# Patient Record
Sex: Female | Born: 1940 | Race: White | Hispanic: No | Marital: Married | Smoking: Never smoker
Health system: Southern US, Community
[De-identification: ages and names within clinical notes are randomized; demographics above are authoritative.]

## PROBLEM LIST (undated history)

## (undated) ENCOUNTER — Emergency Department (HOSPITAL_BASED_OUTPATIENT_CLINIC_OR_DEPARTMENT_OTHER): Admission: EM | Payer: Medicare Other | Source: Home / Self Care

## (undated) DIAGNOSIS — E7800 Pure hypercholesterolemia, unspecified: Secondary | ICD-10-CM

## (undated) DIAGNOSIS — R269 Unspecified abnormalities of gait and mobility: Secondary | ICD-10-CM

## (undated) DIAGNOSIS — E039 Hypothyroidism, unspecified: Secondary | ICD-10-CM

## (undated) DIAGNOSIS — I10 Essential (primary) hypertension: Secondary | ICD-10-CM

## (undated) DIAGNOSIS — R413 Other amnesia: Secondary | ICD-10-CM

## (undated) DIAGNOSIS — F324 Major depressive disorder, single episode, in partial remission: Secondary | ICD-10-CM

## (undated) DIAGNOSIS — M1990 Unspecified osteoarthritis, unspecified site: Secondary | ICD-10-CM

## (undated) DIAGNOSIS — F419 Anxiety disorder, unspecified: Secondary | ICD-10-CM

## (undated) HISTORY — PX: PARATHYROIDECTOMY: SHX19

---

## 1997-12-02 ENCOUNTER — Ambulatory Visit (HOSPITAL_COMMUNITY): Admission: RE | Admit: 1997-12-02 | Discharge: 1997-12-02 | Payer: Self-pay | Admitting: Obstetrics and Gynecology

## 1998-06-21 ENCOUNTER — Ambulatory Visit (HOSPITAL_COMMUNITY): Admission: RE | Admit: 1998-06-21 | Discharge: 1998-06-21 | Payer: Self-pay | Admitting: Obstetrics and Gynecology

## 1998-06-21 ENCOUNTER — Encounter: Payer: Self-pay | Admitting: Obstetrics and Gynecology

## 1999-07-28 ENCOUNTER — Ambulatory Visit (HOSPITAL_COMMUNITY): Admission: RE | Admit: 1999-07-28 | Discharge: 1999-07-28 | Payer: Self-pay | Admitting: Obstetrics and Gynecology

## 1999-07-28 ENCOUNTER — Encounter: Payer: Self-pay | Admitting: Obstetrics and Gynecology

## 1999-11-02 ENCOUNTER — Emergency Department (HOSPITAL_COMMUNITY): Admission: EM | Admit: 1999-11-02 | Discharge: 1999-11-02 | Payer: Self-pay | Admitting: Emergency Medicine

## 2000-06-27 ENCOUNTER — Ambulatory Visit (HOSPITAL_COMMUNITY): Admission: RE | Admit: 2000-06-27 | Discharge: 2000-06-27 | Payer: Self-pay | Admitting: *Deleted

## 2000-06-27 ENCOUNTER — Encounter: Payer: Self-pay | Admitting: Rheumatology

## 2001-01-01 ENCOUNTER — Ambulatory Visit (HOSPITAL_COMMUNITY): Admission: RE | Admit: 2001-01-01 | Discharge: 2001-01-01 | Payer: Self-pay | Admitting: Obstetrics and Gynecology

## 2001-01-01 ENCOUNTER — Encounter: Payer: Self-pay | Admitting: Obstetrics and Gynecology

## 2001-04-22 ENCOUNTER — Ambulatory Visit (HOSPITAL_COMMUNITY): Admission: RE | Admit: 2001-04-22 | Discharge: 2001-04-22 | Payer: Self-pay | Admitting: Gastroenterology

## 2002-02-28 ENCOUNTER — Encounter: Payer: Self-pay | Admitting: Obstetrics and Gynecology

## 2002-02-28 ENCOUNTER — Ambulatory Visit (HOSPITAL_COMMUNITY): Admission: RE | Admit: 2002-02-28 | Discharge: 2002-02-28 | Payer: Self-pay | Admitting: Gynecology

## 2004-06-17 ENCOUNTER — Other Ambulatory Visit: Admission: RE | Admit: 2004-06-17 | Discharge: 2004-06-17 | Payer: Self-pay | Admitting: Obstetrics and Gynecology

## 2005-10-10 ENCOUNTER — Other Ambulatory Visit: Admission: RE | Admit: 2005-10-10 | Discharge: 2005-10-10 | Payer: Self-pay | Admitting: Obstetrics and Gynecology

## 2006-11-12 ENCOUNTER — Other Ambulatory Visit: Admission: RE | Admit: 2006-11-12 | Discharge: 2006-11-12 | Payer: Self-pay | Admitting: Obstetrics and Gynecology

## 2008-12-23 ENCOUNTER — Ambulatory Visit (HOSPITAL_BASED_OUTPATIENT_CLINIC_OR_DEPARTMENT_OTHER): Admission: RE | Admit: 2008-12-23 | Discharge: 2008-12-23 | Payer: Self-pay | Admitting: Family Medicine

## 2008-12-23 ENCOUNTER — Ambulatory Visit: Payer: Self-pay | Admitting: Diagnostic Radiology

## 2009-09-01 ENCOUNTER — Ambulatory Visit: Payer: Self-pay | Admitting: Diagnostic Radiology

## 2009-09-01 ENCOUNTER — Emergency Department (HOSPITAL_BASED_OUTPATIENT_CLINIC_OR_DEPARTMENT_OTHER): Admission: EM | Admit: 2009-09-01 | Discharge: 2009-09-01 | Payer: Self-pay | Admitting: Emergency Medicine

## 2010-08-25 ENCOUNTER — Ambulatory Visit: Payer: Self-pay | Admitting: Psychology

## 2010-09-02 ENCOUNTER — Ambulatory Visit: Payer: Self-pay | Admitting: Psychology

## 2010-09-09 ENCOUNTER — Ambulatory Visit: Payer: Self-pay | Admitting: Psychology

## 2010-09-15 ENCOUNTER — Ambulatory Visit: Payer: Self-pay | Admitting: Psychology

## 2010-10-04 ENCOUNTER — Ambulatory Visit: Admit: 2010-10-04 | Payer: Self-pay | Admitting: Psychology

## 2011-02-10 NOTE — Procedures (Signed)
[facility]  Patient:    Tiffany Gill, Tiffany Gill                       MRN: [id number] Proc. Date: 04/22/01 Attending:  Fayrene Fearing L. Randa Evens, M.D. CC:         Anna Genre. Little, M.D.   Procedure Report  PROCEDURE:  Colonoscopy.  MEDICATIONS:  Fentanyl 175 mcg, Versed 10 mg IV.  SCOPE:  Pediatric video colonoscope.  INDICATIONS FOR PROCEDURE:  Strong family history of colon cancer.  DESCRIPTION OF PROCEDURE:  The procedure had been explained to the patient and consent obtained. With the patient in the left lateral decubitus position, a digital exam was performed and the Olympus pediatric video colonoscope was inserted and advanced under direct visualization. The prep was excellent. We were able to advance around to the cecum using abdominal pressure and position changes. I finally reached the cecum with the patient in the right lateral decubitus position. The ileocecal valve and appendiceal orifice were seen. The scope was withdrawn and the cecum, ascending colon, hepatic flexure, transverse colon, splenic flexure, and descending colon were seen well upon removal. No polyps or other lesions were seen. The rectum was free of lesions, there were minimal internal hemorrhoids.  ASSESSMENT:  No evidence of polyps.  PLAN:  Due to her strong family history of colon cancer will recommend repeating this in five years. DD:  04/22/01 TD:  04/22/01 Job: [id number]

## 2011-10-03 DIAGNOSIS — Q762 Congenital spondylolisthesis: Secondary | ICD-10-CM | POA: Diagnosis not present

## 2011-10-03 DIAGNOSIS — M545 Low back pain: Secondary | ICD-10-CM | POA: Diagnosis not present

## 2011-10-09 DIAGNOSIS — M8448XA Pathological fracture, other site, initial encounter for fracture: Secondary | ICD-10-CM | POA: Diagnosis not present

## 2011-10-09 DIAGNOSIS — M47817 Spondylosis without myelopathy or radiculopathy, lumbosacral region: Secondary | ICD-10-CM | POA: Diagnosis not present

## 2011-10-09 DIAGNOSIS — M545 Low back pain: Secondary | ICD-10-CM | POA: Diagnosis not present

## 2011-10-09 DIAGNOSIS — M4310 Spondylolisthesis, site unspecified: Secondary | ICD-10-CM | POA: Diagnosis not present

## 2011-10-10 DIAGNOSIS — M545 Low back pain: Secondary | ICD-10-CM | POA: Diagnosis not present

## 2011-10-12 DIAGNOSIS — M545 Low back pain: Secondary | ICD-10-CM | POA: Diagnosis not present

## 2011-10-18 DIAGNOSIS — Q762 Congenital spondylolisthesis: Secondary | ICD-10-CM | POA: Diagnosis not present

## 2011-10-18 DIAGNOSIS — M545 Low back pain: Secondary | ICD-10-CM | POA: Diagnosis not present

## 2011-10-25 DIAGNOSIS — M5137 Other intervertebral disc degeneration, lumbosacral region: Secondary | ICD-10-CM | POA: Diagnosis not present

## 2011-10-25 DIAGNOSIS — M545 Low back pain: Secondary | ICD-10-CM | POA: Diagnosis not present

## 2011-10-25 DIAGNOSIS — M47817 Spondylosis without myelopathy or radiculopathy, lumbosacral region: Secondary | ICD-10-CM | POA: Diagnosis not present

## 2011-10-25 DIAGNOSIS — M810 Age-related osteoporosis without current pathological fracture: Secondary | ICD-10-CM | POA: Diagnosis not present

## 2011-10-26 DIAGNOSIS — M545 Low back pain: Secondary | ICD-10-CM | POA: Diagnosis not present

## 2011-10-31 DIAGNOSIS — M1710 Unilateral primary osteoarthritis, unspecified knee: Secondary | ICD-10-CM | POA: Diagnosis not present

## 2011-10-31 DIAGNOSIS — M25559 Pain in unspecified hip: Secondary | ICD-10-CM | POA: Diagnosis not present

## 2011-11-02 DIAGNOSIS — M5137 Other intervertebral disc degeneration, lumbosacral region: Secondary | ICD-10-CM | POA: Diagnosis not present

## 2011-11-02 DIAGNOSIS — M47817 Spondylosis without myelopathy or radiculopathy, lumbosacral region: Secondary | ICD-10-CM | POA: Diagnosis not present

## 2011-11-03 DIAGNOSIS — F331 Major depressive disorder, recurrent, moderate: Secondary | ICD-10-CM | POA: Diagnosis not present

## 2011-11-14 DIAGNOSIS — IMO0002 Reserved for concepts with insufficient information to code with codable children: Secondary | ICD-10-CM | POA: Diagnosis not present

## 2011-11-14 DIAGNOSIS — M47817 Spondylosis without myelopathy or radiculopathy, lumbosacral region: Secondary | ICD-10-CM | POA: Diagnosis not present

## 2011-11-14 DIAGNOSIS — M5137 Other intervertebral disc degeneration, lumbosacral region: Secondary | ICD-10-CM | POA: Diagnosis not present

## 2011-11-14 DIAGNOSIS — IMO0001 Reserved for inherently not codable concepts without codable children: Secondary | ICD-10-CM | POA: Diagnosis not present

## 2011-11-14 DIAGNOSIS — M76899 Other specified enthesopathies of unspecified lower limb, excluding foot: Secondary | ICD-10-CM | POA: Diagnosis not present

## 2011-11-16 DIAGNOSIS — I10 Essential (primary) hypertension: Secondary | ICD-10-CM | POA: Diagnosis not present

## 2011-11-22 DIAGNOSIS — M47817 Spondylosis without myelopathy or radiculopathy, lumbosacral region: Secondary | ICD-10-CM | POA: Diagnosis not present

## 2011-11-22 DIAGNOSIS — IMO0002 Reserved for concepts with insufficient information to code with codable children: Secondary | ICD-10-CM | POA: Diagnosis not present

## 2011-11-22 DIAGNOSIS — M5137 Other intervertebral disc degeneration, lumbosacral region: Secondary | ICD-10-CM | POA: Diagnosis not present

## 2011-11-22 DIAGNOSIS — S239XXA Sprain of unspecified parts of thorax, initial encounter: Secondary | ICD-10-CM | POA: Diagnosis not present

## 2011-12-11 DIAGNOSIS — IMO0002 Reserved for concepts with insufficient information to code with codable children: Secondary | ICD-10-CM | POA: Diagnosis not present

## 2011-12-11 DIAGNOSIS — M5137 Other intervertebral disc degeneration, lumbosacral region: Secondary | ICD-10-CM | POA: Diagnosis not present

## 2011-12-11 DIAGNOSIS — M810 Age-related osteoporosis without current pathological fracture: Secondary | ICD-10-CM | POA: Diagnosis not present

## 2011-12-13 DIAGNOSIS — F332 Major depressive disorder, recurrent severe without psychotic features: Secondary | ICD-10-CM | POA: Diagnosis not present

## 2011-12-14 DIAGNOSIS — M546 Pain in thoracic spine: Secondary | ICD-10-CM | POA: Diagnosis not present

## 2011-12-14 DIAGNOSIS — M8448XA Pathological fracture, other site, initial encounter for fracture: Secondary | ICD-10-CM | POA: Diagnosis not present

## 2011-12-14 DIAGNOSIS — M549 Dorsalgia, unspecified: Secondary | ICD-10-CM | POA: Diagnosis not present

## 2011-12-22 DIAGNOSIS — M4120 Other idiopathic scoliosis, site unspecified: Secondary | ICD-10-CM | POA: Diagnosis not present

## 2011-12-22 DIAGNOSIS — Z79899 Other long term (current) drug therapy: Secondary | ICD-10-CM | POA: Diagnosis not present

## 2011-12-22 DIAGNOSIS — M439 Deforming dorsopathy, unspecified: Secondary | ICD-10-CM | POA: Diagnosis not present

## 2011-12-22 DIAGNOSIS — Z01818 Encounter for other preprocedural examination: Secondary | ICD-10-CM | POA: Diagnosis not present

## 2011-12-22 DIAGNOSIS — F332 Major depressive disorder, recurrent severe without psychotic features: Secondary | ICD-10-CM | POA: Diagnosis not present

## 2011-12-25 DIAGNOSIS — F332 Major depressive disorder, recurrent severe without psychotic features: Secondary | ICD-10-CM | POA: Diagnosis not present

## 2011-12-27 DIAGNOSIS — F332 Major depressive disorder, recurrent severe without psychotic features: Secondary | ICD-10-CM | POA: Diagnosis not present

## 2011-12-29 DIAGNOSIS — F332 Major depressive disorder, recurrent severe without psychotic features: Secondary | ICD-10-CM | POA: Diagnosis not present

## 2012-01-01 DIAGNOSIS — I10 Essential (primary) hypertension: Secondary | ICD-10-CM | POA: Diagnosis not present

## 2012-01-01 DIAGNOSIS — A879 Viral meningitis, unspecified: Secondary | ICD-10-CM | POA: Diagnosis not present

## 2012-01-01 DIAGNOSIS — G319 Degenerative disease of nervous system, unspecified: Secondary | ICD-10-CM | POA: Diagnosis not present

## 2012-01-01 DIAGNOSIS — F329 Major depressive disorder, single episode, unspecified: Secondary | ICD-10-CM | POA: Diagnosis present

## 2012-01-01 DIAGNOSIS — R4182 Altered mental status, unspecified: Secondary | ICD-10-CM | POA: Diagnosis not present

## 2012-01-01 DIAGNOSIS — R5383 Other fatigue: Secondary | ICD-10-CM | POA: Diagnosis not present

## 2012-01-01 DIAGNOSIS — Z7982 Long term (current) use of aspirin: Secondary | ICD-10-CM | POA: Diagnosis not present

## 2012-01-01 DIAGNOSIS — R079 Chest pain, unspecified: Secondary | ICD-10-CM | POA: Diagnosis not present

## 2012-01-01 DIAGNOSIS — I6789 Other cerebrovascular disease: Secondary | ICD-10-CM | POA: Diagnosis not present

## 2012-01-01 DIAGNOSIS — F29 Unspecified psychosis not due to a substance or known physiological condition: Secondary | ICD-10-CM | POA: Diagnosis not present

## 2012-01-01 DIAGNOSIS — E039 Hypothyroidism, unspecified: Secondary | ICD-10-CM | POA: Diagnosis not present

## 2012-01-01 DIAGNOSIS — F411 Generalized anxiety disorder: Secondary | ICD-10-CM | POA: Diagnosis not present

## 2012-01-01 DIAGNOSIS — M1990 Unspecified osteoarthritis, unspecified site: Secondary | ICD-10-CM | POA: Diagnosis present

## 2012-01-01 DIAGNOSIS — R52 Pain, unspecified: Secondary | ICD-10-CM | POA: Diagnosis not present

## 2012-01-01 DIAGNOSIS — R5381 Other malaise: Secondary | ICD-10-CM | POA: Diagnosis not present

## 2012-01-01 DIAGNOSIS — G589 Mononeuropathy, unspecified: Secondary | ICD-10-CM | POA: Diagnosis not present

## 2012-01-01 DIAGNOSIS — E871 Hypo-osmolality and hyponatremia: Secondary | ICD-10-CM | POA: Diagnosis not present

## 2012-01-01 DIAGNOSIS — Z79899 Other long term (current) drug therapy: Secondary | ICD-10-CM | POA: Diagnosis not present

## 2012-01-01 DIAGNOSIS — R51 Headache: Secondary | ICD-10-CM | POA: Diagnosis not present

## 2012-01-01 DIAGNOSIS — M542 Cervicalgia: Secondary | ICD-10-CM | POA: Diagnosis not present

## 2012-01-01 DIAGNOSIS — G4453 Primary thunderclap headache: Secondary | ICD-10-CM | POA: Diagnosis not present

## 2012-01-05 DIAGNOSIS — F332 Major depressive disorder, recurrent severe without psychotic features: Secondary | ICD-10-CM | POA: Diagnosis not present

## 2012-01-08 DIAGNOSIS — F332 Major depressive disorder, recurrent severe without psychotic features: Secondary | ICD-10-CM | POA: Diagnosis not present

## 2012-01-12 DIAGNOSIS — F332 Major depressive disorder, recurrent severe without psychotic features: Secondary | ICD-10-CM | POA: Diagnosis not present

## 2012-01-15 DIAGNOSIS — F332 Major depressive disorder, recurrent severe without psychotic features: Secondary | ICD-10-CM | POA: Diagnosis not present

## 2012-01-16 DIAGNOSIS — F329 Major depressive disorder, single episode, unspecified: Secondary | ICD-10-CM | POA: Diagnosis not present

## 2012-01-16 DIAGNOSIS — F332 Major depressive disorder, recurrent severe without psychotic features: Secondary | ICD-10-CM | POA: Diagnosis not present

## 2012-01-24 DIAGNOSIS — F332 Major depressive disorder, recurrent severe without psychotic features: Secondary | ICD-10-CM | POA: Diagnosis not present

## 2012-01-26 DIAGNOSIS — F332 Major depressive disorder, recurrent severe without psychotic features: Secondary | ICD-10-CM | POA: Diagnosis not present

## 2012-01-29 DIAGNOSIS — F332 Major depressive disorder, recurrent severe without psychotic features: Secondary | ICD-10-CM | POA: Diagnosis not present

## 2012-01-31 DIAGNOSIS — F332 Major depressive disorder, recurrent severe without psychotic features: Secondary | ICD-10-CM | POA: Diagnosis not present

## 2012-02-01 DIAGNOSIS — M5137 Other intervertebral disc degeneration, lumbosacral region: Secondary | ICD-10-CM | POA: Diagnosis not present

## 2012-02-01 DIAGNOSIS — M47817 Spondylosis without myelopathy or radiculopathy, lumbosacral region: Secondary | ICD-10-CM | POA: Diagnosis not present

## 2012-02-02 DIAGNOSIS — F332 Major depressive disorder, recurrent severe without psychotic features: Secondary | ICD-10-CM | POA: Diagnosis not present

## 2012-02-05 DIAGNOSIS — F332 Major depressive disorder, recurrent severe without psychotic features: Secondary | ICD-10-CM | POA: Diagnosis not present

## 2012-02-08 DIAGNOSIS — F331 Major depressive disorder, recurrent, moderate: Secondary | ICD-10-CM | POA: Diagnosis not present

## 2012-02-09 DIAGNOSIS — F332 Major depressive disorder, recurrent severe without psychotic features: Secondary | ICD-10-CM | POA: Diagnosis not present

## 2012-02-12 DIAGNOSIS — F332 Major depressive disorder, recurrent severe without psychotic features: Secondary | ICD-10-CM | POA: Diagnosis not present

## 2012-02-15 ENCOUNTER — Ambulatory Visit: Payer: Self-pay | Admitting: Psychology

## 2012-02-16 DIAGNOSIS — F332 Major depressive disorder, recurrent severe without psychotic features: Secondary | ICD-10-CM | POA: Diagnosis not present

## 2012-02-21 DIAGNOSIS — F332 Major depressive disorder, recurrent severe without psychotic features: Secondary | ICD-10-CM | POA: Diagnosis not present

## 2012-02-22 DIAGNOSIS — F331 Major depressive disorder, recurrent, moderate: Secondary | ICD-10-CM | POA: Diagnosis not present

## 2012-02-26 DIAGNOSIS — F332 Major depressive disorder, recurrent severe without psychotic features: Secondary | ICD-10-CM | POA: Diagnosis not present

## 2012-02-28 ENCOUNTER — Ambulatory Visit (INDEPENDENT_AMBULATORY_CARE_PROVIDER_SITE_OTHER): Payer: Managed Care, Other (non HMO) | Admitting: Psychology

## 2012-02-28 DIAGNOSIS — F331 Major depressive disorder, recurrent, moderate: Secondary | ICD-10-CM

## 2012-02-29 DIAGNOSIS — E039 Hypothyroidism, unspecified: Secondary | ICD-10-CM | POA: Diagnosis not present

## 2012-02-29 DIAGNOSIS — I10 Essential (primary) hypertension: Secondary | ICD-10-CM | POA: Diagnosis not present

## 2012-03-01 DIAGNOSIS — F332 Major depressive disorder, recurrent severe without psychotic features: Secondary | ICD-10-CM | POA: Diagnosis not present

## 2012-03-06 DIAGNOSIS — F332 Major depressive disorder, recurrent severe without psychotic features: Secondary | ICD-10-CM | POA: Diagnosis not present

## 2012-03-10 DIAGNOSIS — F29 Unspecified psychosis not due to a substance or known physiological condition: Secondary | ICD-10-CM | POA: Diagnosis not present

## 2012-03-10 DIAGNOSIS — F332 Major depressive disorder, recurrent severe without psychotic features: Secondary | ICD-10-CM | POA: Diagnosis not present

## 2012-03-10 DIAGNOSIS — I6789 Other cerebrovascular disease: Secondary | ICD-10-CM | POA: Diagnosis not present

## 2012-03-10 DIAGNOSIS — G3184 Mild cognitive impairment, so stated: Secondary | ICD-10-CM | POA: Diagnosis not present

## 2012-03-10 DIAGNOSIS — F0280 Dementia in other diseases classified elsewhere without behavioral disturbance: Secondary | ICD-10-CM | POA: Diagnosis not present

## 2012-03-10 DIAGNOSIS — R4182 Altered mental status, unspecified: Secondary | ICD-10-CM | POA: Diagnosis not present

## 2012-03-10 DIAGNOSIS — R5381 Other malaise: Secondary | ICD-10-CM | POA: Diagnosis not present

## 2012-03-10 DIAGNOSIS — T4271XA Poisoning by unspecified antiepileptic and sedative-hypnotic drugs, accidental (unintentional), initial encounter: Secondary | ICD-10-CM | POA: Diagnosis not present

## 2012-03-12 ENCOUNTER — Ambulatory Visit (INDEPENDENT_AMBULATORY_CARE_PROVIDER_SITE_OTHER): Payer: Managed Care, Other (non HMO) | Admitting: Psychology

## 2012-03-12 DIAGNOSIS — F331 Major depressive disorder, recurrent, moderate: Secondary | ICD-10-CM

## 2012-03-13 DIAGNOSIS — F332 Major depressive disorder, recurrent severe without psychotic features: Secondary | ICD-10-CM | POA: Diagnosis not present

## 2012-03-14 DIAGNOSIS — F339 Major depressive disorder, recurrent, unspecified: Secondary | ICD-10-CM | POA: Diagnosis not present

## 2012-03-14 DIAGNOSIS — E876 Hypokalemia: Secondary | ICD-10-CM | POA: Diagnosis not present

## 2012-03-19 ENCOUNTER — Ambulatory Visit (INDEPENDENT_AMBULATORY_CARE_PROVIDER_SITE_OTHER): Payer: Managed Care, Other (non HMO) | Admitting: Psychology

## 2012-03-19 DIAGNOSIS — F331 Major depressive disorder, recurrent, moderate: Secondary | ICD-10-CM | POA: Diagnosis not present

## 2012-03-20 DIAGNOSIS — F332 Major depressive disorder, recurrent severe without psychotic features: Secondary | ICD-10-CM | POA: Diagnosis not present

## 2012-03-26 ENCOUNTER — Ambulatory Visit: Payer: Managed Care, Other (non HMO) | Admitting: Psychology

## 2012-03-31 DIAGNOSIS — E876 Hypokalemia: Secondary | ICD-10-CM | POA: Diagnosis not present

## 2012-03-31 DIAGNOSIS — J069 Acute upper respiratory infection, unspecified: Secondary | ICD-10-CM | POA: Diagnosis not present

## 2012-04-02 ENCOUNTER — Ambulatory Visit (INDEPENDENT_AMBULATORY_CARE_PROVIDER_SITE_OTHER): Payer: Managed Care, Other (non HMO) | Admitting: Psychology

## 2012-04-02 DIAGNOSIS — F331 Major depressive disorder, recurrent, moderate: Secondary | ICD-10-CM

## 2012-04-02 DIAGNOSIS — E876 Hypokalemia: Secondary | ICD-10-CM | POA: Diagnosis not present

## 2012-04-02 DIAGNOSIS — R05 Cough: Secondary | ICD-10-CM | POA: Diagnosis not present

## 2012-04-03 DIAGNOSIS — F332 Major depressive disorder, recurrent severe without psychotic features: Secondary | ICD-10-CM | POA: Diagnosis not present

## 2012-04-03 DIAGNOSIS — Z79899 Other long term (current) drug therapy: Secondary | ICD-10-CM | POA: Diagnosis not present

## 2012-04-10 DIAGNOSIS — R0602 Shortness of breath: Secondary | ICD-10-CM | POA: Diagnosis not present

## 2012-04-10 DIAGNOSIS — I059 Rheumatic mitral valve disease, unspecified: Secondary | ICD-10-CM | POA: Diagnosis not present

## 2012-04-11 DIAGNOSIS — F331 Major depressive disorder, recurrent, moderate: Secondary | ICD-10-CM | POA: Diagnosis not present

## 2012-04-15 DIAGNOSIS — I059 Rheumatic mitral valve disease, unspecified: Secondary | ICD-10-CM | POA: Diagnosis not present

## 2012-04-15 DIAGNOSIS — H524 Presbyopia: Secondary | ICD-10-CM | POA: Diagnosis not present

## 2012-04-15 DIAGNOSIS — I10 Essential (primary) hypertension: Secondary | ICD-10-CM | POA: Diagnosis not present

## 2012-04-15 DIAGNOSIS — H02839 Dermatochalasis of unspecified eye, unspecified eyelid: Secondary | ICD-10-CM | POA: Diagnosis not present

## 2012-04-15 DIAGNOSIS — I359 Nonrheumatic aortic valve disorder, unspecified: Secondary | ICD-10-CM | POA: Diagnosis not present

## 2012-04-15 DIAGNOSIS — I369 Nonrheumatic tricuspid valve disorder, unspecified: Secondary | ICD-10-CM | POA: Diagnosis not present

## 2012-04-15 DIAGNOSIS — H2510 Age-related nuclear cataract, unspecified eye: Secondary | ICD-10-CM | POA: Diagnosis not present

## 2012-04-16 ENCOUNTER — Ambulatory Visit (INDEPENDENT_AMBULATORY_CARE_PROVIDER_SITE_OTHER): Payer: Managed Care, Other (non HMO) | Admitting: Psychology

## 2012-04-16 DIAGNOSIS — F331 Major depressive disorder, recurrent, moderate: Secondary | ICD-10-CM | POA: Diagnosis not present

## 2012-04-18 DIAGNOSIS — R55 Syncope and collapse: Secondary | ICD-10-CM | POA: Diagnosis not present

## 2012-04-18 DIAGNOSIS — M1710 Unilateral primary osteoarthritis, unspecified knee: Secondary | ICD-10-CM | POA: Diagnosis not present

## 2012-04-18 DIAGNOSIS — M546 Pain in thoracic spine: Secondary | ICD-10-CM | POA: Diagnosis not present

## 2012-04-18 DIAGNOSIS — M5137 Other intervertebral disc degeneration, lumbosacral region: Secondary | ICD-10-CM | POA: Diagnosis not present

## 2012-04-18 DIAGNOSIS — M545 Low back pain: Secondary | ICD-10-CM | POA: Diagnosis not present

## 2012-04-18 DIAGNOSIS — F329 Major depressive disorder, single episode, unspecified: Secondary | ICD-10-CM | POA: Diagnosis not present

## 2012-04-18 DIAGNOSIS — S22009A Unspecified fracture of unspecified thoracic vertebra, initial encounter for closed fracture: Secondary | ICD-10-CM | POA: Diagnosis not present

## 2012-04-18 DIAGNOSIS — M47817 Spondylosis without myelopathy or radiculopathy, lumbosacral region: Secondary | ICD-10-CM | POA: Diagnosis not present

## 2012-04-29 DIAGNOSIS — Z79899 Other long term (current) drug therapy: Secondary | ICD-10-CM | POA: Diagnosis not present

## 2012-04-29 DIAGNOSIS — F333 Major depressive disorder, recurrent, severe with psychotic symptoms: Secondary | ICD-10-CM | POA: Diagnosis not present

## 2012-04-29 DIAGNOSIS — F329 Major depressive disorder, single episode, unspecified: Secondary | ICD-10-CM | POA: Diagnosis not present

## 2012-04-30 ENCOUNTER — Ambulatory Visit (INDEPENDENT_AMBULATORY_CARE_PROVIDER_SITE_OTHER): Payer: Managed Care, Other (non HMO) | Admitting: Psychology

## 2012-04-30 DIAGNOSIS — F331 Major depressive disorder, recurrent, moderate: Secondary | ICD-10-CM | POA: Diagnosis not present

## 2012-05-02 DIAGNOSIS — M1710 Unilateral primary osteoarthritis, unspecified knee: Secondary | ICD-10-CM | POA: Diagnosis not present

## 2012-05-09 DIAGNOSIS — M1710 Unilateral primary osteoarthritis, unspecified knee: Secondary | ICD-10-CM | POA: Diagnosis not present

## 2012-05-13 DIAGNOSIS — F331 Major depressive disorder, recurrent, moderate: Secondary | ICD-10-CM | POA: Diagnosis not present

## 2012-05-14 ENCOUNTER — Ambulatory Visit (INDEPENDENT_AMBULATORY_CARE_PROVIDER_SITE_OTHER): Payer: Managed Care, Other (non HMO) | Admitting: Psychology

## 2012-05-14 DIAGNOSIS — F331 Major depressive disorder, recurrent, moderate: Secondary | ICD-10-CM

## 2012-05-16 DIAGNOSIS — M1710 Unilateral primary osteoarthritis, unspecified knee: Secondary | ICD-10-CM | POA: Diagnosis not present

## 2012-05-16 DIAGNOSIS — M47817 Spondylosis without myelopathy or radiculopathy, lumbosacral region: Secondary | ICD-10-CM | POA: Diagnosis not present

## 2012-05-16 DIAGNOSIS — M5137 Other intervertebral disc degeneration, lumbosacral region: Secondary | ICD-10-CM | POA: Diagnosis not present

## 2012-05-16 DIAGNOSIS — M810 Age-related osteoporosis without current pathological fracture: Secondary | ICD-10-CM | POA: Diagnosis not present

## 2012-05-20 DIAGNOSIS — F329 Major depressive disorder, single episode, unspecified: Secondary | ICD-10-CM | POA: Diagnosis not present

## 2012-05-20 DIAGNOSIS — Z79899 Other long term (current) drug therapy: Secondary | ICD-10-CM | POA: Diagnosis not present

## 2012-05-20 DIAGNOSIS — F332 Major depressive disorder, recurrent severe without psychotic features: Secondary | ICD-10-CM | POA: Diagnosis not present

## 2012-05-24 DIAGNOSIS — M47817 Spondylosis without myelopathy or radiculopathy, lumbosacral region: Secondary | ICD-10-CM | POA: Diagnosis not present

## 2012-05-24 DIAGNOSIS — M5137 Other intervertebral disc degeneration, lumbosacral region: Secondary | ICD-10-CM | POA: Diagnosis not present

## 2012-05-28 ENCOUNTER — Ambulatory Visit: Payer: Managed Care, Other (non HMO) | Admitting: Psychology

## 2012-06-11 ENCOUNTER — Ambulatory Visit: Payer: Managed Care, Other (non HMO) | Admitting: Psychology

## 2012-06-12 DIAGNOSIS — M810 Age-related osteoporosis without current pathological fracture: Secondary | ICD-10-CM | POA: Diagnosis not present

## 2012-06-12 DIAGNOSIS — M545 Low back pain: Secondary | ICD-10-CM | POA: Diagnosis not present

## 2012-06-12 DIAGNOSIS — S22009A Unspecified fracture of unspecified thoracic vertebra, initial encounter for closed fracture: Secondary | ICD-10-CM | POA: Diagnosis not present

## 2012-06-13 DIAGNOSIS — M810 Age-related osteoporosis without current pathological fracture: Secondary | ICD-10-CM | POA: Diagnosis not present

## 2012-06-13 DIAGNOSIS — E785 Hyperlipidemia, unspecified: Secondary | ICD-10-CM | POA: Diagnosis not present

## 2012-06-17 DIAGNOSIS — F329 Major depressive disorder, single episode, unspecified: Secondary | ICD-10-CM | POA: Diagnosis not present

## 2012-06-20 DIAGNOSIS — F331 Major depressive disorder, recurrent, moderate: Secondary | ICD-10-CM | POA: Diagnosis not present

## 2012-06-25 ENCOUNTER — Ambulatory Visit: Payer: Managed Care, Other (non HMO) | Admitting: Psychology

## 2012-06-26 DIAGNOSIS — R05 Cough: Secondary | ICD-10-CM | POA: Diagnosis not present

## 2012-06-26 DIAGNOSIS — Z23 Encounter for immunization: Secondary | ICD-10-CM | POA: Diagnosis not present

## 2012-06-26 DIAGNOSIS — J069 Acute upper respiratory infection, unspecified: Secondary | ICD-10-CM | POA: Diagnosis not present

## 2012-06-26 DIAGNOSIS — E876 Hypokalemia: Secondary | ICD-10-CM | POA: Diagnosis not present

## 2012-06-26 DIAGNOSIS — E039 Hypothyroidism, unspecified: Secondary | ICD-10-CM | POA: Diagnosis not present

## 2012-07-09 ENCOUNTER — Ambulatory Visit: Payer: Managed Care, Other (non HMO) | Admitting: Psychology

## 2012-07-11 DIAGNOSIS — E559 Vitamin D deficiency, unspecified: Secondary | ICD-10-CM | POA: Diagnosis not present

## 2012-07-11 DIAGNOSIS — M810 Age-related osteoporosis without current pathological fracture: Secondary | ICD-10-CM | POA: Diagnosis not present

## 2012-07-19 DIAGNOSIS — F331 Major depressive disorder, recurrent, moderate: Secondary | ICD-10-CM | POA: Diagnosis not present

## 2012-07-22 DIAGNOSIS — F332 Major depressive disorder, recurrent severe without psychotic features: Secondary | ICD-10-CM | POA: Diagnosis not present

## 2012-07-22 DIAGNOSIS — F329 Major depressive disorder, single episode, unspecified: Secondary | ICD-10-CM | POA: Diagnosis not present

## 2012-07-23 ENCOUNTER — Ambulatory Visit (INDEPENDENT_AMBULATORY_CARE_PROVIDER_SITE_OTHER): Payer: Managed Care, Other (non HMO) | Admitting: Psychology

## 2012-07-23 DIAGNOSIS — F331 Major depressive disorder, recurrent, moderate: Secondary | ICD-10-CM

## 2012-07-26 DIAGNOSIS — F331 Major depressive disorder, recurrent, moderate: Secondary | ICD-10-CM | POA: Diagnosis not present

## 2012-08-12 DIAGNOSIS — Z1331 Encounter for screening for depression: Secondary | ICD-10-CM | POA: Diagnosis not present

## 2012-08-12 DIAGNOSIS — R05 Cough: Secondary | ICD-10-CM | POA: Diagnosis not present

## 2012-08-16 DIAGNOSIS — M47817 Spondylosis without myelopathy or radiculopathy, lumbosacral region: Secondary | ICD-10-CM | POA: Diagnosis not present

## 2012-08-16 DIAGNOSIS — M810 Age-related osteoporosis without current pathological fracture: Secondary | ICD-10-CM | POA: Diagnosis not present

## 2012-08-16 DIAGNOSIS — M545 Low back pain: Secondary | ICD-10-CM | POA: Diagnosis not present

## 2012-08-16 DIAGNOSIS — M5137 Other intervertebral disc degeneration, lumbosacral region: Secondary | ICD-10-CM | POA: Diagnosis not present

## 2012-08-19 DIAGNOSIS — I10 Essential (primary) hypertension: Secondary | ICD-10-CM | POA: Diagnosis not present

## 2012-08-19 DIAGNOSIS — K219 Gastro-esophageal reflux disease without esophagitis: Secondary | ICD-10-CM | POA: Diagnosis not present

## 2012-08-19 DIAGNOSIS — E785 Hyperlipidemia, unspecified: Secondary | ICD-10-CM | POA: Diagnosis not present

## 2012-08-19 DIAGNOSIS — F329 Major depressive disorder, single episode, unspecified: Secondary | ICD-10-CM | POA: Diagnosis not present

## 2012-08-19 DIAGNOSIS — E079 Disorder of thyroid, unspecified: Secondary | ICD-10-CM | POA: Diagnosis not present

## 2012-08-19 DIAGNOSIS — I38 Endocarditis, valve unspecified: Secondary | ICD-10-CM | POA: Diagnosis not present

## 2012-08-19 DIAGNOSIS — M810 Age-related osteoporosis without current pathological fracture: Secondary | ICD-10-CM | POA: Diagnosis not present

## 2012-09-03 DIAGNOSIS — F331 Major depressive disorder, recurrent, moderate: Secondary | ICD-10-CM | POA: Diagnosis not present

## 2012-09-09 DIAGNOSIS — F329 Major depressive disorder, single episode, unspecified: Secondary | ICD-10-CM | POA: Diagnosis not present

## 2012-09-10 ENCOUNTER — Ambulatory Visit: Payer: Self-pay | Admitting: Psychology

## 2012-09-19 DIAGNOSIS — R05 Cough: Secondary | ICD-10-CM | POA: Diagnosis not present

## 2012-09-19 DIAGNOSIS — Z23 Encounter for immunization: Secondary | ICD-10-CM | POA: Diagnosis not present

## 2012-09-23 DIAGNOSIS — F329 Major depressive disorder, single episode, unspecified: Secondary | ICD-10-CM | POA: Diagnosis not present

## 2012-10-02 DIAGNOSIS — R5381 Other malaise: Secondary | ICD-10-CM | POA: Diagnosis not present

## 2012-10-02 DIAGNOSIS — IMO0001 Reserved for inherently not codable concepts without codable children: Secondary | ICD-10-CM | POA: Diagnosis not present

## 2012-10-07 DIAGNOSIS — F329 Major depressive disorder, single episode, unspecified: Secondary | ICD-10-CM | POA: Diagnosis not present

## 2012-10-08 DIAGNOSIS — F331 Major depressive disorder, recurrent, moderate: Secondary | ICD-10-CM | POA: Diagnosis not present

## 2012-10-17 ENCOUNTER — Ambulatory Visit (INDEPENDENT_AMBULATORY_CARE_PROVIDER_SITE_OTHER): Payer: Medicare Other | Admitting: Psychology

## 2012-10-17 DIAGNOSIS — F331 Major depressive disorder, recurrent, moderate: Secondary | ICD-10-CM | POA: Diagnosis not present

## 2012-10-22 DIAGNOSIS — F331 Major depressive disorder, recurrent, moderate: Secondary | ICD-10-CM | POA: Diagnosis not present

## 2012-10-28 DIAGNOSIS — F329 Major depressive disorder, single episode, unspecified: Secondary | ICD-10-CM | POA: Diagnosis not present

## 2012-11-01 ENCOUNTER — Ambulatory Visit (INDEPENDENT_AMBULATORY_CARE_PROVIDER_SITE_OTHER): Payer: Medicare Other | Admitting: Psychology

## 2012-11-01 DIAGNOSIS — F331 Major depressive disorder, recurrent, moderate: Secondary | ICD-10-CM | POA: Diagnosis not present

## 2012-11-04 DIAGNOSIS — M6281 Muscle weakness (generalized): Secondary | ICD-10-CM | POA: Diagnosis not present

## 2012-11-11 DIAGNOSIS — M6281 Muscle weakness (generalized): Secondary | ICD-10-CM | POA: Diagnosis not present

## 2012-11-11 DIAGNOSIS — R269 Unspecified abnormalities of gait and mobility: Secondary | ICD-10-CM | POA: Diagnosis not present

## 2012-11-12 DIAGNOSIS — F331 Major depressive disorder, recurrent, moderate: Secondary | ICD-10-CM | POA: Diagnosis not present

## 2012-11-14 DIAGNOSIS — R269 Unspecified abnormalities of gait and mobility: Secondary | ICD-10-CM | POA: Diagnosis not present

## 2012-11-14 DIAGNOSIS — M6281 Muscle weakness (generalized): Secondary | ICD-10-CM | POA: Diagnosis not present

## 2012-11-18 DIAGNOSIS — F332 Major depressive disorder, recurrent severe without psychotic features: Secondary | ICD-10-CM | POA: Diagnosis not present

## 2012-11-20 ENCOUNTER — Ambulatory Visit (INDEPENDENT_AMBULATORY_CARE_PROVIDER_SITE_OTHER): Payer: Medicare Other | Admitting: Psychology

## 2012-11-20 DIAGNOSIS — F331 Major depressive disorder, recurrent, moderate: Secondary | ICD-10-CM | POA: Diagnosis not present

## 2012-11-21 DIAGNOSIS — M6281 Muscle weakness (generalized): Secondary | ICD-10-CM | POA: Diagnosis not present

## 2012-11-21 DIAGNOSIS — R269 Unspecified abnormalities of gait and mobility: Secondary | ICD-10-CM | POA: Diagnosis not present

## 2012-11-28 DIAGNOSIS — M6281 Muscle weakness (generalized): Secondary | ICD-10-CM | POA: Diagnosis not present

## 2012-12-11 ENCOUNTER — Ambulatory Visit: Payer: Medicare Other | Admitting: Psychology

## 2012-12-13 DIAGNOSIS — R05 Cough: Secondary | ICD-10-CM | POA: Diagnosis not present

## 2012-12-16 DIAGNOSIS — F332 Major depressive disorder, recurrent severe without psychotic features: Secondary | ICD-10-CM | POA: Diagnosis not present

## 2012-12-18 DIAGNOSIS — J309 Allergic rhinitis, unspecified: Secondary | ICD-10-CM | POA: Diagnosis not present

## 2012-12-18 DIAGNOSIS — R05 Cough: Secondary | ICD-10-CM | POA: Diagnosis not present

## 2012-12-19 DIAGNOSIS — M5137 Other intervertebral disc degeneration, lumbosacral region: Secondary | ICD-10-CM | POA: Diagnosis not present

## 2012-12-19 DIAGNOSIS — M25559 Pain in unspecified hip: Secondary | ICD-10-CM | POA: Diagnosis not present

## 2012-12-19 DIAGNOSIS — M999 Biomechanical lesion, unspecified: Secondary | ICD-10-CM | POA: Diagnosis not present

## 2012-12-23 DIAGNOSIS — M1710 Unilateral primary osteoarthritis, unspecified knee: Secondary | ICD-10-CM | POA: Diagnosis not present

## 2012-12-26 DIAGNOSIS — M5137 Other intervertebral disc degeneration, lumbosacral region: Secondary | ICD-10-CM | POA: Diagnosis not present

## 2012-12-26 DIAGNOSIS — M25559 Pain in unspecified hip: Secondary | ICD-10-CM | POA: Diagnosis not present

## 2012-12-26 DIAGNOSIS — M999 Biomechanical lesion, unspecified: Secondary | ICD-10-CM | POA: Diagnosis not present

## 2012-12-30 DIAGNOSIS — M25559 Pain in unspecified hip: Secondary | ICD-10-CM | POA: Diagnosis not present

## 2012-12-30 DIAGNOSIS — M999 Biomechanical lesion, unspecified: Secondary | ICD-10-CM | POA: Diagnosis not present

## 2012-12-30 DIAGNOSIS — M5137 Other intervertebral disc degeneration, lumbosacral region: Secondary | ICD-10-CM | POA: Diagnosis not present

## 2012-12-31 DIAGNOSIS — M5137 Other intervertebral disc degeneration, lumbosacral region: Secondary | ICD-10-CM | POA: Diagnosis not present

## 2012-12-31 DIAGNOSIS — M25559 Pain in unspecified hip: Secondary | ICD-10-CM | POA: Diagnosis not present

## 2012-12-31 DIAGNOSIS — M999 Biomechanical lesion, unspecified: Secondary | ICD-10-CM | POA: Diagnosis not present

## 2013-01-02 DIAGNOSIS — M999 Biomechanical lesion, unspecified: Secondary | ICD-10-CM | POA: Diagnosis not present

## 2013-01-02 DIAGNOSIS — M25559 Pain in unspecified hip: Secondary | ICD-10-CM | POA: Diagnosis not present

## 2013-01-02 DIAGNOSIS — M5137 Other intervertebral disc degeneration, lumbosacral region: Secondary | ICD-10-CM | POA: Diagnosis not present

## 2013-01-06 DIAGNOSIS — M5137 Other intervertebral disc degeneration, lumbosacral region: Secondary | ICD-10-CM | POA: Diagnosis not present

## 2013-01-06 DIAGNOSIS — M999 Biomechanical lesion, unspecified: Secondary | ICD-10-CM | POA: Diagnosis not present

## 2013-01-06 DIAGNOSIS — M25559 Pain in unspecified hip: Secondary | ICD-10-CM | POA: Diagnosis not present

## 2013-01-07 DIAGNOSIS — M25559 Pain in unspecified hip: Secondary | ICD-10-CM | POA: Diagnosis not present

## 2013-01-07 DIAGNOSIS — M999 Biomechanical lesion, unspecified: Secondary | ICD-10-CM | POA: Diagnosis not present

## 2013-01-07 DIAGNOSIS — M5137 Other intervertebral disc degeneration, lumbosacral region: Secondary | ICD-10-CM | POA: Diagnosis not present

## 2013-01-09 DIAGNOSIS — M999 Biomechanical lesion, unspecified: Secondary | ICD-10-CM | POA: Diagnosis not present

## 2013-01-09 DIAGNOSIS — M25559 Pain in unspecified hip: Secondary | ICD-10-CM | POA: Diagnosis not present

## 2013-01-09 DIAGNOSIS — M5137 Other intervertebral disc degeneration, lumbosacral region: Secondary | ICD-10-CM | POA: Diagnosis not present

## 2013-01-13 DIAGNOSIS — F329 Major depressive disorder, single episode, unspecified: Secondary | ICD-10-CM | POA: Diagnosis not present

## 2013-01-13 DIAGNOSIS — I38 Endocarditis, valve unspecified: Secondary | ICD-10-CM | POA: Diagnosis not present

## 2013-01-13 DIAGNOSIS — I10 Essential (primary) hypertension: Secondary | ICD-10-CM | POA: Diagnosis not present

## 2013-01-13 DIAGNOSIS — F332 Major depressive disorder, recurrent severe without psychotic features: Secondary | ICD-10-CM | POA: Diagnosis not present

## 2013-01-15 DIAGNOSIS — M25559 Pain in unspecified hip: Secondary | ICD-10-CM | POA: Diagnosis not present

## 2013-01-15 DIAGNOSIS — M5137 Other intervertebral disc degeneration, lumbosacral region: Secondary | ICD-10-CM | POA: Diagnosis not present

## 2013-01-15 DIAGNOSIS — M999 Biomechanical lesion, unspecified: Secondary | ICD-10-CM | POA: Diagnosis not present

## 2013-01-16 DIAGNOSIS — M999 Biomechanical lesion, unspecified: Secondary | ICD-10-CM | POA: Diagnosis not present

## 2013-01-16 DIAGNOSIS — M5137 Other intervertebral disc degeneration, lumbosacral region: Secondary | ICD-10-CM | POA: Diagnosis not present

## 2013-01-16 DIAGNOSIS — M25559 Pain in unspecified hip: Secondary | ICD-10-CM | POA: Diagnosis not present

## 2013-01-20 DIAGNOSIS — R05 Cough: Secondary | ICD-10-CM | POA: Diagnosis not present

## 2013-01-20 DIAGNOSIS — M999 Biomechanical lesion, unspecified: Secondary | ICD-10-CM | POA: Diagnosis not present

## 2013-01-20 DIAGNOSIS — M25559 Pain in unspecified hip: Secondary | ICD-10-CM | POA: Diagnosis not present

## 2013-01-20 DIAGNOSIS — M5137 Other intervertebral disc degeneration, lumbosacral region: Secondary | ICD-10-CM | POA: Diagnosis not present

## 2013-01-21 DIAGNOSIS — M999 Biomechanical lesion, unspecified: Secondary | ICD-10-CM | POA: Diagnosis not present

## 2013-01-21 DIAGNOSIS — M25559 Pain in unspecified hip: Secondary | ICD-10-CM | POA: Diagnosis not present

## 2013-01-21 DIAGNOSIS — M5137 Other intervertebral disc degeneration, lumbosacral region: Secondary | ICD-10-CM | POA: Diagnosis not present

## 2013-01-23 DIAGNOSIS — M999 Biomechanical lesion, unspecified: Secondary | ICD-10-CM | POA: Diagnosis not present

## 2013-01-23 DIAGNOSIS — M5137 Other intervertebral disc degeneration, lumbosacral region: Secondary | ICD-10-CM | POA: Diagnosis not present

## 2013-01-23 DIAGNOSIS — M25559 Pain in unspecified hip: Secondary | ICD-10-CM | POA: Diagnosis not present

## 2013-01-28 DIAGNOSIS — M25559 Pain in unspecified hip: Secondary | ICD-10-CM | POA: Diagnosis not present

## 2013-01-28 DIAGNOSIS — M999 Biomechanical lesion, unspecified: Secondary | ICD-10-CM | POA: Diagnosis not present

## 2013-01-28 DIAGNOSIS — M5137 Other intervertebral disc degeneration, lumbosacral region: Secondary | ICD-10-CM | POA: Diagnosis not present

## 2013-01-30 DIAGNOSIS — M5137 Other intervertebral disc degeneration, lumbosacral region: Secondary | ICD-10-CM | POA: Diagnosis not present

## 2013-01-30 DIAGNOSIS — M999 Biomechanical lesion, unspecified: Secondary | ICD-10-CM | POA: Diagnosis not present

## 2013-01-30 DIAGNOSIS — M25559 Pain in unspecified hip: Secondary | ICD-10-CM | POA: Diagnosis not present

## 2013-02-03 DIAGNOSIS — E559 Vitamin D deficiency, unspecified: Secondary | ICD-10-CM | POA: Diagnosis not present

## 2013-02-03 DIAGNOSIS — M810 Age-related osteoporosis without current pathological fracture: Secondary | ICD-10-CM | POA: Diagnosis not present

## 2013-02-04 DIAGNOSIS — M999 Biomechanical lesion, unspecified: Secondary | ICD-10-CM | POA: Diagnosis not present

## 2013-02-04 DIAGNOSIS — M25559 Pain in unspecified hip: Secondary | ICD-10-CM | POA: Diagnosis not present

## 2013-02-04 DIAGNOSIS — M5137 Other intervertebral disc degeneration, lumbosacral region: Secondary | ICD-10-CM | POA: Diagnosis not present

## 2013-02-06 DIAGNOSIS — F3342 Major depressive disorder, recurrent, in full remission: Secondary | ICD-10-CM | POA: Diagnosis not present

## 2013-02-10 DIAGNOSIS — Z5181 Encounter for therapeutic drug level monitoring: Secondary | ICD-10-CM | POA: Diagnosis not present

## 2013-02-10 DIAGNOSIS — Z938 Other artificial opening status: Secondary | ICD-10-CM | POA: Diagnosis not present

## 2013-02-10 DIAGNOSIS — Z452 Encounter for adjustment and management of vascular access device: Secondary | ICD-10-CM | POA: Diagnosis not present

## 2013-02-10 DIAGNOSIS — Z79899 Other long term (current) drug therapy: Secondary | ICD-10-CM | POA: Diagnosis not present

## 2013-02-10 DIAGNOSIS — F3289 Other specified depressive episodes: Secondary | ICD-10-CM | POA: Diagnosis not present

## 2013-02-10 DIAGNOSIS — F329 Major depressive disorder, single episode, unspecified: Secondary | ICD-10-CM | POA: Diagnosis not present

## 2013-02-13 DIAGNOSIS — M999 Biomechanical lesion, unspecified: Secondary | ICD-10-CM | POA: Diagnosis not present

## 2013-02-13 DIAGNOSIS — M5137 Other intervertebral disc degeneration, lumbosacral region: Secondary | ICD-10-CM | POA: Diagnosis not present

## 2013-02-13 DIAGNOSIS — M25559 Pain in unspecified hip: Secondary | ICD-10-CM | POA: Diagnosis not present

## 2013-02-25 DIAGNOSIS — M25559 Pain in unspecified hip: Secondary | ICD-10-CM | POA: Diagnosis not present

## 2013-02-25 DIAGNOSIS — M999 Biomechanical lesion, unspecified: Secondary | ICD-10-CM | POA: Diagnosis not present

## 2013-02-25 DIAGNOSIS — M5137 Other intervertebral disc degeneration, lumbosacral region: Secondary | ICD-10-CM | POA: Diagnosis not present

## 2013-03-04 DIAGNOSIS — M999 Biomechanical lesion, unspecified: Secondary | ICD-10-CM | POA: Diagnosis not present

## 2013-03-04 DIAGNOSIS — M5137 Other intervertebral disc degeneration, lumbosacral region: Secondary | ICD-10-CM | POA: Diagnosis not present

## 2013-03-04 DIAGNOSIS — M25559 Pain in unspecified hip: Secondary | ICD-10-CM | POA: Diagnosis not present

## 2013-03-06 ENCOUNTER — Ambulatory Visit (INDEPENDENT_AMBULATORY_CARE_PROVIDER_SITE_OTHER): Payer: Medicare Other | Admitting: Psychology

## 2013-03-06 DIAGNOSIS — F331 Major depressive disorder, recurrent, moderate: Secondary | ICD-10-CM | POA: Diagnosis not present

## 2013-03-10 DIAGNOSIS — I10 Essential (primary) hypertension: Secondary | ICD-10-CM | POA: Diagnosis not present

## 2013-03-10 DIAGNOSIS — E079 Disorder of thyroid, unspecified: Secondary | ICD-10-CM | POA: Diagnosis not present

## 2013-03-10 DIAGNOSIS — Z79899 Other long term (current) drug therapy: Secondary | ICD-10-CM | POA: Diagnosis not present

## 2013-03-10 DIAGNOSIS — R9431 Abnormal electrocardiogram [ECG] [EKG]: Secondary | ICD-10-CM | POA: Diagnosis not present

## 2013-03-10 DIAGNOSIS — F329 Major depressive disorder, single episode, unspecified: Secondary | ICD-10-CM | POA: Diagnosis not present

## 2013-03-10 DIAGNOSIS — K219 Gastro-esophageal reflux disease without esophagitis: Secondary | ICD-10-CM | POA: Diagnosis not present

## 2013-03-31 ENCOUNTER — Ambulatory Visit (INDEPENDENT_AMBULATORY_CARE_PROVIDER_SITE_OTHER): Payer: Medicare Other | Admitting: Psychology

## 2013-03-31 DIAGNOSIS — F331 Major depressive disorder, recurrent, moderate: Secondary | ICD-10-CM | POA: Diagnosis not present

## 2013-04-02 DIAGNOSIS — H2510 Age-related nuclear cataract, unspecified eye: Secondary | ICD-10-CM | POA: Diagnosis not present

## 2013-04-02 DIAGNOSIS — H02839 Dermatochalasis of unspecified eye, unspecified eyelid: Secondary | ICD-10-CM | POA: Diagnosis not present

## 2013-04-02 DIAGNOSIS — H524 Presbyopia: Secondary | ICD-10-CM | POA: Diagnosis not present

## 2013-04-07 DIAGNOSIS — F329 Major depressive disorder, single episode, unspecified: Secondary | ICD-10-CM | POA: Diagnosis not present

## 2013-04-08 ENCOUNTER — Ambulatory Visit: Payer: Medicare Other | Admitting: Psychology

## 2013-04-15 ENCOUNTER — Ambulatory Visit (INDEPENDENT_AMBULATORY_CARE_PROVIDER_SITE_OTHER): Payer: Medicare Other | Admitting: Psychology

## 2013-04-15 DIAGNOSIS — F331 Major depressive disorder, recurrent, moderate: Secondary | ICD-10-CM | POA: Diagnosis not present

## 2013-05-05 DIAGNOSIS — F411 Generalized anxiety disorder: Secondary | ICD-10-CM | POA: Diagnosis not present

## 2013-05-05 DIAGNOSIS — F329 Major depressive disorder, single episode, unspecified: Secondary | ICD-10-CM | POA: Diagnosis not present

## 2013-05-08 DIAGNOSIS — F3342 Major depressive disorder, recurrent, in full remission: Secondary | ICD-10-CM | POA: Diagnosis not present

## 2013-05-14 DIAGNOSIS — R0609 Other forms of dyspnea: Secondary | ICD-10-CM | POA: Diagnosis not present

## 2013-05-14 DIAGNOSIS — E039 Hypothyroidism, unspecified: Secondary | ICD-10-CM | POA: Diagnosis not present

## 2013-05-14 DIAGNOSIS — F339 Major depressive disorder, recurrent, unspecified: Secondary | ICD-10-CM | POA: Diagnosis not present

## 2013-05-14 DIAGNOSIS — E785 Hyperlipidemia, unspecified: Secondary | ICD-10-CM | POA: Diagnosis not present

## 2013-05-14 DIAGNOSIS — I10 Essential (primary) hypertension: Secondary | ICD-10-CM | POA: Diagnosis not present

## 2013-06-02 DIAGNOSIS — F329 Major depressive disorder, single episode, unspecified: Secondary | ICD-10-CM | POA: Diagnosis not present

## 2013-06-03 DIAGNOSIS — I059 Rheumatic mitral valve disease, unspecified: Secondary | ICD-10-CM | POA: Diagnosis not present

## 2013-06-03 DIAGNOSIS — I10 Essential (primary) hypertension: Secondary | ICD-10-CM | POA: Diagnosis not present

## 2013-06-03 DIAGNOSIS — R0609 Other forms of dyspnea: Secondary | ICD-10-CM | POA: Diagnosis not present

## 2013-06-16 DIAGNOSIS — M1710 Unilateral primary osteoarthritis, unspecified knee: Secondary | ICD-10-CM | POA: Diagnosis not present

## 2013-06-16 DIAGNOSIS — Z7189 Other specified counseling: Secondary | ICD-10-CM | POA: Diagnosis not present

## 2013-06-16 DIAGNOSIS — M810 Age-related osteoporosis without current pathological fracture: Secondary | ICD-10-CM | POA: Diagnosis not present

## 2013-06-16 DIAGNOSIS — E559 Vitamin D deficiency, unspecified: Secondary | ICD-10-CM | POA: Diagnosis not present

## 2013-06-16 DIAGNOSIS — M47817 Spondylosis without myelopathy or radiculopathy, lumbosacral region: Secondary | ICD-10-CM | POA: Diagnosis not present

## 2013-06-16 DIAGNOSIS — M25569 Pain in unspecified knee: Secondary | ICD-10-CM | POA: Diagnosis not present

## 2013-06-16 DIAGNOSIS — M545 Low back pain: Secondary | ICD-10-CM | POA: Diagnosis not present

## 2013-06-23 DIAGNOSIS — F329 Major depressive disorder, single episode, unspecified: Secondary | ICD-10-CM | POA: Diagnosis not present

## 2013-06-26 DIAGNOSIS — Z23 Encounter for immunization: Secondary | ICD-10-CM | POA: Diagnosis not present

## 2013-07-07 DIAGNOSIS — F329 Major depressive disorder, single episode, unspecified: Secondary | ICD-10-CM | POA: Diagnosis not present

## 2013-07-14 DIAGNOSIS — F329 Major depressive disorder, single episode, unspecified: Secondary | ICD-10-CM | POA: Diagnosis not present

## 2013-08-04 DIAGNOSIS — F329 Major depressive disorder, single episode, unspecified: Secondary | ICD-10-CM | POA: Diagnosis not present

## 2013-08-07 ENCOUNTER — Ambulatory Visit: Payer: Medicare Other | Admitting: Psychology

## 2013-08-08 DIAGNOSIS — F329 Major depressive disorder, single episode, unspecified: Secondary | ICD-10-CM | POA: Diagnosis not present

## 2013-08-11 DIAGNOSIS — F329 Major depressive disorder, single episode, unspecified: Secondary | ICD-10-CM | POA: Diagnosis not present

## 2013-08-15 ENCOUNTER — Ambulatory Visit: Payer: Medicare Other | Admitting: Psychology

## 2013-08-18 DIAGNOSIS — F329 Major depressive disorder, single episode, unspecified: Secondary | ICD-10-CM | POA: Diagnosis not present

## 2013-09-01 DIAGNOSIS — F3289 Other specified depressive episodes: Secondary | ICD-10-CM | POA: Diagnosis not present

## 2013-09-01 DIAGNOSIS — F329 Major depressive disorder, single episode, unspecified: Secondary | ICD-10-CM | POA: Diagnosis not present

## 2013-09-08 DIAGNOSIS — S20219A Contusion of unspecified front wall of thorax, initial encounter: Secondary | ICD-10-CM | POA: Diagnosis not present

## 2013-09-15 DIAGNOSIS — R9431 Abnormal electrocardiogram [ECG] [EKG]: Secondary | ICD-10-CM | POA: Diagnosis not present

## 2013-09-15 DIAGNOSIS — E215 Disorder of parathyroid gland, unspecified: Secondary | ICD-10-CM | POA: Diagnosis not present

## 2013-09-15 DIAGNOSIS — F329 Major depressive disorder, single episode, unspecified: Secondary | ICD-10-CM | POA: Diagnosis not present

## 2013-09-15 DIAGNOSIS — Z79899 Other long term (current) drug therapy: Secondary | ICD-10-CM | POA: Diagnosis not present

## 2013-09-15 DIAGNOSIS — Z5181 Encounter for therapeutic drug level monitoring: Secondary | ICD-10-CM | POA: Diagnosis not present

## 2013-09-23 ENCOUNTER — Ambulatory Visit (INDEPENDENT_AMBULATORY_CARE_PROVIDER_SITE_OTHER): Payer: Medicare Other | Admitting: Psychology

## 2013-09-23 DIAGNOSIS — F331 Major depressive disorder, recurrent, moderate: Secondary | ICD-10-CM | POA: Diagnosis not present

## 2013-10-06 DIAGNOSIS — F3289 Other specified depressive episodes: Secondary | ICD-10-CM | POA: Diagnosis not present

## 2013-10-06 DIAGNOSIS — F329 Major depressive disorder, single episode, unspecified: Secondary | ICD-10-CM | POA: Diagnosis not present

## 2013-10-09 DIAGNOSIS — F3342 Major depressive disorder, recurrent, in full remission: Secondary | ICD-10-CM | POA: Diagnosis not present

## 2013-10-10 ENCOUNTER — Ambulatory Visit (INDEPENDENT_AMBULATORY_CARE_PROVIDER_SITE_OTHER): Payer: Medicare Other | Admitting: Psychology

## 2013-10-10 DIAGNOSIS — F331 Major depressive disorder, recurrent, moderate: Secondary | ICD-10-CM | POA: Diagnosis not present

## 2013-10-11 ENCOUNTER — Other Ambulatory Visit (HOSPITAL_COMMUNITY): Payer: Self-pay | Admitting: Psychiatry

## 2013-10-14 NOTE — Telephone Encounter (Signed)
Refill not appropriate, not seen at this office. No future or past appointments.

## 2013-10-27 DIAGNOSIS — I10 Essential (primary) hypertension: Secondary | ICD-10-CM | POA: Diagnosis not present

## 2013-10-27 DIAGNOSIS — K219 Gastro-esophageal reflux disease without esophagitis: Secondary | ICD-10-CM | POA: Diagnosis not present

## 2013-10-27 DIAGNOSIS — I38 Endocarditis, valve unspecified: Secondary | ICD-10-CM | POA: Diagnosis not present

## 2013-10-27 DIAGNOSIS — E079 Disorder of thyroid, unspecified: Secondary | ICD-10-CM | POA: Diagnosis not present

## 2013-10-27 DIAGNOSIS — F329 Major depressive disorder, single episode, unspecified: Secondary | ICD-10-CM | POA: Diagnosis not present

## 2013-10-27 DIAGNOSIS — F3289 Other specified depressive episodes: Secondary | ICD-10-CM | POA: Diagnosis not present

## 2013-11-24 DIAGNOSIS — F329 Major depressive disorder, single episode, unspecified: Secondary | ICD-10-CM | POA: Diagnosis not present

## 2013-11-24 DIAGNOSIS — F3289 Other specified depressive episodes: Secondary | ICD-10-CM | POA: Diagnosis not present

## 2013-12-22 DIAGNOSIS — F329 Major depressive disorder, single episode, unspecified: Secondary | ICD-10-CM | POA: Diagnosis not present

## 2014-01-19 DIAGNOSIS — F3289 Other specified depressive episodes: Secondary | ICD-10-CM | POA: Diagnosis not present

## 2014-01-19 DIAGNOSIS — F329 Major depressive disorder, single episode, unspecified: Secondary | ICD-10-CM | POA: Diagnosis not present

## 2014-01-28 DIAGNOSIS — M949 Disorder of cartilage, unspecified: Secondary | ICD-10-CM | POA: Diagnosis not present

## 2014-01-28 DIAGNOSIS — M899 Disorder of bone, unspecified: Secondary | ICD-10-CM | POA: Diagnosis not present

## 2014-01-28 DIAGNOSIS — R7301 Impaired fasting glucose: Secondary | ICD-10-CM | POA: Diagnosis not present

## 2014-01-28 DIAGNOSIS — Z23 Encounter for immunization: Secondary | ICD-10-CM | POA: Diagnosis not present

## 2014-01-28 DIAGNOSIS — I10 Essential (primary) hypertension: Secondary | ICD-10-CM | POA: Diagnosis not present

## 2014-01-28 DIAGNOSIS — N289 Disorder of kidney and ureter, unspecified: Secondary | ICD-10-CM | POA: Diagnosis not present

## 2014-01-28 DIAGNOSIS — E785 Hyperlipidemia, unspecified: Secondary | ICD-10-CM | POA: Diagnosis not present

## 2014-01-28 DIAGNOSIS — F339 Major depressive disorder, recurrent, unspecified: Secondary | ICD-10-CM | POA: Diagnosis not present

## 2014-01-28 DIAGNOSIS — E039 Hypothyroidism, unspecified: Secondary | ICD-10-CM | POA: Diagnosis not present

## 2014-02-11 DIAGNOSIS — F329 Major depressive disorder, single episode, unspecified: Secondary | ICD-10-CM | POA: Diagnosis not present

## 2014-02-11 DIAGNOSIS — F3289 Other specified depressive episodes: Secondary | ICD-10-CM | POA: Diagnosis not present

## 2014-03-02 DIAGNOSIS — F3289 Other specified depressive episodes: Secondary | ICD-10-CM | POA: Diagnosis not present

## 2014-03-02 DIAGNOSIS — F329 Major depressive disorder, single episode, unspecified: Secondary | ICD-10-CM | POA: Diagnosis not present

## 2014-03-05 DIAGNOSIS — F3342 Major depressive disorder, recurrent, in full remission: Secondary | ICD-10-CM | POA: Diagnosis not present

## 2014-03-06 DIAGNOSIS — M810 Age-related osteoporosis without current pathological fracture: Secondary | ICD-10-CM | POA: Diagnosis not present

## 2014-03-06 DIAGNOSIS — M47817 Spondylosis without myelopathy or radiculopathy, lumbosacral region: Secondary | ICD-10-CM | POA: Diagnosis not present

## 2014-03-06 DIAGNOSIS — M5137 Other intervertebral disc degeneration, lumbosacral region: Secondary | ICD-10-CM | POA: Diagnosis not present

## 2014-03-06 DIAGNOSIS — M4310 Spondylolisthesis, site unspecified: Secondary | ICD-10-CM | POA: Diagnosis not present

## 2014-03-23 DIAGNOSIS — F329 Major depressive disorder, single episode, unspecified: Secondary | ICD-10-CM | POA: Diagnosis not present

## 2014-03-23 DIAGNOSIS — F332 Major depressive disorder, recurrent severe without psychotic features: Secondary | ICD-10-CM | POA: Diagnosis not present

## 2014-03-23 DIAGNOSIS — F3289 Other specified depressive episodes: Secondary | ICD-10-CM | POA: Diagnosis not present

## 2014-04-13 DIAGNOSIS — F3289 Other specified depressive episodes: Secondary | ICD-10-CM | POA: Diagnosis not present

## 2014-04-13 DIAGNOSIS — I38 Endocarditis, valve unspecified: Secondary | ICD-10-CM | POA: Diagnosis not present

## 2014-04-13 DIAGNOSIS — E785 Hyperlipidemia, unspecified: Secondary | ICD-10-CM | POA: Diagnosis not present

## 2014-04-13 DIAGNOSIS — K219 Gastro-esophageal reflux disease without esophagitis: Secondary | ICD-10-CM | POA: Diagnosis not present

## 2014-04-13 DIAGNOSIS — F411 Generalized anxiety disorder: Secondary | ICD-10-CM | POA: Diagnosis not present

## 2014-04-13 DIAGNOSIS — I10 Essential (primary) hypertension: Secondary | ICD-10-CM | POA: Diagnosis not present

## 2014-04-13 DIAGNOSIS — M810 Age-related osteoporosis without current pathological fracture: Secondary | ICD-10-CM | POA: Diagnosis not present

## 2014-04-13 DIAGNOSIS — F329 Major depressive disorder, single episode, unspecified: Secondary | ICD-10-CM | POA: Diagnosis not present

## 2014-04-13 DIAGNOSIS — E215 Disorder of parathyroid gland, unspecified: Secondary | ICD-10-CM | POA: Diagnosis not present

## 2014-04-13 DIAGNOSIS — R569 Unspecified convulsions: Secondary | ICD-10-CM | POA: Diagnosis not present

## 2014-04-13 DIAGNOSIS — E079 Disorder of thyroid, unspecified: Secondary | ICD-10-CM | POA: Diagnosis not present

## 2014-05-04 DIAGNOSIS — F332 Major depressive disorder, recurrent severe without psychotic features: Secondary | ICD-10-CM | POA: Diagnosis not present

## 2014-05-04 DIAGNOSIS — I38 Endocarditis, valve unspecified: Secondary | ICD-10-CM | POA: Diagnosis not present

## 2014-05-04 DIAGNOSIS — M810 Age-related osteoporosis without current pathological fracture: Secondary | ICD-10-CM | POA: Diagnosis not present

## 2014-05-04 DIAGNOSIS — E215 Disorder of parathyroid gland, unspecified: Secondary | ICD-10-CM | POA: Diagnosis not present

## 2014-05-04 DIAGNOSIS — F411 Generalized anxiety disorder: Secondary | ICD-10-CM | POA: Diagnosis not present

## 2014-05-04 DIAGNOSIS — I10 Essential (primary) hypertension: Secondary | ICD-10-CM | POA: Diagnosis not present

## 2014-05-04 DIAGNOSIS — R569 Unspecified convulsions: Secondary | ICD-10-CM | POA: Diagnosis not present

## 2014-05-08 DIAGNOSIS — M899 Disorder of bone, unspecified: Secondary | ICD-10-CM | POA: Diagnosis not present

## 2014-05-08 DIAGNOSIS — M949 Disorder of cartilage, unspecified: Secondary | ICD-10-CM | POA: Diagnosis not present

## 2014-05-08 DIAGNOSIS — M545 Low back pain, unspecified: Secondary | ICD-10-CM | POA: Diagnosis not present

## 2014-05-11 ENCOUNTER — Other Ambulatory Visit: Payer: Self-pay | Admitting: Family Medicine

## 2014-05-11 DIAGNOSIS — M8580 Other specified disorders of bone density and structure, unspecified site: Secondary | ICD-10-CM

## 2014-05-18 DIAGNOSIS — R569 Unspecified convulsions: Secondary | ICD-10-CM | POA: Diagnosis not present

## 2014-05-18 DIAGNOSIS — I10 Essential (primary) hypertension: Secondary | ICD-10-CM | POA: Diagnosis not present

## 2014-05-18 DIAGNOSIS — F329 Major depressive disorder, single episode, unspecified: Secondary | ICD-10-CM | POA: Diagnosis not present

## 2014-05-18 DIAGNOSIS — E215 Disorder of parathyroid gland, unspecified: Secondary | ICD-10-CM | POA: Diagnosis not present

## 2014-05-18 DIAGNOSIS — F3289 Other specified depressive episodes: Secondary | ICD-10-CM | POA: Diagnosis not present

## 2014-05-18 DIAGNOSIS — E079 Disorder of thyroid, unspecified: Secondary | ICD-10-CM | POA: Diagnosis not present

## 2014-05-18 DIAGNOSIS — F411 Generalized anxiety disorder: Secondary | ICD-10-CM | POA: Diagnosis not present

## 2014-05-18 DIAGNOSIS — M810 Age-related osteoporosis without current pathological fracture: Secondary | ICD-10-CM | POA: Diagnosis not present

## 2014-05-18 DIAGNOSIS — I38 Endocarditis, valve unspecified: Secondary | ICD-10-CM | POA: Diagnosis not present

## 2014-05-18 DIAGNOSIS — E785 Hyperlipidemia, unspecified: Secondary | ICD-10-CM | POA: Diagnosis not present

## 2014-05-20 DIAGNOSIS — I10 Essential (primary) hypertension: Secondary | ICD-10-CM | POA: Diagnosis not present

## 2014-05-20 DIAGNOSIS — Z23 Encounter for immunization: Secondary | ICD-10-CM | POA: Diagnosis not present

## 2014-05-26 DIAGNOSIS — M4310 Spondylolisthesis, site unspecified: Secondary | ICD-10-CM | POA: Diagnosis not present

## 2014-05-26 DIAGNOSIS — M5137 Other intervertebral disc degeneration, lumbosacral region: Secondary | ICD-10-CM | POA: Diagnosis not present

## 2014-05-26 DIAGNOSIS — M810 Age-related osteoporosis without current pathological fracture: Secondary | ICD-10-CM | POA: Diagnosis not present

## 2014-05-26 DIAGNOSIS — M47817 Spondylosis without myelopathy or radiculopathy, lumbosacral region: Secondary | ICD-10-CM | POA: Diagnosis not present

## 2014-05-27 ENCOUNTER — Other Ambulatory Visit: Payer: Self-pay

## 2014-05-27 DIAGNOSIS — M899 Disorder of bone, unspecified: Secondary | ICD-10-CM | POA: Diagnosis not present

## 2014-05-27 DIAGNOSIS — Z1231 Encounter for screening mammogram for malignant neoplasm of breast: Secondary | ICD-10-CM | POA: Diagnosis not present

## 2014-05-27 DIAGNOSIS — M949 Disorder of cartilage, unspecified: Secondary | ICD-10-CM | POA: Diagnosis not present

## 2014-05-29 ENCOUNTER — Ambulatory Visit (INDEPENDENT_AMBULATORY_CARE_PROVIDER_SITE_OTHER): Payer: Medicare Other | Admitting: Psychology

## 2014-05-29 ENCOUNTER — Ambulatory Visit: Payer: Medicare Other | Admitting: Psychology

## 2014-05-29 DIAGNOSIS — F331 Major depressive disorder, recurrent, moderate: Secondary | ICD-10-CM

## 2014-06-03 DIAGNOSIS — R928 Other abnormal and inconclusive findings on diagnostic imaging of breast: Secondary | ICD-10-CM | POA: Diagnosis not present

## 2014-06-08 DIAGNOSIS — K219 Gastro-esophageal reflux disease without esophagitis: Secondary | ICD-10-CM | POA: Diagnosis not present

## 2014-06-08 DIAGNOSIS — E079 Disorder of thyroid, unspecified: Secondary | ICD-10-CM | POA: Diagnosis not present

## 2014-06-08 DIAGNOSIS — F329 Major depressive disorder, single episode, unspecified: Secondary | ICD-10-CM | POA: Diagnosis not present

## 2014-06-08 DIAGNOSIS — F411 Generalized anxiety disorder: Secondary | ICD-10-CM | POA: Diagnosis not present

## 2014-06-08 DIAGNOSIS — R569 Unspecified convulsions: Secondary | ICD-10-CM | POA: Diagnosis not present

## 2014-06-19 ENCOUNTER — Ambulatory Visit (INDEPENDENT_AMBULATORY_CARE_PROVIDER_SITE_OTHER): Payer: Medicare Other | Admitting: Psychology

## 2014-06-19 DIAGNOSIS — F331 Major depressive disorder, recurrent, moderate: Secondary | ICD-10-CM

## 2014-06-23 DIAGNOSIS — H2510 Age-related nuclear cataract, unspecified eye: Secondary | ICD-10-CM | POA: Diagnosis not present

## 2014-06-29 DIAGNOSIS — F329 Major depressive disorder, single episode, unspecified: Secondary | ICD-10-CM | POA: Diagnosis not present

## 2014-06-29 DIAGNOSIS — F419 Anxiety disorder, unspecified: Secondary | ICD-10-CM | POA: Diagnosis not present

## 2014-07-03 ENCOUNTER — Ambulatory Visit: Payer: Medicare Other | Admitting: Psychology

## 2014-07-10 ENCOUNTER — Ambulatory Visit (INDEPENDENT_AMBULATORY_CARE_PROVIDER_SITE_OTHER): Payer: Medicare Other | Admitting: Psychology

## 2014-07-10 DIAGNOSIS — F332 Major depressive disorder, recurrent severe without psychotic features: Secondary | ICD-10-CM | POA: Diagnosis not present

## 2014-07-17 ENCOUNTER — Ambulatory Visit (INDEPENDENT_AMBULATORY_CARE_PROVIDER_SITE_OTHER): Payer: Medicare Other | Admitting: Psychology

## 2014-07-17 DIAGNOSIS — F332 Major depressive disorder, recurrent severe without psychotic features: Secondary | ICD-10-CM | POA: Diagnosis not present

## 2014-07-24 ENCOUNTER — Ambulatory Visit: Payer: Self-pay | Admitting: Psychology

## 2014-07-27 DIAGNOSIS — E079 Disorder of thyroid, unspecified: Secondary | ICD-10-CM | POA: Diagnosis not present

## 2014-07-27 DIAGNOSIS — M810 Age-related osteoporosis without current pathological fracture: Secondary | ICD-10-CM | POA: Diagnosis not present

## 2014-07-27 DIAGNOSIS — E215 Disorder of parathyroid gland, unspecified: Secondary | ICD-10-CM | POA: Diagnosis not present

## 2014-07-27 DIAGNOSIS — R569 Unspecified convulsions: Secondary | ICD-10-CM | POA: Diagnosis not present

## 2014-07-27 DIAGNOSIS — I38 Endocarditis, valve unspecified: Secondary | ICD-10-CM | POA: Diagnosis not present

## 2014-07-27 DIAGNOSIS — F419 Anxiety disorder, unspecified: Secondary | ICD-10-CM | POA: Diagnosis not present

## 2014-07-27 DIAGNOSIS — K219 Gastro-esophageal reflux disease without esophagitis: Secondary | ICD-10-CM | POA: Diagnosis not present

## 2014-07-27 DIAGNOSIS — F3341 Major depressive disorder, recurrent, in partial remission: Secondary | ICD-10-CM | POA: Diagnosis not present

## 2014-07-27 DIAGNOSIS — E785 Hyperlipidemia, unspecified: Secondary | ICD-10-CM | POA: Diagnosis not present

## 2014-07-27 DIAGNOSIS — I10 Essential (primary) hypertension: Secondary | ICD-10-CM | POA: Diagnosis not present

## 2014-07-27 DIAGNOSIS — Z79899 Other long term (current) drug therapy: Secondary | ICD-10-CM | POA: Diagnosis not present

## 2014-07-27 DIAGNOSIS — M1990 Unspecified osteoarthritis, unspecified site: Secondary | ICD-10-CM | POA: Diagnosis not present

## 2014-07-31 ENCOUNTER — Ambulatory Visit (INDEPENDENT_AMBULATORY_CARE_PROVIDER_SITE_OTHER): Payer: Medicare Other | Admitting: Psychology

## 2014-07-31 DIAGNOSIS — F332 Major depressive disorder, recurrent severe without psychotic features: Secondary | ICD-10-CM

## 2014-08-04 DIAGNOSIS — F3342 Major depressive disorder, recurrent, in full remission: Secondary | ICD-10-CM | POA: Diagnosis not present

## 2014-08-07 ENCOUNTER — Ambulatory Visit: Payer: Medicare Other | Admitting: Psychology

## 2014-08-24 DIAGNOSIS — Z7982 Long term (current) use of aspirin: Secondary | ICD-10-CM | POA: Diagnosis not present

## 2014-08-24 DIAGNOSIS — F3341 Major depressive disorder, recurrent, in partial remission: Secondary | ICD-10-CM | POA: Diagnosis not present

## 2014-08-24 DIAGNOSIS — Z79899 Other long term (current) drug therapy: Secondary | ICD-10-CM | POA: Diagnosis not present

## 2014-08-24 DIAGNOSIS — E039 Hypothyroidism, unspecified: Secondary | ICD-10-CM | POA: Diagnosis not present

## 2014-08-24 DIAGNOSIS — M1990 Unspecified osteoarthritis, unspecified site: Secondary | ICD-10-CM | POA: Diagnosis not present

## 2014-08-24 DIAGNOSIS — F411 Generalized anxiety disorder: Secondary | ICD-10-CM | POA: Diagnosis not present

## 2014-08-24 DIAGNOSIS — Z8639 Personal history of other endocrine, nutritional and metabolic disease: Secondary | ICD-10-CM | POA: Diagnosis not present

## 2014-08-26 ENCOUNTER — Ambulatory Visit (INDEPENDENT_AMBULATORY_CARE_PROVIDER_SITE_OTHER): Payer: Medicare Other | Admitting: Psychology

## 2014-08-26 DIAGNOSIS — F332 Major depressive disorder, recurrent severe without psychotic features: Secondary | ICD-10-CM

## 2014-09-04 ENCOUNTER — Ambulatory Visit: Payer: Medicare Other | Admitting: Psychology

## 2014-09-11 ENCOUNTER — Ambulatory Visit: Payer: Medicare Other | Admitting: Psychology

## 2014-09-21 DIAGNOSIS — F3341 Major depressive disorder, recurrent, in partial remission: Secondary | ICD-10-CM | POA: Diagnosis not present

## 2014-09-21 DIAGNOSIS — Z452 Encounter for adjustment and management of vascular access device: Secondary | ICD-10-CM | POA: Diagnosis not present

## 2014-09-21 DIAGNOSIS — Y832 Surgical operation with anastomosis, bypass or graft as the cause of abnormal reaction of the patient, or of later complication, without mention of misadventure at the time of the procedure: Secondary | ICD-10-CM | POA: Diagnosis not present

## 2014-09-21 DIAGNOSIS — F419 Anxiety disorder, unspecified: Secondary | ICD-10-CM | POA: Diagnosis not present

## 2014-09-21 DIAGNOSIS — M810 Age-related osteoporosis without current pathological fracture: Secondary | ICD-10-CM | POA: Diagnosis not present

## 2014-09-21 DIAGNOSIS — E039 Hypothyroidism, unspecified: Secondary | ICD-10-CM | POA: Diagnosis not present

## 2014-09-21 DIAGNOSIS — Z888 Allergy status to other drugs, medicaments and biological substances status: Secondary | ICD-10-CM | POA: Diagnosis not present

## 2014-09-21 DIAGNOSIS — Z79899 Other long term (current) drug therapy: Secondary | ICD-10-CM | POA: Diagnosis not present

## 2014-09-21 DIAGNOSIS — Z5181 Encounter for therapeutic drug level monitoring: Secondary | ICD-10-CM | POA: Diagnosis not present

## 2014-09-21 DIAGNOSIS — T82828A Fibrosis of vascular prosthetic devices, implants and grafts, initial encounter: Secondary | ICD-10-CM | POA: Diagnosis not present

## 2014-09-21 DIAGNOSIS — I10 Essential (primary) hypertension: Secondary | ICD-10-CM | POA: Diagnosis not present

## 2014-09-21 DIAGNOSIS — E892 Postprocedural hypoparathyroidism: Secondary | ICD-10-CM | POA: Diagnosis not present

## 2014-09-21 DIAGNOSIS — Z7982 Long term (current) use of aspirin: Secondary | ICD-10-CM | POA: Diagnosis not present

## 2014-09-21 DIAGNOSIS — Z9889 Other specified postprocedural states: Secondary | ICD-10-CM | POA: Diagnosis not present

## 2014-09-21 DIAGNOSIS — E785 Hyperlipidemia, unspecified: Secondary | ICD-10-CM | POA: Diagnosis not present

## 2014-09-21 DIAGNOSIS — T82598A Other mechanical complication of other cardiac and vascular devices and implants, initial encounter: Secondary | ICD-10-CM | POA: Diagnosis not present

## 2014-10-07 DIAGNOSIS — E039 Hypothyroidism, unspecified: Secondary | ICD-10-CM | POA: Diagnosis not present

## 2014-10-07 DIAGNOSIS — Z7982 Long term (current) use of aspirin: Secondary | ICD-10-CM | POA: Diagnosis not present

## 2014-10-07 DIAGNOSIS — Z79899 Other long term (current) drug therapy: Secondary | ICD-10-CM | POA: Diagnosis not present

## 2014-10-07 DIAGNOSIS — F332 Major depressive disorder, recurrent severe without psychotic features: Secondary | ICD-10-CM | POA: Diagnosis not present

## 2014-10-09 ENCOUNTER — Ambulatory Visit (INDEPENDENT_AMBULATORY_CARE_PROVIDER_SITE_OTHER): Payer: Medicare Other | Admitting: Psychology

## 2014-10-09 DIAGNOSIS — F332 Major depressive disorder, recurrent severe without psychotic features: Secondary | ICD-10-CM

## 2014-10-23 ENCOUNTER — Ambulatory Visit: Payer: Medicare Other | Admitting: Psychology

## 2014-10-26 DIAGNOSIS — E039 Hypothyroidism, unspecified: Secondary | ICD-10-CM | POA: Diagnosis not present

## 2014-10-26 DIAGNOSIS — F3341 Major depressive disorder, recurrent, in partial remission: Secondary | ICD-10-CM | POA: Diagnosis not present

## 2014-10-26 DIAGNOSIS — M810 Age-related osteoporosis without current pathological fracture: Secondary | ICD-10-CM | POA: Diagnosis not present

## 2014-10-26 DIAGNOSIS — E785 Hyperlipidemia, unspecified: Secondary | ICD-10-CM | POA: Diagnosis not present

## 2014-10-26 DIAGNOSIS — I10 Essential (primary) hypertension: Secondary | ICD-10-CM | POA: Diagnosis not present

## 2014-10-26 DIAGNOSIS — F419 Anxiety disorder, unspecified: Secondary | ICD-10-CM | POA: Diagnosis not present

## 2014-10-26 DIAGNOSIS — Z79899 Other long term (current) drug therapy: Secondary | ICD-10-CM | POA: Diagnosis not present

## 2014-10-26 DIAGNOSIS — M1990 Unspecified osteoarthritis, unspecified site: Secondary | ICD-10-CM | POA: Diagnosis not present

## 2014-11-03 DIAGNOSIS — R0781 Pleurodynia: Secondary | ICD-10-CM | POA: Diagnosis not present

## 2014-11-03 DIAGNOSIS — R109 Unspecified abdominal pain: Secondary | ICD-10-CM | POA: Diagnosis not present

## 2014-11-06 ENCOUNTER — Ambulatory Visit
Admission: RE | Admit: 2014-11-06 | Discharge: 2014-11-06 | Disposition: A | Discharge status: Home / Self Care | Payer: Medicare Other | Source: Ambulatory Visit | Attending: Family Medicine | Admitting: Family Medicine

## 2014-11-06 ENCOUNTER — Other Ambulatory Visit: Payer: Self-pay | Admitting: Family Medicine

## 2014-11-06 ENCOUNTER — Ambulatory Visit: Payer: Medicare Other | Admitting: Psychology

## 2014-11-06 DIAGNOSIS — M25551 Pain in right hip: Secondary | ICD-10-CM | POA: Diagnosis not present

## 2014-11-06 DIAGNOSIS — R1031 Right lower quadrant pain: Secondary | ICD-10-CM

## 2014-11-12 ENCOUNTER — Ambulatory Visit (INDEPENDENT_AMBULATORY_CARE_PROVIDER_SITE_OTHER): Payer: Medicare Other | Admitting: Psychology

## 2014-11-12 DIAGNOSIS — F332 Major depressive disorder, recurrent severe without psychotic features: Secondary | ICD-10-CM

## 2014-11-16 DIAGNOSIS — F320 Major depressive disorder, single episode, mild: Secondary | ICD-10-CM | POA: Diagnosis not present

## 2014-11-16 DIAGNOSIS — F419 Anxiety disorder, unspecified: Secondary | ICD-10-CM | POA: Diagnosis not present

## 2014-11-16 DIAGNOSIS — E039 Hypothyroidism, unspecified: Secondary | ICD-10-CM | POA: Diagnosis not present

## 2014-11-16 DIAGNOSIS — I10 Essential (primary) hypertension: Secondary | ICD-10-CM | POA: Diagnosis not present

## 2014-11-16 DIAGNOSIS — M1990 Unspecified osteoarthritis, unspecified site: Secondary | ICD-10-CM | POA: Diagnosis not present

## 2014-11-20 ENCOUNTER — Ambulatory Visit: Payer: Medicare Other | Admitting: Psychology

## 2014-11-27 ENCOUNTER — Ambulatory Visit
Admission: RE | Admit: 2014-11-27 | Discharge: 2014-11-27 | Disposition: A | Discharge status: Home / Self Care | Payer: Medicare Other | Source: Ambulatory Visit | Attending: Family Medicine | Admitting: Family Medicine

## 2014-11-27 ENCOUNTER — Other Ambulatory Visit: Payer: Self-pay | Admitting: Family Medicine

## 2014-11-27 DIAGNOSIS — M47816 Spondylosis without myelopathy or radiculopathy, lumbar region: Secondary | ICD-10-CM | POA: Diagnosis not present

## 2014-11-27 DIAGNOSIS — M545 Low back pain: Secondary | ICD-10-CM

## 2014-11-27 DIAGNOSIS — M4316 Spondylolisthesis, lumbar region: Secondary | ICD-10-CM | POA: Diagnosis not present

## 2014-11-27 DIAGNOSIS — M549 Dorsalgia, unspecified: Secondary | ICD-10-CM | POA: Diagnosis not present

## 2014-11-27 DIAGNOSIS — H6123 Impacted cerumen, bilateral: Secondary | ICD-10-CM | POA: Diagnosis not present

## 2014-12-01 ENCOUNTER — Other Ambulatory Visit: Payer: Self-pay | Admitting: Family Medicine

## 2014-12-01 DIAGNOSIS — M5416 Radiculopathy, lumbar region: Secondary | ICD-10-CM

## 2014-12-02 DIAGNOSIS — F3342 Major depressive disorder, recurrent, in full remission: Secondary | ICD-10-CM | POA: Diagnosis not present

## 2014-12-04 ENCOUNTER — Ambulatory Visit (INDEPENDENT_AMBULATORY_CARE_PROVIDER_SITE_OTHER): Payer: Medicare Other | Admitting: Psychology

## 2014-12-04 DIAGNOSIS — F332 Major depressive disorder, recurrent severe without psychotic features: Secondary | ICD-10-CM

## 2014-12-07 DIAGNOSIS — E039 Hypothyroidism, unspecified: Secondary | ICD-10-CM | POA: Diagnosis not present

## 2014-12-07 DIAGNOSIS — Z79899 Other long term (current) drug therapy: Secondary | ICD-10-CM | POA: Diagnosis not present

## 2014-12-07 DIAGNOSIS — Z7982 Long term (current) use of aspirin: Secondary | ICD-10-CM | POA: Diagnosis not present

## 2014-12-07 DIAGNOSIS — F3341 Major depressive disorder, recurrent, in partial remission: Secondary | ICD-10-CM | POA: Diagnosis not present

## 2014-12-07 DIAGNOSIS — E785 Hyperlipidemia, unspecified: Secondary | ICD-10-CM | POA: Diagnosis not present

## 2014-12-07 DIAGNOSIS — Z8639 Personal history of other endocrine, nutritional and metabolic disease: Secondary | ICD-10-CM | POA: Diagnosis not present

## 2014-12-07 DIAGNOSIS — I10 Essential (primary) hypertension: Secondary | ICD-10-CM | POA: Diagnosis not present

## 2014-12-07 DIAGNOSIS — M1990 Unspecified osteoarthritis, unspecified site: Secondary | ICD-10-CM | POA: Diagnosis not present

## 2014-12-07 DIAGNOSIS — F419 Anxiety disorder, unspecified: Secondary | ICD-10-CM | POA: Diagnosis not present

## 2014-12-11 DIAGNOSIS — M1712 Unilateral primary osteoarthritis, left knee: Secondary | ICD-10-CM | POA: Diagnosis not present

## 2014-12-11 DIAGNOSIS — M5136 Other intervertebral disc degeneration, lumbar region: Secondary | ICD-10-CM | POA: Diagnosis not present

## 2014-12-11 DIAGNOSIS — M47816 Spondylosis without myelopathy or radiculopathy, lumbar region: Secondary | ICD-10-CM | POA: Diagnosis not present

## 2014-12-11 DIAGNOSIS — M4316 Spondylolisthesis, lumbar region: Secondary | ICD-10-CM | POA: Diagnosis not present

## 2014-12-21 DIAGNOSIS — E785 Hyperlipidemia, unspecified: Secondary | ICD-10-CM | POA: Diagnosis not present

## 2014-12-21 DIAGNOSIS — Z7982 Long term (current) use of aspirin: Secondary | ICD-10-CM | POA: Diagnosis not present

## 2014-12-21 DIAGNOSIS — Z8639 Personal history of other endocrine, nutritional and metabolic disease: Secondary | ICD-10-CM | POA: Diagnosis not present

## 2014-12-21 DIAGNOSIS — E039 Hypothyroidism, unspecified: Secondary | ICD-10-CM | POA: Diagnosis not present

## 2014-12-21 DIAGNOSIS — M1990 Unspecified osteoarthritis, unspecified site: Secondary | ICD-10-CM | POA: Diagnosis not present

## 2014-12-21 DIAGNOSIS — Z79899 Other long term (current) drug therapy: Secondary | ICD-10-CM | POA: Diagnosis not present

## 2014-12-21 DIAGNOSIS — F419 Anxiety disorder, unspecified: Secondary | ICD-10-CM | POA: Diagnosis not present

## 2014-12-21 DIAGNOSIS — I10 Essential (primary) hypertension: Secondary | ICD-10-CM | POA: Diagnosis not present

## 2014-12-21 DIAGNOSIS — Z880 Allergy status to penicillin: Secondary | ICD-10-CM | POA: Diagnosis not present

## 2014-12-21 DIAGNOSIS — F3341 Major depressive disorder, recurrent, in partial remission: Secondary | ICD-10-CM | POA: Diagnosis not present

## 2014-12-22 ENCOUNTER — Other Ambulatory Visit: Payer: Self-pay

## 2014-12-25 ENCOUNTER — Ambulatory Visit (INDEPENDENT_AMBULATORY_CARE_PROVIDER_SITE_OTHER): Payer: Medicare Other | Admitting: Psychology

## 2014-12-25 DIAGNOSIS — F332 Major depressive disorder, recurrent severe without psychotic features: Secondary | ICD-10-CM | POA: Diagnosis not present

## 2014-12-28 ENCOUNTER — Ambulatory Visit (INDEPENDENT_AMBULATORY_CARE_PROVIDER_SITE_OTHER): Payer: Medicare Other | Admitting: Psychology

## 2014-12-28 DIAGNOSIS — F332 Major depressive disorder, recurrent severe without psychotic features: Secondary | ICD-10-CM

## 2014-12-31 DIAGNOSIS — M47816 Spondylosis without myelopathy or radiculopathy, lumbar region: Secondary | ICD-10-CM | POA: Diagnosis not present

## 2014-12-31 DIAGNOSIS — M4316 Spondylolisthesis, lumbar region: Secondary | ICD-10-CM | POA: Diagnosis not present

## 2014-12-31 DIAGNOSIS — M5136 Other intervertebral disc degeneration, lumbar region: Secondary | ICD-10-CM | POA: Diagnosis not present

## 2014-12-31 DIAGNOSIS — M810 Age-related osteoporosis without current pathological fracture: Secondary | ICD-10-CM | POA: Diagnosis not present

## 2015-01-01 ENCOUNTER — Ambulatory Visit (INDEPENDENT_AMBULATORY_CARE_PROVIDER_SITE_OTHER): Payer: Medicare Other | Admitting: Psychology

## 2015-01-01 DIAGNOSIS — F332 Major depressive disorder, recurrent severe without psychotic features: Secondary | ICD-10-CM | POA: Diagnosis not present

## 2015-01-04 DIAGNOSIS — E039 Hypothyroidism, unspecified: Secondary | ICD-10-CM | POA: Diagnosis not present

## 2015-01-04 DIAGNOSIS — F419 Anxiety disorder, unspecified: Secondary | ICD-10-CM | POA: Diagnosis not present

## 2015-01-04 DIAGNOSIS — E785 Hyperlipidemia, unspecified: Secondary | ICD-10-CM | POA: Diagnosis not present

## 2015-01-04 DIAGNOSIS — M810 Age-related osteoporosis without current pathological fracture: Secondary | ICD-10-CM | POA: Diagnosis not present

## 2015-01-04 DIAGNOSIS — Z79899 Other long term (current) drug therapy: Secondary | ICD-10-CM | POA: Diagnosis not present

## 2015-01-04 DIAGNOSIS — I10 Essential (primary) hypertension: Secondary | ICD-10-CM | POA: Diagnosis not present

## 2015-01-04 DIAGNOSIS — M1990 Unspecified osteoarthritis, unspecified site: Secondary | ICD-10-CM | POA: Diagnosis not present

## 2015-01-04 DIAGNOSIS — F3341 Major depressive disorder, recurrent, in partial remission: Secondary | ICD-10-CM | POA: Diagnosis not present

## 2015-01-15 ENCOUNTER — Ambulatory Visit: Payer: Medicare Other | Admitting: Psychology

## 2015-01-15 ENCOUNTER — Ambulatory Visit (INDEPENDENT_AMBULATORY_CARE_PROVIDER_SITE_OTHER): Payer: Medicare Other | Admitting: Psychology

## 2015-01-15 DIAGNOSIS — F332 Major depressive disorder, recurrent severe without psychotic features: Secondary | ICD-10-CM | POA: Diagnosis not present

## 2015-01-18 DIAGNOSIS — M1990 Unspecified osteoarthritis, unspecified site: Secondary | ICD-10-CM | POA: Diagnosis not present

## 2015-01-18 DIAGNOSIS — F419 Anxiety disorder, unspecified: Secondary | ICD-10-CM | POA: Diagnosis not present

## 2015-01-18 DIAGNOSIS — M810 Age-related osteoporosis without current pathological fracture: Secondary | ICD-10-CM | POA: Diagnosis not present

## 2015-01-18 DIAGNOSIS — I10 Essential (primary) hypertension: Secondary | ICD-10-CM | POA: Diagnosis not present

## 2015-01-18 DIAGNOSIS — F3341 Major depressive disorder, recurrent, in partial remission: Secondary | ICD-10-CM | POA: Diagnosis not present

## 2015-01-18 DIAGNOSIS — E213 Hyperparathyroidism, unspecified: Secondary | ICD-10-CM | POA: Diagnosis not present

## 2015-01-18 DIAGNOSIS — E039 Hypothyroidism, unspecified: Secondary | ICD-10-CM | POA: Diagnosis not present

## 2015-01-18 DIAGNOSIS — E785 Hyperlipidemia, unspecified: Secondary | ICD-10-CM | POA: Diagnosis not present

## 2015-01-25 DIAGNOSIS — M4316 Spondylolisthesis, lumbar region: Secondary | ICD-10-CM | POA: Diagnosis not present

## 2015-01-25 DIAGNOSIS — M5136 Other intervertebral disc degeneration, lumbar region: Secondary | ICD-10-CM | POA: Diagnosis not present

## 2015-01-25 DIAGNOSIS — M5416 Radiculopathy, lumbar region: Secondary | ICD-10-CM | POA: Diagnosis not present

## 2015-01-25 DIAGNOSIS — M47816 Spondylosis without myelopathy or radiculopathy, lumbar region: Secondary | ICD-10-CM | POA: Diagnosis not present

## 2015-01-29 ENCOUNTER — Ambulatory Visit: Payer: Medicare Other | Admitting: Psychology

## 2015-02-05 DIAGNOSIS — M5416 Radiculopathy, lumbar region: Secondary | ICD-10-CM | POA: Diagnosis not present

## 2015-02-05 DIAGNOSIS — M4316 Spondylolisthesis, lumbar region: Secondary | ICD-10-CM | POA: Diagnosis not present

## 2015-02-05 DIAGNOSIS — M5136 Other intervertebral disc degeneration, lumbar region: Secondary | ICD-10-CM | POA: Diagnosis not present

## 2015-02-05 DIAGNOSIS — M25551 Pain in right hip: Secondary | ICD-10-CM | POA: Diagnosis not present

## 2015-02-05 DIAGNOSIS — M4726 Other spondylosis with radiculopathy, lumbar region: Secondary | ICD-10-CM | POA: Diagnosis not present

## 2015-02-08 DIAGNOSIS — M810 Age-related osteoporosis without current pathological fracture: Secondary | ICD-10-CM | POA: Diagnosis not present

## 2015-02-08 DIAGNOSIS — F3341 Major depressive disorder, recurrent, in partial remission: Secondary | ICD-10-CM | POA: Diagnosis not present

## 2015-02-08 DIAGNOSIS — E039 Hypothyroidism, unspecified: Secondary | ICD-10-CM | POA: Diagnosis not present

## 2015-02-08 DIAGNOSIS — E785 Hyperlipidemia, unspecified: Secondary | ICD-10-CM | POA: Diagnosis not present

## 2015-02-08 DIAGNOSIS — I10 Essential (primary) hypertension: Secondary | ICD-10-CM | POA: Diagnosis not present

## 2015-02-12 ENCOUNTER — Ambulatory Visit (INDEPENDENT_AMBULATORY_CARE_PROVIDER_SITE_OTHER): Payer: Medicare Other | Admitting: Psychology

## 2015-02-12 DIAGNOSIS — F332 Major depressive disorder, recurrent severe without psychotic features: Secondary | ICD-10-CM

## 2015-02-15 DIAGNOSIS — M47896 Other spondylosis, lumbar region: Secondary | ICD-10-CM | POA: Diagnosis not present

## 2015-02-15 DIAGNOSIS — M5136 Other intervertebral disc degeneration, lumbar region: Secondary | ICD-10-CM | POA: Diagnosis not present

## 2015-02-15 DIAGNOSIS — M4316 Spondylolisthesis, lumbar region: Secondary | ICD-10-CM | POA: Diagnosis not present

## 2015-02-15 DIAGNOSIS — M5416 Radiculopathy, lumbar region: Secondary | ICD-10-CM | POA: Diagnosis not present

## 2015-02-26 ENCOUNTER — Ambulatory Visit: Payer: Medicare Other | Admitting: Psychology

## 2015-03-08 DIAGNOSIS — E785 Hyperlipidemia, unspecified: Secondary | ICD-10-CM | POA: Diagnosis not present

## 2015-03-08 DIAGNOSIS — F419 Anxiety disorder, unspecified: Secondary | ICD-10-CM | POA: Diagnosis not present

## 2015-03-08 DIAGNOSIS — M810 Age-related osteoporosis without current pathological fracture: Secondary | ICD-10-CM | POA: Diagnosis not present

## 2015-03-08 DIAGNOSIS — I10 Essential (primary) hypertension: Secondary | ICD-10-CM | POA: Diagnosis not present

## 2015-03-08 DIAGNOSIS — E039 Hypothyroidism, unspecified: Secondary | ICD-10-CM | POA: Diagnosis not present

## 2015-03-08 DIAGNOSIS — F3341 Major depressive disorder, recurrent, in partial remission: Secondary | ICD-10-CM | POA: Diagnosis not present

## 2015-03-08 DIAGNOSIS — M159 Polyosteoarthritis, unspecified: Secondary | ICD-10-CM | POA: Diagnosis not present

## 2015-03-10 DIAGNOSIS — M545 Low back pain: Secondary | ICD-10-CM | POA: Diagnosis not present

## 2015-03-10 DIAGNOSIS — M9904 Segmental and somatic dysfunction of sacral region: Secondary | ICD-10-CM | POA: Diagnosis not present

## 2015-03-11 DIAGNOSIS — M545 Low back pain: Secondary | ICD-10-CM | POA: Diagnosis not present

## 2015-03-11 DIAGNOSIS — M9904 Segmental and somatic dysfunction of sacral region: Secondary | ICD-10-CM | POA: Diagnosis not present

## 2015-03-12 ENCOUNTER — Ambulatory Visit (INDEPENDENT_AMBULATORY_CARE_PROVIDER_SITE_OTHER): Payer: Medicare Other | Admitting: Psychology

## 2015-03-12 DIAGNOSIS — F332 Major depressive disorder, recurrent severe without psychotic features: Secondary | ICD-10-CM

## 2015-03-15 DIAGNOSIS — M9904 Segmental and somatic dysfunction of sacral region: Secondary | ICD-10-CM | POA: Diagnosis not present

## 2015-03-15 DIAGNOSIS — M545 Low back pain: Secondary | ICD-10-CM | POA: Diagnosis not present

## 2015-03-24 DIAGNOSIS — M545 Low back pain: Secondary | ICD-10-CM | POA: Diagnosis not present

## 2015-03-24 DIAGNOSIS — M9904 Segmental and somatic dysfunction of sacral region: Secondary | ICD-10-CM | POA: Diagnosis not present

## 2015-03-25 DIAGNOSIS — F3342 Major depressive disorder, recurrent, in full remission: Secondary | ICD-10-CM | POA: Diagnosis not present

## 2015-03-26 ENCOUNTER — Ambulatory Visit (INDEPENDENT_AMBULATORY_CARE_PROVIDER_SITE_OTHER): Payer: Medicare Other | Admitting: Psychology

## 2015-03-26 DIAGNOSIS — F332 Major depressive disorder, recurrent severe without psychotic features: Secondary | ICD-10-CM

## 2015-03-30 DIAGNOSIS — M5136 Other intervertebral disc degeneration, lumbar region: Secondary | ICD-10-CM | POA: Diagnosis not present

## 2015-03-30 DIAGNOSIS — M5416 Radiculopathy, lumbar region: Secondary | ICD-10-CM | POA: Diagnosis not present

## 2015-03-30 DIAGNOSIS — M4316 Spondylolisthesis, lumbar region: Secondary | ICD-10-CM | POA: Diagnosis not present

## 2015-03-30 DIAGNOSIS — M4726 Other spondylosis with radiculopathy, lumbar region: Secondary | ICD-10-CM | POA: Diagnosis not present

## 2015-04-05 DIAGNOSIS — M4726 Other spondylosis with radiculopathy, lumbar region: Secondary | ICD-10-CM | POA: Diagnosis not present

## 2015-04-05 DIAGNOSIS — M4316 Spondylolisthesis, lumbar region: Secondary | ICD-10-CM | POA: Diagnosis not present

## 2015-04-05 DIAGNOSIS — M5136 Other intervertebral disc degeneration, lumbar region: Secondary | ICD-10-CM | POA: Diagnosis not present

## 2015-04-05 DIAGNOSIS — M5126 Other intervertebral disc displacement, lumbar region: Secondary | ICD-10-CM | POA: Diagnosis not present

## 2015-04-05 DIAGNOSIS — M47896 Other spondylosis, lumbar region: Secondary | ICD-10-CM | POA: Diagnosis not present

## 2015-04-05 DIAGNOSIS — M5416 Radiculopathy, lumbar region: Secondary | ICD-10-CM | POA: Diagnosis not present

## 2015-04-05 DIAGNOSIS — M4310 Spondylolisthesis, site unspecified: Secondary | ICD-10-CM | POA: Diagnosis not present

## 2015-04-09 ENCOUNTER — Ambulatory Visit: Payer: Medicare Other | Admitting: Psychology

## 2015-04-12 DIAGNOSIS — F3341 Major depressive disorder, recurrent, in partial remission: Secondary | ICD-10-CM | POA: Diagnosis not present

## 2015-04-12 DIAGNOSIS — Z79899 Other long term (current) drug therapy: Secondary | ICD-10-CM | POA: Diagnosis not present

## 2015-04-14 ENCOUNTER — Ambulatory Visit (INDEPENDENT_AMBULATORY_CARE_PROVIDER_SITE_OTHER): Payer: Medicare Other | Admitting: Psychology

## 2015-04-14 DIAGNOSIS — F332 Major depressive disorder, recurrent severe without psychotic features: Secondary | ICD-10-CM | POA: Diagnosis not present

## 2015-04-15 DIAGNOSIS — M5136 Other intervertebral disc degeneration, lumbar region: Secondary | ICD-10-CM | POA: Diagnosis not present

## 2015-04-15 DIAGNOSIS — M4316 Spondylolisthesis, lumbar region: Secondary | ICD-10-CM | POA: Diagnosis not present

## 2015-04-15 DIAGNOSIS — M4726 Other spondylosis with radiculopathy, lumbar region: Secondary | ICD-10-CM | POA: Diagnosis not present

## 2015-04-15 DIAGNOSIS — M5416 Radiculopathy, lumbar region: Secondary | ICD-10-CM | POA: Diagnosis not present

## 2015-04-23 ENCOUNTER — Ambulatory Visit: Payer: Medicare Other | Admitting: Psychology

## 2015-04-29 ENCOUNTER — Ambulatory Visit (INDEPENDENT_AMBULATORY_CARE_PROVIDER_SITE_OTHER): Payer: Medicare Other | Admitting: Psychology

## 2015-04-29 DIAGNOSIS — F332 Major depressive disorder, recurrent severe without psychotic features: Secondary | ICD-10-CM | POA: Diagnosis not present

## 2015-04-30 ENCOUNTER — Ambulatory Visit: Payer: Medicare Other | Admitting: Psychology

## 2015-05-07 ENCOUNTER — Other Ambulatory Visit (HOSPITAL_COMMUNITY): Payer: Self-pay | Admitting: Psychiatry

## 2015-05-14 ENCOUNTER — Ambulatory Visit: Payer: Medicare Other | Admitting: Psychology

## 2015-05-17 DIAGNOSIS — F419 Anxiety disorder, unspecified: Secondary | ICD-10-CM | POA: Diagnosis not present

## 2015-05-17 DIAGNOSIS — E039 Hypothyroidism, unspecified: Secondary | ICD-10-CM | POA: Diagnosis not present

## 2015-05-17 DIAGNOSIS — E785 Hyperlipidemia, unspecified: Secondary | ICD-10-CM | POA: Diagnosis not present

## 2015-05-17 DIAGNOSIS — F3341 Major depressive disorder, recurrent, in partial remission: Secondary | ICD-10-CM | POA: Diagnosis not present

## 2015-05-17 DIAGNOSIS — Z79899 Other long term (current) drug therapy: Secondary | ICD-10-CM | POA: Diagnosis not present

## 2015-05-17 DIAGNOSIS — M549 Dorsalgia, unspecified: Secondary | ICD-10-CM | POA: Diagnosis not present

## 2015-05-17 DIAGNOSIS — I10 Essential (primary) hypertension: Secondary | ICD-10-CM | POA: Diagnosis not present

## 2015-05-17 DIAGNOSIS — Z780 Asymptomatic menopausal state: Secondary | ICD-10-CM | POA: Diagnosis not present

## 2015-05-17 DIAGNOSIS — Z7982 Long term (current) use of aspirin: Secondary | ICD-10-CM | POA: Diagnosis not present

## 2015-05-17 DIAGNOSIS — M1990 Unspecified osteoarthritis, unspecified site: Secondary | ICD-10-CM | POA: Diagnosis not present

## 2015-05-17 DIAGNOSIS — M810 Age-related osteoporosis without current pathological fracture: Secondary | ICD-10-CM | POA: Diagnosis not present

## 2015-05-17 DIAGNOSIS — M25569 Pain in unspecified knee: Secondary | ICD-10-CM | POA: Diagnosis not present

## 2015-05-20 ENCOUNTER — Ambulatory Visit (INDEPENDENT_AMBULATORY_CARE_PROVIDER_SITE_OTHER): Payer: Medicare Other | Admitting: Psychology

## 2015-05-20 DIAGNOSIS — F332 Major depressive disorder, recurrent severe without psychotic features: Secondary | ICD-10-CM

## 2015-05-28 ENCOUNTER — Ambulatory Visit: Payer: Medicare Other | Admitting: Psychology

## 2015-06-10 DIAGNOSIS — M8580 Other specified disorders of bone density and structure, unspecified site: Secondary | ICD-10-CM | POA: Diagnosis not present

## 2015-06-10 DIAGNOSIS — E039 Hypothyroidism, unspecified: Secondary | ICD-10-CM | POA: Diagnosis not present

## 2015-06-10 DIAGNOSIS — M899 Disorder of bone, unspecified: Secondary | ICD-10-CM | POA: Diagnosis not present

## 2015-06-10 DIAGNOSIS — Z23 Encounter for immunization: Secondary | ICD-10-CM | POA: Diagnosis not present

## 2015-06-10 DIAGNOSIS — I10 Essential (primary) hypertension: Secondary | ICD-10-CM | POA: Diagnosis not present

## 2015-06-10 DIAGNOSIS — F339 Major depressive disorder, recurrent, unspecified: Secondary | ICD-10-CM | POA: Diagnosis not present

## 2015-06-10 DIAGNOSIS — E785 Hyperlipidemia, unspecified: Secondary | ICD-10-CM | POA: Diagnosis not present

## 2015-06-10 DIAGNOSIS — N189 Chronic kidney disease, unspecified: Secondary | ICD-10-CM | POA: Diagnosis not present

## 2015-06-10 DIAGNOSIS — R7301 Impaired fasting glucose: Secondary | ICD-10-CM | POA: Diagnosis not present

## 2015-06-11 ENCOUNTER — Ambulatory Visit (INDEPENDENT_AMBULATORY_CARE_PROVIDER_SITE_OTHER): Payer: Medicare Other | Admitting: Psychology

## 2015-06-11 ENCOUNTER — Ambulatory Visit: Payer: Medicare Other | Admitting: Psychology

## 2015-06-11 DIAGNOSIS — F332 Major depressive disorder, recurrent severe without psychotic features: Secondary | ICD-10-CM

## 2015-06-22 DIAGNOSIS — Z6827 Body mass index (BMI) 27.0-27.9, adult: Secondary | ICD-10-CM | POA: Diagnosis not present

## 2015-06-22 DIAGNOSIS — M1712 Unilateral primary osteoarthritis, left knee: Secondary | ICD-10-CM | POA: Diagnosis not present

## 2015-06-22 DIAGNOSIS — I10 Essential (primary) hypertension: Secondary | ICD-10-CM | POA: Diagnosis not present

## 2015-06-22 DIAGNOSIS — M25562 Pain in left knee: Secondary | ICD-10-CM | POA: Diagnosis not present

## 2015-06-28 DIAGNOSIS — E039 Hypothyroidism, unspecified: Secondary | ICD-10-CM | POA: Diagnosis not present

## 2015-06-28 DIAGNOSIS — Z780 Asymptomatic menopausal state: Secondary | ICD-10-CM | POA: Diagnosis not present

## 2015-06-28 DIAGNOSIS — I10 Essential (primary) hypertension: Secondary | ICD-10-CM | POA: Diagnosis not present

## 2015-06-28 DIAGNOSIS — Z79899 Other long term (current) drug therapy: Secondary | ICD-10-CM | POA: Diagnosis not present

## 2015-06-28 DIAGNOSIS — F3341 Major depressive disorder, recurrent, in partial remission: Secondary | ICD-10-CM | POA: Diagnosis not present

## 2015-06-28 DIAGNOSIS — E785 Hyperlipidemia, unspecified: Secondary | ICD-10-CM | POA: Diagnosis not present

## 2015-06-28 DIAGNOSIS — F419 Anxiety disorder, unspecified: Secondary | ICD-10-CM | POA: Diagnosis not present

## 2015-06-28 DIAGNOSIS — M1990 Unspecified osteoarthritis, unspecified site: Secondary | ICD-10-CM | POA: Diagnosis not present

## 2015-06-28 DIAGNOSIS — Z7982 Long term (current) use of aspirin: Secondary | ICD-10-CM | POA: Diagnosis not present

## 2015-07-01 DIAGNOSIS — F3342 Major depressive disorder, recurrent, in full remission: Secondary | ICD-10-CM | POA: Diagnosis not present

## 2015-07-02 ENCOUNTER — Ambulatory Visit (INDEPENDENT_AMBULATORY_CARE_PROVIDER_SITE_OTHER): Payer: Medicare Other | Admitting: Psychology

## 2015-07-02 DIAGNOSIS — F332 Major depressive disorder, recurrent severe without psychotic features: Secondary | ICD-10-CM | POA: Diagnosis not present

## 2015-07-06 DIAGNOSIS — H2513 Age-related nuclear cataract, bilateral: Secondary | ICD-10-CM | POA: Diagnosis not present

## 2015-07-14 DIAGNOSIS — M1712 Unilateral primary osteoarthritis, left knee: Secondary | ICD-10-CM | POA: Diagnosis not present

## 2015-07-14 DIAGNOSIS — M25562 Pain in left knee: Secondary | ICD-10-CM | POA: Diagnosis not present

## 2015-07-14 DIAGNOSIS — M7122 Synovial cyst of popliteal space [Baker], left knee: Secondary | ICD-10-CM | POA: Diagnosis not present

## 2015-07-14 DIAGNOSIS — R2689 Other abnormalities of gait and mobility: Secondary | ICD-10-CM | POA: Diagnosis not present

## 2015-07-15 DIAGNOSIS — M79605 Pain in left leg: Secondary | ICD-10-CM | POA: Diagnosis not present

## 2015-07-23 ENCOUNTER — Ambulatory Visit (INDEPENDENT_AMBULATORY_CARE_PROVIDER_SITE_OTHER): Payer: Medicare Other | Admitting: Psychology

## 2015-07-23 DIAGNOSIS — F332 Major depressive disorder, recurrent severe without psychotic features: Secondary | ICD-10-CM | POA: Diagnosis not present

## 2015-08-09 DIAGNOSIS — E785 Hyperlipidemia, unspecified: Secondary | ICD-10-CM | POA: Diagnosis not present

## 2015-08-09 DIAGNOSIS — F419 Anxiety disorder, unspecified: Secondary | ICD-10-CM | POA: Diagnosis not present

## 2015-08-09 DIAGNOSIS — Z79899 Other long term (current) drug therapy: Secondary | ICD-10-CM | POA: Diagnosis not present

## 2015-08-09 DIAGNOSIS — Z883 Allergy status to other anti-infective agents status: Secondary | ICD-10-CM | POA: Diagnosis not present

## 2015-08-09 DIAGNOSIS — Z7982 Long term (current) use of aspirin: Secondary | ICD-10-CM | POA: Diagnosis not present

## 2015-08-09 DIAGNOSIS — M810 Age-related osteoporosis without current pathological fracture: Secondary | ICD-10-CM | POA: Diagnosis not present

## 2015-08-09 DIAGNOSIS — I10 Essential (primary) hypertension: Secondary | ICD-10-CM | POA: Diagnosis not present

## 2015-08-09 DIAGNOSIS — F3341 Major depressive disorder, recurrent, in partial remission: Secondary | ICD-10-CM | POA: Diagnosis not present

## 2015-08-09 DIAGNOSIS — Z7983 Long term (current) use of bisphosphonates: Secondary | ICD-10-CM | POA: Diagnosis not present

## 2015-08-09 DIAGNOSIS — E039 Hypothyroidism, unspecified: Secondary | ICD-10-CM | POA: Diagnosis not present

## 2015-08-13 ENCOUNTER — Ambulatory Visit (INDEPENDENT_AMBULATORY_CARE_PROVIDER_SITE_OTHER): Payer: Medicare Other | Admitting: Psychology

## 2015-08-13 DIAGNOSIS — F332 Major depressive disorder, recurrent severe without psychotic features: Secondary | ICD-10-CM | POA: Diagnosis not present

## 2015-08-16 DIAGNOSIS — Z6828 Body mass index (BMI) 28.0-28.9, adult: Secondary | ICD-10-CM | POA: Diagnosis not present

## 2015-08-16 DIAGNOSIS — G20 Parkinson's disease: Secondary | ICD-10-CM | POA: Diagnosis not present

## 2015-08-16 DIAGNOSIS — R2689 Other abnormalities of gait and mobility: Secondary | ICD-10-CM | POA: Diagnosis not present

## 2015-08-16 DIAGNOSIS — R269 Unspecified abnormalities of gait and mobility: Secondary | ICD-10-CM | POA: Diagnosis not present

## 2015-08-16 DIAGNOSIS — R413 Other amnesia: Secondary | ICD-10-CM | POA: Diagnosis not present

## 2015-09-03 ENCOUNTER — Ambulatory Visit (INDEPENDENT_AMBULATORY_CARE_PROVIDER_SITE_OTHER): Payer: Medicare Other | Admitting: Psychology

## 2015-09-03 DIAGNOSIS — F332 Major depressive disorder, recurrent severe without psychotic features: Secondary | ICD-10-CM | POA: Diagnosis not present

## 2015-09-08 DIAGNOSIS — M6281 Muscle weakness (generalized): Secondary | ICD-10-CM | POA: Diagnosis not present

## 2015-09-08 DIAGNOSIS — R269 Unspecified abnormalities of gait and mobility: Secondary | ICD-10-CM | POA: Diagnosis not present

## 2015-09-08 DIAGNOSIS — R2689 Other abnormalities of gait and mobility: Secondary | ICD-10-CM | POA: Diagnosis not present

## 2015-09-13 DIAGNOSIS — M6281 Muscle weakness (generalized): Secondary | ICD-10-CM | POA: Diagnosis not present

## 2015-09-13 DIAGNOSIS — R269 Unspecified abnormalities of gait and mobility: Secondary | ICD-10-CM | POA: Diagnosis not present

## 2015-09-13 DIAGNOSIS — R2689 Other abnormalities of gait and mobility: Secondary | ICD-10-CM | POA: Diagnosis not present

## 2015-09-24 ENCOUNTER — Ambulatory Visit (INDEPENDENT_AMBULATORY_CARE_PROVIDER_SITE_OTHER): Payer: Medicare Other | Admitting: Psychology

## 2015-09-24 DIAGNOSIS — F332 Major depressive disorder, recurrent severe without psychotic features: Secondary | ICD-10-CM

## 2015-09-29 DIAGNOSIS — F3342 Major depressive disorder, recurrent, in full remission: Secondary | ICD-10-CM | POA: Diagnosis not present

## 2015-09-30 DIAGNOSIS — I10 Essential (primary) hypertension: Secondary | ICD-10-CM | POA: Diagnosis not present

## 2015-09-30 DIAGNOSIS — E785 Hyperlipidemia, unspecified: Secondary | ICD-10-CM | POA: Diagnosis not present

## 2015-09-30 DIAGNOSIS — Z23 Encounter for immunization: Secondary | ICD-10-CM | POA: Diagnosis not present

## 2015-09-30 DIAGNOSIS — N189 Chronic kidney disease, unspecified: Secondary | ICD-10-CM | POA: Diagnosis not present

## 2015-09-30 DIAGNOSIS — M899 Disorder of bone, unspecified: Secondary | ICD-10-CM | POA: Diagnosis not present

## 2015-09-30 DIAGNOSIS — F339 Major depressive disorder, recurrent, unspecified: Secondary | ICD-10-CM | POA: Diagnosis not present

## 2015-09-30 DIAGNOSIS — R413 Other amnesia: Secondary | ICD-10-CM | POA: Diagnosis not present

## 2015-09-30 DIAGNOSIS — M8580 Other specified disorders of bone density and structure, unspecified site: Secondary | ICD-10-CM | POA: Diagnosis not present

## 2015-09-30 DIAGNOSIS — R7301 Impaired fasting glucose: Secondary | ICD-10-CM | POA: Diagnosis not present

## 2015-09-30 DIAGNOSIS — E039 Hypothyroidism, unspecified: Secondary | ICD-10-CM | POA: Diagnosis not present

## 2015-10-01 DIAGNOSIS — I10 Essential (primary) hypertension: Secondary | ICD-10-CM | POA: Diagnosis not present

## 2015-10-01 DIAGNOSIS — F3341 Major depressive disorder, recurrent, in partial remission: Secondary | ICD-10-CM | POA: Diagnosis not present

## 2015-10-01 DIAGNOSIS — F332 Major depressive disorder, recurrent severe without psychotic features: Secondary | ICD-10-CM | POA: Diagnosis not present

## 2015-10-01 DIAGNOSIS — E039 Hypothyroidism, unspecified: Secondary | ICD-10-CM | POA: Diagnosis not present

## 2015-10-01 DIAGNOSIS — M1990 Unspecified osteoarthritis, unspecified site: Secondary | ICD-10-CM | POA: Diagnosis not present

## 2015-10-06 DIAGNOSIS — R2689 Other abnormalities of gait and mobility: Secondary | ICD-10-CM | POA: Diagnosis not present

## 2015-10-06 DIAGNOSIS — R269 Unspecified abnormalities of gait and mobility: Secondary | ICD-10-CM | POA: Diagnosis not present

## 2015-10-06 DIAGNOSIS — M6281 Muscle weakness (generalized): Secondary | ICD-10-CM | POA: Diagnosis not present

## 2015-10-11 DIAGNOSIS — R2689 Other abnormalities of gait and mobility: Secondary | ICD-10-CM | POA: Diagnosis not present

## 2015-10-11 DIAGNOSIS — R269 Unspecified abnormalities of gait and mobility: Secondary | ICD-10-CM | POA: Diagnosis not present

## 2015-10-11 DIAGNOSIS — M6281 Muscle weakness (generalized): Secondary | ICD-10-CM | POA: Diagnosis not present

## 2015-10-13 DIAGNOSIS — R2689 Other abnormalities of gait and mobility: Secondary | ICD-10-CM | POA: Diagnosis not present

## 2015-10-13 DIAGNOSIS — M6281 Muscle weakness (generalized): Secondary | ICD-10-CM | POA: Diagnosis not present

## 2015-10-13 DIAGNOSIS — R269 Unspecified abnormalities of gait and mobility: Secondary | ICD-10-CM | POA: Diagnosis not present

## 2015-10-15 ENCOUNTER — Ambulatory Visit (INDEPENDENT_AMBULATORY_CARE_PROVIDER_SITE_OTHER): Payer: Medicare Other | Admitting: Psychology

## 2015-10-15 DIAGNOSIS — F332 Major depressive disorder, recurrent severe without psychotic features: Secondary | ICD-10-CM

## 2015-10-19 DIAGNOSIS — M1712 Unilateral primary osteoarthritis, left knee: Secondary | ICD-10-CM | POA: Diagnosis not present

## 2015-10-19 DIAGNOSIS — I10 Essential (primary) hypertension: Secondary | ICD-10-CM | POA: Diagnosis not present

## 2015-10-19 DIAGNOSIS — M25562 Pain in left knee: Secondary | ICD-10-CM | POA: Diagnosis not present

## 2015-10-19 DIAGNOSIS — G8929 Other chronic pain: Secondary | ICD-10-CM | POA: Diagnosis not present

## 2015-10-19 DIAGNOSIS — Z6828 Body mass index (BMI) 28.0-28.9, adult: Secondary | ICD-10-CM | POA: Diagnosis not present

## 2015-10-20 DIAGNOSIS — R269 Unspecified abnormalities of gait and mobility: Secondary | ICD-10-CM | POA: Diagnosis not present

## 2015-10-20 DIAGNOSIS — R2689 Other abnormalities of gait and mobility: Secondary | ICD-10-CM | POA: Diagnosis not present

## 2015-10-20 DIAGNOSIS — M6281 Muscle weakness (generalized): Secondary | ICD-10-CM | POA: Diagnosis not present

## 2015-10-25 DIAGNOSIS — M6281 Muscle weakness (generalized): Secondary | ICD-10-CM | POA: Diagnosis not present

## 2015-10-25 DIAGNOSIS — R2689 Other abnormalities of gait and mobility: Secondary | ICD-10-CM | POA: Diagnosis not present

## 2015-10-25 DIAGNOSIS — R269 Unspecified abnormalities of gait and mobility: Secondary | ICD-10-CM | POA: Diagnosis not present

## 2015-10-27 DIAGNOSIS — R2689 Other abnormalities of gait and mobility: Secondary | ICD-10-CM | POA: Diagnosis not present

## 2015-10-27 DIAGNOSIS — M6281 Muscle weakness (generalized): Secondary | ICD-10-CM | POA: Diagnosis not present

## 2015-10-27 DIAGNOSIS — R269 Unspecified abnormalities of gait and mobility: Secondary | ICD-10-CM | POA: Diagnosis not present

## 2015-11-05 ENCOUNTER — Ambulatory Visit (INDEPENDENT_AMBULATORY_CARE_PROVIDER_SITE_OTHER): Payer: Medicare Other | Admitting: Psychology

## 2015-11-05 DIAGNOSIS — F332 Major depressive disorder, recurrent severe without psychotic features: Secondary | ICD-10-CM | POA: Diagnosis not present

## 2015-11-08 DIAGNOSIS — R269 Unspecified abnormalities of gait and mobility: Secondary | ICD-10-CM | POA: Diagnosis not present

## 2015-11-08 DIAGNOSIS — M6281 Muscle weakness (generalized): Secondary | ICD-10-CM | POA: Diagnosis not present

## 2015-11-08 DIAGNOSIS — R2689 Other abnormalities of gait and mobility: Secondary | ICD-10-CM | POA: Diagnosis not present

## 2015-11-10 DIAGNOSIS — M6281 Muscle weakness (generalized): Secondary | ICD-10-CM | POA: Diagnosis not present

## 2015-11-10 DIAGNOSIS — R269 Unspecified abnormalities of gait and mobility: Secondary | ICD-10-CM | POA: Diagnosis not present

## 2015-11-10 DIAGNOSIS — R2689 Other abnormalities of gait and mobility: Secondary | ICD-10-CM | POA: Diagnosis not present

## 2015-11-15 DIAGNOSIS — F3341 Major depressive disorder, recurrent, in partial remission: Secondary | ICD-10-CM | POA: Diagnosis not present

## 2015-11-15 DIAGNOSIS — E785 Hyperlipidemia, unspecified: Secondary | ICD-10-CM | POA: Diagnosis not present

## 2015-11-15 DIAGNOSIS — M1712 Unilateral primary osteoarthritis, left knee: Secondary | ICD-10-CM | POA: Diagnosis not present

## 2015-11-15 DIAGNOSIS — F419 Anxiety disorder, unspecified: Secondary | ICD-10-CM | POA: Diagnosis not present

## 2015-11-15 DIAGNOSIS — Z79899 Other long term (current) drug therapy: Secondary | ICD-10-CM | POA: Diagnosis not present

## 2015-11-15 DIAGNOSIS — M810 Age-related osteoporosis without current pathological fracture: Secondary | ICD-10-CM | POA: Diagnosis not present

## 2015-11-15 DIAGNOSIS — I10 Essential (primary) hypertension: Secondary | ICD-10-CM | POA: Diagnosis not present

## 2015-11-17 DIAGNOSIS — R2689 Other abnormalities of gait and mobility: Secondary | ICD-10-CM | POA: Diagnosis not present

## 2015-11-17 DIAGNOSIS — M6281 Muscle weakness (generalized): Secondary | ICD-10-CM | POA: Diagnosis not present

## 2015-11-17 DIAGNOSIS — R269 Unspecified abnormalities of gait and mobility: Secondary | ICD-10-CM | POA: Diagnosis not present

## 2015-11-24 DIAGNOSIS — R269 Unspecified abnormalities of gait and mobility: Secondary | ICD-10-CM | POA: Diagnosis not present

## 2015-11-24 DIAGNOSIS — R2689 Other abnormalities of gait and mobility: Secondary | ICD-10-CM | POA: Diagnosis not present

## 2015-11-24 DIAGNOSIS — M6281 Muscle weakness (generalized): Secondary | ICD-10-CM | POA: Diagnosis not present

## 2015-11-29 DIAGNOSIS — R269 Unspecified abnormalities of gait and mobility: Secondary | ICD-10-CM | POA: Diagnosis not present

## 2015-11-29 DIAGNOSIS — M6281 Muscle weakness (generalized): Secondary | ICD-10-CM | POA: Diagnosis not present

## 2015-11-29 DIAGNOSIS — R2689 Other abnormalities of gait and mobility: Secondary | ICD-10-CM | POA: Diagnosis not present

## 2015-12-08 DIAGNOSIS — F3342 Major depressive disorder, recurrent, in full remission: Secondary | ICD-10-CM | POA: Diagnosis not present

## 2015-12-10 ENCOUNTER — Ambulatory Visit (INDEPENDENT_AMBULATORY_CARE_PROVIDER_SITE_OTHER): Payer: Medicare Other | Admitting: Psychology

## 2015-12-10 DIAGNOSIS — F332 Major depressive disorder, recurrent severe without psychotic features: Secondary | ICD-10-CM

## 2015-12-27 DIAGNOSIS — E213 Hyperparathyroidism, unspecified: Secondary | ICD-10-CM | POA: Diagnosis not present

## 2015-12-27 DIAGNOSIS — F3341 Major depressive disorder, recurrent, in partial remission: Secondary | ICD-10-CM | POA: Diagnosis not present

## 2015-12-27 DIAGNOSIS — E785 Hyperlipidemia, unspecified: Secondary | ICD-10-CM | POA: Diagnosis not present

## 2015-12-27 DIAGNOSIS — Z79899 Other long term (current) drug therapy: Secondary | ICD-10-CM | POA: Diagnosis not present

## 2015-12-27 DIAGNOSIS — I10 Essential (primary) hypertension: Secondary | ICD-10-CM | POA: Diagnosis not present

## 2015-12-27 DIAGNOSIS — F329 Major depressive disorder, single episode, unspecified: Secondary | ICD-10-CM | POA: Diagnosis not present

## 2015-12-27 DIAGNOSIS — F419 Anxiety disorder, unspecified: Secondary | ICD-10-CM | POA: Diagnosis not present

## 2016-01-07 ENCOUNTER — Ambulatory Visit (INDEPENDENT_AMBULATORY_CARE_PROVIDER_SITE_OTHER): Payer: Medicare Other | Admitting: Psychology

## 2016-01-07 DIAGNOSIS — F332 Major depressive disorder, recurrent severe without psychotic features: Secondary | ICD-10-CM

## 2016-02-07 DIAGNOSIS — R569 Unspecified convulsions: Secondary | ICD-10-CM | POA: Diagnosis not present

## 2016-02-07 DIAGNOSIS — F3341 Major depressive disorder, recurrent, in partial remission: Secondary | ICD-10-CM | POA: Diagnosis not present

## 2016-03-03 ENCOUNTER — Ambulatory Visit (INDEPENDENT_AMBULATORY_CARE_PROVIDER_SITE_OTHER): Payer: Medicare Other | Admitting: Psychology

## 2016-03-03 DIAGNOSIS — F332 Major depressive disorder, recurrent severe without psychotic features: Secondary | ICD-10-CM | POA: Diagnosis not present

## 2016-03-20 DIAGNOSIS — F3341 Major depressive disorder, recurrent, in partial remission: Secondary | ICD-10-CM | POA: Diagnosis not present

## 2016-03-31 DIAGNOSIS — F3342 Major depressive disorder, recurrent, in full remission: Secondary | ICD-10-CM | POA: Diagnosis not present

## 2016-04-05 DIAGNOSIS — I10 Essential (primary) hypertension: Secondary | ICD-10-CM | POA: Diagnosis not present

## 2016-04-05 DIAGNOSIS — N189 Chronic kidney disease, unspecified: Secondary | ICD-10-CM | POA: Diagnosis not present

## 2016-05-08 ENCOUNTER — Ambulatory Visit (INDEPENDENT_AMBULATORY_CARE_PROVIDER_SITE_OTHER): Payer: Medicare Other | Admitting: Psychology

## 2016-05-08 DIAGNOSIS — F332 Major depressive disorder, recurrent severe without psychotic features: Secondary | ICD-10-CM | POA: Diagnosis not present

## 2016-05-15 DIAGNOSIS — F3341 Major depressive disorder, recurrent, in partial remission: Secondary | ICD-10-CM | POA: Diagnosis not present

## 2016-06-09 ENCOUNTER — Ambulatory Visit (INDEPENDENT_AMBULATORY_CARE_PROVIDER_SITE_OTHER): Payer: Medicare Other | Admitting: Psychology

## 2016-06-09 DIAGNOSIS — F331 Major depressive disorder, recurrent, moderate: Secondary | ICD-10-CM | POA: Diagnosis not present

## 2016-06-15 ENCOUNTER — Other Ambulatory Visit (HOSPITAL_COMMUNITY): Payer: Self-pay | Admitting: Psychiatry

## 2016-06-19 DIAGNOSIS — M25562 Pain in left knee: Secondary | ICD-10-CM | POA: Diagnosis not present

## 2016-06-19 DIAGNOSIS — I10 Essential (primary) hypertension: Secondary | ICD-10-CM | POA: Diagnosis not present

## 2016-06-19 DIAGNOSIS — M1712 Unilateral primary osteoarthritis, left knee: Secondary | ICD-10-CM | POA: Diagnosis not present

## 2016-06-20 DIAGNOSIS — M5136 Other intervertebral disc degeneration, lumbar region: Secondary | ICD-10-CM | POA: Diagnosis not present

## 2016-06-20 DIAGNOSIS — M5416 Radiculopathy, lumbar region: Secondary | ICD-10-CM | POA: Diagnosis not present

## 2016-06-20 DIAGNOSIS — M4316 Spondylolisthesis, lumbar region: Secondary | ICD-10-CM | POA: Diagnosis not present

## 2016-06-20 DIAGNOSIS — Z6841 Body Mass Index (BMI) 40.0 and over, adult: Secondary | ICD-10-CM | POA: Diagnosis not present

## 2016-06-20 DIAGNOSIS — M549 Dorsalgia, unspecified: Secondary | ICD-10-CM | POA: Diagnosis not present

## 2016-06-20 DIAGNOSIS — M47816 Spondylosis without myelopathy or radiculopathy, lumbar region: Secondary | ICD-10-CM | POA: Diagnosis not present

## 2016-06-30 DIAGNOSIS — M5136 Other intervertebral disc degeneration, lumbar region: Secondary | ICD-10-CM | POA: Diagnosis not present

## 2016-06-30 DIAGNOSIS — M4316 Spondylolisthesis, lumbar region: Secondary | ICD-10-CM | POA: Diagnosis not present

## 2016-06-30 DIAGNOSIS — M545 Low back pain: Secondary | ICD-10-CM | POA: Diagnosis not present

## 2016-06-30 DIAGNOSIS — M5416 Radiculopathy, lumbar region: Secondary | ICD-10-CM | POA: Diagnosis not present

## 2016-07-04 DIAGNOSIS — Z23 Encounter for immunization: Secondary | ICD-10-CM | POA: Diagnosis not present

## 2016-07-05 DIAGNOSIS — F332 Major depressive disorder, recurrent severe without psychotic features: Secondary | ICD-10-CM | POA: Diagnosis not present

## 2016-07-05 DIAGNOSIS — F3341 Major depressive disorder, recurrent, in partial remission: Secondary | ICD-10-CM | POA: Diagnosis not present

## 2016-07-28 ENCOUNTER — Ambulatory Visit: Payer: Medicare Other | Admitting: Psychology

## 2016-07-31 DIAGNOSIS — F3342 Major depressive disorder, recurrent, in full remission: Secondary | ICD-10-CM | POA: Diagnosis not present

## 2016-08-02 DIAGNOSIS — M5136 Other intervertebral disc degeneration, lumbar region: Secondary | ICD-10-CM | POA: Diagnosis not present

## 2016-08-02 DIAGNOSIS — M47816 Spondylosis without myelopathy or radiculopathy, lumbar region: Secondary | ICD-10-CM | POA: Diagnosis not present

## 2016-08-02 DIAGNOSIS — M5416 Radiculopathy, lumbar region: Secondary | ICD-10-CM | POA: Diagnosis not present

## 2016-08-02 DIAGNOSIS — M47896 Other spondylosis, lumbar region: Secondary | ICD-10-CM | POA: Diagnosis not present

## 2016-08-02 DIAGNOSIS — M4316 Spondylolisthesis, lumbar region: Secondary | ICD-10-CM | POA: Diagnosis not present

## 2016-08-11 ENCOUNTER — Ambulatory Visit (INDEPENDENT_AMBULATORY_CARE_PROVIDER_SITE_OTHER): Payer: Medicare Other | Admitting: Psychology

## 2016-08-11 DIAGNOSIS — F331 Major depressive disorder, recurrent, moderate: Secondary | ICD-10-CM

## 2016-08-30 DIAGNOSIS — F3341 Major depressive disorder, recurrent, in partial remission: Secondary | ICD-10-CM | POA: Diagnosis not present

## 2016-10-04 DIAGNOSIS — R0781 Pleurodynia: Secondary | ICD-10-CM | POA: Diagnosis not present

## 2016-10-04 DIAGNOSIS — R296 Repeated falls: Secondary | ICD-10-CM | POA: Diagnosis not present

## 2016-10-06 ENCOUNTER — Ambulatory Visit: Payer: Medicare Other | Admitting: Psychology

## 2016-10-10 DIAGNOSIS — Z452 Encounter for adjustment and management of vascular access device: Secondary | ICD-10-CM | POA: Diagnosis not present

## 2016-10-16 ENCOUNTER — Ambulatory Visit: Payer: Medicare Other | Admitting: Psychology

## 2016-10-27 ENCOUNTER — Ambulatory Visit: Payer: Medicare Other | Admitting: Psychology

## 2016-10-31 ENCOUNTER — Ambulatory Visit (INDEPENDENT_AMBULATORY_CARE_PROVIDER_SITE_OTHER): Payer: Medicare Other | Admitting: Psychology

## 2016-10-31 DIAGNOSIS — F332 Major depressive disorder, recurrent severe without psychotic features: Secondary | ICD-10-CM | POA: Diagnosis not present

## 2016-11-08 DIAGNOSIS — F3341 Major depressive disorder, recurrent, in partial remission: Secondary | ICD-10-CM | POA: Diagnosis not present

## 2016-11-22 ENCOUNTER — Ambulatory Visit (INDEPENDENT_AMBULATORY_CARE_PROVIDER_SITE_OTHER): Payer: Medicare Other | Admitting: Psychology

## 2016-11-22 DIAGNOSIS — F332 Major depressive disorder, recurrent severe without psychotic features: Secondary | ICD-10-CM

## 2016-12-13 DIAGNOSIS — M5416 Radiculopathy, lumbar region: Secondary | ICD-10-CM | POA: Diagnosis not present

## 2016-12-13 DIAGNOSIS — M47816 Spondylosis without myelopathy or radiculopathy, lumbar region: Secondary | ICD-10-CM | POA: Diagnosis not present

## 2016-12-13 DIAGNOSIS — G894 Chronic pain syndrome: Secondary | ICD-10-CM | POA: Diagnosis not present

## 2016-12-13 DIAGNOSIS — M5136 Other intervertebral disc degeneration, lumbar region: Secondary | ICD-10-CM | POA: Diagnosis not present

## 2016-12-25 ENCOUNTER — Ambulatory Visit (INDEPENDENT_AMBULATORY_CARE_PROVIDER_SITE_OTHER): Payer: Medicare Other | Admitting: Psychology

## 2016-12-25 DIAGNOSIS — F332 Major depressive disorder, recurrent severe without psychotic features: Secondary | ICD-10-CM

## 2016-12-28 DIAGNOSIS — F3342 Major depressive disorder, recurrent, in full remission: Secondary | ICD-10-CM | POA: Diagnosis not present

## 2017-01-03 DIAGNOSIS — Z7982 Long term (current) use of aspirin: Secondary | ICD-10-CM | POA: Diagnosis not present

## 2017-01-03 DIAGNOSIS — Z791 Long term (current) use of non-steroidal anti-inflammatories (NSAID): Secondary | ICD-10-CM | POA: Diagnosis not present

## 2017-01-03 DIAGNOSIS — F3341 Major depressive disorder, recurrent, in partial remission: Secondary | ICD-10-CM | POA: Diagnosis not present

## 2017-01-03 DIAGNOSIS — F419 Anxiety disorder, unspecified: Secondary | ICD-10-CM | POA: Diagnosis not present

## 2017-01-03 DIAGNOSIS — Z79899 Other long term (current) drug therapy: Secondary | ICD-10-CM | POA: Diagnosis not present

## 2017-01-09 DIAGNOSIS — R269 Unspecified abnormalities of gait and mobility: Secondary | ICD-10-CM | POA: Diagnosis not present

## 2017-01-09 DIAGNOSIS — Z9181 History of falling: Secondary | ICD-10-CM | POA: Diagnosis not present

## 2017-01-09 DIAGNOSIS — R413 Other amnesia: Secondary | ICD-10-CM | POA: Diagnosis not present

## 2017-01-09 DIAGNOSIS — Z6828 Body mass index (BMI) 28.0-28.9, adult: Secondary | ICD-10-CM | POA: Diagnosis not present

## 2017-01-18 DIAGNOSIS — Z9181 History of falling: Secondary | ICD-10-CM | POA: Diagnosis not present

## 2017-01-18 DIAGNOSIS — R2681 Unsteadiness on feet: Secondary | ICD-10-CM | POA: Diagnosis not present

## 2017-01-22 DIAGNOSIS — S0990XA Unspecified injury of head, initial encounter: Secondary | ICD-10-CM | POA: Diagnosis not present

## 2017-01-22 DIAGNOSIS — R413 Other amnesia: Secondary | ICD-10-CM | POA: Diagnosis not present

## 2017-01-22 DIAGNOSIS — R269 Unspecified abnormalities of gait and mobility: Secondary | ICD-10-CM | POA: Diagnosis not present

## 2017-01-23 DIAGNOSIS — R269 Unspecified abnormalities of gait and mobility: Secondary | ICD-10-CM | POA: Diagnosis not present

## 2017-01-23 DIAGNOSIS — Z6828 Body mass index (BMI) 28.0-28.9, adult: Secondary | ICD-10-CM | POA: Diagnosis not present

## 2017-01-23 DIAGNOSIS — R413 Other amnesia: Secondary | ICD-10-CM | POA: Diagnosis not present

## 2017-01-24 DIAGNOSIS — R2681 Unsteadiness on feet: Secondary | ICD-10-CM | POA: Diagnosis not present

## 2017-01-24 DIAGNOSIS — Z9181 History of falling: Secondary | ICD-10-CM | POA: Diagnosis not present

## 2017-02-07 DIAGNOSIS — Z9181 History of falling: Secondary | ICD-10-CM | POA: Diagnosis not present

## 2017-02-07 DIAGNOSIS — R2681 Unsteadiness on feet: Secondary | ICD-10-CM | POA: Diagnosis not present

## 2017-02-07 DIAGNOSIS — H2513 Age-related nuclear cataract, bilateral: Secondary | ICD-10-CM | POA: Diagnosis not present

## 2017-02-16 DIAGNOSIS — R2681 Unsteadiness on feet: Secondary | ICD-10-CM | POA: Diagnosis not present

## 2017-02-16 DIAGNOSIS — Z9181 History of falling: Secondary | ICD-10-CM | POA: Diagnosis not present

## 2017-02-28 DIAGNOSIS — R05 Cough: Secondary | ICD-10-CM | POA: Diagnosis not present

## 2017-02-28 DIAGNOSIS — R413 Other amnesia: Secondary | ICD-10-CM | POA: Diagnosis not present

## 2017-02-28 DIAGNOSIS — N189 Chronic kidney disease, unspecified: Secondary | ICD-10-CM | POA: Diagnosis not present

## 2017-02-28 DIAGNOSIS — I10 Essential (primary) hypertension: Secondary | ICD-10-CM | POA: Diagnosis not present

## 2017-02-28 DIAGNOSIS — E039 Hypothyroidism, unspecified: Secondary | ICD-10-CM | POA: Diagnosis not present

## 2017-03-02 ENCOUNTER — Ambulatory Visit: Payer: Medicare Other | Admitting: Psychology

## 2017-03-09 DIAGNOSIS — F3341 Major depressive disorder, recurrent, in partial remission: Secondary | ICD-10-CM | POA: Diagnosis not present

## 2017-03-16 ENCOUNTER — Ambulatory Visit (INDEPENDENT_AMBULATORY_CARE_PROVIDER_SITE_OTHER): Payer: Medicare Other | Admitting: Psychology

## 2017-03-16 DIAGNOSIS — F332 Major depressive disorder, recurrent severe without psychotic features: Secondary | ICD-10-CM | POA: Diagnosis not present

## 2017-04-11 DIAGNOSIS — Z452 Encounter for adjustment and management of vascular access device: Secondary | ICD-10-CM | POA: Diagnosis not present

## 2017-05-08 DIAGNOSIS — G894 Chronic pain syndrome: Secondary | ICD-10-CM | POA: Diagnosis not present

## 2017-05-08 DIAGNOSIS — M4316 Spondylolisthesis, lumbar region: Secondary | ICD-10-CM | POA: Diagnosis not present

## 2017-05-08 DIAGNOSIS — M47816 Spondylosis without myelopathy or radiculopathy, lumbar region: Secondary | ICD-10-CM | POA: Diagnosis not present

## 2017-05-08 DIAGNOSIS — M5136 Other intervertebral disc degeneration, lumbar region: Secondary | ICD-10-CM | POA: Diagnosis not present

## 2017-05-08 DIAGNOSIS — Z6828 Body mass index (BMI) 28.0-28.9, adult: Secondary | ICD-10-CM | POA: Diagnosis not present

## 2017-05-08 DIAGNOSIS — R269 Unspecified abnormalities of gait and mobility: Secondary | ICD-10-CM | POA: Diagnosis not present

## 2017-05-09 DIAGNOSIS — F325 Major depressive disorder, single episode, in full remission: Secondary | ICD-10-CM | POA: Diagnosis not present

## 2017-05-09 DIAGNOSIS — I10 Essential (primary) hypertension: Secondary | ICD-10-CM | POA: Diagnosis not present

## 2017-05-09 DIAGNOSIS — M1712 Unilateral primary osteoarthritis, left knee: Secondary | ICD-10-CM | POA: Diagnosis not present

## 2017-05-09 DIAGNOSIS — E213 Hyperparathyroidism, unspecified: Secondary | ICD-10-CM | POA: Diagnosis not present

## 2017-05-09 DIAGNOSIS — E039 Hypothyroidism, unspecified: Secondary | ICD-10-CM | POA: Diagnosis not present

## 2017-05-29 DIAGNOSIS — F3342 Major depressive disorder, recurrent, in full remission: Secondary | ICD-10-CM | POA: Diagnosis not present

## 2017-06-08 DIAGNOSIS — Z452 Encounter for adjustment and management of vascular access device: Secondary | ICD-10-CM | POA: Diagnosis not present

## 2017-06-15 DIAGNOSIS — Z23 Encounter for immunization: Secondary | ICD-10-CM | POA: Diagnosis not present

## 2017-07-11 DIAGNOSIS — I440 Atrioventricular block, first degree: Secondary | ICD-10-CM | POA: Diagnosis not present

## 2017-07-11 DIAGNOSIS — F3341 Major depressive disorder, recurrent, in partial remission: Secondary | ICD-10-CM | POA: Diagnosis not present

## 2017-07-11 DIAGNOSIS — I129 Hypertensive chronic kidney disease with stage 1 through stage 4 chronic kidney disease, or unspecified chronic kidney disease: Secondary | ICD-10-CM | POA: Diagnosis not present

## 2017-07-11 DIAGNOSIS — I10 Essential (primary) hypertension: Secondary | ICD-10-CM | POA: Diagnosis not present

## 2017-07-11 DIAGNOSIS — E038 Other specified hypothyroidism: Secondary | ICD-10-CM | POA: Diagnosis not present

## 2017-07-11 DIAGNOSIS — F419 Anxiety disorder, unspecified: Secondary | ICD-10-CM | POA: Diagnosis not present

## 2017-07-11 DIAGNOSIS — F332 Major depressive disorder, recurrent severe without psychotic features: Secondary | ICD-10-CM | POA: Diagnosis not present

## 2017-07-11 DIAGNOSIS — N189 Chronic kidney disease, unspecified: Secondary | ICD-10-CM | POA: Diagnosis not present

## 2017-07-19 ENCOUNTER — Ambulatory Visit (INDEPENDENT_AMBULATORY_CARE_PROVIDER_SITE_OTHER): Payer: Medicare Other | Admitting: Psychology

## 2017-07-19 DIAGNOSIS — F332 Major depressive disorder, recurrent severe without psychotic features: Secondary | ICD-10-CM

## 2017-07-20 ENCOUNTER — Ambulatory Visit: Payer: Medicare Other | Admitting: Psychology

## 2017-07-31 DIAGNOSIS — M533 Sacrococcygeal disorders, not elsewhere classified: Secondary | ICD-10-CM | POA: Diagnosis not present

## 2017-07-31 DIAGNOSIS — R296 Repeated falls: Secondary | ICD-10-CM | POA: Diagnosis not present

## 2017-08-02 DIAGNOSIS — S300XXA Contusion of lower back and pelvis, initial encounter: Secondary | ICD-10-CM | POA: Diagnosis not present

## 2017-08-02 DIAGNOSIS — M533 Sacrococcygeal disorders, not elsewhere classified: Secondary | ICD-10-CM | POA: Diagnosis not present

## 2017-08-02 DIAGNOSIS — W19XXXA Unspecified fall, initial encounter: Secondary | ICD-10-CM | POA: Diagnosis not present

## 2017-08-28 ENCOUNTER — Ambulatory Visit (INDEPENDENT_AMBULATORY_CARE_PROVIDER_SITE_OTHER): Payer: Medicare Other | Admitting: Psychology

## 2017-08-28 DIAGNOSIS — F332 Major depressive disorder, recurrent severe without psychotic features: Secondary | ICD-10-CM

## 2017-09-12 DIAGNOSIS — I129 Hypertensive chronic kidney disease with stage 1 through stage 4 chronic kidney disease, or unspecified chronic kidney disease: Secondary | ICD-10-CM | POA: Diagnosis not present

## 2017-09-12 DIAGNOSIS — F332 Major depressive disorder, recurrent severe without psychotic features: Secondary | ICD-10-CM | POA: Diagnosis not present

## 2017-09-12 DIAGNOSIS — N189 Chronic kidney disease, unspecified: Secondary | ICD-10-CM | POA: Diagnosis not present

## 2017-09-20 DIAGNOSIS — F3342 Major depressive disorder, recurrent, in full remission: Secondary | ICD-10-CM | POA: Diagnosis not present

## 2017-10-03 ENCOUNTER — Ambulatory Visit: Payer: Medicare Other | Admitting: Psychology

## 2017-10-03 DIAGNOSIS — M5116 Intervertebral disc disorders with radiculopathy, lumbar region: Secondary | ICD-10-CM | POA: Diagnosis not present

## 2017-10-03 DIAGNOSIS — M47816 Spondylosis without myelopathy or radiculopathy, lumbar region: Secondary | ICD-10-CM | POA: Diagnosis not present

## 2017-10-03 DIAGNOSIS — M4316 Spondylolisthesis, lumbar region: Secondary | ICD-10-CM | POA: Diagnosis not present

## 2017-10-10 DIAGNOSIS — Z452 Encounter for adjustment and management of vascular access device: Secondary | ICD-10-CM | POA: Diagnosis not present

## 2017-10-24 ENCOUNTER — Ambulatory Visit: Payer: Medicare Other | Admitting: Psychology

## 2017-10-29 DIAGNOSIS — H6983 Other specified disorders of Eustachian tube, bilateral: Secondary | ICD-10-CM | POA: Diagnosis not present

## 2017-11-09 DIAGNOSIS — I10 Essential (primary) hypertension: Secondary | ICD-10-CM | POA: Diagnosis not present

## 2017-11-09 DIAGNOSIS — F3341 Major depressive disorder, recurrent, in partial remission: Secondary | ICD-10-CM | POA: Diagnosis not present

## 2017-11-09 DIAGNOSIS — F418 Other specified anxiety disorders: Secondary | ICD-10-CM | POA: Diagnosis not present

## 2017-11-13 ENCOUNTER — Ambulatory Visit (INDEPENDENT_AMBULATORY_CARE_PROVIDER_SITE_OTHER): Payer: Medicare Other | Admitting: Psychology

## 2017-11-13 DIAGNOSIS — F332 Major depressive disorder, recurrent severe without psychotic features: Secondary | ICD-10-CM

## 2017-12-07 DIAGNOSIS — Z452 Encounter for adjustment and management of vascular access device: Secondary | ICD-10-CM | POA: Diagnosis not present

## 2017-12-11 ENCOUNTER — Ambulatory Visit (INDEPENDENT_AMBULATORY_CARE_PROVIDER_SITE_OTHER): Payer: Medicare Other | Admitting: Psychology

## 2017-12-11 DIAGNOSIS — F332 Major depressive disorder, recurrent severe without psychotic features: Secondary | ICD-10-CM

## 2017-12-19 DIAGNOSIS — F3342 Major depressive disorder, recurrent, in full remission: Secondary | ICD-10-CM | POA: Diagnosis not present

## 2017-12-25 DIAGNOSIS — M5416 Radiculopathy, lumbar region: Secondary | ICD-10-CM | POA: Diagnosis not present

## 2017-12-25 DIAGNOSIS — M5136 Other intervertebral disc degeneration, lumbar region: Secondary | ICD-10-CM | POA: Diagnosis not present

## 2017-12-25 DIAGNOSIS — M76899 Other specified enthesopathies of unspecified lower limb, excluding foot: Secondary | ICD-10-CM | POA: Diagnosis not present

## 2017-12-25 DIAGNOSIS — M47816 Spondylosis without myelopathy or radiculopathy, lumbar region: Secondary | ICD-10-CM | POA: Diagnosis not present

## 2017-12-25 DIAGNOSIS — M4316 Spondylolisthesis, lumbar region: Secondary | ICD-10-CM | POA: Diagnosis not present

## 2018-01-04 DIAGNOSIS — M76899 Other specified enthesopathies of unspecified lower limb, excluding foot: Secondary | ICD-10-CM | POA: Diagnosis not present

## 2018-01-07 ENCOUNTER — Ambulatory Visit (INDEPENDENT_AMBULATORY_CARE_PROVIDER_SITE_OTHER): Payer: Medicare Other | Admitting: Psychology

## 2018-01-07 DIAGNOSIS — F332 Major depressive disorder, recurrent severe without psychotic features: Secondary | ICD-10-CM

## 2018-01-09 DIAGNOSIS — F332 Major depressive disorder, recurrent severe without psychotic features: Secondary | ICD-10-CM | POA: Diagnosis not present

## 2018-01-15 DIAGNOSIS — S01312A Laceration without foreign body of left ear, initial encounter: Secondary | ICD-10-CM | POA: Diagnosis not present

## 2018-01-16 DIAGNOSIS — I10 Essential (primary) hypertension: Secondary | ICD-10-CM | POA: Diagnosis not present

## 2018-01-16 DIAGNOSIS — M47816 Spondylosis without myelopathy or radiculopathy, lumbar region: Secondary | ICD-10-CM | POA: Diagnosis not present

## 2018-01-16 DIAGNOSIS — M5136 Other intervertebral disc degeneration, lumbar region: Secondary | ICD-10-CM | POA: Diagnosis not present

## 2018-01-21 ENCOUNTER — Ambulatory Visit (INDEPENDENT_AMBULATORY_CARE_PROVIDER_SITE_OTHER): Payer: Medicare Other | Admitting: Psychology

## 2018-01-21 DIAGNOSIS — F332 Major depressive disorder, recurrent severe without psychotic features: Secondary | ICD-10-CM

## 2018-02-11 DIAGNOSIS — Z452 Encounter for adjustment and management of vascular access device: Secondary | ICD-10-CM | POA: Diagnosis not present

## 2018-02-15 DIAGNOSIS — R296 Repeated falls: Secondary | ICD-10-CM | POA: Diagnosis not present

## 2018-02-15 DIAGNOSIS — H6012 Cellulitis of left external ear: Secondary | ICD-10-CM | POA: Diagnosis not present

## 2018-02-22 ENCOUNTER — Ambulatory Visit (INDEPENDENT_AMBULATORY_CARE_PROVIDER_SITE_OTHER): Payer: Medicare Other | Admitting: Psychology

## 2018-02-22 DIAGNOSIS — F332 Major depressive disorder, recurrent severe without psychotic features: Secondary | ICD-10-CM | POA: Diagnosis not present

## 2018-02-27 DIAGNOSIS — I10 Essential (primary) hypertension: Secondary | ICD-10-CM | POA: Diagnosis not present

## 2018-02-27 DIAGNOSIS — H6012 Cellulitis of left external ear: Secondary | ICD-10-CM | POA: Diagnosis not present

## 2018-02-27 DIAGNOSIS — R296 Repeated falls: Secondary | ICD-10-CM | POA: Diagnosis not present

## 2018-03-01 DIAGNOSIS — R269 Unspecified abnormalities of gait and mobility: Secondary | ICD-10-CM | POA: Diagnosis not present

## 2018-03-01 DIAGNOSIS — M6281 Muscle weakness (generalized): Secondary | ICD-10-CM | POA: Diagnosis not present

## 2018-03-05 DIAGNOSIS — H6012 Cellulitis of left external ear: Secondary | ICD-10-CM | POA: Diagnosis not present

## 2018-03-05 DIAGNOSIS — Z9181 History of falling: Secondary | ICD-10-CM | POA: Diagnosis not present

## 2018-03-06 DIAGNOSIS — N189 Chronic kidney disease, unspecified: Secondary | ICD-10-CM | POA: Diagnosis not present

## 2018-03-06 DIAGNOSIS — F332 Major depressive disorder, recurrent severe without psychotic features: Secondary | ICD-10-CM | POA: Diagnosis not present

## 2018-03-06 DIAGNOSIS — I129 Hypertensive chronic kidney disease with stage 1 through stage 4 chronic kidney disease, or unspecified chronic kidney disease: Secondary | ICD-10-CM | POA: Diagnosis not present

## 2018-03-06 DIAGNOSIS — F333 Major depressive disorder, recurrent, severe with psychotic symptoms: Secondary | ICD-10-CM | POA: Diagnosis not present

## 2018-03-13 ENCOUNTER — Ambulatory Visit (INDEPENDENT_AMBULATORY_CARE_PROVIDER_SITE_OTHER): Payer: Medicare Other | Admitting: Psychology

## 2018-03-13 DIAGNOSIS — N189 Chronic kidney disease, unspecified: Secondary | ICD-10-CM | POA: Diagnosis not present

## 2018-03-13 DIAGNOSIS — F332 Major depressive disorder, recurrent severe without psychotic features: Secondary | ICD-10-CM | POA: Diagnosis not present

## 2018-03-27 ENCOUNTER — Ambulatory Visit: Payer: Medicare Other | Admitting: Psychology

## 2018-03-29 ENCOUNTER — Ambulatory Visit (INDEPENDENT_AMBULATORY_CARE_PROVIDER_SITE_OTHER): Payer: Medicare Other | Admitting: Psychology

## 2018-03-29 DIAGNOSIS — F332 Major depressive disorder, recurrent severe without psychotic features: Secondary | ICD-10-CM

## 2018-04-03 DIAGNOSIS — Z79891 Long term (current) use of opiate analgesic: Secondary | ICD-10-CM | POA: Diagnosis not present

## 2018-04-03 DIAGNOSIS — M47816 Spondylosis without myelopathy or radiculopathy, lumbar region: Secondary | ICD-10-CM | POA: Diagnosis not present

## 2018-04-03 DIAGNOSIS — M5136 Other intervertebral disc degeneration, lumbar region: Secondary | ICD-10-CM | POA: Diagnosis not present

## 2018-04-03 DIAGNOSIS — I10 Essential (primary) hypertension: Secondary | ICD-10-CM | POA: Diagnosis not present

## 2018-04-05 ENCOUNTER — Ambulatory Visit: Payer: Medicare Other | Admitting: Psychology

## 2018-04-15 DIAGNOSIS — D649 Anemia, unspecified: Secondary | ICD-10-CM | POA: Diagnosis not present

## 2018-04-15 DIAGNOSIS — M3210 Systemic lupus erythematosus, organ or system involvement unspecified: Secondary | ICD-10-CM | POA: Diagnosis not present

## 2018-04-15 DIAGNOSIS — I10 Essential (primary) hypertension: Secondary | ICD-10-CM | POA: Diagnosis not present

## 2018-04-15 DIAGNOSIS — N189 Chronic kidney disease, unspecified: Secondary | ICD-10-CM | POA: Diagnosis not present

## 2018-04-15 DIAGNOSIS — N183 Chronic kidney disease, stage 3 (moderate): Secondary | ICD-10-CM | POA: Diagnosis not present

## 2018-04-15 DIAGNOSIS — R809 Proteinuria, unspecified: Secondary | ICD-10-CM | POA: Diagnosis not present

## 2018-04-15 DIAGNOSIS — R309 Painful micturition, unspecified: Secondary | ICD-10-CM | POA: Diagnosis not present

## 2018-04-15 DIAGNOSIS — E119 Type 2 diabetes mellitus without complications: Secondary | ICD-10-CM | POA: Diagnosis not present

## 2018-04-19 DIAGNOSIS — I10 Essential (primary) hypertension: Secondary | ICD-10-CM | POA: Diagnosis not present

## 2018-04-22 DIAGNOSIS — N2889 Other specified disorders of kidney and ureter: Secondary | ICD-10-CM | POA: Diagnosis not present

## 2018-04-22 DIAGNOSIS — R809 Proteinuria, unspecified: Secondary | ICD-10-CM | POA: Diagnosis not present

## 2018-04-22 DIAGNOSIS — R801 Persistent proteinuria, unspecified: Secondary | ICD-10-CM | POA: Diagnosis not present

## 2018-04-23 DIAGNOSIS — F3342 Major depressive disorder, recurrent, in full remission: Secondary | ICD-10-CM | POA: Diagnosis not present

## 2018-04-24 DIAGNOSIS — F3341 Major depressive disorder, recurrent, in partial remission: Secondary | ICD-10-CM | POA: Diagnosis not present

## 2018-05-08 ENCOUNTER — Ambulatory Visit (INDEPENDENT_AMBULATORY_CARE_PROVIDER_SITE_OTHER): Payer: Medicare Other | Admitting: Psychology

## 2018-05-08 DIAGNOSIS — F332 Major depressive disorder, recurrent severe without psychotic features: Secondary | ICD-10-CM | POA: Diagnosis not present

## 2018-05-23 ENCOUNTER — Ambulatory Visit (INDEPENDENT_AMBULATORY_CARE_PROVIDER_SITE_OTHER): Payer: Medicare Other | Admitting: Psychology

## 2018-05-23 DIAGNOSIS — F332 Major depressive disorder, recurrent severe without psychotic features: Secondary | ICD-10-CM | POA: Diagnosis not present

## 2018-06-05 DIAGNOSIS — F3341 Major depressive disorder, recurrent, in partial remission: Secondary | ICD-10-CM | POA: Diagnosis not present

## 2018-06-05 DIAGNOSIS — N189 Chronic kidney disease, unspecified: Secondary | ICD-10-CM | POA: Diagnosis not present

## 2018-06-05 DIAGNOSIS — I129 Hypertensive chronic kidney disease with stage 1 through stage 4 chronic kidney disease, or unspecified chronic kidney disease: Secondary | ICD-10-CM | POA: Diagnosis not present

## 2018-06-12 ENCOUNTER — Ambulatory Visit (INDEPENDENT_AMBULATORY_CARE_PROVIDER_SITE_OTHER): Payer: Medicare Other | Admitting: Psychology

## 2018-06-12 DIAGNOSIS — F332 Major depressive disorder, recurrent severe without psychotic features: Secondary | ICD-10-CM

## 2018-06-14 DIAGNOSIS — H2513 Age-related nuclear cataract, bilateral: Secondary | ICD-10-CM | POA: Diagnosis not present

## 2018-06-19 DIAGNOSIS — M47816 Spondylosis without myelopathy or radiculopathy, lumbar region: Secondary | ICD-10-CM | POA: Diagnosis not present

## 2018-06-19 DIAGNOSIS — M5136 Other intervertebral disc degeneration, lumbar region: Secondary | ICD-10-CM | POA: Diagnosis not present

## 2018-06-19 DIAGNOSIS — Z79891 Long term (current) use of opiate analgesic: Secondary | ICD-10-CM | POA: Diagnosis not present

## 2018-07-01 DIAGNOSIS — L03114 Cellulitis of left upper limb: Secondary | ICD-10-CM | POA: Diagnosis not present

## 2018-07-01 DIAGNOSIS — Z23 Encounter for immunization: Secondary | ICD-10-CM | POA: Diagnosis not present

## 2018-07-10 DIAGNOSIS — I10 Essential (primary) hypertension: Secondary | ICD-10-CM | POA: Diagnosis not present

## 2018-07-10 DIAGNOSIS — N183 Chronic kidney disease, stage 3 (moderate): Secondary | ICD-10-CM | POA: Diagnosis not present

## 2018-07-10 DIAGNOSIS — N189 Chronic kidney disease, unspecified: Secondary | ICD-10-CM | POA: Diagnosis not present

## 2018-07-29 ENCOUNTER — Ambulatory Visit: Payer: Medicare Other | Admitting: Psychology

## 2018-08-02 DIAGNOSIS — Z79890 Hormone replacement therapy (postmenopausal): Secondary | ICD-10-CM | POA: Diagnosis not present

## 2018-08-02 DIAGNOSIS — E785 Hyperlipidemia, unspecified: Secondary | ICD-10-CM | POA: Diagnosis not present

## 2018-08-02 DIAGNOSIS — E039 Hypothyroidism, unspecified: Secondary | ICD-10-CM | POA: Diagnosis not present

## 2018-08-02 DIAGNOSIS — I129 Hypertensive chronic kidney disease with stage 1 through stage 4 chronic kidney disease, or unspecified chronic kidney disease: Secondary | ICD-10-CM | POA: Diagnosis not present

## 2018-08-02 DIAGNOSIS — E213 Hyperparathyroidism, unspecified: Secondary | ICD-10-CM | POA: Diagnosis not present

## 2018-08-02 DIAGNOSIS — Z888 Allergy status to other drugs, medicaments and biological substances status: Secondary | ICD-10-CM | POA: Diagnosis not present

## 2018-08-02 DIAGNOSIS — N189 Chronic kidney disease, unspecified: Secondary | ICD-10-CM | POA: Diagnosis not present

## 2018-08-02 DIAGNOSIS — F3341 Major depressive disorder, recurrent, in partial remission: Secondary | ICD-10-CM | POA: Diagnosis not present

## 2018-08-02 DIAGNOSIS — M1990 Unspecified osteoarthritis, unspecified site: Secondary | ICD-10-CM | POA: Diagnosis not present

## 2018-08-02 DIAGNOSIS — F419 Anxiety disorder, unspecified: Secondary | ICD-10-CM | POA: Diagnosis not present

## 2018-08-02 DIAGNOSIS — I440 Atrioventricular block, first degree: Secondary | ICD-10-CM | POA: Diagnosis not present

## 2018-08-02 DIAGNOSIS — Z79899 Other long term (current) drug therapy: Secondary | ICD-10-CM | POA: Diagnosis not present

## 2018-08-02 DIAGNOSIS — Z01818 Encounter for other preprocedural examination: Secondary | ICD-10-CM | POA: Diagnosis not present

## 2018-08-13 ENCOUNTER — Ambulatory Visit (INDEPENDENT_AMBULATORY_CARE_PROVIDER_SITE_OTHER): Payer: Medicare Other | Admitting: Psychology

## 2018-08-13 DIAGNOSIS — F3342 Major depressive disorder, recurrent, in full remission: Secondary | ICD-10-CM | POA: Diagnosis not present

## 2018-08-13 DIAGNOSIS — F332 Major depressive disorder, recurrent severe without psychotic features: Secondary | ICD-10-CM | POA: Diagnosis not present

## 2018-08-28 ENCOUNTER — Ambulatory Visit: Payer: Medicare Other | Admitting: Psychology

## 2018-09-05 DIAGNOSIS — M47816 Spondylosis without myelopathy or radiculopathy, lumbar region: Secondary | ICD-10-CM | POA: Diagnosis not present

## 2018-09-05 DIAGNOSIS — I10 Essential (primary) hypertension: Secondary | ICD-10-CM | POA: Diagnosis not present

## 2018-09-05 DIAGNOSIS — M5136 Other intervertebral disc degeneration, lumbar region: Secondary | ICD-10-CM | POA: Diagnosis not present

## 2018-09-05 DIAGNOSIS — Z79891 Long term (current) use of opiate analgesic: Secondary | ICD-10-CM | POA: Diagnosis not present

## 2018-09-16 ENCOUNTER — Ambulatory Visit: Payer: Medicare Other | Admitting: Psychology

## 2018-09-16 DIAGNOSIS — N189 Chronic kidney disease, unspecified: Secondary | ICD-10-CM | POA: Diagnosis not present

## 2018-09-16 DIAGNOSIS — Z79899 Other long term (current) drug therapy: Secondary | ICD-10-CM | POA: Diagnosis not present

## 2018-09-16 DIAGNOSIS — E213 Hyperparathyroidism, unspecified: Secondary | ICD-10-CM | POA: Diagnosis not present

## 2018-09-16 DIAGNOSIS — E785 Hyperlipidemia, unspecified: Secondary | ICD-10-CM | POA: Diagnosis not present

## 2018-09-16 DIAGNOSIS — F3341 Major depressive disorder, recurrent, in partial remission: Secondary | ICD-10-CM | POA: Diagnosis not present

## 2018-09-16 DIAGNOSIS — M1990 Unspecified osteoarthritis, unspecified site: Secondary | ICD-10-CM | POA: Diagnosis not present

## 2018-09-16 DIAGNOSIS — I129 Hypertensive chronic kidney disease with stage 1 through stage 4 chronic kidney disease, or unspecified chronic kidney disease: Secondary | ICD-10-CM | POA: Diagnosis not present

## 2018-09-16 DIAGNOSIS — E039 Hypothyroidism, unspecified: Secondary | ICD-10-CM | POA: Diagnosis not present

## 2018-09-16 DIAGNOSIS — M810 Age-related osteoporosis without current pathological fracture: Secondary | ICD-10-CM | POA: Diagnosis not present

## 2018-09-16 DIAGNOSIS — Z881 Allergy status to other antibiotic agents status: Secondary | ICD-10-CM | POA: Diagnosis not present

## 2018-09-16 DIAGNOSIS — F419 Anxiety disorder, unspecified: Secondary | ICD-10-CM | POA: Diagnosis not present

## 2018-10-18 DIAGNOSIS — R269 Unspecified abnormalities of gait and mobility: Secondary | ICD-10-CM | POA: Diagnosis not present

## 2018-10-18 DIAGNOSIS — R413 Other amnesia: Secondary | ICD-10-CM | POA: Diagnosis not present

## 2018-10-22 DIAGNOSIS — I10 Essential (primary) hypertension: Secondary | ICD-10-CM | POA: Diagnosis not present

## 2018-10-22 DIAGNOSIS — R0989 Other specified symptoms and signs involving the circulatory and respiratory systems: Secondary | ICD-10-CM | POA: Diagnosis not present

## 2018-10-22 DIAGNOSIS — R9431 Abnormal electrocardiogram [ECG] [EKG]: Secondary | ICD-10-CM | POA: Diagnosis not present

## 2018-10-24 DIAGNOSIS — R29898 Other symptoms and signs involving the musculoskeletal system: Secondary | ICD-10-CM | POA: Diagnosis not present

## 2018-10-28 DIAGNOSIS — M5127 Other intervertebral disc displacement, lumbosacral region: Secondary | ICD-10-CM | POA: Diagnosis not present

## 2018-10-28 DIAGNOSIS — M4316 Spondylolisthesis, lumbar region: Secondary | ICD-10-CM | POA: Diagnosis not present

## 2018-10-28 DIAGNOSIS — M545 Low back pain: Secondary | ICD-10-CM | POA: Diagnosis not present

## 2018-10-28 DIAGNOSIS — M48061 Spinal stenosis, lumbar region without neurogenic claudication: Secondary | ICD-10-CM | POA: Diagnosis not present

## 2018-10-28 DIAGNOSIS — R413 Other amnesia: Secondary | ICD-10-CM | POA: Diagnosis not present

## 2018-10-28 DIAGNOSIS — R2689 Other abnormalities of gait and mobility: Secondary | ICD-10-CM | POA: Diagnosis not present

## 2018-10-28 DIAGNOSIS — M47816 Spondylosis without myelopathy or radiculopathy, lumbar region: Secondary | ICD-10-CM | POA: Diagnosis not present

## 2018-10-28 DIAGNOSIS — I6782 Cerebral ischemia: Secondary | ICD-10-CM | POA: Diagnosis not present

## 2018-10-30 DIAGNOSIS — E213 Hyperparathyroidism, unspecified: Secondary | ICD-10-CM | POA: Diagnosis not present

## 2018-10-30 DIAGNOSIS — F419 Anxiety disorder, unspecified: Secondary | ICD-10-CM | POA: Diagnosis not present

## 2018-10-30 DIAGNOSIS — E785 Hyperlipidemia, unspecified: Secondary | ICD-10-CM | POA: Diagnosis not present

## 2018-10-30 DIAGNOSIS — M5136 Other intervertebral disc degeneration, lumbar region: Secondary | ICD-10-CM | POA: Diagnosis not present

## 2018-10-30 DIAGNOSIS — Z791 Long term (current) use of non-steroidal anti-inflammatories (NSAID): Secondary | ICD-10-CM | POA: Diagnosis not present

## 2018-10-30 DIAGNOSIS — F3341 Major depressive disorder, recurrent, in partial remission: Secondary | ICD-10-CM | POA: Diagnosis not present

## 2018-10-30 DIAGNOSIS — Z79899 Other long term (current) drug therapy: Secondary | ICD-10-CM | POA: Diagnosis not present

## 2018-10-30 DIAGNOSIS — Z79891 Long term (current) use of opiate analgesic: Secondary | ICD-10-CM | POA: Diagnosis not present

## 2018-10-30 DIAGNOSIS — M4316 Spondylolisthesis, lumbar region: Secondary | ICD-10-CM | POA: Diagnosis not present

## 2018-10-30 DIAGNOSIS — N189 Chronic kidney disease, unspecified: Secondary | ICD-10-CM | POA: Diagnosis not present

## 2018-10-30 DIAGNOSIS — I10 Essential (primary) hypertension: Secondary | ICD-10-CM | POA: Diagnosis not present

## 2018-10-30 DIAGNOSIS — Z808 Family history of malignant neoplasm of other organs or systems: Secondary | ICD-10-CM | POA: Diagnosis not present

## 2018-10-30 DIAGNOSIS — Z881 Allergy status to other antibiotic agents status: Secondary | ICD-10-CM | POA: Diagnosis not present

## 2018-10-30 DIAGNOSIS — Z8249 Family history of ischemic heart disease and other diseases of the circulatory system: Secondary | ICD-10-CM | POA: Diagnosis not present

## 2018-10-30 DIAGNOSIS — Z823 Family history of stroke: Secondary | ICD-10-CM | POA: Diagnosis not present

## 2018-10-30 DIAGNOSIS — Z79890 Hormone replacement therapy (postmenopausal): Secondary | ICD-10-CM | POA: Diagnosis not present

## 2018-10-30 DIAGNOSIS — I129 Hypertensive chronic kidney disease with stage 1 through stage 4 chronic kidney disease, or unspecified chronic kidney disease: Secondary | ICD-10-CM | POA: Diagnosis not present

## 2018-10-30 DIAGNOSIS — M810 Age-related osteoporosis without current pathological fracture: Secondary | ICD-10-CM | POA: Diagnosis not present

## 2018-10-30 DIAGNOSIS — M1990 Unspecified osteoarthritis, unspecified site: Secondary | ICD-10-CM | POA: Diagnosis not present

## 2018-10-30 DIAGNOSIS — E039 Hypothyroidism, unspecified: Secondary | ICD-10-CM | POA: Diagnosis not present

## 2018-11-11 DIAGNOSIS — I10 Essential (primary) hypertension: Secondary | ICD-10-CM | POA: Diagnosis not present

## 2018-11-11 DIAGNOSIS — R0989 Other specified symptoms and signs involving the circulatory and respiratory systems: Secondary | ICD-10-CM | POA: Diagnosis not present

## 2018-11-15 DIAGNOSIS — F3342 Major depressive disorder, recurrent, in full remission: Secondary | ICD-10-CM | POA: Diagnosis not present

## 2018-11-18 ENCOUNTER — Ambulatory Visit (INDEPENDENT_AMBULATORY_CARE_PROVIDER_SITE_OTHER): Payer: Medicare Other | Admitting: Psychology

## 2018-11-18 DIAGNOSIS — F332 Major depressive disorder, recurrent severe without psychotic features: Secondary | ICD-10-CM | POA: Diagnosis not present

## 2018-11-20 DIAGNOSIS — M48061 Spinal stenosis, lumbar region without neurogenic claudication: Secondary | ICD-10-CM | POA: Diagnosis not present

## 2018-11-20 DIAGNOSIS — M47816 Spondylosis without myelopathy or radiculopathy, lumbar region: Secondary | ICD-10-CM | POA: Diagnosis not present

## 2018-11-20 DIAGNOSIS — M5136 Other intervertebral disc degeneration, lumbar region: Secondary | ICD-10-CM | POA: Diagnosis not present

## 2018-11-20 DIAGNOSIS — M546 Pain in thoracic spine: Secondary | ICD-10-CM | POA: Diagnosis not present

## 2018-11-20 DIAGNOSIS — Z79891 Long term (current) use of opiate analgesic: Secondary | ICD-10-CM | POA: Diagnosis not present

## 2018-11-20 DIAGNOSIS — S22080A Wedge compression fracture of T11-T12 vertebra, initial encounter for closed fracture: Secondary | ICD-10-CM | POA: Diagnosis not present

## 2018-12-06 ENCOUNTER — Ambulatory Visit: Payer: Medicare Other | Admitting: Psychology

## 2019-01-20 DIAGNOSIS — M48061 Spinal stenosis, lumbar region without neurogenic claudication: Secondary | ICD-10-CM | POA: Diagnosis not present

## 2019-01-20 DIAGNOSIS — M5136 Other intervertebral disc degeneration, lumbar region: Secondary | ICD-10-CM | POA: Diagnosis not present

## 2019-01-20 DIAGNOSIS — Z79891 Long term (current) use of opiate analgesic: Secondary | ICD-10-CM | POA: Diagnosis not present

## 2019-01-20 DIAGNOSIS — M47816 Spondylosis without myelopathy or radiculopathy, lumbar region: Secondary | ICD-10-CM | POA: Diagnosis not present

## 2019-02-03 DIAGNOSIS — N189 Chronic kidney disease, unspecified: Secondary | ICD-10-CM | POA: Diagnosis not present

## 2019-02-03 DIAGNOSIS — F339 Major depressive disorder, recurrent, unspecified: Secondary | ICD-10-CM | POA: Diagnosis not present

## 2019-02-03 DIAGNOSIS — E039 Hypothyroidism, unspecified: Secondary | ICD-10-CM | POA: Diagnosis not present

## 2019-02-03 DIAGNOSIS — E785 Hyperlipidemia, unspecified: Secondary | ICD-10-CM | POA: Diagnosis not present

## 2019-02-03 DIAGNOSIS — M8580 Other specified disorders of bone density and structure, unspecified site: Secondary | ICD-10-CM | POA: Diagnosis not present

## 2019-02-03 DIAGNOSIS — I10 Essential (primary) hypertension: Secondary | ICD-10-CM | POA: Diagnosis not present

## 2019-02-03 DIAGNOSIS — M47816 Spondylosis without myelopathy or radiculopathy, lumbar region: Secondary | ICD-10-CM | POA: Diagnosis not present

## 2019-02-05 DIAGNOSIS — I10 Essential (primary) hypertension: Secondary | ICD-10-CM | POA: Diagnosis not present

## 2019-02-05 DIAGNOSIS — M48062 Spinal stenosis, lumbar region with neurogenic claudication: Secondary | ICD-10-CM | POA: Diagnosis not present

## 2019-02-05 DIAGNOSIS — M5116 Intervertebral disc disorders with radiculopathy, lumbar region: Secondary | ICD-10-CM | POA: Diagnosis not present

## 2019-02-05 DIAGNOSIS — M4726 Other spondylosis with radiculopathy, lumbar region: Secondary | ICD-10-CM | POA: Diagnosis not present

## 2019-02-05 DIAGNOSIS — Z79891 Long term (current) use of opiate analgesic: Secondary | ICD-10-CM | POA: Diagnosis not present

## 2019-02-11 DIAGNOSIS — M4316 Spondylolisthesis, lumbar region: Secondary | ICD-10-CM | POA: Diagnosis not present

## 2019-02-12 ENCOUNTER — Other Ambulatory Visit: Payer: Self-pay | Admitting: Neurosurgery

## 2019-02-24 HISTORY — PX: DECOMPRESSIVE LUMBAR LAMINECTOMY LEVEL 4: SHX5794

## 2019-03-12 NOTE — Progress Notes (Signed)
[id number] - [place], [place] - [address] [place] [address] Phone: [phone] Fax: [phone]      Your procedure is scheduled on Monday March 17, 2019.  Report to [facility] Main Entrance "A" at 0530 A.M., and check in at the Admitting office.  Call this number if you have problems the morning of surgery:  [phone]  Call [phone] if you have any questions prior to your surgery date Monday-Friday 8am-4pm    Remember:  Do not eat or drink after midnight.    Take these medicines the morning of surgery with A SIP OF WATER: Amlodipine (Norvasc) Aripiprazole (Abilify) Bupropion (Wellbutrin XL) Clonazepam (Klonopin) Duloxetine (Cymbalta) Synthroid  7 days prior to surgery STOP taking any Aspirin (unless otherwise instructed by your surgeon), Aleve, Naproxen, Ibuprofen, Motrin, Advil, Goody's, BC's, all herbal medications, fish oil, and all vitamins.    The Morning of Surgery  Do not wear jewelry, make-up or nail polish.  Do not wear lotions, powders, or perfumes/colognes, or deodorant  Do not shave 48 hours prior to surgery.  Men may shave face and neck.  Do not bring valuables to the hospital.  [facility] is not responsible for any belongings or valuables.  If you are a smoker, DO NOT Smoke 24 hours prior to surgery IF you wear a CPAP at night please bring your mask, tubing, and machine the morning of surgery   Remember that you must have someone to transport you home after your surgery, and remain with you for 24 hours if you are discharged the same day.   Contacts, glasses, hearing aids, dentures or bridgework may not be worn into surgery.    Leave your suitcase in the car.  After surgery it may be brought to your room.  For patients admitted to the hospital, discharge time will be determined by your treatment team.  Patients discharged the day of surgery will not be  allowed to drive home.    Special instructions:   [facility]- Preparing For Surgery  Before surgery, you can play an important role. Because skin is not sterile, your skin needs to be as free of germs as possible. You can reduce the number of germs on your skin by washing with CHG (chlorahexidine gluconate) Soap before surgery.  CHG is an antiseptic cleaner which kills germs and bonds with the skin to continue killing germs even after washing.    Oral Hygiene is also important to reduce your risk of infection.  Remember - BRUSH YOUR TEETH THE MORNING OF SURGERY WITH YOUR REGULAR TOOTHPASTE  Please do not use if you have an allergy to CHG or antibacterial soaps. If your skin becomes reddened/irritated stop using the CHG.  Do not shave (including legs and underarms) for at least 48 hours prior to first CHG shower. It is OK to shave your face.  Please follow these instructions carefully.   1. Shower the NIGHT BEFORE SURGERY and the MORNING OF SURGERY with CHG Soap.   2. If you chose to wash your hair, wash your hair first as usual with your normal shampoo.  3. After you shampoo, rinse your hair and body thoroughly to remove the shampoo.  4. Use CHG as you would any other liquid soap. You can apply CHG directly to the skin and wash gently with a scrungie or a clean washcloth.   5. Apply the CHG Soap to your  body ONLY FROM THE NECK DOWN.  Do not use on open wounds or open sores. Avoid contact with your eyes, ears, mouth and genitals (private parts). Wash Face and genitals (private parts)  with your normal soap.   6. Wash thoroughly, paying special attention to the area where your surgery will be performed.  7. Thoroughly rinse your body with warm water from the neck down.  8. DO NOT shower/wash with your normal soap after using and rinsing off the CHG Soap.  9. Pat yourself dry with a CLEAN TOWEL.  10. Wear CLEAN PAJAMAS to bed the night before surgery, wear comfortable clothes the  morning of surgery  11. Place CLEAN SHEETS on your bed the night of your first shower and DO NOT SLEEP WITH PETS.    Day of Surgery: Shower as stated above Do not apply any deodorants/lotions.  Please wear clean clothes to the hospital/surgery center.   Remember to brush your teeth WITH YOUR REGULAR TOOTHPASTE.   Please read over the following fact sheets that you were given.

## 2019-03-13 ENCOUNTER — Encounter (HOSPITAL_COMMUNITY): Payer: Self-pay

## 2019-03-13 ENCOUNTER — Other Ambulatory Visit: Payer: Self-pay

## 2019-03-13 ENCOUNTER — Encounter (HOSPITAL_COMMUNITY)
Admission: RE | Admit: 2019-03-13 | Discharge: 2019-03-13 | Disposition: A | Discharge status: Home / Self Care | Payer: Medicare Other | Source: Ambulatory Visit | Attending: Neurosurgery | Admitting: Neurosurgery

## 2019-03-13 ENCOUNTER — Other Ambulatory Visit (HOSPITAL_COMMUNITY)
Admission: RE | Admit: 2019-03-13 | Discharge: 2019-03-13 | Disposition: A | Discharge status: Home / Self Care | Payer: Medicare Other | Source: Ambulatory Visit | Attending: Neurosurgery | Admitting: Neurosurgery

## 2019-03-13 DIAGNOSIS — Z01812 Encounter for preprocedural laboratory examination: Secondary | ICD-10-CM | POA: Diagnosis not present

## 2019-03-13 DIAGNOSIS — Z1159 Encounter for screening for other viral diseases: Secondary | ICD-10-CM | POA: Diagnosis not present

## 2019-03-13 HISTORY — DX: Hypothyroidism, unspecified: E03.9

## 2019-03-13 HISTORY — DX: Pure hypercholesterolemia, unspecified: E78.00

## 2019-03-13 HISTORY — DX: Unspecified osteoarthritis, unspecified site: M19.90

## 2019-03-13 HISTORY — DX: Essential (primary) hypertension: I10

## 2019-03-13 HISTORY — DX: Anxiety disorder, unspecified: F41.9

## 2019-03-13 LAB — BASIC METABOLIC PANEL
Anion gap: 9 (ref 5–15)
BUN: 11 mg/dL (ref 8–23)
CO2: 23 mmol/L (ref 22–32)
Calcium: 9.9 mg/dL (ref 8.9–10.3)
Chloride: 107 mmol/L (ref 98–111)
Creatinine, Ser: 1.07 mg/dL — ABNORMAL HIGH (ref 0.44–1.00)
GFR calc Af Amer: 58 mL/min — ABNORMAL LOW (ref >60)
GFR calc non Af Amer: 50 mL/min — ABNORMAL LOW (ref >60)
Glucose, Bld: 102 mg/dL — ABNORMAL HIGH (ref 70–99)
Potassium: 3.6 mmol/L (ref 3.5–5.1)
Sodium: 139 mmol/L (ref 135–145)

## 2019-03-13 LAB — CBC
HCT: 47.3 % — ABNORMAL HIGH (ref 36.0–46.0)
Hemoglobin: 15.8 g/dL — ABNORMAL HIGH (ref 12.0–15.0)
MCH: 31.9 pg (ref 26.0–34.0)
MCHC: 33.4 g/dL (ref 30.0–36.0)
MCV: 95.6 fL (ref 80.0–100.0)
Platelets: 288 10*3/uL (ref 150–400)
RBC: 4.95 MIL/uL (ref 3.87–5.11)
RDW: 12.3 % (ref 11.5–15.5)
WBC: 7.6 10*3/uL (ref 4.0–10.5)
nRBC: 0 % (ref 0.0–0.2)

## 2019-03-13 LAB — SURGICAL PCR SCREEN
MRSA, PCR: NEGATIVE
Staphylococcus aureus: POSITIVE — AB

## 2019-03-13 LAB — TYPE AND SCREEN
ABO/RH(D): O POS
Antibody Screen: NEGATIVE

## 2019-03-13 LAB — ABO/RH: ABO/RH(D): O POS

## 2019-03-13 NOTE — Progress Notes (Signed)
PCP - Dr, Hulan Fess Cardiologist - patient denies  Chest x-ray - n/a EKG - 07/2018; tracing requested Stress Test - patient denies ECHO - patient denies Cardiac Cath - patient denies  Sleep Study - patient denies CPAP -   Fasting Blood Sugar - n/a Checks Blood Sugar _____ times a day  Blood Thinner Instructions: n/a Aspirin Instructions: n/a  Anesthesia review: yes, abnormal EKG, tracing requested  Patient denies shortness of breath, fever, cough and chest pain at PAT appointment   Coronavirus Screening  Have you experienced the following symptoms:  Cough yes/no: No Fever (>100.1F)  yes/no: No Runny nose yes/no: No Sore throat yes/no: No Difficulty breathing/shortness of breath  yes/no: No  Have you or a family member traveled in the last 14 days and where? yes/no: No   If the patient indicates "YES" to the above questions, their PAT will be rescheduled to limit the exposure to others and, the surgeon will be notified. THE PATIENT WILL NEED TO BE ASYMPTOMATIC FOR 14 DAYS.   If the patient is not experiencing any of these symptoms, the PAT nurse will instruct them to NOT bring anyone with them to their appointment since they may have these symptoms or traveled as well.   Please remind your patients and families that hospital visitation restrictions are in effect and the importance of the restrictions.    Patient verbalized understanding of instructions that were given to them at the PAT appointment. Patient was also instructed that they will need to review over the PAT instructions again at home before surgery.

## 2019-03-14 LAB — SARS CORONAVIRUS 2 (TAT 6-24 HRS): SARS Coronavirus 2: NEGATIVE

## 2019-03-14 NOTE — Anesthesia Preprocedure Evaluation (Addendum)
Anesthesia Evaluation  Patient identified by MRN, date of birth, ID band Patient awake    Reviewed: Allergy & Precautions, NPO status , Patient's Chart, lab work & pertinent test results  Airway Mallampati: I  TM Distance: >3 FB Neck ROM: Full    Dental   Pulmonary    Pulmonary exam normal        Cardiovascular hypertension, Pt. on medications Normal cardiovascular exam     Neuro/Psych Anxiety Depression    GI/Hepatic   Endo/Other    Renal/GU      Musculoskeletal   Abdominal   Peds  Hematology   Anesthesia Other Findings   Reproductive/Obstetrics                            Anesthesia Physical Anesthesia Plan  ASA: III  Anesthesia Plan: General   Post-op Pain Management:    Induction:   PONV Risk Score and Plan: 3 and Ondansetron, Midazolam and Dexamethasone  Airway Management Planned: Oral ETT  Additional Equipment:   Intra-op Plan:   Post-operative Plan: Extubation in OR  Informed Consent: I have reviewed the patients History and Physical, chart, labs and discussed the procedure including the risks, benefits and alternatives for the proposed anesthesia with the patient or authorized representative who has indicated his/her understanding and acceptance.       Plan Discussed with: CRNA and Surgeon  Anesthesia Plan Comments: (Severe depression followed at [facility] and treated with multiple medications as well as maintenance electroconvulsive therapy q 6-8 weeks. Also follows with neurology for mild cognitive impairment.  EKG 08/02/18 (copy on pt chart): NSR. Rate 62. 1st degree AV block. LVH.)       Anesthesia Quick Evaluation

## 2019-03-17 ENCOUNTER — Inpatient Hospital Stay (HOSPITAL_COMMUNITY): Payer: Medicare Other | Admitting: Anesthesiology

## 2019-03-17 ENCOUNTER — Inpatient Hospital Stay (HOSPITAL_COMMUNITY): Payer: Medicare Other | Admitting: Physician Assistant

## 2019-03-17 ENCOUNTER — Other Ambulatory Visit: Payer: Self-pay

## 2019-03-17 ENCOUNTER — Encounter (HOSPITAL_COMMUNITY)
Admission: RE | Disposition: A | Discharge status: Home Health | Payer: Self-pay | Source: Home / Self Care | Attending: Neurosurgery

## 2019-03-17 ENCOUNTER — Inpatient Hospital Stay (HOSPITAL_COMMUNITY)
Admission: RE | Admit: 2019-03-17 | Discharge: 2019-03-18 | DRG: 460 | Disposition: A | Discharge status: Home Health | Payer: Medicare Other | Attending: Neurosurgery | Admitting: Neurosurgery

## 2019-03-17 ENCOUNTER — Encounter (HOSPITAL_COMMUNITY): Payer: Self-pay | Admitting: *Deleted

## 2019-03-17 ENCOUNTER — Inpatient Hospital Stay (HOSPITAL_COMMUNITY): Payer: Medicare Other

## 2019-03-17 DIAGNOSIS — F329 Major depressive disorder, single episode, unspecified: Secondary | ICD-10-CM | POA: Diagnosis present

## 2019-03-17 DIAGNOSIS — E7800 Pure hypercholesterolemia, unspecified: Secondary | ICD-10-CM | POA: Diagnosis present

## 2019-03-17 DIAGNOSIS — M4316 Spondylolisthesis, lumbar region: Principal | ICD-10-CM | POA: Diagnosis present

## 2019-03-17 DIAGNOSIS — M48061 Spinal stenosis, lumbar region without neurogenic claudication: Secondary | ICD-10-CM | POA: Diagnosis present

## 2019-03-17 DIAGNOSIS — E039 Hypothyroidism, unspecified: Secondary | ICD-10-CM | POA: Diagnosis present

## 2019-03-17 DIAGNOSIS — Z79899 Other long term (current) drug therapy: Secondary | ICD-10-CM | POA: Diagnosis not present

## 2019-03-17 DIAGNOSIS — M7138 Other bursal cyst, other site: Secondary | ICD-10-CM | POA: Diagnosis present

## 2019-03-17 DIAGNOSIS — R402143 Coma scale, eyes open, spontaneous, at hospital admission: Secondary | ICD-10-CM | POA: Diagnosis present

## 2019-03-17 DIAGNOSIS — M47816 Spondylosis without myelopathy or radiculopathy, lumbar region: Secondary | ICD-10-CM | POA: Diagnosis present

## 2019-03-17 DIAGNOSIS — Z419 Encounter for procedure for purposes other than remedying health state, unspecified: Secondary | ICD-10-CM

## 2019-03-17 DIAGNOSIS — I10 Essential (primary) hypertension: Secondary | ICD-10-CM | POA: Diagnosis present

## 2019-03-17 DIAGNOSIS — F419 Anxiety disorder, unspecified: Secondary | ICD-10-CM | POA: Diagnosis present

## 2019-03-17 DIAGNOSIS — R402253 Coma scale, best verbal response, oriented, at hospital admission: Secondary | ICD-10-CM | POA: Diagnosis present

## 2019-03-17 DIAGNOSIS — F418 Other specified anxiety disorders: Secondary | ICD-10-CM | POA: Diagnosis not present

## 2019-03-17 DIAGNOSIS — M545 Low back pain: Secondary | ICD-10-CM | POA: Diagnosis not present

## 2019-03-17 DIAGNOSIS — R402363 Coma scale, best motor response, obeys commands, at hospital admission: Secondary | ICD-10-CM | POA: Diagnosis present

## 2019-03-17 DIAGNOSIS — Z79890 Hormone replacement therapy (postmenopausal): Secondary | ICD-10-CM | POA: Diagnosis not present

## 2019-03-17 SURGERY — POSTERIOR LUMBAR FUSION 1 LEVEL
Anesthesia: General | Site: Spine Lumbar

## 2019-03-17 MED ORDER — CHLORHEXIDINE GLUCONATE CLOTH 2 % EX PADS: 6.0000 | MEDICATED_PAD | Freq: Once | CUTANEOUS | Status: DC

## 2019-03-17 MED ORDER — LOSARTAN POTASSIUM 50 MG PO TABS
50.0000 mg | ORAL_TABLET | Freq: Every day | ORAL | Status: DC
  Filled 2019-03-17: qty 1

## 2019-03-17 MED ORDER — THROMBIN 5000 UNITS EX SOLR
CUTANEOUS | Status: AC
  Filled 2019-03-17: qty 5000

## 2019-03-17 MED ORDER — FENTANYL CITRATE (PF) 250 MCG/5ML IJ SOLN
INTRAMUSCULAR | Status: AC
  Filled 2019-03-17: qty 5

## 2019-03-17 MED ORDER — HYDROCODONE-ACETAMINOPHEN 5-325 MG PO TABS
ORAL_TABLET | ORAL | Status: AC
  Filled 2019-03-17: qty 1

## 2019-03-17 MED ORDER — ROCURONIUM BROMIDE 50 MG/5ML IV SOSY
PREFILLED_SYRINGE | INTRAVENOUS | Status: DC | PRN
  Administered 2019-03-17: 60 mg via INTRAVENOUS

## 2019-03-17 MED ORDER — ACETAMINOPHEN 10 MG/ML IV SOLN
INTRAVENOUS | Status: DC | PRN
  Administered 2019-03-17: 1000 mg via INTRAVENOUS

## 2019-03-17 MED ORDER — LIDOCAINE 2% (20 MG/ML) 5 ML SYRINGE
INTRAMUSCULAR | Status: DC | PRN
  Administered 2019-03-17: 100 mg via INTRAVENOUS

## 2019-03-17 MED ORDER — ONDANSETRON HCL 4 MG PO TABS: 4.0000 mg | ORAL_TABLET | Freq: Four times a day (QID) | ORAL | Status: DC | PRN

## 2019-03-17 MED ORDER — IBANDRONATE SODIUM 150 MG PO TABS: 150.0000 mg | ORAL_TABLET | ORAL | Status: DC

## 2019-03-17 MED ORDER — ROCURONIUM BROMIDE 10 MG/ML (PF) SYRINGE
PREFILLED_SYRINGE | INTRAVENOUS | Status: AC
  Filled 2019-03-17: qty 10

## 2019-03-17 MED ORDER — SODIUM CHLORIDE 0.9 % IV SOLN
INTRAVENOUS | Status: DC | PRN
  Administered 2019-03-17: 500 mL

## 2019-03-17 MED ORDER — BUPIVACAINE LIPOSOME 1.3 % IJ SUSP
20.0000 mL | INTRAMUSCULAR | Status: AC
  Administered 2019-03-17: 20 mL
  Filled 2019-03-17: qty 20

## 2019-03-17 MED ORDER — ALUM & MAG HYDROXIDE-SIMETH 200-200-20 MG/5ML PO SUSP: 30.0000 mL | Freq: Four times a day (QID) | ORAL | Status: DC | PRN

## 2019-03-17 MED ORDER — DEPLIN 15 15-90.314 MG PO CAPS: 15.0000 mg | ORAL_CAPSULE | Freq: Every day | ORAL | Status: DC

## 2019-03-17 MED ORDER — DEXAMETHASONE SODIUM PHOSPHATE 10 MG/ML IJ SOLN
INTRAMUSCULAR | Status: DC | PRN
  Administered 2019-03-17: 10 mg via INTRAVENOUS

## 2019-03-17 MED ORDER — MUPIROCIN 2 % EX OINT
1.0000 "application " | TOPICAL_OINTMENT | Freq: Two times a day (BID) | CUTANEOUS | Status: DC
  Administered 2019-03-17: 1 via NASAL
  Filled 2019-03-17: qty 22

## 2019-03-17 MED ORDER — ONDANSETRON HCL 4 MG/2ML IJ SOLN
INTRAMUSCULAR | Status: DC | PRN
  Administered 2019-03-17: 4 mg via INTRAVENOUS

## 2019-03-17 MED ORDER — ARTIFICIAL TEARS OPHTHALMIC OINT
TOPICAL_OINTMENT | OPHTHALMIC | Status: AC
  Filled 2019-03-17: qty 3.5

## 2019-03-17 MED ORDER — MENTHOL 3 MG MT LOZG: 1.0000 | LOZENGE | OROMUCOSAL | Status: DC | PRN

## 2019-03-17 MED ORDER — PHENYLEPHRINE 40 MCG/ML (10ML) SYRINGE FOR IV PUSH (FOR BLOOD PRESSURE SUPPORT)
PREFILLED_SYRINGE | INTRAVENOUS | Status: AC
  Filled 2019-03-17: qty 10

## 2019-03-17 MED ORDER — HYDROCODONE-ACETAMINOPHEN 5-325 MG PO TABS
2.0000 | ORAL_TABLET | ORAL | Status: DC | PRN
  Administered 2019-03-17 (×2): 2 via ORAL
  Filled 2019-03-17 (×2): qty 2

## 2019-03-17 MED ORDER — VITAMIN D 25 MCG (1000 UNIT) PO TABS
1000.0000 [IU] | ORAL_TABLET | Freq: Every day | ORAL | Status: DC
  Administered 2019-03-17: 1000 [IU] via ORAL
  Filled 2019-03-17: qty 1

## 2019-03-17 MED ORDER — ONDANSETRON HCL 4 MG/2ML IJ SOLN
INTRAMUSCULAR | Status: AC
  Filled 2019-03-17: qty 2

## 2019-03-17 MED ORDER — THROMBIN 20000 UNITS EX SOLR
CUTANEOUS | Status: DC | PRN
  Administered 2019-03-17: 20 mL via TOPICAL

## 2019-03-17 MED ORDER — ACETAMINOPHEN 650 MG RE SUPP: 650.0000 mg | RECTAL | Status: DC | PRN

## 2019-03-17 MED ORDER — FENTANYL CITRATE (PF) 250 MCG/5ML IJ SOLN
INTRAMUSCULAR | Status: DC | PRN
  Administered 2019-03-17: 150 ug via INTRAVENOUS

## 2019-03-17 MED ORDER — PHENOL 1.4 % MT LIQD: 1.0000 | OROMUCOSAL | Status: DC | PRN

## 2019-03-17 MED ORDER — SODIUM CHLORIDE 0.9% FLUSH: 3.0000 mL | INTRAVENOUS | Status: DC | PRN

## 2019-03-17 MED ORDER — CYCLOBENZAPRINE HCL 10 MG PO TABS
ORAL_TABLET | ORAL | Status: AC
  Filled 2019-03-17: qty 1

## 2019-03-17 MED ORDER — CEFAZOLIN SODIUM-DEXTROSE 2-4 GM/100ML-% IV SOLN
2.0000 g | Freq: Three times a day (TID) | INTRAVENOUS | Status: AC
  Administered 2019-03-17 (×2): 2 g via INTRAVENOUS
  Filled 2019-03-17 (×2): qty 100

## 2019-03-17 MED ORDER — PANTOPRAZOLE SODIUM 40 MG PO TBEC
40.0000 mg | DELAYED_RELEASE_TABLET | Freq: Every day | ORAL | Status: DC
  Administered 2019-03-17: 40 mg via ORAL
  Filled 2019-03-17: qty 1

## 2019-03-17 MED ORDER — HYDROCODONE-ACETAMINOPHEN 5-325 MG PO TABS
2.0000 | ORAL_TABLET | ORAL | Status: DC | PRN
  Administered 2019-03-18 (×3): 2 via ORAL
  Filled 2019-03-17 (×3): qty 2

## 2019-03-17 MED ORDER — ONDANSETRON HCL 4 MG/2ML IJ SOLN: 4.0000 mg | Freq: Once | INTRAMUSCULAR | Status: DC | PRN

## 2019-03-17 MED ORDER — ARIPIPRAZOLE 2 MG PO TABS
2.0000 mg | ORAL_TABLET | Freq: Every day | ORAL | Status: DC
  Filled 2019-03-17: qty 1

## 2019-03-17 MED ORDER — PHENYLEPHRINE HCL (PRESSORS) 10 MG/ML IV SOLN
INTRAVENOUS | Status: DC | PRN
  Administered 2019-03-17 (×2): 120 ug via INTRAVENOUS

## 2019-03-17 MED ORDER — PANTOPRAZOLE SODIUM 40 MG IV SOLR: 40.0000 mg | Freq: Every day | INTRAVENOUS | Status: DC

## 2019-03-17 MED ORDER — HYDROMORPHONE HCL 1 MG/ML IJ SOLN: 0.5000 mg | INTRAMUSCULAR | Status: DC | PRN

## 2019-03-17 MED ORDER — SODIUM CHLORIDE 0.9 % IV SOLN
INTRAVENOUS | Status: DC | PRN
  Administered 2019-03-17: 25 ug/min via INTRAVENOUS

## 2019-03-17 MED ORDER — ZOLPIDEM TARTRATE 5 MG PO TABS: 5.0000 mg | ORAL_TABLET | Freq: Every evening | ORAL | Status: DC | PRN

## 2019-03-17 MED ORDER — AMLODIPINE BESYLATE 5 MG PO TABS: 2.5000 mg | ORAL_TABLET | Freq: Every day | ORAL | Status: DC

## 2019-03-17 MED ORDER — LEVOTHYROXINE SODIUM 100 MCG PO TABS
100.0000 ug | ORAL_TABLET | Freq: Every day | ORAL | Status: DC
  Administered 2019-03-18: 100 ug via ORAL
  Filled 2019-03-17: qty 1

## 2019-03-17 MED ORDER — BUPIVACAINE HCL (PF) 0.25 % IJ SOLN
INTRAMUSCULAR | Status: AC
  Filled 2019-03-17: qty 30

## 2019-03-17 MED ORDER — MIDAZOLAM HCL 2 MG/2ML IJ SOLN
INTRAMUSCULAR | Status: DC | PRN
  Administered 2019-03-17: 2 mg via INTRAVENOUS

## 2019-03-17 MED ORDER — MEPERIDINE HCL 25 MG/ML IJ SOLN: 6.2500 mg | INTRAMUSCULAR | Status: DC | PRN

## 2019-03-17 MED ORDER — ACETAMINOPHEN 325 MG PO TABS: 650.0000 mg | ORAL_TABLET | ORAL | Status: DC | PRN

## 2019-03-17 MED ORDER — ONDANSETRON HCL 4 MG/2ML IJ SOLN: 4.0000 mg | Freq: Four times a day (QID) | INTRAMUSCULAR | Status: DC | PRN

## 2019-03-17 MED ORDER — CEFAZOLIN SODIUM-DEXTROSE 2-4 GM/100ML-% IV SOLN
2.0000 g | INTRAVENOUS | Status: AC
  Administered 2019-03-17: 2 g via INTRAVENOUS
  Filled 2019-03-17: qty 100

## 2019-03-17 MED ORDER — LIDOCAINE-EPINEPHRINE 1 %-1:100000 IJ SOLN
INTRAMUSCULAR | Status: DC | PRN
  Administered 2019-03-17: 10 mL

## 2019-03-17 MED ORDER — PROPOFOL 10 MG/ML IV BOLUS
INTRAVENOUS | Status: DC | PRN
  Administered 2019-03-17: 120 mg via INTRAVENOUS

## 2019-03-17 MED ORDER — HYDROMORPHONE HCL 1 MG/ML IJ SOLN
0.2500 mg | INTRAMUSCULAR | Status: DC | PRN
  Administered 2019-03-17: 0.5 mg via INTRAVENOUS
  Administered 2019-03-17 (×2): 0.25 mg via INTRAVENOUS

## 2019-03-17 MED ORDER — HYDROCODONE-ACETAMINOPHEN 5-325 MG PO TABS
1.0000 | ORAL_TABLET | ORAL | Status: DC | PRN
  Administered 2019-03-17: 1 via ORAL

## 2019-03-17 MED ORDER — HYDROMORPHONE HCL 1 MG/ML IJ SOLN
INTRAMUSCULAR | Status: AC
  Filled 2019-03-17: qty 1

## 2019-03-17 MED ORDER — 0.9 % SODIUM CHLORIDE (POUR BTL) OPTIME
TOPICAL | Status: DC | PRN
  Administered 2019-03-17: 1000 mL

## 2019-03-17 MED ORDER — LIDOCAINE-EPINEPHRINE 1 %-1:100000 IJ SOLN
INTRAMUSCULAR | Status: AC
  Filled 2019-03-17: qty 1

## 2019-03-17 MED ORDER — LIDOCAINE 2% (20 MG/ML) 5 ML SYRINGE
INTRAMUSCULAR | Status: AC
  Filled 2019-03-17: qty 5

## 2019-03-17 MED ORDER — DEXAMETHASONE SODIUM PHOSPHATE 10 MG/ML IJ SOLN
INTRAMUSCULAR | Status: AC
  Filled 2019-03-17: qty 1

## 2019-03-17 MED ORDER — DULOXETINE HCL 30 MG PO CPEP: 60.0000 mg | ORAL_CAPSULE | Freq: Every day | ORAL | Status: DC

## 2019-03-17 MED ORDER — CLONAZEPAM 0.5 MG PO TABS: 0.5000 mg | ORAL_TABLET | Freq: Three times a day (TID) | ORAL | Status: DC | PRN

## 2019-03-17 MED ORDER — SODIUM CHLORIDE 0.9 % IV SOLN: 250.0000 mL | INTRAVENOUS | Status: DC

## 2019-03-17 MED ORDER — PROPOFOL 10 MG/ML IV BOLUS
INTRAVENOUS | Status: AC
  Filled 2019-03-17: qty 20

## 2019-03-17 MED ORDER — SODIUM CHLORIDE 0.9% FLUSH
3.0000 mL | Freq: Two times a day (BID) | INTRAVENOUS | Status: DC
  Administered 2019-03-17: 3 mL via INTRAVENOUS

## 2019-03-17 MED ORDER — MIDAZOLAM HCL 2 MG/2ML IJ SOLN
INTRAMUSCULAR | Status: AC
  Filled 2019-03-17: qty 2

## 2019-03-17 MED ORDER — CYCLOBENZAPRINE HCL 10 MG PO TABS
10.0000 mg | ORAL_TABLET | Freq: Three times a day (TID) | ORAL | Status: DC | PRN
  Administered 2019-03-17: 10 mg via ORAL

## 2019-03-17 MED ORDER — THROMBIN 20000 UNITS EX SOLR
CUTANEOUS | Status: AC
  Filled 2019-03-17: qty 20000

## 2019-03-17 MED ORDER — LACTATED RINGERS IV SOLN
INTRAVENOUS | Status: DC | PRN
  Administered 2019-03-17: 07:00:00 via INTRAVENOUS

## 2019-03-17 MED ORDER — ACETAMINOPHEN 10 MG/ML IV SOLN
INTRAVENOUS | Status: AC
  Filled 2019-03-17: qty 100

## 2019-03-17 MED ORDER — SUGAMMADEX SODIUM 200 MG/2ML IV SOLN
INTRAVENOUS | Status: DC | PRN
  Administered 2019-03-17: 108.8 mg via INTRAVENOUS

## 2019-03-17 MED ORDER — PRAVASTATIN SODIUM 40 MG PO TABS: 40.0000 mg | ORAL_TABLET | Freq: Every evening | ORAL | Status: DC

## 2019-03-17 MED ORDER — BUPROPION HCL ER (XL) 300 MG PO TB24: 300.0000 mg | ORAL_TABLET | Freq: Every day | ORAL | Status: DC

## 2019-03-17 MED ORDER — DEXAMETHASONE SODIUM PHOSPHATE 10 MG/ML IJ SOLN
10.0000 mg | INTRAMUSCULAR | Status: DC
  Filled 2019-03-17: qty 1

## 2019-03-17 SURGICAL SUPPLY — 83 items
ADH SKN CLS APL DERMABOND .7 (GAUZE/BANDAGES/DRESSINGS) ×1
APL SKNCLS STERI-STRIP NONHPOA (GAUZE/BANDAGES/DRESSINGS) ×1
BAG DECANTER FOR FLEXI CONT (MISCELLANEOUS) ×2 IMPLANT
BASKET BONE COLLECTION (BASKET) ×1 IMPLANT
BENZOIN TINCTURE PRP APPL 2/3 (GAUZE/BANDAGES/DRESSINGS) ×2 IMPLANT
BIT DRILL 5.0/4.0 (BIT) IMPLANT
BLADE CLIPPER SURG (BLADE) IMPLANT
BLADE SURG 11 STRL SS (BLADE) ×2 IMPLANT
BONE VIVIGEN FORMABLE 5.4CC (Bone Implant) ×2 IMPLANT
BUR CUTTER 7.0 ROUND (BURR) ×2 IMPLANT
BUR MATCHSTICK NEURO 3.0 LAGG (BURR) ×2 IMPLANT
CANISTER SUCT 3000ML PPV (MISCELLANEOUS) ×2 IMPLANT
CAP LOCKING (Cap) ×4 IMPLANT
CAP LOCKING 5.5 CREO (Cap) IMPLANT
CARTRIDGE OIL MAESTRO DRILL (MISCELLANEOUS) ×1 IMPLANT
CLSR STERI-STRIP ANTIMIC 1/2X4 (GAUZE/BANDAGES/DRESSINGS) ×1 IMPLANT
CONT SPEC 4OZ CLIKSEAL STRL BL (MISCELLANEOUS) ×2 IMPLANT
COVER BACK TABLE 60X90IN (DRAPES) ×2 IMPLANT
COVER WAND RF STERILE (DRAPES) ×2 IMPLANT
DECANTER SPIKE VIAL GLASS SM (MISCELLANEOUS) ×2 IMPLANT
DERMABOND ADVANCED (GAUZE/BANDAGES/DRESSINGS) ×1
DERMABOND ADVANCED .7 DNX12 (GAUZE/BANDAGES/DRESSINGS) ×1 IMPLANT
DIFFUSER DRILL AIR PNEUMATIC (MISCELLANEOUS) ×2 IMPLANT
DRAPE C-ARM 42X72 X-RAY (DRAPES) ×4 IMPLANT
DRAPE C-ARMOR (DRAPES) IMPLANT
DRAPE HALF SHEET 40X57 (DRAPES) ×1 IMPLANT
DRAPE LAPAROTOMY 100X72X124 (DRAPES) ×2 IMPLANT
DRAPE SURG 17X23 STRL (DRAPES) ×2 IMPLANT
DRILL 5.0/4.0 (BIT) ×2
DRSG OPSITE 4X5.5 SM (GAUZE/BANDAGES/DRESSINGS) ×1 IMPLANT
DRSG OPSITE POSTOP 4X6 (GAUZE/BANDAGES/DRESSINGS) ×2 IMPLANT
DURAPREP 26ML APPLICATOR (WOUND CARE) ×2 IMPLANT
ELECT REM PT RETURN 9FT ADLT (ELECTROSURGICAL) ×2
ELECTRODE REM PT RTRN 9FT ADLT (ELECTROSURGICAL) ×1 IMPLANT
EVACUATOR 3/16  PVC DRAIN (DRAIN)
EVACUATOR 3/16 PVC DRAIN (DRAIN) IMPLANT
GAUZE 4X4 16PLY RFD (DISPOSABLE) ×1 IMPLANT
GAUZE SPONGE 4X4 12PLY STRL (GAUZE/BANDAGES/DRESSINGS) ×1 IMPLANT
GLOVE BIO SURGEON STRL SZ7 (GLOVE) ×1 IMPLANT
GLOVE BIO SURGEON STRL SZ8 (GLOVE) ×4 IMPLANT
GLOVE BIOGEL PI IND STRL 7.0 (GLOVE) IMPLANT
GLOVE BIOGEL PI IND STRL 7.5 (GLOVE) IMPLANT
GLOVE BIOGEL PI INDICATOR 7.0 (GLOVE) ×3
GLOVE BIOGEL PI INDICATOR 7.5 (GLOVE) ×2
GLOVE ECLIPSE 7.5 STRL STRAW (GLOVE) IMPLANT
GLOVE ECLIPSE 8.0 STRL XLNG CF (GLOVE) ×3 IMPLANT
GLOVE EXAM NITRILE XL STR (GLOVE) IMPLANT
GLOVE INDICATOR 8.5 STRL (GLOVE) ×4 IMPLANT
GOWN STRL REUS W/ TWL LRG LVL3 (GOWN DISPOSABLE) IMPLANT
GOWN STRL REUS W/ TWL XL LVL3 (GOWN DISPOSABLE) ×2 IMPLANT
GOWN STRL REUS W/TWL 2XL LVL3 (GOWN DISPOSABLE) ×1 IMPLANT
GOWN STRL REUS W/TWL LRG LVL3 (GOWN DISPOSABLE) ×2
GOWN STRL REUS W/TWL XL LVL3 (GOWN DISPOSABLE) ×4
GRAFT BNE MATRIX VG FRMBL MD 5 (Bone Implant) IMPLANT
HEMOSTAT POWDER KIT SURGIFOAM (HEMOSTASIS) IMPLANT
KIT BASIN OR (CUSTOM PROCEDURE TRAY) ×2 IMPLANT
KIT INFUSE XX SMALL 0.7CC (Orthopedic Implant) ×1 IMPLANT
KIT TURNOVER KIT B (KITS) ×2 IMPLANT
MILL MEDIUM DISP (BLADE) ×2 IMPLANT
NDL HYPO 21X1.5 SAFETY (NEEDLE) IMPLANT
NDL HYPO 25X1 1.5 SAFETY (NEEDLE) ×1 IMPLANT
NEEDLE HYPO 21X1.5 SAFETY (NEEDLE) ×2 IMPLANT
NEEDLE HYPO 25X1 1.5 SAFETY (NEEDLE) ×2 IMPLANT
NS IRRIG 1000ML POUR BTL (IV SOLUTION) ×2 IMPLANT
OIL CARTRIDGE MAESTRO DRILL (MISCELLANEOUS) ×2
PACK LAMINECTOMY NEURO (CUSTOM PROCEDURE TRAY) ×2 IMPLANT
PAD ARMBOARD 7.5X6 YLW CONV (MISCELLANEOUS) ×6 IMPLANT
ROD SPINAL 35MM (Rod) ×2 IMPLANT
SHAFT CREO 30MM (Neuro Prosthesis/Implant) ×4 IMPLANT
SPACER SUSTAIN TI 9X22X12 15D (Spacer) ×2 IMPLANT
SPONGE LAP 4X18 RFD (DISPOSABLE) IMPLANT
SPONGE SURGIFOAM ABS GEL 100 (HEMOSTASIS) ×2 IMPLANT
STRIP CLOSURE SKIN 1/2X4 (GAUZE/BANDAGES/DRESSINGS) ×4 IMPLANT
SUT VIC AB 0 CT1 18XCR BRD8 (SUTURE) ×2 IMPLANT
SUT VIC AB 0 CT1 8-18 (SUTURE) ×2
SUT VIC AB 2-0 CT1 18 (SUTURE) ×2 IMPLANT
SUT VIC AB 4-0 PS2 27 (SUTURE) ×2 IMPLANT
SYR 20CC LL (SYRINGE) ×1 IMPLANT
TOWEL GREEN STERILE (TOWEL DISPOSABLE) ×2 IMPLANT
TOWEL GREEN STERILE FF (TOWEL DISPOSABLE) ×2 IMPLANT
TRAY FOLEY MTR SLVR 16FR STAT (SET/KITS/TRAYS/PACK) ×2 IMPLANT
TULIP CREP AMP 5.5MM (Orthopedic Implant) ×4 IMPLANT
WATER STERILE IRR 1000ML POUR (IV SOLUTION) ×2 IMPLANT

## 2019-03-17 NOTE — Anesthesia Postprocedure Evaluation (Signed)
Anesthesia Post Note  Patient: Tiffany Gill  Procedure(s) Performed: POSTERIOR LUMBAR INTERBODY FUSION-LUMBAR FOUR-LUMBAR FIVE (N/A Spine Lumbar)     Patient location during evaluation: PACU Anesthesia Type: General Level of consciousness: awake and alert Pain management: pain level controlled Vital Signs Assessment: post-procedure vital signs reviewed and stable Respiratory status: spontaneous breathing, nonlabored ventilation, respiratory function stable and patient connected to nasal cannula oxygen Cardiovascular status: blood pressure returned to baseline and stable Postop Assessment: no apparent nausea or vomiting Anesthetic complications: no    Last Vitals:  Vitals:   03/17/19 1054 03/17/19 1115  BP: 131/79 (!) 144/65  Pulse: 70 72  Resp: 12 16  Temp: (!) 36.1 C (!) 36.4 C  SpO2: 97% 93%    Last Pain:  Vitals:   03/17/19 1115  TempSrc: Oral  PainSc:                  Lyann Hagstrom DAVID

## 2019-03-17 NOTE — Anesthesia Procedure Notes (Signed)
Procedure Name: Intubation Date/Time: 03/17/2019 7:40 AM Performed by: Kathryne Hitch, CRNA Pre-anesthesia Checklist: Patient identified, Emergency Drugs available, Suction available and Patient being monitored Patient Re-evaluated:Patient Re-evaluated prior to induction Oxygen Delivery Method: Circle system utilized Preoxygenation: Pre-oxygenation with 100% oxygen Induction Type: IV induction Ventilation: Mask ventilation without difficulty Laryngoscope Size: Miller and 2 Grade View: Grade I Tube type: Oral Tube size: 7.0 mm Number of attempts: 1 Airway Equipment and Method: Stylet and Oral airway Placement Confirmation: ETT inserted through vocal cords under direct vision,  positive ETCO2 and breath sounds checked- equal and bilateral Secured at: 21 cm Tube secured with: Tape Dental Injury: Teeth and Oropharynx as per pre-operative assessment

## 2019-03-17 NOTE — Op Note (Signed)
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  2.  Posterior lumbar interbody fusion L4-5 utilizing the globus TPS coated insert and rotate peek cages packed with locally harvested autograft mixed with vivigen and BMP.  3.  Cortical screw fixation utilizing the globus Creo modular amp screw set  Surgeon: Dominica Severin Cezar Misiaszek  Assistant: Nash Shearer  Anesthesia: General  EBL: Minimal  HPI: 78 year old female with longstanding back and bilateral leg pain refractory to all forms conservative anti-inflammatories physical therapy and epidural steroid injections.  Work-up revealed a grade 1 spondylolisthesis with severe spinal stenosis at L4-5.  Due to patient progression of clinical syndrome imaging findings and failed conservative treatment I recommended decompressive laminotomies and interbody fusion at that level.  I extensively went over the risks and benefits of the operation with her as well as perioperative course expectations of outcome and alternatives to surgery and she understood and agreed to proceed forward.  Operative procedure: Patient was brought into the OR was induced under general anesthesia positioned prone on the Wilson frame her back was prepped and draped in routine sterile fashion.  The operative x-ray confirmed the appropriate level so after infiltration was made with a cc lidocaine with epi midline incision made over the cartilages take down subcu tissue and subperiosteal dissection was carried lamina of L4-5 confirmed by intraoperative x-ray.  The lamina complex was noted be hypermobile there was synovial cyst, L4-5 facet joint so the dura facet joints were drilled down then they were noted to be markedly diastased.  I  then performed bilateral laminotomies with complete medial facetectomies and foraminotomies of the L4 and L5 nerve roots.  Then interspace was identified epidural veins were coagulated aggressively under biting the superior to collating facet of L5 gained access to the lateral aspect of the space with sequential discectomies and endplate preparation and sequential distraction with an 11 distractor in place I selected a 9 x 12 m millimeter insert and rotate cage this appeared to have good apposition of the endplates opened up the disc space opening up the L4 foramen but also appear to be appropriate sizing.  After both cages been inserted and a dense amount of autograft mixed was packed centrally attention taken a cortical screw placement.  Under fluoroscopic guidance 4 cortical screws were placed all screws excellent purchase the initial left L4 screw displaced laterally I chose a slightly more medial entry point and got good cortical purchase and good bone purchase on the replacement screw.  Fluoroscopy confirmed good position of all the implants the wounds and copiously irrigated because hemostasis was maintained and then assembled the rods tightened down all the knots reexplored the foramen to confirm patency leg Gelfoam in the dura and closed the wound in layers with interrupted Vicryl in a running 4 subcuticular Dermabond benzoin Steri-Strips and a sterile dressing was applied patient recovery in stable condition.  At the end the case all needle count sponge counts were correct.

## 2019-03-17 NOTE — Transfer of Care (Signed)
Immediate Anesthesia Transfer of Care Note  Patient: Tiffany Gill  Procedure(s) Performed: POSTERIOR LUMBAR INTERBODY FUSION-LUMBAR FOUR-LUMBAR FIVE (N/A Spine Lumbar)  Patient Location: PACU  Anesthesia Type:General  Level of Consciousness: drowsy and patient cooperative  Airway & Oxygen Therapy: Patient Spontanous Breathing  Post-op Assessment: Report given to RN and Post -op Vital signs reviewed and stable  Post vital signs: Reviewed and stable  Last Vitals:  Vitals Value Taken Time  BP 109/90 03/17/19 1002  Temp    Pulse 64 03/17/19 1001  Resp 11 03/17/19 1001  SpO2 100 % 03/17/19 1001  Vitals shown include unvalidated device data.  Last Pain:  Vitals:   03/17/19 0630  PainSc: 0-No pain      Patients Stated Pain Goal: 3 (55/21/74 7159)  Complications: No apparent anesthesia complications

## 2019-03-17 NOTE — H&P (Signed)
Tiffany Gill is an 78 y.o. female.   Chief Complaint: Back and left greater than right leg pain HPI: 53-year female is a longstanding back bilateral hip and leg pain radiating down L4 and L5 nerve root pattern.  Work-up revealed grade 1 spondylolisthesis with marked facet arthropathy severe spinal stenosis and by foraminal stenosis the L4 and L5 nerve roots.  Due to patient's failed conservative treatment imaging findings and progression of clinical syndrome I recommended decompressive laminotomies and interbody fusion at L4-5.  I have extensively gone over the risks and benefits of the operation with her as well as perioperative course expectations of outcome and alternatives of surgery and she understands and agrees to proceed forward.  Past Medical History:  Diagnosis Date  . Anxiety   . Depression   . High cholesterol   . Hypertension   . Hypothyroidism   . Osteoarthritis     History reviewed. No pertinent surgical history.  History reviewed. No pertinent family history. Social History:  reports that she has never smoked. She has never used smokeless tobacco. She reports current alcohol use of about 3.0 standard drinks of alcohol per week. She reports that she does not use drugs.  Allergies:  Allergies  Allergen Reactions  . Metronidazole Anaphylaxis and Other (See Comments)    Seizure     Medications Prior to Admission  Medication Sig Dispense Refill  . amLODipine (NORVASC) 2.5 MG tablet Take 2.5 mg by mouth daily.    . ARIPiprazole (ABILIFY) 2 MG tablet Take 2 mg by mouth daily.    Marland Kitchen buPROPion (WELLBUTRIN XL) 300 MG 24 hr tablet Take 300 mg by mouth daily.    . Cholecalciferol (VITAMIN D3) 25 MCG (1000 UT) CAPS Take 1,000 Units by mouth daily.    . clonazePAM (KLONOPIN) 0.5 MG tablet Take 0.5 mg by mouth 3 (three) times daily as needed for anxiety.     . DULoxetine (CYMBALTA) 60 MG capsule Take 60 mg by mouth daily.    Marland Kitchen ibandronate (BONIVA) 150 MG tablet Take 150 mg by  mouth every 30 (thirty) days.    Marland Kitchen L-Methylfolate-Algae (DEPLIN 15) 15-90.314 MG CAPS Take 15 mg by mouth daily.    Marland Kitchen losartan (COZAAR) 50 MG tablet Take 50 mg by mouth daily.    . pravastatin (PRAVACHOL) 40 MG tablet Take 40 mg by mouth every evening.    Marland Kitchen SYNTHROID 100 MCG tablet Take 100 mcg by mouth daily before breakfast.    . zolpidem (AMBIEN) 5 MG tablet Take 5 mg by mouth at bedtime as needed for sleep.      No results found for this or any previous visit (from the past 48 hour(s)). No results found.  Review of Systems  Musculoskeletal: Positive for back pain.  Neurological: Positive for tingling and sensory change.    Blood pressure (!) 172/72, pulse 76, resp. rate 18, height 5' (1.524 m), weight 54.4 kg, SpO2 100 %. Physical Exam  Constitutional: She is oriented to person, place, and time. She appears well-developed.  HENT:  Head: Normocephalic.  Eyes: Pupils are equal, round, and reactive to light.  Neck: Normal range of motion.  Respiratory: Effort normal.  GI: Soft. Bowel sounds are normal.  Neurological: She is alert and oriented to person, place, and time. She has normal strength. GCS eye subscore is 4. GCS verbal subscore is 5. GCS motor subscore is 6.  Strength 5 out of 5 iliopsoas, quads, hamstrings, gastrocs, into tibialis, and EHL.  Skin: Skin is  warm and dry.     Assessment/Plan 78 year old female presents for decompressive laminectomy and interbody fusion L4-5  [name] P, MD 03/17/2019, 7:16 AM

## 2019-03-18 NOTE — Evaluation (Signed)
Physical Therapy Evaluation Patient Details Name: Tiffany Gill MRN: [id number] DOB: 20-Feb-1941 Today's Date: 03/18/2019   History of Present Illness  Pt is a 78 yo female s/p PLIF 4-5 due to BLE leg pain. PMHx: anxiety, depression. OA, HTN  Clinical Impression  Pt admitted with above diagnosis. Pt currently with functional limitations due to the deficits listed below (see PT Problem List). At the time of PT eval pt was able to perform transfers and ambulation with gross min guard assist for balance support and safety with the RW. Pt did require mod assist to elevate trunk to full sitting without railing to hold on to. Pt was educated on brace application/wearing schedule, car transfer, activity progression, and stair negotiation. Pt will benefit from skilled PT to increase their independence and safety with mobility to allow discharge to the venue listed below.       Follow Up Recommendations Home health PT;Supervision for mobility/OOB    Equipment Recommendations  None recommended by PT    Recommendations for Other Services       Precautions / Restrictions Precautions Precautions: Back;Fall Precaution Booklet Issued: Yes (comment) Precaution Comments: Reviewed handout and pt was cued for precautions during functional mobility.  Required Braces or Orthoses: Spinal Brace Spinal Brace: Lumbar corset;Applied in sitting position Restrictions Weight Bearing Restrictions: No      Mobility  Bed Mobility Overal bed mobility: Needs Assistance Bed Mobility: Sidelying to Sit;Sit to Sidelying   Sidelying to sit: Mod assist     Sit to sidelying: Min guard General bed mobility comments: With railing lowered, pt required mod A to elevate trunk to full sitting position.   Transfers Overall transfer level: Needs assistance Equipment used: Rolling walker (2 wheeled) Transfers: Sit to/from [facility] Sit to Stand: Min guard Stand pivot transfers: Min guard        General transfer comment: Close guard provided for safety.   Ambulation/Gait Ambulation/Gait assistance: Min guard Gait Distance (Feet): 200 Feet Assistive device: Rolling walker (2 wheeled) Gait Pattern/deviations: Step-through pattern;Decreased stride length;Trunk flexed;Narrow base of support Gait velocity: Decreased Gait velocity interpretation: <1.8 ft/sec, indicate of risk for recurrent falls General Gait Details: Slow and guarded with RW for support. Several standing rest breaks required 2 fatigue and SOB.   Stairs Stairs: Yes Stairs assistance: Min assist Stair Management: Two rails;Step to pattern;Forwards Number of Stairs: 3 General stair comments: VC's for sequencing and general safety.   Wheelchair Mobility    Modified Rankin (Stroke Patients Only)       Balance Overall balance assessment: Needs assistance Sitting-balance support: Feet supported;Bilateral upper extremity supported Sitting balance-Leahy Scale: Fair     Standing balance support: Bilateral upper extremity supported Standing balance-Leahy Scale: Fair Standing balance comment: using RW, but may not require RW for stability and may benefit from hand heldA                             Pertinent Vitals/Pain Pain Assessment: Faces Faces Pain Scale: Hurts a little bit Pain Location: Pt reports significant pain but does not appear to be in that level of discomfort during session.  Pain Descriptors / Indicators: Operative site guarding Pain Intervention(s): Monitored during session    Home Living Family/patient expects to be discharged to:: Private residence Living Arrangements: Spouse/significant other Available Help at Discharge: Family;Available 24 hours/day Type of Home: House Home Access: Stairs to enter Entrance Stairs-Rails: Can reach both Entrance Stairs-Number of Steps: 3 Home Layout:  Two level;Full bath on main level;Able to live on main level with bedroom/bathroom Home  Equipment: None      Prior Function Level of Independence: Independent         Comments: Spouse provided transportation. Independent with ADL     Hand Dominance   Dominant Hand: Right    Extremity/Trunk Assessment   Upper Extremity Assessment Upper Extremity Assessment: Generalized weakness    Lower Extremity Assessment Lower Extremity Assessment: Generalized weakness    Cervical / Trunk Assessment Cervical / Trunk Assessment: Other exceptions(s/p PLIF sx)  Communication   Communication: No difficulties  Cognition Arousal/Alertness: Awake/alert Behavior During Therapy: WFL for tasks assessed/performed Overall Cognitive Status: No family/caregiver present to determine baseline cognitive functioning                                 General Comments: Pt able to recall precautions with mutlimodal cueing for tasks. Pt with overall poor recall of precautions and general short term memory.       General Comments General comments (skin integrity, edema, etc.): pain in Back     Exercises     Assessment/Plan    PT Assessment Patient needs continued PT services  PT Problem List Decreased strength;Decreased activity tolerance;Decreased balance;Decreased mobility;Decreased knowledge of use of DME;Decreased safety awareness;Decreased knowledge of precautions;Decreased cognition;Pain       PT Treatment Interventions DME instruction;Gait training;Functional mobility training;Stair training;Therapeutic activities;Therapeutic exercise;Neuromuscular re-education;Patient/family education    PT Goals (Current goals can be found in the Care Plan section)  Acute Rehab PT Goals Patient Stated Goal: to go home PT Goal Formulation: With patient Time For Goal Achievement: 03/25/19 Potential to Achieve Goals: Good    Frequency Min 5X/week   Barriers to discharge        Co-evaluation               AM-PAC PT "6 Clicks" Mobility  Outcome Measure Help needed  turning from your back to your side while in a flat bed without using bedrails?: None Help needed moving from lying on your back to sitting on the side of a flat bed without using bedrails?: A Little Help needed moving to and from a bed to a chair (including a wheelchair)?: A Little Help needed standing up from a chair using your arms (e.g., wheelchair or bedside chair)?: A Little Help needed to walk in hospital room?: A Little Help needed climbing 3-5 steps with a railing? : A Little 6 Click Score: 19    End of Session Equipment Utilized During Treatment: Gait belt;Back brace Activity Tolerance: Patient tolerated treatment well Patient left: in bed;with call bell/phone within reach Nurse Communication: Mobility status PT Visit Diagnosis: Unsteadiness on feet (R26.81);Pain;Other symptoms and signs involving the nervous system (R29.898) Pain - part of body: (back)    Time: 6568-1275 PT Time Calculation (min) (ACUTE ONLY): 21 min   Charges:   PT Evaluation $PT Eval Moderate Complexity: 1 Mod          Rolinda Roan, PT, DPT Acute Rehabilitation Services Pager: [phone] Office: [phone]   Thelma Comp 03/18/2019, 9:45 AM

## 2019-03-18 NOTE — TOC Transition Note (Signed)
Transition of Care [facility]) - CM/SW Discharge Note   Patient Details  Name: LAPORSCHA LINEHAN MRN: [id number] Date of Birth: 10/27/1940  [facility] of [facility]) CM/SW Contact:  Zale Marcotte, Benjaman Lobe, RN Phone Number: 03/18/2019, 10:13 AM   Clinical Narrative:     CM consulted for [facility] PT/OT.  Spoke with pt and spouse, provided CMS home health agency list of 3 or higher star ratings.  Pt spouse choose [facility].  CM contacted Georgina Snell with [facility] who accept pt for services.  No further CM needs noted at this time.  Final next level of care: Home w Home Health Services Barriers to Discharge: No Barriers Identified   Patient Goals and CMS Choice   CMS Medicare.gov Compare Post Acute Care list provided to:: Patient Represenative (must comment)(Pt and spouse) Choice offered to / list presented to : Patient, Spouse   Discharge Plan and Services   HH Arranged: PT, OT [facility] Agency: [place] Date [place]: 03/18/19 Time [place]: [phone] Representative spoke with at [place]: Georgina Snell

## 2019-03-18 NOTE — Discharge Instructions (Signed)
Wound Care Keep incision covered and dry for one week.  If you shower prior to then, cover incision with plastic wrap.  You may remove outer bandage after one week and shower.  Do not put any creams, lotions, or ointments on incision. Leave steri-strips on neck.  They will fall off by themselves. Activity Walk each and every day, increasing distance each day. No lifting greater than 5 lbs.  Avoid bending, arching, or twisting. No driving for 2 weeks; may ride as a passenger locally. If provided with back brace, wear when out of bed.  It is not necessary to wear in bed. Diet Resume your normal diet.  Return to Work Will be discussed at you follow up appointment. Call Your Doctor If Any of These Occur Redness, drainage, or swelling at the wound.  Temperature greater than 101 degrees. Severe pain not relieved by pain medication. Incision starts to come apart. Follow Up Appt Call today for appointment in 1-2 weeks ([phone]) or for problems.  If you have any hardware placed in your spine, you will need an x-ray before your appointment.   Spinal Fusion, Adult, Care After This sheet gives you information about how to care for yourself after your procedure. Your doctor may also give you more specific instructions. If you have problems or questions, contact your doctor. Follow these instructions at home: Medicines  Take over-the-counter and prescription medicines only as told by your doctor. These include any medicines for pain or blood-thinning medicines (anticoagulants).  If you were prescribed an antibiotic medicine, take it as told by your doctor. Do not stop taking the antibiotic even if you start to feel better.  Do not drive for 24 hours if you were given a medicine to help you relax (sedative) during your procedure.  Do not drive or use heavy machinery while taking prescription pain medicine. If you have a brace:  Wear the brace as told by your doctor. Take it off only as told by  your doctor.  Keep the brace clean. Managing pain, stiffness, and swelling  If directed, put ice on the surgery area: ? If you have a removable brace, take it off as told by your doctor. ? Put ice in a plastic bag. ? Place a towel between your skin and the bag. ? Leave the ice on for 20 minutes, 2-3 times a day. Surgery cut care   Follow instructions from your doctor about how to take care of your cut from surgery (incision). Make sure you: ? Wash your hands with soap and water before you change your bandage (dressing). If you cannot use soap and water, use hand sanitizer. ? Change your bandage as told by your doctor. ? Leave stitches (sutures), skin glue, or skin tape (adhesive) strips in place. They may need to stay in place for 2 weeks or longer. If tape strips get loose and curl up, you may trim the loose edges. Do not remove tape strips completely unless your doctor says it is okay.  Keep your cut from surgery clean and dry. ? Do not take baths, swim, or use a hot tub until your doctor says it is okay. ? Ask your doctor if you can take showers. You may only be allowed to take sponge baths.  Every day, check your cut from surgery and the area around it for: ? More redness, swelling, or pain. ? Fluid or blood. ? Warmth. ? Pus or a bad smell.  If you have a drain tube, follow instructions  from your doctor about caring for it. Do not take out the drain tube or any bandages unless your doctor says it is okay. Physical activity  Rest and protect your back as much as possible.  Follow instructions from your doctor about how to move. Use good posture to help your spine heal.  Do not lift anything that is heavier than 8 lb (3.6 kg), or the limit that you are told, until your doctor says that it is safe.  Do not twist or bend at the waist until your doctor says it is okay.  It is best if you: ? Do not make pushing and pulling motions. ? Do not sit or lie down in the same position  for a long time. ? Do not raise your hands or arms above your head.  Return to your normal activities as told by your doctor. Ask your doctor what activities are safe for you. Rest and protect your back as much as you can.  Do not start to exercise until your doctor says it is okay. Ask your doctor what kinds of exercise you can do to make your back stronger. General instructions  To prevent blood clots and lessen swelling in your legs: ? Wear compression stockings as told. ? Walk one or more times every few hours as told by your doctor.  Do not use any products that contain nicotine or tobacco, such as cigarettes and e-cigarettes. These can delay bone healing. If you need help quitting, ask your doctor.  To prevent or treat constipation while you are taking prescription pain medicine, your doctor may suggest that you: ? Drink enough fluid to keep your pee (urine) pale yellow. ? Take over-the-counter or prescription medicines. ? Eat foods that are high in fiber. These include fresh fruits and vegetables, whole grains, and beans. ? Limit foods that are high in fat and processed sugars, such as fried and sweet foods.  Keep all follow-up visits as told by your doctor. This is important. Contact a doctor if:  Your pain gets worse.  Your medicine does not help your pain.  Your legs or feet get painful or swollen.  Your cut from surgery is more red, swollen, or painful.  Your cut from surgery feels warm to the touch.  You have: ? Fluid or blood coming from your cut from surgery. ? Pus or a bad smell coming from your cut from surgery. ? A fever. ? Weakness or loss of feeling (numbness) in your legs that is new or getting worse. ? Trouble controlling when you pee (urinate) or poop (have a bowel movement).  You feel sick to your stomach (nauseous).  You throw up (vomit). Get help right away if:  Your pain is very bad.  You have chest pain.  You have trouble breathing.  You  start to have a cough. These symptoms may be an emergency. Do not wait to see if the symptoms will go away. Get medical help right away. Call your local emergency services (911 in the [place] Do not drive yourself to the hospital. Summary  After the procedure, it is common to have pain in your back and pain by your surgery cut(s).  Icing and pain medicines may help to control the pain. Follow directions from your doctor.  Rest and protect your back as much as possible. Do not twist or bend at the waist.  Get up and walk one or more times every few hours as told by your doctor. This information  is not intended to replace advice given to you by your health care provider. Make sure you discuss any questions you have with your health care provider. Document Released: 01/05/2011 Document Revised: 12/26/2016 Document Reviewed: 12/26/2016 Elsevier Interactive Patient Education  2019 [employer].

## 2019-03-18 NOTE — Evaluation (Signed)
Occupational Therapy Evaluation Patient Details Name: Tiffany Gill MRN: [id number] DOB: 1941-04-07 Today's Date: 03/18/2019    History of Present Illness Pt is a 78 yo female s/p PLIF 4-5 due to BLE leg pain. PMHx: anxiety, depression. OA, HTN   Clinical Impression   Pt PTA: independent and living with spouse. Pt currently set-upA for ADL and minguardA for LB ADL; Education provided for LB Dressing and donning socks and pants with AE. Pt donning brace with independence. Pt tolerating session well. Pt performing ADL functional mobility and transfers with RW for stability. Education provided for back precautions as pt is able to perform LB Dressing with AE. Pt unable to recall all precautions, but able to recall with multimodal cues. Handout provided. Pt would benefit from continued OT skilled services for ADL, mobility and safety in [place] setting. OT following acutely.     Follow Up Recommendations  Home health OT;Supervision/Assistance - 24 hour    Equipment Recommendations  None recommended by OT    Recommendations for Other Services       Precautions / Restrictions Precautions Precautions: Back;Fall Precaution Booklet Issued: Yes (comment) Precaution Comments: verbally discussed precautions Required Braces or Orthoses: Spinal Brace Restrictions Weight Bearing Restrictions: No      Mobility Bed Mobility Overal bed mobility: Needs Assistance Bed Mobility: Sidelying to Sit;Sit to Sidelying   Sidelying to sit: Min guard     Sit to sidelying: Min guard General bed mobility comments: minguardA for log roll technique; multimodal cues required  Transfers Overall transfer level: Needs assistance Equipment used: Rolling walker (2 wheeled) Transfers: Sit to/from [facility] Sit to Stand: Min guard Stand pivot transfers: Min guard       General transfer comment: for safety    Balance Overall balance assessment: Needs assistance Sitting-balance support:  Feet supported;Bilateral upper extremity supported Sitting balance-Leahy Scale: Fair     Standing balance support: Bilateral upper extremity supported Standing balance-Leahy Scale: Fair Standing balance comment: using RW, but may not require RW for stability and may benefit from hand heldA                           ADL either performed or assessed with clinical judgement   ADL Overall ADL's : Needs assistance/impaired Eating/Feeding: Modified independent;Sitting   Grooming: Supervision/safety;Standing   Upper Body Bathing: Set up;Standing   Lower Body Bathing: Min guard;Sitting/lateral leans;Sit to/from stand   Upper Body Dressing : Set up;Standing   Lower Body Dressing: Min guard;Sitting/lateral leans;Sit to/from stand   Toilet Transfer: Contractor- Water quality scientist and Hygiene: Min guard;Sitting/lateral lean;Sit to/from stand;With adaptive equipment;Cueing for safety;Cueing for sequencing;Cueing for back precautions       Functional mobility during ADLs: Min guard;Rolling walker;Cueing for safety;Cueing for sequencing General ADL Comments: Pt set-upA for ADL and minguardA for LB ADL; Education provided for LB Dressing and donning socks and pants with AE. Pt donning brace with independence.     Vision Baseline Vision/History: No visual deficits Patient Visual Report: No change from baseline Vision Assessment?: No apparent visual deficits     Perception     Praxis      Pertinent Vitals/Pain       Hand Dominance Right   Extremity/Trunk Assessment Upper Extremity Assessment Upper Extremity Assessment: Generalized weakness   Lower Extremity Assessment Lower Extremity Assessment: Generalized weakness   Cervical / Trunk Assessment Cervical / Trunk Assessment: Other exceptions(s/p PLIF sx)   Communication Communication Communication:  No difficulties   Cognition Arousal/Alertness: Awake/alert Behavior During Therapy:  WFL for tasks assessed/performed Overall Cognitive Status: No family/caregiver present to determine baseline cognitive functioning                                 General Comments: Pt able to recall precautions with mutlimodal cueing for tasks. Pt could not recall whether she used a RW a few mins after she had been up to bathroom with OTR.   General Comments  pain in Back     Exercises     Shoulder Instructions      Home Living Family/patient expects to be discharged to:: Private residence Living Arrangements: Spouse/significant other Available Help at Discharge: Family;Available 24 hours/day Type of Home: House Home Access: Stairs to enter [employer] of Steps: 3 Entrance Stairs-Rails: Can reach both Home Layout: Two level;Full bath on main level;Able to live on main level with bedroom/bathroom Alternate Level Stairs-Number of Steps: full flight Alternate Level Stairs-Rails: Can reach both Bathroom Shower/Tub: Walk-in shower;Door   [employer] Toilet: Standard     Home Equipment: None          Prior Functioning/Environment Level of Independence: Independent        Comments: Spouse provided transportation. Independent with ADL        OT Problem List: Decreased strength;Impaired balance (sitting and/or standing);Decreased activity tolerance;Decreased safety awareness;Pain      OT Treatment/Interventions: Self-care/ADL training;Therapeutic exercise;Neuromuscular education;Energy conservation;DME and/or AE instruction;Therapeutic activities;Patient/family education;Balance training    OT Goals(Current goals can be found in the care plan section) Acute Rehab OT Goals Patient Stated Goal: to go home OT Goal Formulation: With patient Time For Goal Achievement: 04/01/19 Potential to Achieve Goals: Good ADL Goals Pt Will Perform Lower Body Dressing: with modified independence;with adaptive equipment;sitting/lateral leans;sit to/from stand  OT  Frequency: Min 2X/week   Barriers to D/C:            Co-evaluation              AM-PAC OT "6 Clicks" Daily Activity     Outcome Measure Help from another person eating meals?: None Help from another person taking care of personal grooming?: None Help from another person toileting, which includes using toliet, bedpan, or urinal?: None Help from another person bathing (including washing, rinsing, drying)?: A Little Help from another person to put on and taking off regular upper body clothing?: None Help from another person to put on and taking off regular lower body clothing?: A Little 6 Click Score: 22   End of Session Equipment Utilized During Treatment: Gait belt;Rolling walker;Back brace Nurse Communication: Mobility status;Precautions  Activity Tolerance: Patient tolerated treatment well Patient left: in bed;with call bell/phone within reach  OT Visit Diagnosis: Unsteadiness on feet (R26.81);Muscle weakness (generalized) (M62.81);Pain Pain - part of body: (back)                Time: 4403-4742 OT Time Calculation (min): 40 min Charges:  OT General Charges $OT Visit: 1 Visit OT Evaluation $OT Eval Moderate Complexity: 1 Mod OT Treatments $Self Care/Home Management : 8-22 mins $Neuromuscular Re-education: 8-22 mins  Darryl Nestle) Marsa Aris OTR/L Acute Rehabilitation Services Pager: [phone] Office: [place] 03/18/2019, 8:55 AM

## 2019-03-18 NOTE — Discharge Summary (Signed)
  Physician Discharge Summary  Patient ID: Tiffany Gill MRN: [id number] DOB/AGE: [birth year] 77 y.o. Estimated body mass index is 23.44 kg/m as calculated from the following:   Height as of this encounter: 5' (1.524 m).   Weight as of this encounter: 54.4 kg.   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  Postoperatively patient did very well with recovery in the floor on the floor she was ambulating and voiding spontaneously tolerating regular diet and stable for discharge home.  Consults: Significant Diagnostic Studies: Treatments: L4-5 interbody fusion Discharge Exam: Blood pressure 140/80, pulse 84, temperature 97.7 F (36.5 C), temperature source Oral, resp. rate 18, height 5' (1.524 m), weight 54.4 kg, SpO2 97 %. Strength 5 out of 5 wound clean dry and intact  Disposition: Home   Allergies as of 03/18/2019      Reactions   Metronidazole Anaphylaxis, Other (See Comments)   Seizure      Medication List    TAKE these medications   amLODipine 2.5 MG tablet Commonly known as: NORVASC Take 2.5 mg by mouth daily.   ARIPiprazole 2 MG tablet Commonly known as: ABILIFY Take 2 mg by mouth daily.   buPROPion 300 MG 24 hr tablet Commonly known as: WELLBUTRIN XL Take 300 mg by mouth daily.   clonazePAM 0.5 MG tablet Commonly known as: KLONOPIN Take 0.5 mg by mouth 3 (three) times daily as needed for anxiety.   Deplin 15 15-90.314 MG Caps Take 15 mg by mouth daily.   DULoxetine 60 MG capsule Commonly known as: CYMBALTA Take 60 mg by mouth daily.   ibandronate 150 MG tablet Commonly known as: BONIVA Take 150 mg by mouth every 30 (thirty) days.   losartan 50 MG tablet Commonly known as:  COZAAR Take 50 mg by mouth daily.   pravastatin 40 MG tablet Commonly known as: PRAVACHOL Take 40 mg by mouth every evening.   Synthroid 100 MCG tablet Generic drug: levothyroxine Take 100 mcg by mouth daily before breakfast.   Vitamin D3 25 MCG (1000 UT) Caps Take 1,000 Units by mouth daily.   zolpidem 5 MG tablet Commonly known as: AMBIEN Take 5 mg by mouth at bedtime as needed for sleep.        Signed: Mohmed Farver P 03/18/2019, 7:52 AM

## 2019-03-18 NOTE — Plan of Care (Signed)
Pt and husband given D/C instructions with verbal understanding. Rx's were sent to Pt's pharmacy by MD. Pt's incision is clean and dry with no sign of infection. Pt's IV was removed prior to D/C. Home Health was arranged by CM per MD order. Pt D/C'd home via wheelchair @ 1030 per MD order. Pt is stable @ D/C and has no other needs at this time. Holli Humbles, RN

## 2019-03-24 ENCOUNTER — Inpatient Hospital Stay (HOSPITAL_COMMUNITY)
Admission: AD | Admit: 2019-03-24 | Discharge: 2019-04-14 | DRG: 459 | Disposition: A | Discharge status: Rehabilitation Facility | Payer: Medicare Other | Source: Ambulatory Visit | Attending: Neurosurgery | Admitting: Neurosurgery

## 2019-03-24 ENCOUNTER — Encounter (HOSPITAL_COMMUNITY): Payer: Self-pay

## 2019-03-24 ENCOUNTER — Other Ambulatory Visit: Payer: Self-pay

## 2019-03-24 ENCOUNTER — Inpatient Hospital Stay (HOSPITAL_COMMUNITY): Payer: Medicare Other

## 2019-03-24 DIAGNOSIS — D62 Acute posthemorrhagic anemia: Secondary | ICD-10-CM | POA: Diagnosis present

## 2019-03-24 DIAGNOSIS — S0101XA Laceration without foreign body of scalp, initial encounter: Secondary | ICD-10-CM | POA: Diagnosis not present

## 2019-03-24 DIAGNOSIS — M438X2 Other specified deforming dorsopathies, cervical region: Secondary | ICD-10-CM | POA: Diagnosis not present

## 2019-03-24 DIAGNOSIS — M549 Dorsalgia, unspecified: Secondary | ICD-10-CM

## 2019-03-24 DIAGNOSIS — N179 Acute kidney failure, unspecified: Secondary | ICD-10-CM | POA: Diagnosis present

## 2019-03-24 DIAGNOSIS — Z1159 Encounter for screening for other viral diseases: Secondary | ICD-10-CM

## 2019-03-24 DIAGNOSIS — G9341 Metabolic encephalopathy: Secondary | ICD-10-CM | POA: Diagnosis not present

## 2019-03-24 DIAGNOSIS — F19921 Other psychoactive substance use, unspecified with intoxication with delirium: Secondary | ICD-10-CM | POA: Diagnosis not present

## 2019-03-24 DIAGNOSIS — T8131XS Disruption of external operation (surgical) wound, not elsewhere classified, sequela: Secondary | ICD-10-CM | POA: Diagnosis not present

## 2019-03-24 DIAGNOSIS — R8290 Unspecified abnormal findings in urine: Secondary | ICD-10-CM | POA: Diagnosis not present

## 2019-03-24 DIAGNOSIS — M50122 Cervical disc disorder at C5-C6 level with radiculopathy: Secondary | ICD-10-CM | POA: Diagnosis not present

## 2019-03-24 DIAGNOSIS — Z419 Encounter for procedure for purposes other than remedying health state, unspecified: Secondary | ICD-10-CM

## 2019-03-24 DIAGNOSIS — D72829 Elevated white blood cell count, unspecified: Secondary | ICD-10-CM | POA: Diagnosis present

## 2019-03-24 DIAGNOSIS — S32049A Unspecified fracture of fourth lumbar vertebra, initial encounter for closed fracture: Secondary | ICD-10-CM | POA: Diagnosis not present

## 2019-03-24 DIAGNOSIS — D164 Benign neoplasm of bones of skull and face: Secondary | ICD-10-CM | POA: Diagnosis not present

## 2019-03-24 DIAGNOSIS — G9519 Other vascular myelopathies: Secondary | ICD-10-CM | POA: Diagnosis present

## 2019-03-24 DIAGNOSIS — I672 Cerebral atherosclerosis: Secondary | ICD-10-CM | POA: Diagnosis not present

## 2019-03-24 DIAGNOSIS — F329 Major depressive disorder, single episode, unspecified: Secondary | ICD-10-CM | POA: Diagnosis present

## 2019-03-24 DIAGNOSIS — S32058A Other fracture of fifth lumbar vertebra, initial encounter for closed fracture: Secondary | ICD-10-CM | POA: Diagnosis not present

## 2019-03-24 DIAGNOSIS — Z4789 Encounter for other orthopedic aftercare: Secondary | ICD-10-CM | POA: Diagnosis not present

## 2019-03-24 DIAGNOSIS — M545 Low back pain: Secondary | ICD-10-CM | POA: Diagnosis not present

## 2019-03-24 DIAGNOSIS — N183 Chronic kidney disease, stage 3 unspecified: Secondary | ICD-10-CM

## 2019-03-24 DIAGNOSIS — Y92230 Patient room in hospital as the place of occurrence of the external cause: Secondary | ICD-10-CM | POA: Diagnosis not present

## 2019-03-24 DIAGNOSIS — M5442 Lumbago with sciatica, left side: Secondary | ICD-10-CM | POA: Diagnosis not present

## 2019-03-24 DIAGNOSIS — M21372 Foot drop, left foot: Secondary | ICD-10-CM | POA: Diagnosis present

## 2019-03-24 DIAGNOSIS — M4310 Spondylolisthesis, site unspecified: Secondary | ICD-10-CM

## 2019-03-24 DIAGNOSIS — R52 Pain, unspecified: Secondary | ICD-10-CM | POA: Diagnosis not present

## 2019-03-24 DIAGNOSIS — Z981 Arthrodesis status: Secondary | ICD-10-CM | POA: Diagnosis not present

## 2019-03-24 DIAGNOSIS — W19XXXA Unspecified fall, initial encounter: Secondary | ICD-10-CM | POA: Diagnosis not present

## 2019-03-24 DIAGNOSIS — S32059A Unspecified fracture of fifth lumbar vertebra, initial encounter for closed fracture: Secondary | ICD-10-CM | POA: Diagnosis present

## 2019-03-24 DIAGNOSIS — Z79890 Hormone replacement therapy (postmenopausal): Secondary | ICD-10-CM

## 2019-03-24 DIAGNOSIS — R5381 Other malaise: Secondary | ICD-10-CM | POA: Diagnosis present

## 2019-03-24 DIAGNOSIS — M5416 Radiculopathy, lumbar region: Secondary | ICD-10-CM | POA: Diagnosis present

## 2019-03-24 DIAGNOSIS — T84216A Breakdown (mechanical) of internal fixation device of vertebrae, initial encounter: Secondary | ICD-10-CM | POA: Diagnosis not present

## 2019-03-24 DIAGNOSIS — R410 Disorientation, unspecified: Secondary | ICD-10-CM | POA: Diagnosis not present

## 2019-03-24 DIAGNOSIS — G92 Toxic encephalopathy: Secondary | ICD-10-CM | POA: Diagnosis not present

## 2019-03-24 DIAGNOSIS — G479 Sleep disorder, unspecified: Secondary | ICD-10-CM | POA: Diagnosis present

## 2019-03-24 DIAGNOSIS — F419 Anxiety disorder, unspecified: Secondary | ICD-10-CM | POA: Diagnosis not present

## 2019-03-24 DIAGNOSIS — G8918 Other acute postprocedural pain: Secondary | ICD-10-CM | POA: Diagnosis present

## 2019-03-24 DIAGNOSIS — M4316 Spondylolisthesis, lumbar region: Secondary | ICD-10-CM | POA: Diagnosis not present

## 2019-03-24 DIAGNOSIS — M5126 Other intervertebral disc displacement, lumbar region: Secondary | ICD-10-CM | POA: Diagnosis not present

## 2019-03-24 DIAGNOSIS — F418 Other specified anxiety disorders: Secondary | ICD-10-CM | POA: Diagnosis not present

## 2019-03-24 DIAGNOSIS — G894 Chronic pain syndrome: Secondary | ICD-10-CM

## 2019-03-24 DIAGNOSIS — T84296A Other mechanical complication of internal fixation device of vertebrae, initial encounter: Principal | ICD-10-CM | POA: Diagnosis present

## 2019-03-24 DIAGNOSIS — E860 Dehydration: Secondary | ICD-10-CM | POA: Diagnosis not present

## 2019-03-24 DIAGNOSIS — E861 Hypovolemia: Secondary | ICD-10-CM | POA: Diagnosis not present

## 2019-03-24 DIAGNOSIS — I700 Atherosclerosis of aorta: Secondary | ICD-10-CM | POA: Diagnosis not present

## 2019-03-24 DIAGNOSIS — F324 Major depressive disorder, single episode, in partial remission: Secondary | ICD-10-CM | POA: Diagnosis present

## 2019-03-24 DIAGNOSIS — M25551 Pain in right hip: Secondary | ICD-10-CM | POA: Diagnosis present

## 2019-03-24 DIAGNOSIS — F05 Delirium due to known physiological condition: Secondary | ICD-10-CM | POA: Diagnosis not present

## 2019-03-24 DIAGNOSIS — Z881 Allergy status to other antibiotic agents status: Secondary | ICD-10-CM

## 2019-03-24 DIAGNOSIS — I129 Hypertensive chronic kidney disease with stage 1 through stage 4 chronic kidney disease, or unspecified chronic kidney disease: Secondary | ICD-10-CM | POA: Diagnosis present

## 2019-03-24 DIAGNOSIS — E871 Hypo-osmolality and hyponatremia: Secondary | ICD-10-CM | POA: Diagnosis not present

## 2019-03-24 DIAGNOSIS — N390 Urinary tract infection, site not specified: Secondary | ICD-10-CM | POA: Diagnosis not present

## 2019-03-24 DIAGNOSIS — S0102XA Laceration with foreign body of scalp, initial encounter: Secondary | ICD-10-CM | POA: Diagnosis not present

## 2019-03-24 DIAGNOSIS — M8588 Other specified disorders of bone density and structure, other site: Secondary | ICD-10-CM | POA: Diagnosis not present

## 2019-03-24 DIAGNOSIS — Z823 Family history of stroke: Secondary | ICD-10-CM | POA: Diagnosis not present

## 2019-03-24 DIAGNOSIS — E785 Hyperlipidemia, unspecified: Secondary | ICD-10-CM | POA: Diagnosis present

## 2019-03-24 DIAGNOSIS — Y838 Other surgical procedures as the cause of abnormal reaction of the patient, or of later complication, without mention of misadventure at the time of the procedure: Secondary | ICD-10-CM | POA: Diagnosis present

## 2019-03-24 DIAGNOSIS — E039 Hypothyroidism, unspecified: Secondary | ICD-10-CM | POA: Diagnosis present

## 2019-03-24 DIAGNOSIS — Z8249 Family history of ischemic heart disease and other diseases of the circulatory system: Secondary | ICD-10-CM

## 2019-03-24 DIAGNOSIS — S0003XA Contusion of scalp, initial encounter: Secondary | ICD-10-CM | POA: Diagnosis not present

## 2019-03-24 DIAGNOSIS — M438X4 Other specified deforming dorsopathies, thoracic region: Secondary | ICD-10-CM | POA: Diagnosis not present

## 2019-03-24 DIAGNOSIS — M960 Pseudarthrosis after fusion or arthrodesis: Secondary | ICD-10-CM | POA: Diagnosis not present

## 2019-03-24 DIAGNOSIS — I10 Essential (primary) hypertension: Secondary | ICD-10-CM | POA: Diagnosis not present

## 2019-03-24 DIAGNOSIS — S3992XA Unspecified injury of lower back, initial encounter: Secondary | ICD-10-CM | POA: Diagnosis not present

## 2019-03-24 DIAGNOSIS — M532X6 Spinal instabilities, lumbar region: Secondary | ICD-10-CM | POA: Diagnosis not present

## 2019-03-24 DIAGNOSIS — M79604 Pain in right leg: Secondary | ICD-10-CM | POA: Diagnosis not present

## 2019-03-24 DIAGNOSIS — R0989 Other specified symptoms and signs involving the circulatory and respiratory systems: Secondary | ICD-10-CM | POA: Diagnosis not present

## 2019-03-24 DIAGNOSIS — Z79899 Other long term (current) drug therapy: Secondary | ICD-10-CM

## 2019-03-24 DIAGNOSIS — T380X5A Adverse effect of glucocorticoids and synthetic analogues, initial encounter: Secondary | ICD-10-CM | POA: Diagnosis not present

## 2019-03-24 DIAGNOSIS — M5441 Lumbago with sciatica, right side: Secondary | ICD-10-CM | POA: Diagnosis not present

## 2019-03-24 DIAGNOSIS — G8929 Other chronic pain: Secondary | ICD-10-CM | POA: Diagnosis not present

## 2019-03-24 DIAGNOSIS — T8131XA Disruption of external operation (surgical) wound, not elsewhere classified, initial encounter: Secondary | ICD-10-CM | POA: Diagnosis not present

## 2019-03-24 DIAGNOSIS — Y831 Surgical operation with implant of artificial internal device as the cause of abnormal reaction of the patient, or of later complication, without mention of misadventure at the time of the procedure: Secondary | ICD-10-CM | POA: Diagnosis not present

## 2019-03-24 DIAGNOSIS — F331 Major depressive disorder, recurrent, moderate: Secondary | ICD-10-CM | POA: Diagnosis not present

## 2019-03-24 DIAGNOSIS — F411 Generalized anxiety disorder: Secondary | ICD-10-CM | POA: Diagnosis not present

## 2019-03-24 DIAGNOSIS — S0191XA Laceration without foreign body of unspecified part of head, initial encounter: Secondary | ICD-10-CM | POA: Diagnosis not present

## 2019-03-24 DIAGNOSIS — M25552 Pain in left hip: Secondary | ICD-10-CM | POA: Diagnosis not present

## 2019-03-24 DIAGNOSIS — M452 Ankylosing spondylitis of cervical region: Secondary | ICD-10-CM | POA: Diagnosis not present

## 2019-03-24 HISTORY — DX: Major depressive disorder, single episode, in partial remission: F32.4

## 2019-03-24 HISTORY — DX: Other amnesia: R41.3

## 2019-03-24 HISTORY — DX: Unspecified abnormalities of gait and mobility: R26.9

## 2019-03-24 LAB — SARS CORONAVIRUS 2 BY RT PCR (HOSPITAL ORDER, PERFORMED IN ~~LOC~~ HOSPITAL LAB): SARS Coronavirus 2: NEGATIVE

## 2019-03-24 MED ORDER — SODIUM CHLORIDE 0.9% FLUSH: 3.0000 mL | INTRAVENOUS | Status: DC | PRN

## 2019-03-24 MED ORDER — VITAMIN D 25 MCG (1000 UNIT) PO TABS
1000.0000 [IU] | ORAL_TABLET | Freq: Every day | ORAL | Status: DC
  Administered 2019-03-25 – 2019-04-14 (×20): 1000 [IU] via ORAL
  Filled 2019-03-24 (×20): qty 1

## 2019-03-24 MED ORDER — ZOLPIDEM TARTRATE 5 MG PO TABS: 5.0000 mg | ORAL_TABLET | Freq: Every evening | ORAL | Status: DC | PRN

## 2019-03-24 MED ORDER — ONDANSETRON HCL 4 MG/2ML IJ SOLN: 4.0000 mg | Freq: Four times a day (QID) | INTRAMUSCULAR | Status: DC | PRN

## 2019-03-24 MED ORDER — SODIUM CHLORIDE 0.9 % IV SOLN: 250.0000 mL | INTRAVENOUS | Status: DC

## 2019-03-24 MED ORDER — SODIUM CHLORIDE 0.9% FLUSH
10.0000 mL | Freq: Two times a day (BID) | INTRAVENOUS | Status: DC
  Administered 2019-03-24 – 2019-04-14 (×36): 10 mL

## 2019-03-24 MED ORDER — PRAVASTATIN SODIUM 40 MG PO TABS
40.0000 mg | ORAL_TABLET | Freq: Every evening | ORAL | Status: DC
  Administered 2019-03-24 – 2019-04-13 (×20): 40 mg via ORAL
  Filled 2019-03-24 (×21): qty 1

## 2019-03-24 MED ORDER — DEPLIN 15 15-90.314 MG PO CAPS: 15.0000 mg | ORAL_CAPSULE | Freq: Every day | ORAL | Status: DC

## 2019-03-24 MED ORDER — PHENOL 1.4 % MT LIQD: 1.0000 | OROMUCOSAL | Status: DC | PRN

## 2019-03-24 MED ORDER — MENTHOL 3 MG MT LOZG: 1.0000 | LOZENGE | OROMUCOSAL | Status: DC | PRN

## 2019-03-24 MED ORDER — CLONAZEPAM 0.5 MG PO TABS
0.5000 mg | ORAL_TABLET | Freq: Three times a day (TID) | ORAL | Status: DC | PRN
  Administered 2019-03-26 – 2019-04-13 (×14): 0.5 mg via ORAL
  Filled 2019-03-24 (×16): qty 1

## 2019-03-24 MED ORDER — LEVOTHYROXINE SODIUM 100 MCG PO TABS
100.0000 ug | ORAL_TABLET | Freq: Every day | ORAL | Status: DC
  Administered 2019-03-25 – 2019-04-14 (×20): 100 ug via ORAL
  Filled 2019-03-24 (×20): qty 1

## 2019-03-24 MED ORDER — SODIUM CHLORIDE 0.9% FLUSH
3.0000 mL | Freq: Two times a day (BID) | INTRAVENOUS | Status: DC
  Administered 2019-03-24 – 2019-04-07 (×14): 3 mL via INTRAVENOUS

## 2019-03-24 MED ORDER — HYDROMORPHONE HCL 1 MG/ML IJ SOLN
0.5000 mg | INTRAMUSCULAR | Status: DC | PRN
  Administered 2019-03-24 – 2019-04-02 (×13): 0.5 mg via INTRAVENOUS
  Filled 2019-03-24 (×13): qty 0.5

## 2019-03-24 MED ORDER — ARIPIPRAZOLE 2 MG PO TABS
2.0000 mg | ORAL_TABLET | Freq: Every day | ORAL | Status: DC
  Administered 2019-03-25 – 2019-04-14 (×21): 2 mg via ORAL
  Filled 2019-03-24 (×22): qty 1

## 2019-03-24 MED ORDER — BUPROPION HCL ER (XL) 150 MG PO TB24
300.0000 mg | ORAL_TABLET | Freq: Every day | ORAL | Status: DC
  Administered 2019-03-25 – 2019-04-14 (×21): 300 mg via ORAL
  Filled 2019-03-24 (×21): qty 2

## 2019-03-24 MED ORDER — ACETAMINOPHEN 325 MG PO TABS
650.0000 mg | ORAL_TABLET | ORAL | Status: DC | PRN
  Administered 2019-03-28 – 2019-04-03 (×4): 650 mg via ORAL
  Filled 2019-03-24 (×4): qty 2

## 2019-03-24 MED ORDER — SODIUM CHLORIDE 0.9% FLUSH
10.0000 mL | INTRAVENOUS | Status: DC | PRN
  Administered 2019-04-02: 10 mL
  Filled 2019-03-24: qty 40

## 2019-03-24 MED ORDER — ONDANSETRON HCL 4 MG PO TABS: 4.0000 mg | ORAL_TABLET | Freq: Four times a day (QID) | ORAL | Status: DC | PRN

## 2019-03-24 MED ORDER — ENSURE ENLIVE PO LIQD
237.0000 mL | Freq: Two times a day (BID) | ORAL | Status: DC
  Administered 2019-03-24 – 2019-04-14 (×32): 237 mL via ORAL

## 2019-03-24 MED ORDER — DULOXETINE HCL 60 MG PO CPEP
60.0000 mg | ORAL_CAPSULE | Freq: Every day | ORAL | Status: DC
  Administered 2019-03-25 – 2019-04-14 (×19): 60 mg via ORAL
  Filled 2019-03-24 (×21): qty 1

## 2019-03-24 MED ORDER — LOSARTAN POTASSIUM 50 MG PO TABS
50.0000 mg | ORAL_TABLET | Freq: Every day | ORAL | Status: DC
  Administered 2019-03-25 – 2019-04-14 (×20): 50 mg via ORAL
  Filled 2019-03-24 (×20): qty 1

## 2019-03-24 MED ORDER — ALUM & MAG HYDROXIDE-SIMETH 200-200-20 MG/5ML PO SUSP: 30.0000 mL | Freq: Four times a day (QID) | ORAL | Status: DC | PRN

## 2019-03-24 MED ORDER — ACETAMINOPHEN 650 MG RE SUPP: 650.0000 mg | RECTAL | Status: DC | PRN

## 2019-03-24 MED ORDER — IBANDRONATE SODIUM 150 MG PO TABS: 150.0000 mg | ORAL_TABLET | ORAL | Status: DC

## 2019-03-24 MED ORDER — AMLODIPINE BESYLATE 2.5 MG PO TABS
2.5000 mg | ORAL_TABLET | Freq: Every day | ORAL | Status: DC
  Administered 2019-03-25 – 2019-04-03 (×10): 2.5 mg via ORAL
  Filled 2019-03-24 (×10): qty 1

## 2019-03-24 MED ORDER — OXYCODONE HCL 5 MG PO TABS
10.0000 mg | ORAL_TABLET | ORAL | Status: DC | PRN
  Administered 2019-03-24 – 2019-03-28 (×15): 10 mg via ORAL
  Filled 2019-03-24 (×16): qty 2

## 2019-03-24 MED ORDER — PANTOPRAZOLE SODIUM 40 MG IV SOLR
40.0000 mg | Freq: Every day | INTRAVENOUS | Status: DC
  Administered 2019-03-24 – 2019-03-25 (×2): 40 mg via INTRAVENOUS
  Filled 2019-03-24 (×2): qty 40

## 2019-03-24 MED ORDER — CYCLOBENZAPRINE HCL 10 MG PO TABS
10.0000 mg | ORAL_TABLET | Freq: Three times a day (TID) | ORAL | Status: DC | PRN
  Administered 2019-03-24 – 2019-03-28 (×3): 10 mg via ORAL
  Filled 2019-03-24 (×3): qty 1

## 2019-03-24 NOTE — H&P (Signed)
Tiffany Gill is an 78 y.o. female.   Chief Complaint: Back pain HPI:  78 year old female 1 week out from a lumbar interbody fusion who has had progressive worsening low back pain and bilateral hip pain going on through the latter part of the we can over the weekend.  This been refractory to anti-inflammatories steroids and pain medication.  She came to the office today underwent x-rays that show possible subsidence of her cage in displacement.  So I have admitted her to the hospital for pain management CT scan of lumbar spine to see whether we would need to revise her interbody fusion  Past Medical History:  Diagnosis Date  . Anxiety   . Depression   . High cholesterol   . Hypertension   . Hypothyroidism   . Osteoarthritis     No past surgical history on file.  No family history on file. Social History:  reports that she has never smoked. She has never used smokeless tobacco. She reports current alcohol use of about 3.0 standard drinks of alcohol per week. She reports that she does not use drugs.  Allergies:  Allergies  Allergen Reactions  . Metronidazole Anaphylaxis and Other (See Comments)    Seizure     Medications Prior to Admission  Medication Sig Dispense Refill  . amLODipine (NORVASC) 2.5 MG tablet Take 2.5 mg by mouth daily.    . ARIPiprazole (ABILIFY) 2 MG tablet Take 2 mg by mouth daily.    Marland Kitchen buPROPion (WELLBUTRIN XL) 300 MG 24 hr tablet Take 300 mg by mouth daily.    . Cholecalciferol (VITAMIN D3) 25 MCG (1000 UT) CAPS Take 1,000 Units by mouth daily.    . clonazePAM (KLONOPIN) 0.5 MG tablet Take 0.5 mg by mouth 3 (three) times daily as needed for anxiety.     . DULoxetine (CYMBALTA) 60 MG capsule Take 60 mg by mouth daily.    Marland Kitchen ibandronate (BONIVA) 150 MG tablet Take 150 mg by mouth every 30 (thirty) days.    Marland Kitchen L-Methylfolate-Algae (DEPLIN 15) 15-90.314 MG CAPS Take 15 mg by mouth daily.    Marland Kitchen losartan (COZAAR) 50 MG tablet Take 50 mg by mouth daily.    .  pravastatin (PRAVACHOL) 40 MG tablet Take 40 mg by mouth every evening.    Marland Kitchen SYNTHROID 100 MCG tablet Take 100 mcg by mouth daily before breakfast.    . zolpidem (AMBIEN) 5 MG tablet Take 5 mg by mouth at bedtime as needed for sleep.      No results found for this or any previous visit (from the past 48 hour(s)). No results found.  Review of Systems  Musculoskeletal: Positive for back pain.    Blood pressure (P) 131/82, pulse (P) 84, temperature (P) 98.6 F (37 C), temperature source (P) Oral, resp. rate (P) 16, SpO2 (P) 100 %. Physical Exam  Constitutional: She is oriented to person, place, and time. She appears well-developed and well-nourished.  HENT:  Head: Normocephalic.  Eyes: Pupils are equal, round, and reactive to light.  Neck: Normal range of motion.  Respiratory: Effort normal.  GI: Soft.  Musculoskeletal: Normal range of motion.  Neurological: She is alert and oriented to person, place, and time. She has normal strength. GCS eye subscore is 4. GCS verbal subscore is 5. GCS motor subscore is 6.    Strength is 5/5 iliopsoas, quads, hamstrings, gastrocs, anterior tibialis, and EHL.     Assessment/Plan  78 year old with worsening back pain status post lumbar fusion admitted for  pain management and workup.  Tiffany Gill P, MD 03/24/2019, 1:00 PM

## 2019-03-24 NOTE — Progress Notes (Signed)
[facility]ST consulted to start IV for pt to receive pain medication. RN in process of sticking pt second time when pt's husband (on the phone) asked to speak with VAST RN. He wanted to confirm pt's POC for CT scan of recent surgery site as he had spoken with MD. Assured him that CT scan has been ordered, but a specific time for it to be completed cannot be provided. Pt's husband then informed VAST RN pt has a port in place. Thanked him and gave phone back to patient.  Confirmed with patient and asked permission to access port. She didn't think to mention it as she knows special training is required. VAST RN educated that all VAST members are trained to access ports and in the future she could request IV team to access her port instead of being stuck for an IV.Pt verbalized understanding.

## 2019-03-25 MED ORDER — DEXAMETHASONE SODIUM PHOSPHATE 10 MG/ML IJ SOLN
10.0000 mg | Freq: Four times a day (QID) | INTRAMUSCULAR | Status: DC
  Administered 2019-03-25 – 2019-03-29 (×16): 10 mg via INTRAVENOUS
  Filled 2019-03-25 (×16): qty 1

## 2019-03-25 NOTE — Progress Notes (Signed)
Initial Nutrition Assessment   RD working remotely.  DOCUMENTATION CODES:   Not applicable  INTERVENTION:  Provide Ensure Enlive po BID, each supplement provides 350 kcal and 20 grams of protein.  Encourage adequate PO intake.   NUTRITION DIAGNOSIS:   Increased nutrient needs related to post-op healing as evidenced by estimated needs.  GOAL:   Patient will meet greater than or equal to 90% of their needs  MONITOR:   PO intake, Supplement acceptance, Skin, Weight trends, Labs, I & O's  REASON FOR ASSESSMENT:   Malnutrition Screening Tool    ASSESSMENT:   78 year old with worsening back pain status post lumbar fusion admitted for pain management and workup. Pt s/p lumbar interbody fusion 1 week ago.   RD unable to obtain pt nutrition history during attempted time of contact. Meal completion has been 80%. Pt with no weight loss per weight records. RD to order nutritional supplements to aid in increased caloric and protein needs as well as in post op healing.   Unable to complete Nutrition-Focused physical exam at this time.   Labs and medications reviewed.   Diet Order:   Diet Order            Diet regular Room service appropriate? Yes; Fluid consistency: Thin  Diet effective now              EDUCATION NEEDS:   Not appropriate for education at this time  Skin:  Skin Assessment: Skin Integrity Issues: Skin Integrity Issues:: Incisions Incisions: back  Last BM:  6/29  Height:   Ht Readings from Last 1 Encounters:  03/24/19 5' (1.524 m)    Weight:   Wt Readings from Last 1 Encounters:  03/24/19 56.1 kg    Ideal Body Weight:  45.45 kg  BMI:  Body mass index is 24.15 kg/m.  Estimated Nutritional Needs:   Kcal:  1650-1800  Protein:  75-90 grams  Fluid:  1.6- 1.8 L/day    Corrin Parker, MS, RD, LDN Pager # [phone] After hours/ weekend pager # [phone]

## 2019-03-25 NOTE — Evaluation (Signed)
Physical Therapy Evaluation Patient Details Name: Tiffany Gill MRN: [id number] DOB: Mar 30, 1941 Today's Date: 03/25/2019   History of Present Illness  Pt is a 78 y/o female admitted secondary to worsening back pain and LE pain following recent PLIF. PMH includes anxiety, and HTN. Per notes, deciding if pt will require re-exploration of PLIF.   Clinical Impression  Pt admitted secondary to problem above with deficits below. Pt extremely anxious during mobility and very fearful of falling. Pt requiring multiple safety cues to keep hands on RW and to use RW appropriately during gait. Required min A for stability during short distance ambulation. Further limited secondary to pain and anxiety. Feel pt will progress well once pain controlled. Will continue to follow acutely to maximize functional mobility independence and safety.     Follow Up Recommendations Home health PT;Supervision/Assistance - 24 hour(pending progression )    Equipment Recommendations  None recommended by PT    Recommendations for Other Services       Precautions / Restrictions Precautions Precautions: Back;Fall Precaution Booklet Issued: No Precaution Comments: Verbally reviewed back precautions with pt.  Required Braces or Orthoses: Spinal Brace Spinal Brace: Lumbar corset;Applied in sitting position Restrictions Weight Bearing Restrictions: No      Mobility  Bed Mobility Overal bed mobility: Needs Assistance Bed Mobility: Rolling;Sidelying to Sit;Sit to Sidelying Rolling: Min assist Sidelying to sit: Min assist     Sit to sidelying: Supervision General bed mobility comments: Min A for assist with rolling and for trunk  elevation. Cues for use of log roll technique.   Transfers Overall transfer level: Needs assistance Equipment used: Rolling walker (2 wheeled) Transfers: Sit to/from Stand Sit to Stand: Min guard         General transfer comment: Min guard for steadying assist. Cues for safe hand  placement.   Ambulation/Gait Ambulation/Gait assistance: Min assist Gait Distance (Feet): 15 Feet Assistive device: Rolling walker (2 wheeled) Gait Pattern/deviations: Step-through pattern;Decreased stride length;Shuffle Gait velocity: Decreased    General Gait Details: Extremely anxious throughout gait and required multiple cues for safe use of RW. After going to bathroom, pt extremely fearful of falling and releasing RW to grab to door handle, yelling "I'm going to fall!" Reassured pt she was not going to fall and encouraged ambulation back to bed. Min A for steadying assist. Pt reports increased pain as well. Multiple cues for pt to perform pursed lip breathing to calm anxiety.   Stairs            Wheelchair Mobility    Modified Rankin (Stroke Patients Only)       Balance Overall balance assessment: Needs assistance Sitting-balance support: No upper extremity supported;Feet supported Sitting balance-Leahy Scale: Fair     Standing balance support: Bilateral upper extremity supported;During functional activity Standing balance-Leahy Scale: Poor Standing balance comment: Reliant on BUE support                              Pertinent Vitals/Pain Pain Assessment: Faces Faces Pain Scale: Hurts even more Pain Location: Pt reports significant pain in back and is extremely anxious.  Pain Descriptors / Indicators: Grimacing;Guarding;Operative site guarding Pain Intervention(s): Limited activity within patient's tolerance;Monitored during session;Repositioned    Home Living Family/patient expects to be discharged to:: Private residence Living Arrangements: Spouse/significant other Available Help at Discharge: Family;Available 24 hours/day Type of Home: House Home Access: Stairs to enter Entrance Stairs-Rails: Can reach both Entrance Stairs-Number of Steps: 3  Home Layout: Two level;Full bath on main level;Able to live on main level with bedroom/bathroom Home  Equipment: Walker - 2 wheels      Prior Function Level of Independence: Independent with assistive device(s)         Comments: Had been using RW for mobility.      Hand Dominance        Extremity/Trunk Assessment   Upper Extremity Assessment Upper Extremity Assessment: Defer to OT evaluation    Lower Extremity Assessment Lower Extremity Assessment: Generalized weakness    Cervical / Trunk Assessment Cervical / Trunk Assessment: Other exceptions Cervical / Trunk Exceptions: s/p recent PLIF   Communication   Communication: No difficulties  Cognition Arousal/Alertness: Awake/alert Behavior During Therapy: Anxious Overall Cognitive Status: No family/caregiver present to determine baseline cognitive functioning                                 General Comments: Pt extremely anxious throughout session. Required multiple cues for safe use of RW during short distance ambulation. Pt extremely fearful of falling.       General Comments      Exercises     Assessment/Plan    PT Assessment Patient needs continued PT services  PT Problem List Decreased strength;Decreased activity tolerance;Decreased balance;Decreased mobility;Decreased knowledge of use of DME;Decreased safety awareness;Decreased knowledge of precautions;Decreased cognition;Pain;Decreased range of motion       PT Treatment Interventions DME instruction;Gait training;Functional mobility training;Stair training;Therapeutic activities;Therapeutic exercise;Patient/family education;Balance training    PT Goals (Current goals can be found in the Care Plan section)  Acute Rehab PT Goals Patient Stated Goal: to decrease pain  PT Goal Formulation: With patient Time For Goal Achievement: 04/08/19 Potential to Achieve Goals: Good    Frequency Min 3X/week   Barriers to discharge        Co-evaluation               AM-PAC PT "6 Clicks" Mobility  Outcome Measure Help needed turning from  your back to your side while in a flat bed without using bedrails?: A Little Help needed moving from lying on your back to sitting on the side of a flat bed without using bedrails?: A Little Help needed moving to and from a bed to a chair (including a wheelchair)?: A Little Help needed standing up from a chair using your arms (e.g., wheelchair or bedside chair)?: A Little Help needed to walk in hospital room?: A Little Help needed climbing 3-5 steps with a railing? : A Lot 6 Click Score: 17    End of Session Equipment Utilized During Treatment: Gait belt;Back brace Activity Tolerance: Other (comment);Patient limited by pain(limited by anxiety) Patient left: in bed;with call bell/phone within reach;with bed alarm set Nurse Communication: Mobility status PT Visit Diagnosis: Unsteadiness on feet (R26.81);Muscle weakness (generalized) (M62.81);Pain Pain - part of body: (back)    Time: 1696-7893 PT Time Calculation (min) (ACUTE ONLY): 23 min   Charges:   PT Evaluation $PT Eval Moderate Complexity: 1 Mod PT Treatments $Gait Training: 8-22 mins        Leighton Ruff, PT, DPT  Acute Rehabilitation Services  Pager: [phone] Office: [phone]   Rudean Hitt 03/25/2019, 3:28 PM

## 2019-03-25 NOTE — Progress Notes (Signed)
Subjective: Patient reports Patient still with a lot of back pain no radicular pain mostly with mobility  Objective: Vital signs in last 24 hours: Temp:  [97.6 F (36.4 C)-98.6 F (37 C)] 97.9 F (36.6 C) (06/30 0805) Pulse Rate:  [73-89] 78 (06/30 0805) Resp:  [16-19] 18 (06/30 0805) BP: (131-152)/(60-90) 133/84 (06/30 0805) SpO2:  [95 %-100 %] 97 % (06/30 0805) Weight:  [56.1 kg] 56.1 kg (06/29 1257)  Intake/Output from previous day: 06/29 0701 - 06/30 0700 In: 60 [P.O.:60] Out: -  Intake/Output this shift: No intake/output data recorded.  Strength is 5 out of 5  Lab Results: No results for input(s): WBC, HGB, HCT, PLT in the last 72 hours. BMET No results for input(s): NA, K, CL, CO2, GLUCOSE, BUN, CREATININE, CALCIUM in the last 72 hours.  Studies/Results: Ct Lumbar Spine Wo Contrast  Result Date: 03/24/2019 CLINICAL DATA:  Back pain.  Lumbar fusion 1 week ago EXAM: CT LUMBAR SPINE WITHOUT CONTRAST TECHNIQUE: Multidetector CT imaging of the lumbar spine was performed without intravenous contrast administration. Multiplanar CT image reconstructions were also generated. COMPARISON:  Lumbar radiographs March 24, 2019, lumbar MRI 10/28/2018 FINDINGS: Segmentation: Normal Alignment: 4 mm anterolisthesis L4-5 Vertebrae: Chronic compression fracture T12. Mild chronic compression fracture T11. Paraspinal and other soft tissues: Atherosclerotic aorta Negative for paraspinous mass or edema. Disc levels: T12-L1: Negative L1-2: Negative L2-3: Mild disc bulging without spinal stenosis. Mild facet degeneration. L3-4: Mild disc bulging and mild facet degeneration without significant stenosis L4-5: 4 mm anterolisthesis. Bilateral pedicle screw fusion in good position. Bilateral interbody spacers. The right spacer is depressed into the superior endplate of L5. No other postoperative imaging is available to determine if this is new. L5-S1: Mild disc bulging without stenosis. IMPRESSION: PLIF at  L4-5. Subsidence of the right spacer into the superior endplate of L5. Remaining hardware in satisfactory position. No fluid collection. Electronically Signed   By: Franchot Gallo M.D.   On: 03/24/2019 19:54    Assessment/Plan: Patient with CT scan showing subsidence of the right-sided cage but overall CT does not look quite as bad as plain x-ray as alluded to.  It does look like it might fuse in this location.  Screws look to be okay.  I will try her on IV steroids and mobility with physical therapy today and will will see how she mobilizes we still might have to consider reexploration tomorrow the next day.  LOS: 1 day     Katrin Grabel P 03/25/2019, 9:31 AM

## 2019-03-26 MED ORDER — PANTOPRAZOLE SODIUM 40 MG PO TBEC
40.0000 mg | DELAYED_RELEASE_TABLET | Freq: Every day | ORAL | Status: DC
  Administered 2019-03-26 – 2019-04-07 (×12): 40 mg via ORAL
  Filled 2019-03-26 (×13): qty 1

## 2019-03-26 MED ORDER — POLYETHYLENE GLYCOL 3350 17 G PO PACK
17.0000 g | PACK | Freq: Every day | ORAL | Status: DC | PRN
  Administered 2019-03-26 – 2019-04-12 (×5): 17 g via ORAL
  Filled 2019-03-26 (×5): qty 1

## 2019-03-26 NOTE — Progress Notes (Signed)
Physical Therapy Treatment Patient Details Name: Tiffany Gill MRN: [id number] DOB: [birth year] Today's Date: 03/26/2019    History of Present Illness Pt is a 78 y/o female admitted secondary to worsening back pain and LE pain following recent PLIF. PMH includes anxiety, and HTN. Per notes, deciding if pt will require re-exploration of PLIF.     PT Comments    Patient doing well with therapy this morning, less anxious with mobility. Ambulating short distances with guarding. Husband arriving today for caregiver training, arranged by OT. Pt reports her pain is less today than yesterday.     Follow Up Recommendations  Home health PT;Supervision/Assistance - 24 hour(pending progression )     Equipment Recommendations  None recommended by PT    Recommendations for Other Services       Precautions / Restrictions Precautions Precautions: Back;Fall Precaution Booklet Issued: No Precaution Comments: Verbally reviewed back precautions with pt.  Required Braces or Orthoses: Spinal Brace Spinal Brace: Lumbar corset;Applied in sitting position Restrictions Weight Bearing Restrictions: No    Mobility  Bed Mobility Overal bed mobility: Needs Assistance Bed Mobility: Rolling;Sidelying to Sit Rolling: Min guard Sidelying to sit: Supervision     Sit to sidelying: Supervision General bed mobility comments: use of rails, but guided LUE when rolling to R side and minimal use of rail.  Pt sidelying to push off with UB with ease. Once sitting, pt with increased anxiety.  Transfers Overall transfer level: Needs assistance Equipment used: Rolling walker (2 wheeled) Transfers: Sit to/from Stand Sit to Stand: Min guard         General transfer comment: Min guard for steadying assist. Cues for safe hand placement.   Ambulation/Gait Ambulation/Gait assistance: Min guard Gait Distance (Feet): 20 Feet Assistive device: Rolling walker (2 wheeled) Gait Pattern/deviations: Step-through  pattern;Decreased stride length;Shuffle Gait velocity: Decreased    General Gait Details: pt with anxiety medicine prior to session, less anxious than prior PT visit. guard for safety and assurence.    Stairs             Wheelchair Mobility    Modified Rankin (Stroke Patients Only)       Balance Overall balance assessment: Needs assistance Sitting-balance support: No upper extremity supported;Feet supported Sitting balance-Leahy Scale: Fair     Standing balance support: Bilateral upper extremity supported;During functional activity Standing balance-Leahy Scale: Poor Standing balance comment: Reliant on BUE support                             Cognition Arousal/Alertness: Awake/alert Behavior During Therapy: Anxious Overall Cognitive Status: No family/caregiver present to determine baseline cognitive functioning                                 General Comments: Pt extremely anxious throughout session. Required multiple cues for safe use of RW during short distance ambulation. Pt extremely fearful of falling.       Exercises      General Comments General comments (skin integrity, edema, etc.): SPoke with pt's spouse on phone. Spouse is worried about using proper body mechanics and looking forward to hearing from the surgeon to see if pt is healing well. Pt's spouse okayed by nursing staff to come for educational instructions for bed mobility and ambulating.      Pertinent Vitals/Pain Pain Assessment: Faces Faces Pain Scale: Hurts even more Pain Descriptors / Indicators: Grimacing;Guarding;Operative  site guarding Pain Intervention(s): Repositioned;Limited activity within patient's tolerance    Home Living Family/patient expects to be discharged to:: Private residence Living Arrangements: Spouse/significant other Available Help at Discharge: Family;Available 24 hours/day Type of Home: House Home Access: Stairs to enter Entrance  Stairs-Rails: Can reach both Home Layout: Two level;Full bath on main level;Able to live on main level with bedroom/bathroom Home Equipment: Gilford Rile - 2 wheels      Prior Function Level of Independence: Needs assistance  Gait / Transfers Assistance Needed: ambulating with RW ADL's / Homemaking Assistance Needed: requiring assist with donning/doffing brace and LB dressing as pt iwth LB weakness. Comments: Had been using RW for mobility.    PT Goals (current goals can now be found in the care plan section) Acute Rehab PT Goals Patient Stated Goal: to decrease pain  PT Goal Formulation: With patient Time For Goal Achievement: 04/08/19 Potential to Achieve Goals: Good Progress towards PT goals: Progressing toward goals    Frequency    Min 3X/week      PT Plan Current plan remains appropriate    Co-evaluation              AM-PAC PT "6 Clicks" Mobility   Outcome Measure  Help needed turning from your back to your side while in a flat bed without using bedrails?: A Little Help needed moving from lying on your back to sitting on the side of a flat bed without using bedrails?: A Little Help needed moving to and from a bed to a chair (including a wheelchair)?: A Little Help needed standing up from a chair using your arms (e.g., wheelchair or bedside chair)?: A Little Help needed to walk in hospital room?: A Little Help needed climbing 3-5 steps with a railing? : A Lot 6 Click Score: 17    End of Session Equipment Utilized During Treatment: Gait belt;Back brace Activity Tolerance: Other (comment);Patient limited by pain Patient left: in bed;with call bell/phone within reach;with bed alarm set Nurse Communication: Mobility status PT Visit Diagnosis: Unsteadiness on feet (R26.81);Muscle weakness (generalized) (M62.81);Pain     Time: 7062-3762 PT Time Calculation (min) (ACUTE ONLY): 20 min  Charges:  $Gait Training: 8-22 mins                     Reinaldo Berber, PT,  DPT Acute Rehabilitation Services Pager: [phone] Office: [phone]     Reinaldo Berber 03/26/2019, 9:51 AM

## 2019-03-26 NOTE — Evaluation (Signed)
Occupational Therapy Evaluation Patient Details Name: Tiffany Gill MRN: [id number] DOB: 1940/12/17 Today's Date: 03/26/2019    History of Present Illness Pt is a 78 y/o female admitted secondary to worsening back pain and LE pain following recent PLIF. PMH includes anxiety, and HTN. Per notes, deciding if pt will require re-exploration of PLIF.    Clinical Impression   Pt PTA: increased pain after 1st back surgery x1 week ago. Pt's spouse having to assist pt with all self care for lower body and for bed mobility and transfers. Pt currently performing ADL functional transfers and mobility with minA; pt minA for ADL tasks at EOB and set-upA for grooming at sink in sitting. Pt fearful of falling and develops tremors unable to stand more than a few seconds. Pt greatly requires continued OT skilled services for ADL, mobility and safety with education training.  HHOT recommended with 3in1.  OTR spoke with spouse on phone. Pt's spouse will have visitor privilege for education provided from therapy. Pt's spouse agreeable to come for education and agrees that home health will be a great option.      Follow Up Recommendations  Home health OT;Supervision/Assistance - 24 hour    Equipment Recommendations  3 in 1 bedside commode    Recommendations for Other Services       Precautions / Restrictions Precautions Precautions: Back;Fall Precaution Booklet Issued: No Precaution Comments: Verbally reviewed back precautions with pt.  Required Braces or Orthoses: Spinal Brace Spinal Brace: Lumbar corset;Applied in sitting position Restrictions Weight Bearing Restrictions: No      Mobility Bed Mobility Overal bed mobility: Needs Assistance Bed Mobility: Rolling;Sidelying to Sit Rolling: Min guard Sidelying to sit: Supervision     Sit to sidelying: Supervision General bed mobility comments: use of rails, but guided LUE when rolling to R side and minimal use of rail.  Pt sidelying to push off  with UB with ease. Once sitting, pt with increased anxiety.  Transfers Overall transfer level: Needs assistance Equipment used: Rolling walker (2 wheeled) Transfers: Sit to/from Stand Sit to Stand: Min guard         General transfer comment: Min guard for steadying assist. Cues for safe hand placement.     Balance Overall balance assessment: Needs assistance Sitting-balance support: No upper extremity supported;Feet supported Sitting balance-Leahy Scale: Fair     Standing balance support: Bilateral upper extremity supported;During functional activity Standing balance-Leahy Scale: Poor Standing balance comment: Reliant on BUE support                            ADL either performed or assessed with clinical judgement   ADL Overall ADL's : Needs assistance/impaired Eating/Feeding: Modified independent;Sitting   Grooming: Min guard;Sitting   Upper Body Bathing: Minimal assistance;Sitting   Lower Body Bathing: Minimal assistance;Sitting/lateral leans;Sit to/from stand   Upper Body Dressing : Minimal assistance;Sitting;Standing   Lower Body Dressing: Minimal assistance;Sitting/lateral leans;Sit to/from stand   Toilet Transfer: Minimal assistance;BSC   Toileting- Clothing Manipulation and Hygiene: Minimal assistance;Sitting/lateral lean;Sit to/from stand;Cueing for safety;Cueing for sequencing;Adhering to back precautions       Functional mobility during ADLs: Minimal assistance;Rolling walker;Cueing for safety;Cueing for sequencing General ADL Comments: MinA overall for ADL. Pt with RLE weakness and inability to perform figure 4 technique.     Vision Baseline Vision/History: No visual deficits Patient Visual Report: No change from baseline Vision Assessment?: No apparent visual deficits     Perception  Praxis      Pertinent Vitals/Pain Pain Assessment: Faces Faces Pain Scale: Hurts even more Pain Descriptors / Indicators:  Grimacing;Guarding;Operative site guarding Pain Intervention(s): Repositioned;Premedicated before session;Monitored during session     Hand Dominance Right   Extremity/Trunk Assessment Upper Extremity Assessment Upper Extremity Assessment: Overall WFL for tasks assessed   Lower Extremity Assessment Lower Extremity Assessment: Generalized weakness   Cervical / Trunk Assessment Cervical / Trunk Assessment: Other exceptions Cervical / Trunk Exceptions: s/p recent PLIF    Communication Communication Communication: No difficulties   Cognition Arousal/Alertness: Awake/alert Behavior During Therapy: Anxious Overall Cognitive Status: No family/caregiver present to determine baseline cognitive functioning                                 General Comments: Pt extremely anxious throughout session. Required multiple cues for safe use of RW during short distance ambulation. Pt extremely fearful of falling.    General Comments  SPoke with pt's spouse on phone. Spouse is worried about using proper body mechanics and looking forward to hearing from the surgeon to see if pt is healing well. Pt's spouse okayed by nursing staff to come for educational instructions for bed mobility and ambulating.    Exercises     Shoulder Instructions      Home Living Family/patient expects to be discharged to:: Private residence Living Arrangements: Spouse/significant other Available Help at Discharge: Family;Available 24 hours/day Type of Home: House Home Access: Stairs to enter [employer] of Steps: 3 Entrance Stairs-Rails: Can reach both Home Layout: Two level;Full bath on main level;Able to live on main level with bedroom/bathroom Alternate Level Stairs-Number of Steps: full flight Alternate Level Stairs-Rails: Can reach both Bathroom Shower/Tub: Walk-in shower;Door   [employer] Toilet: Standard     Home Equipment: Environmental consultant - 2 wheels          Prior  Functioning/Environment Level of Independence: Needs assistance  Gait / Transfers Assistance Needed: ambulating with RW ADL's / Homemaking Assistance Needed: requiring assist with donning/doffing brace and LB dressing as pt iwth LB weakness.   Comments: Had been using RW for mobility.         OT Problem List: Decreased strength;Decreased activity tolerance;Impaired balance (sitting and/or standing);Decreased coordination;Decreased cognition;Decreased safety awareness;Pain      OT Treatment/Interventions: Self-care/ADL training;Therapeutic exercise;Neuromuscular education;Energy conservation;DME and/or AE instruction;Therapeutic activities;Patient/family education;Balance training    OT Goals(Current goals can be found in the care plan section) Acute Rehab OT Goals Patient Stated Goal: to decrease pain  OT Goal Formulation: With patient Time For Goal Achievement: 04/08/19 Potential to Achieve Goals: Good ADL Goals Pt Will Perform Grooming: with modified independence;standing Pt Will Perform Lower Body Dressing: with modified independence;sitting/lateral leans;sit to/from stand Pt Will Transfer to Toilet: with supervision;ambulating;regular height toilet Pt Will Perform Toileting - Clothing Manipulation and hygiene: with supervision;sit to/from stand Additional ADL Goal #1: Pt will perform ADL with S level tasks in standing with fair balance.  OT Frequency: Min 2X/week   Barriers to D/C:            Co-evaluation              AM-PAC OT "6 Clicks" Daily Activity     Outcome Measure Help from another person eating meals?: None Help from another person taking care of personal grooming?: None Help from another person toileting, which includes using toliet, bedpan, or urinal?: A Little Help from another person bathing (including washing, rinsing,  drying)?: A Lot Help from another person to put on and taking off regular upper body clothing?: A Little Help from another person to  put on and taking off regular lower body clothing?: A Lot 6 Click Score: 18   End of Session Equipment Utilized During Treatment: Gait belt;Rolling walker;Back brace Nurse Communication: Mobility status;Precautions  Activity Tolerance: Patient tolerated treatment well Patient left: in chair;with call bell/phone within reach;with chair alarm set  OT Visit Diagnosis: Unsteadiness on feet (R26.81);Muscle weakness (generalized) (M62.81);Pain Pain - part of body: (back)                Time: 3491-7915 OT Time Calculation (min): 50 min Charges:  OT General Charges $OT Visit: 1 Visit OT Evaluation $OT Eval Moderate Complexity: 1 Mod OT Treatments $Self Care/Home Management : 8-22 mins $Neuromuscular Re-education: 8-22 mins  Darryl Nestle) Marsa Aris OTR/L Acute Rehabilitation Services Pager: [phone] Office: [place] 03/26/2019, 9:37 AM

## 2019-03-26 NOTE — Progress Notes (Signed)
Physical Therapy Treatment Patient Details Name: Tiffany Gill MRN: [id number] DOB: [birth year] Today's Date: 03/26/2019    History of Present Illness Pt is a 78 y/o female admitted secondary to worsening back pain and LE pain following recent PLIF. PMH includes anxiety, and HTN. Per notes, deciding if pt will require re-exploration of PLIF.     PT Comments    Husband arrived to unit, session focused on education. Reviwed precautions, log roll, transfers from low and high surfaces, donning/doffing brace,  guarding during gait. Patients husband confident in ability to assist at home.     Follow Up Recommendations  Home health PT;Supervision/Assistance - 24 hour(pending progression )     Equipment Recommendations  None recommended by PT    Recommendations for Other Services       Precautions / Restrictions Precautions Precautions: Back;Fall Precaution Booklet Issued: No Precaution Comments: Verbally reviewed back precautions with pt.  Required Braces or Orthoses: Spinal Brace Spinal Brace: Lumbar corset;Applied in sitting position Restrictions Weight Bearing Restrictions: No    Mobility  Bed Mobility Overal bed mobility: Needs Assistance Bed Mobility: Rolling;Sidelying to Sit Rolling: Min guard Sidelying to sit: Supervision     Sit to sidelying: Supervision General bed mobility comments: use of rails, but guided LUE when rolling to R side and minimal use of rail.  Pt sidelying to push off with UB with ease. Once sitting, pt with increased anxiety.  Transfers Overall transfer level: Needs assistance Equipment used: Rolling walker (2 wheeled) Transfers: Sit to/from Stand Sit to Stand: Min guard         General transfer comment: Min guard for steadying assist. Cues for safe hand placement.   Ambulation/Gait Ambulation/Gait assistance: Min guard Gait Distance (Feet): 20 Feet Assistive device: Rolling walker (2 wheeled) Gait Pattern/deviations: Step-through  pattern;Decreased stride length;Shuffle Gait velocity: Decreased    General Gait Details: pt with anxiety medicine prior to session, less anxious than prior PT visit. guard for safety and assurence.    Stairs             Wheelchair Mobility    Modified Rankin (Stroke Patients Only)       Balance Overall balance assessment: Needs assistance Sitting-balance support: No upper extremity supported;Feet supported Sitting balance-Leahy Scale: Fair     Standing balance support: Bilateral upper extremity supported;During functional activity Standing balance-Leahy Scale: Poor Standing balance comment: Reliant on BUE support                             Cognition Arousal/Alertness: Awake/alert Behavior During Therapy: Anxious Overall Cognitive Status: No family/caregiver present to determine baseline cognitive functioning                                 General Comments: Pt extremely anxious throughout session. Required multiple cues for safe use of RW during short distance ambulation. Pt extremely fearful of falling.       Exercises      General Comments General comments (skin integrity, edema, etc.): session focused on education with husband, reviwed BLT, log roll, transfers from low and high surfaces, guarding during gait.       Pertinent Vitals/Pain Pain Assessment: Faces Faces Pain Scale: Hurts even more Pain Descriptors / Indicators: Grimacing;Guarding;Operative site guarding Pain Intervention(s): Repositioned;Limited activity within patient's tolerance    Home Living Family/patient expects to be discharged to:: Private residence Living Arrangements: Spouse/significant other  Available Help at Discharge: Family;Available 24 hours/day Type of Home: House Home Access: Stairs to enter Entrance Stairs-Rails: Can reach both Home Layout: Two level;Full bath on main level;Able to live on main level with bedroom/bathroom Home Equipment: Gilford Rile -  2 wheels      Prior Function Level of Independence: Needs assistance  Gait / Transfers Assistance Needed: ambulating with RW ADL's / Homemaking Assistance Needed: requiring assist with donning/doffing brace and LB dressing as pt iwth LB weakness. Comments: Had been using RW for mobility.    PT Goals (current goals can now be found in the care plan section) Acute Rehab PT Goals Patient Stated Goal: to decrease pain  PT Goal Formulation: With patient Time For Goal Achievement: 04/08/19 Potential to Achieve Goals: Good Progress towards PT goals: Progressing toward goals    Frequency    Min 3X/week      PT Plan Current plan remains appropriate    Co-evaluation              AM-PAC PT "6 Clicks" Mobility   Outcome Measure  Help needed turning from your back to your side while in a flat bed without using bedrails?: A Little Help needed moving from lying on your back to sitting on the side of a flat bed without using bedrails?: A Little Help needed moving to and from a bed to a chair (including a wheelchair)?: A Little Help needed standing up from a chair using your arms (e.g., wheelchair or bedside chair)?: A Little Help needed to walk in hospital room?: A Little Help needed climbing 3-5 steps with a railing? : A Lot 6 Click Score: 17    End of Session Equipment Utilized During Treatment: Gait belt;Back brace Activity Tolerance: Other (comment);Patient limited by pain Patient left: in bed;with call bell/phone within reach;with bed alarm set Nurse Communication: Mobility status PT Visit Diagnosis: Unsteadiness on feet (R26.81);Muscle weakness (generalized) (M62.81);Pain     Time: 1050-1105 PT Time Calculation (min) (ACUTE ONLY): 15 min  Charges:   $Self Care/Home Management: 8-22                    Reinaldo Berber, PT, DPT Acute Rehabilitation Services Pager: [phone] Office: [phone]     Reinaldo Berber 03/26/2019, 11:17 AM

## 2019-03-27 ENCOUNTER — Inpatient Hospital Stay (HOSPITAL_COMMUNITY): Payer: Medicare Other

## 2019-03-27 MED ORDER — GABAPENTIN 300 MG PO CAPS
300.0000 mg | ORAL_CAPSULE | Freq: Two times a day (BID) | ORAL | Status: DC
  Administered 2019-03-27 – 2019-03-28 (×3): 300 mg via ORAL
  Filled 2019-03-27 (×3): qty 1

## 2019-03-27 NOTE — Plan of Care (Signed)
Patient is using call light properly and waiting for assistance before getting out of bed to ambulate.

## 2019-03-27 NOTE — Progress Notes (Signed)
Patient ID: Tiffany Gill, female   DOB: Apr 12, 1941, 78 y.o.   MRN: [id number] Patient with little more pain back and left hip this morning.  Still denies any radicular pain been doing better yesterday afternoon however had an exacerbation last night.  Denies any new numbness or tingling  Awake alert strength 5 out of 5 incision clean dry and intact  Will slowly mobilize with physical therapy today and try to get her pain under better control.

## 2019-03-27 NOTE — Care Management Important Message (Signed)
Important Message  Patient Details  Name: Tiffany Gill MRN: [id number] Date of Birth: 1941/04/29   Medicare Important Message Given:  Yes     Orbie Pyo 03/27/2019, 1:29 PM

## 2019-03-27 NOTE — Progress Notes (Signed)
Physical Therapy Treatment Patient Details Name: Tiffany Gill MRN: [id number] DOB: March 14, 1941 Today's Date: 03/27/2019    History of Present Illness Pt is a 78 y/o female admitted secondary to worsening back pain and LE pain following recent PLIF. PMH includes anxiety, and HTN. Per notes, deciding if pt will require re-exploration of PLIF.     PT Comments    Patient seen for mobility progression. Pt demonstrates impaired balance and requires increased assistance for OOB mobility this session. Pt with posterior bias with initial standing and with several LOB while ambulating requiring min/mod A. Continue to progress as tolerated.     Follow Up Recommendations  Home health PT;Supervision/Assistance - 24 hour     Equipment Recommendations  None recommended by PT    Recommendations for Other Services       Precautions / Restrictions Precautions Precautions: Back;Fall Precaution Comments: pt able to recall 2/3 back precautions Required Braces or Orthoses: Spinal Brace Spinal Brace: Lumbar corset;Applied in sitting position    Mobility  Bed Mobility Overal bed mobility: Needs Assistance Bed Mobility: Rolling;Sidelying to Sit Rolling: Min guard Sidelying to sit: Min guard       General bed mobility comments: pt with posterior bias in sitting and needs cues to correct posture; increased time and effort; use of rails; min guard for safety and cues for log roll technique  Transfers Overall transfer level: Needs assistance Equipment used: Rolling walker (2 wheeled) Transfers: Sit to/from Stand Sit to Stand: Min assist         General transfer comment: assist for balance upon standing; pt with posterior bias and stood then sat down EOB and stood second time both times requiring assistance for balance; cues for safe hand placement   Ambulation/Gait Ambulation/Gait assistance: Min assist;Mod assist Gait Distance (Feet): 70 Feet Assistive device: Rolling walker (2  wheeled) Gait Pattern/deviations: Step-through pattern;Decreased step length - right;Decreased step length - left;Narrow base of support Gait velocity: Decreased    General Gait Details: assistance required for balance and guiding RW; pt with multiple LOB; cues and assistance for safe use of AD; pt requires increased assistance with gait distance and required seated break in hallway and then recliner brought out to pt as it was unsafe to attempt ambulating back to room rest of the way; increased bilat LE weakness (L > R)   Stairs             Wheelchair Mobility    Modified Rankin (Stroke Patients Only)       Balance Overall balance assessment: Needs assistance Sitting-balance support: No upper extremity supported;Feet supported Sitting balance-Leahy Scale: Fair     Standing balance support: Bilateral upper extremity supported;During functional activity Standing balance-Leahy Scale: Poor                              Cognition Arousal/Alertness: Awake/alert Behavior During Therapy: Anxious Overall Cognitive Status: Impaired/Different from baseline Area of Impairment: Attention;Memory;Safety/judgement;Awareness                   Current Attention Level: Alternating Memory: Decreased short-term memory;Decreased recall of precautions   Safety/Judgement: Decreased awareness of safety;Decreased awareness of deficits Awareness: Emergent          Exercises      General Comments        Pertinent Vitals/Pain Pain Assessment: Faces Faces Pain Scale: Hurts little more Pain Location: back  Pain Descriptors / Indicators: Guarding;Aching Pain Intervention(s): Limited  activity within patient's tolerance;Monitored during session;Repositioned    Home Living                      Prior Function            PT Goals (current goals can now be found in the care plan section) Acute Rehab PT Goals Patient Stated Goal: to decrease pain  Progress  towards PT goals: Progressing toward goals    Frequency    Min 3X/week      PT Plan Current plan remains appropriate    Co-evaluation              AM-PAC PT "6 Clicks" Mobility   Outcome Measure  Help needed turning from your back to your side while in a flat bed without using bedrails?: A Little Help needed moving from lying on your back to sitting on the side of a flat bed without using bedrails?: A Little Help needed moving to and from a bed to a chair (including a wheelchair)?: A Little Help needed standing up from a chair using your arms (e.g., wheelchair or bedside chair)?: A Little Help needed to walk in hospital room?: A Lot Help needed climbing 3-5 steps with a railing? : A Lot 6 Click Score: 16    End of Session Equipment Utilized During Treatment: Gait belt;Back brace Activity Tolerance: Patient limited by fatigue Patient left: in chair;with call bell/phone within reach;with chair alarm set Nurse Communication: Mobility status PT Visit Diagnosis: Unsteadiness on feet (R26.81);Muscle weakness (generalized) (M62.81);Pain Pain - part of body: (back)     Time: 9233-0076 PT Time Calculation (min) (ACUTE ONLY): 32 min  Charges:  $Gait Training: 23-37 mins                     Earney Navy, PTA Acute Rehabilitation Services Pager: [phone] Office: [phone]     Darliss Cheney 03/27/2019, 4:25 PM

## 2019-03-27 NOTE — Progress Notes (Signed)
The patient does not need IV services for port needle change. The next needle change is scheduled for 7/6

## 2019-03-28 ENCOUNTER — Inpatient Hospital Stay (HOSPITAL_COMMUNITY): Payer: Medicare Other

## 2019-03-28 LAB — BASIC METABOLIC PANEL
Anion gap: 11 (ref 5–15)
BUN: 38 mg/dL — ABNORMAL HIGH (ref 8–23)
CO2: 23 mmol/L (ref 22–32)
Calcium: 9 mg/dL (ref 8.9–10.3)
Chloride: 100 mmol/L (ref 98–111)
Creatinine, Ser: 1.18 mg/dL — ABNORMAL HIGH (ref 0.44–1.00)
GFR calc Af Amer: 52 mL/min — ABNORMAL LOW (ref >60)
GFR calc non Af Amer: 44 mL/min — ABNORMAL LOW (ref >60)
Glucose, Bld: 145 mg/dL — ABNORMAL HIGH (ref 70–99)
Potassium: 4.3 mmol/L (ref 3.5–5.1)
Sodium: 134 mmol/L — ABNORMAL LOW (ref 135–145)

## 2019-03-28 LAB — CBC WITH DIFFERENTIAL/PLATELET
Abs Immature Granulocytes: 0.14 10*3/uL — ABNORMAL HIGH (ref 0.00–0.07)
Basophils Absolute: 0 10*3/uL (ref 0.0–0.1)
Basophils Relative: 0 %
Eosinophils Absolute: 0 10*3/uL (ref 0.0–0.5)
Eosinophils Relative: 0 %
HCT: 34.5 % — ABNORMAL LOW (ref 36.0–46.0)
Hemoglobin: 11.7 g/dL — ABNORMAL LOW (ref 12.0–15.0)
Immature Granulocytes: 1 %
Lymphocytes Relative: 3 %
Lymphs Abs: 0.4 10*3/uL — ABNORMAL LOW (ref 0.7–4.0)
MCH: 32.3 pg (ref 26.0–34.0)
MCHC: 33.9 g/dL (ref 30.0–36.0)
MCV: 95.3 fL (ref 80.0–100.0)
Monocytes Absolute: 0.3 10*3/uL (ref 0.1–1.0)
Monocytes Relative: 2 %
Neutro Abs: 13.2 10*3/uL — ABNORMAL HIGH (ref 1.7–7.7)
Neutrophils Relative %: 94 %
Platelets: 336 10*3/uL (ref 150–400)
RBC: 3.62 MIL/uL — ABNORMAL LOW (ref 3.87–5.11)
RDW: 12.3 % (ref 11.5–15.5)
WBC: 14 10*3/uL — ABNORMAL HIGH (ref 4.0–10.5)
nRBC: 0 % (ref 0.0–0.2)

## 2019-03-28 MED ORDER — OXYCODONE HCL 5 MG PO TABS
5.0000 mg | ORAL_TABLET | ORAL | Status: DC | PRN
  Administered 2019-03-28 – 2019-04-03 (×19): 5 mg via ORAL
  Filled 2019-03-28 (×20): qty 1

## 2019-03-28 NOTE — Progress Notes (Signed)
Occupational Therapy Treatment Patient Details Name: Tiffany Gill MRN: [id number] DOB: 1941-05-03 Today's Date: 03/28/2019    History of present illness Pt is a 78 y/o female admitted secondary to worsening back pain and LE pain following recent PLIF. PMH includes anxiety, and HTN. Per notes, deciding if pt will require re-exploration of PLIF.    OT comments  Pt declining this session. Pt Education on the importance of moving. ModA for bed mobility rolling and sidelying to EOB. Pt grimmacing in pain with BLE movement and unable to bear weight on LLE in standing stating "it feels like something is stabbing me.' Pt more lethargic and not following all commands. (RN aware of changes) Pt requiring increased assist today no standing task achieved for more than 1 minute with minA for power up. Pt sitting EOB and minA for donning brace. Pt attempting to lift BLEs to fix socks, but pt with too severe of pain to perform with hip hike technique. Pt not tolerating session well. Breaks required in sitting and lying as pt immediately returning to sitting when LLE hurts. Pt greatly requires higher level of post acute care to CIR for ADL, safety and mobility. OT to follow acutely.  OT feels that pt is too much for her spouse to care for at home now the requires increased assist for ADL and mobility due to pain and lethargy.     Follow Up Recommendations  CIR;Supervision/Assistance - 24 hour(CIR vs SNF as pt declining since previous OT visit.)    Equipment Recommendations       Recommendations for Other Services Rehab consult    Precautions / Restrictions Precautions Precautions: Back;Fall Required Braces or Orthoses: Spinal Brace Spinal Brace: Lumbar corset;Applied in sitting position Restrictions Weight Bearing Restrictions: No       Mobility Bed Mobility Overal bed mobility: Needs Assistance Bed Mobility: Sit to Sidelying;Sidelying to Sit;Rolling Rolling: Mod assist Sidelying to sit: Mod  assist;HOB elevated     Sit to sidelying: Mod assist;HOB elevated General bed mobility comments: assist to bring bilat LE into bed and multimodal cues for sequencing   Transfers Overall transfer level: Needs assistance Equipment used: Rolling walker (2 wheeled) Transfers: Sit to/from Stand Sit to Stand: Min assist         General transfer comment: Pt with posterior lean    Balance Overall balance assessment: Needs assistance Sitting-balance support: Single extremity supported;Feet supported Sitting balance-Leahy Scale: Fair     Standing balance support: Bilateral upper extremity supported;During functional activity Standing balance-Leahy Scale: Poor Standing balance comment: Pt unable to tolerate standing > 1 min today                           ADL either performed or assessed with clinical judgement   ADL Overall ADL's : Needs assistance/impaired Eating/Feeding: Minimal assistance;Sitting Eating/Feeding Details (indicate cue type and reason): unable to successfully bring cup with straw to mouth Grooming: Minimal assistance;Sitting;Cueing for safety;Cueing for sequencing   Upper Body Bathing: Moderate assistance;Sitting   Lower Body Bathing: Maximal assistance;Sitting/lateral leans;Sit to/from stand Lower Body Bathing Details (indicate cue type and reason): unable to process LB AE even as a review from earlier this week, Upper Body Dressing : Moderate assistance;Sitting   Lower Body Dressing: Maximal assistance;Cueing for safety;Cueing for sequencing   Toilet Transfer: Moderate assistance;BSC   Toileting- Clothing Manipulation and Hygiene: Moderate assistance;Sitting/lateral lean;Sit to/from stand       Functional mobility during ADLs: Moderate assistance;Rolling walker General  ADL Comments: Education on the importance of moving. Pt grimmacing in pain with BLE movement and unable to bear weight on LLE in standing. Pt more lethargic and not following all  commands. Pt requiring increased assist today no standing task achieved.     Vision   Vision Assessment?: No apparent visual deficits   Perception     Praxis      Cognition Arousal/Alertness: Awake/alert Behavior During Therapy: Anxious Overall Cognitive Status: No family/caregiver present to determine baseline cognitive functioning Area of Impairment: Attention;Memory;Safety/judgement;Awareness;Problem solving                   Current Attention Level: Alternating Memory: Decreased short-term memory;Decreased recall of precautions   Safety/Judgement: Decreased awareness of safety;Decreased awareness of deficits Awareness: Emergent Problem Solving: Difficulty sequencing;Requires verbal cues;Requires tactile cues General Comments: Pt has no insight into deficits. Pt stating "I can walk to the sink," but in reality, pt unable to take more than 2 steps due to L leg pain.        Exercises     Shoulder Instructions       General Comments Pt having increased difficulty today. Pt was more calm requiring less cues for anxiety, but with any movement, pt in severe pain s/p facial grimmacing in L leg.  "feels like something is stabbing me." RN aware. Pt unable to stand >1 minute and unable to weight bear on LLE. Rn aware and suggested to stop heavy pain meds since pt was not following all commands.    Pertinent Vitals/ Pain       Pain Assessment: Faces Faces Pain Scale: Hurts even more Pain Location: back, hips (L > R), L LE Pain Descriptors / Indicators: Guarding;Sore;Aching Pain Intervention(s): Limited activity within patient's tolerance  Home Living                                          Prior Functioning/Environment              Frequency  Min 2X/week        Progress Toward Goals  OT Goals(current goals can now be found in the care plan section)  Progress towards OT goals: Progressing toward goals  Acute Rehab OT Goals Patient  Stated Goal: to decrease pain  OT Goal Formulation: With patient Time For Goal Achievement: 04/08/19 Potential to Achieve Goals: Good ADL Goals Pt Will Perform Grooming: with modified independence;standing Pt Will Perform Lower Body Dressing: with modified independence;sitting/lateral leans;sit to/from stand Pt Will Transfer to Toilet: with supervision;ambulating;regular height toilet Pt Will Perform Toileting - Clothing Manipulation and hygiene: with supervision;sit to/from stand Additional ADL Goal #1: Pt will perform ADL with S level tasks in standing with fair balance.  Plan Discharge plan needs to be updated    Co-evaluation                 AM-PAC OT "6 Clicks" Daily Activity     Outcome Measure   Help from another person eating meals?: A Little Help from another person taking care of personal grooming?: A Little Help from another person toileting, which includes using toliet, bedpan, or urinal?: A Lot Help from another person bathing (including washing, rinsing, drying)?: A Lot Help from another person to put on and taking off regular upper body clothing?: A Lot Help from another person to put on and taking off regular lower body clothing?:  A Lot 6 Click Score: 14    End of Session Equipment Utilized During Treatment: Gait belt;Rolling walker;Back brace  OT Visit Diagnosis: Unsteadiness on feet (R26.81);Muscle weakness (generalized) (M62.81);Pain Pain - Right/Left: Left Pain - part of body: Leg   Activity Tolerance Patient limited by pain;Treatment limited secondary to medical complications (Comment)   Patient Left in bed;with call bell/phone within reach;with bed alarm set   Nurse Communication Mobility status;Precautions        Time: 2778-2423 OT Time Calculation (min): 26 min  Charges: OT General Charges $OT Visit: 1 Visit OT Treatments $Self Care/Home Management : 8-22 mins $Therapeutic Activity: 8-22 mins  Darryl Nestle) Marsa Aris OTR/L Acute  Rehabilitation Services Pager: [phone] Office: [place] 03/28/2019, 2:01 PM

## 2019-03-28 NOTE — Progress Notes (Signed)
Rehab Admissions Coordinator Note:  Patient was screened by Cleatrice Burke for appropriateness for an Inpatient Acute Rehab Consult per PT and OT recommendation change for increased pain limiting her at this time. If patient fails to progress to be able to go home tomorrow, consider an inpt rehab consult to see if patient would be a candidate to admit early next week. Noted Spouse has been in for caregiver training with therapy .   Cleatrice Burke 03/28/2019, 2:26 PM  I can be reached at [phone].

## 2019-03-28 NOTE — Progress Notes (Cosign Needed Addendum)
Physical Therapy Treatment Patient Details Name: Tiffany Gill MRN: [id number] DOB: Feb 23, 1941 Today's Date: 03/28/2019    History of Present Illness Pt is a 78 y/o female admitted secondary to worsening back pain and LE pain following recent PLIF. PMH includes anxiety, and HTN. Per notes, deciding if pt will require re-exploration of PLIF.     PT Comments    Patient seen for mobility progression.  Pt presents with impaired balance/overall mobility and decreased awareness of safety/deficits. Pt requires min/mod A for safe functional transfers and short distance gait training. Gait distance limited by increasing L LE pain.  Given pt's current mobility level recommending CIR for further skilled PT services to maximize independence and safety with mobility.    Follow Up Recommendations  CIR    Equipment Recommendations  TBD    Recommendations for Other Services       Precautions / Restrictions Precautions Precautions: Back;Fall Required Braces or Orthoses: Spinal Brace Spinal Brace: Lumbar corset;Applied in sitting position    Mobility  Bed Mobility Overal bed mobility: Needs Assistance Bed Mobility: Sit to Sidelying         Sit to sidelying: Min assist General bed mobility comments: assist to bring bilat LE into bed and multimodal cues for sequencing   Transfers Overall transfer level: Needs assistance Equipment used: Rolling walker (2 wheeled) Transfers: Sit to/from Stand Sit to Stand: Min assist         General transfer comment: assist for balance upon standing; pt with posterior bias and bracing with bilat LE against chair/EOB; cues for safe hand placement required   Ambulation/Gait Ambulation/Gait assistance: Min assist;Mod assist Gait Distance (Feet): 45 Feet Assistive device: Rolling walker (2 wheeled) Gait Pattern/deviations: Step-through pattern;Decreased step length - right;Decreased step length - left;Narrow base of support;Decreased dorsiflexion -  left;Decreased weight shift to left Gait velocity: Decreased    General Gait Details: assistance required for balance and guiding RW; pt with poor bilat LE coordination and short step lengths/height; pt requires seated break and then attempted to ambulate back toward room however pt unable to take more than two steps and required assistance to sit safely back down in recliner   Stairs             Wheelchair Mobility    Modified Rankin (Stroke Patients Only)       Balance Overall balance assessment: Needs assistance Sitting-balance support: No upper extremity supported;Feet supported Sitting balance-Leahy Scale: Fair     Standing balance support: Bilateral upper extremity supported;During functional activity Standing balance-Leahy Scale: Poor                              Cognition Arousal/Alertness: Awake/alert Behavior During Therapy: Anxious Overall Cognitive Status: No family/caregiver present to determine baseline cognitive functioning Area of Impairment: Attention;Memory;Safety/judgement;Awareness;Problem solving                   Current Attention Level: Alternating Memory: Decreased short-term memory;Decreased recall of precautions   Safety/Judgement: Decreased awareness of safety;Decreased awareness of deficits Awareness: Emergent Problem Solving: Difficulty sequencing;Requires verbal cues;Requires tactile cues General Comments: pt has very little insight into deficits and poor safety awareness; upon arrival pt attempting to get up from recliner with leg rest still up and close to falling in the floor; pt reports calling for assistance to get up however call light was not on; pt appears confused throughout session and calling the toothpaste "glasses"  Exercises      General Comments        Pertinent Vitals/Pain Pain Assessment: Faces Faces Pain Scale: Hurts even more Pain Location: back, hips (L > R), L LE Pain Descriptors /  Indicators: Guarding;Sore;Aching Pain Intervention(s): Limited activity within patient's tolerance;Monitored during session;Repositioned    Home Living                      Prior Function            PT Goals (current goals can now be found in the care plan section) Acute Rehab PT Goals Patient Stated Goal: to decrease pain  Progress towards PT goals: Progressing toward goals    Frequency    Min 3X/week      PT Plan Current plan remains appropriate    Co-evaluation              AM-PAC PT "6 Clicks" Mobility   Outcome Measure  Help needed turning from your back to your side while in a flat bed without using bedrails?: A Little Help needed moving from lying on your back to sitting on the side of a flat bed without using bedrails?: A Little Help needed moving to and from a bed to a chair (including a wheelchair)?: A Little Help needed standing up from a chair using your arms (e.g., wheelchair or bedside chair)?: A Little Help needed to walk in hospital room?: A Lot Help needed climbing 3-5 steps with a railing? : A Lot 6 Click Score: 16    End of Session Equipment Utilized During Treatment: Gait belt;Back brace Activity Tolerance: Patient limited by fatigue;Patient limited by pain Patient left: with call bell/phone within reach;in bed;with bed alarm set Nurse Communication: Mobility status PT Visit Diagnosis: Unsteadiness on feet (R26.81);Muscle weakness (generalized) (M62.81);Pain Pain - part of body: (back)     Time: 7017-7939 PT Time Calculation (min) (ACUTE ONLY): 24 min  Charges:  $Gait Training: 23-37 mins                     Tiffany Gill, PTA Acute Rehabilitation Services Pager: [phone] Office: [phone]     Tiffany Gill 03/28/2019, 12:17 PM

## 2019-03-28 NOTE — Progress Notes (Signed)
  NEUROSURGERY PROGRESS NOTE   No issues overnight.  Complains of worsening L hip pain Back pain appropriate sore Does not feel ready to go home today, although optimistic for d/c tomorrow  EXAM:  BP (!) 150/72 (BP Location: Left Arm)   Pulse 72   Temp (!) 97.5 F (36.4 C) (Oral)   Resp 16   Ht 5' (1.524 m)   Wt 56.1 kg   SpO2 100%   BMI 24.15 kg/m   Awake, alert, oriented  Speech fluent, appropriate  CN grossly intact  5/5 BUE/BLE  Incision c/d/i  PLAN Stable neurologically Pain still limiting factor Likely d/c tomorrow

## 2019-03-28 NOTE — Progress Notes (Signed)
  NEUROSURGERY PROGRESS NOTE   Received call from patient's husband regarding some concerns.  1. Left hip pain: reports she fell prior to readmission landing on left hip. Concerned there is underlying pathology that is not being addressed. CT hip ordered. 2. Confusion: While talking to her on the phone, he believes she is significantly confused.  Believes it began when Neurontin was started. Will d/c neurontin. My conversation with her this am was pleasant. She was responding appropriately, and was actually saying she would go home tomorrow assuming today was a good day. Will order basic labs, UA in addition to d/c neurontin. Will reassess in the morning.  Time spent with husband on phone: 80min  Vincent Costella, PA-C [facility]

## 2019-03-29 ENCOUNTER — Inpatient Hospital Stay (HOSPITAL_COMMUNITY): Payer: Medicare Other

## 2019-03-29 LAB — COMPREHENSIVE METABOLIC PANEL
ALT: 45 U/L — ABNORMAL HIGH (ref 0–44)
AST: 27 U/L (ref 15–41)
Albumin: 3.1 g/dL — ABNORMAL LOW (ref 3.5–5.0)
Alkaline Phosphatase: 82 U/L (ref 38–126)
Anion gap: 10 (ref 5–15)
BUN: 39 mg/dL — ABNORMAL HIGH (ref 8–23)
CO2: 24 mmol/L (ref 22–32)
Calcium: 9.2 mg/dL (ref 8.9–10.3)
Chloride: 101 mmol/L (ref 98–111)
Creatinine, Ser: 1.12 mg/dL — ABNORMAL HIGH (ref 0.44–1.00)
GFR calc Af Amer: 55 mL/min — ABNORMAL LOW (ref >60)
GFR calc non Af Amer: 47 mL/min — ABNORMAL LOW (ref >60)
Glucose, Bld: 99 mg/dL (ref 70–99)
Potassium: 4.3 mmol/L (ref 3.5–5.1)
Sodium: 135 mmol/L (ref 135–145)
Total Bilirubin: 0.7 mg/dL (ref 0.3–1.2)
Total Protein: 5.8 g/dL — ABNORMAL LOW (ref 6.5–8.1)

## 2019-03-29 LAB — URINALYSIS, ROUTINE W REFLEX MICROSCOPIC
Bilirubin Urine: NEGATIVE
Glucose, UA: NEGATIVE mg/dL
Hgb urine dipstick: NEGATIVE
Ketones, ur: NEGATIVE mg/dL
Leukocytes,Ua: NEGATIVE
Nitrite: NEGATIVE
Protein, ur: NEGATIVE mg/dL
Specific Gravity, Urine: 1.015 (ref 1.005–1.030)
pH: 7 (ref 5.0–8.0)

## 2019-03-29 MED ORDER — LIDOCAINE 5 % EX PTCH
1.0000 | MEDICATED_PATCH | CUTANEOUS | Status: DC
  Administered 2019-03-29 – 2019-04-14 (×14): 1 via TRANSDERMAL
  Filled 2019-03-29 (×16): qty 1

## 2019-03-29 MED ORDER — LIDOCAINE HCL (PF) 1 % IJ SOLN
INTRAMUSCULAR | Status: AC
  Administered 2019-03-29: 18:00:00
  Filled 2019-03-29: qty 5

## 2019-03-29 NOTE — Progress Notes (Signed)
Pt refused to stay in bed most of the night. She was confused to where she was and why her husband wasn't here. She did get out of bed at one point voicing her desire to get dressed, get her purse, and go home.  The majority of the evening was spent w/ pt climbing out of bed in order to use the bathroom, but she urinated on the floor as well. M.Celestina Gironda,RN

## 2019-03-29 NOTE — Significant Event (Signed)
Rapid Response Event Note  Overview: Time Called: 5072 Arrival Time: 1544 Event Type: Other (Comment)(unwitnessed fall)  Initial Focused Assessment/Interventions:  Per staff admitted with confusion and AMS post lumbar back surgery Today patient had an unwitnessed fall.  Per staff she was found supine on the floor under the bedside table and sink, with her head on the leg/wheel of the table.   Upon my arrival she is calling out for her husband unable to answer questions.  She has a right posterior head lac but is able to move all extremities.  Since she can't endorse or deny neck pain, she was placed in a C collar.  Lifted to the bed. BP 138/75  HR 98 RR 18  O2 sat 100% on 2L [place] Placed 4x4 gauze over head lac, minimal bleeding at this time.  Dr Zada Finders notified of event and patient status:  Orders received for head and neck CT. Husband notified by RN  Transported to radiology for CT, uneventful. Dr Zada Finders at bedside to assess patient:  Cleared C spine and removed collar.  Plan staples to head lac.    Plan of Care (if not transferred):  Event Summary: Name of Physician Notified: [place] at [place]   Outcome: Stayed in room and stabalized  Event End Time: [place]  Raliegh Ip

## 2019-03-29 NOTE — Progress Notes (Signed)
PT Cancellation Note  Patient Details Name: Tiffany Gill MRN: [id number] DOB: [birth year]   Cancelled Treatment:    Reason Eval/Treat Not Completed: Other (comment)(Pt refused. Very confused this AM)  PT quotes "what are you going to do with the kids around here".  "cut these things off me" referring to mittens.   Pt stated she was not going to get out of bed with me and wanted me to leave her room.      Melvern Banker 03/29/2019, 8:35 AM Lavonia Dana, PT   Acute Rehabilitation Services  Pager [phone] Office [phone] 03/29/2019

## 2019-03-29 NOTE — Progress Notes (Addendum)
Neurosurgery Service Progress Note  Subjective: No acute events overnight, had confusion with PT and nursing overnight   Objective: Vitals:   03/28/19 2330 03/29/19 0337 03/29/19 0749 03/29/19 1149  BP: 132/66 (!) 175/84 (!) 156/81 (!) 169/84  Pulse: 74 78 76 87  Resp: 18 18 20 20   Temp: 98.2 F (36.8 C) 97.7 F (36.5 C) 97.6 F (36.4 C) 97.7 F (36.5 C)  TempSrc: Oral Oral Oral Oral  SpO2: 95%  98% 98%  Weight:      Height:       Temp (24hrs), Avg:97.7 F (36.5 C), Min:97.4 F (36.3 C), Max:98.2 F (36.8 C)  CBC Latest Ref Rng & Units 03/28/2019 03/13/2019  WBC 4.0 - 10.5 K/uL 14.0(H) 7.6  Hemoglobin 12.0 - 15.0 g/dL 11.7(L) 15.8(H)  Hematocrit 36.0 - 46.0 % 34.5(L) 47.3(H)  Platelets 150 - 400 K/uL 336 288   BMP Latest Ref Rng & Units 03/28/2019 03/13/2019  Glucose 70 - 99 mg/dL 145(H) 102(H)  BUN 8 - 23 mg/dL 38(H) 11  Creatinine 0.44 - 1.00 mg/dL 1.18(H) 1.07(H)  Sodium 135 - 145 mmol/L 134(L) 139  Potassium 3.5 - 5.1 mmol/L 4.3 3.6  Chloride 98 - 111 mmol/L 100 107  CO2 22 - 32 mmol/L 23 23  Calcium 8.9 - 10.3 mg/dL 9.0 9.9    Intake/Output Summary (Last 24 hours) at 03/29/2019 1154 Last data filed at 03/28/2019 2200 Gross per 24 hour  Intake 3 ml  Output -  Net 3 ml    Current Facility-Administered Medications:  .  0.9 %  sodium chloride infusion, 250 mL, Intravenous, Continuous, Kary Kos, MD .  acetaminophen (TYLENOL) tablet 650 mg, 650 mg, Oral, Q4H PRN, 650 mg at 03/28/19 0843 **OR** acetaminophen (TYLENOL) suppository 650 mg, 650 mg, Rectal, Q4H PRN, Kary Kos, MD .  alum & mag hydroxide-simeth (MAALOX/MYLANTA) 200-200-20 MG/5ML suspension 30 mL, 30 mL, Oral, Q6H PRN, Kary Kos, MD .  amLODipine (NORVASC) tablet 2.5 mg, 2.5 mg, Oral, Daily, Kary Kos, MD, 2.5 mg at 03/29/19 0913 .  ARIPiprazole (ABILIFY) tablet 2 mg, 2 mg, Oral, Daily, Kary Kos, MD, 2 mg at 03/29/19 0914 .  buPROPion (WELLBUTRIN XL) 24 hr tablet 300 mg, 300 mg, Oral, Daily, Kary Kos,  MD, 300 mg at 03/29/19 0914 .  cholecalciferol (VITAMIN D3) tablet 1,000 Units, 1,000 Units, Oral, Daily, Kary Kos, MD, 1,000 Units at 03/29/19 [phone] .  clonazePAM (KLONOPIN) tablet 0.5 mg, 0.5 mg, Oral, TID PRN, Kary Kos, MD, 0.5 mg at 03/29/19 0916 .  cyclobenzaprine (FLEXERIL) tablet 10 mg, 10 mg, Oral, TID PRN, Kary Kos, MD, 10 mg at 03/28/19 0250 .  DULoxetine (CYMBALTA) DR capsule 60 mg, 60 mg, Oral, Daily, Kary Kos, MD, 60 mg at 03/29/19 0915 .  feeding supplement (ENSURE ENLIVE) (ENSURE ENLIVE) liquid 237 mL, 237 mL, Oral, BID BM, Kary Kos, MD, 237 mL at 03/29/19 0917 .  HYDROmorphone (DILAUDID) injection 0.5 mg, 0.5 mg, Intravenous, Q3H PRN, Kary Kos, MD, 0.5 mg at 03/29/19 0939 .  levothyroxine (SYNTHROID) tablet 100 mcg, 100 mcg, Oral, QAC breakfast, Kary Kos, MD, 100 mcg at 03/29/19 0656 .  losartan (COZAAR) tablet 50 mg, 50 mg, Oral, Daily, Kary Kos, MD, 50 mg at 03/29/19 0917 .  menthol-cetylpyridinium (CEPACOL) lozenge 3 mg, 1 lozenge, Oral, PRN **OR** phenol (CHLORASEPTIC) mouth spray 1 spray, 1 spray, Mouth/Throat, PRN, Kary Kos, MD .  ondansetron (ZOFRAN) tablet 4 mg, 4 mg, Oral, Q6H PRN **OR** ondansetron (ZOFRAN) injection 4 mg, 4 mg, Intravenous,  Q6H PRN, Kary Kos, MD .  oxyCODONE (Oxy IR/ROXICODONE) immediate release tablet 5 mg, 5 mg, Oral, Q3H PRN, Kary Kos, MD, 5 mg at 03/28/19 2337 .  pantoprazole (PROTONIX) EC tablet 40 mg, 40 mg, Oral, QHS, Kary Kos, MD, 40 mg at 03/28/19 2330 .  polyethylene glycol (MIRALAX / GLYCOLAX) packet 17 g, 17 g, Oral, Daily PRN, Kary Kos, MD, 17 g at 03/26/19 2217 .  pravastatin (PRAVACHOL) tablet 40 mg, 40 mg, Oral, QPM, Kary Kos, MD, 40 mg at 03/28/19 1650 .  sodium chloride flush (NS) 0.9 % injection 10-40 mL, 10-40 mL, Intracatheter, Q12H, Kary Kos, MD, 10 mL at 03/29/19 0918 .  sodium chloride flush (NS) 0.9 % injection 10-40 mL, 10-40 mL, Intracatheter, PRN, Kary Kos, MD .  sodium chloride flush (NS) 0.9 %  injection 3 mL, 3 mL, Intravenous, Q12H, Kary Kos, MD, 3 mL at 03/29/19 0918 .  sodium chloride flush (NS) 0.9 % injection 3 mL, 3 mL, Intravenous, PRN, Kary Kos, MD .  zolpidem (AMBIEN) tablet 5 mg, 5 mg, Oral, QHS PRN, Kary Kos, MD   Physical Exam: AOx3, PERRL, EOMI, FS, Strength 5/5 x4, SILTx4 Incision c/d/i  Assessment & Plan: 78 y.o. woman s/p prior L4-5 PLIF then readmitted for low back pain, now with some confusion and hallucinations.  -has been on 10q6 of dex, high chance of steroid-induced psychosis, will d/c dex and see if she improves -d/c cyclobenzaprine -gabapentin already d/c'd -CT L hip negative for fracture -add lidoderm patches for pain control -UA neg -BMP w/ hyponatremia, will rpt to confirm -Cr increased from preop baseline, will rpt to confirm +/- starting IVF  Judith Part  03/29/19 11:54 AM

## 2019-03-29 NOTE — Progress Notes (Signed)
Patient yelling for husband went to go check on patient and she was on the floor with head on bedside table and back of head bleeding, VSS

## 2019-03-29 NOTE — Progress Notes (Signed)
Pt fell and struck her head. CTH, compared to her prior MRI, stable CVID, hydro ex vacuo, R occ encephalomalacia, no frx. 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 Will f/u final read for the incidental findings, no acute findings, will clear collar.  Pt has an occipital head laceration measuring roughly 3cm with some bleeding. Wound irrigated with NS, gave 0.5mg  hydromorphone for procedural analgesia, 1% lidocaine to wound edges, then used a surgical stapler to re-approximate. Will need staples out in follow up appointment with Dr. Saintclair Halsted.

## 2019-03-29 NOTE — Progress Notes (Signed)
Occupational Therapy Treatment Patient Details Name: Tiffany Gill MRN: [id number] DOB: [birth year] Today's Date: 03/29/2019    History of present illness Pt is a 78 y/o female admitted secondary to worsening back pain and LE pain following recent PLIF. PMH includes anxiety, and HTN. Per notes, deciding if pt will require re-exploration of PLIF.    OT comments  Pt very confused today - she is restless and indicates pain.  She was assisted with repositioning in the bed.  She required mod A for rolling, max A for sidelying <> sit, and mod A to stand and take 3 steps up EOB.  She is oriented only to self today.  She thinks she is at home.  She is able to briefly recognize she not at home, but this is fleeting.  She has periods of increased agitation, pushing therapist away and insisting on being left alone - max A for redirection.  She was unable to sequence donning her sock, and required total A for all aspects.    Follow Up Recommendations  CIR;Supervision/Assistance - 24 hour    Equipment Recommendations  3 in 1 bedside commode    Recommendations for Other Services Rehab consult    Precautions / Restrictions Precautions Precautions: Back;Fall Precaution Booklet Issued: No Precaution Comments: Pt unable to recall back precautions.  She was unaware that she has had back surgery  Required Braces or Orthoses: Spinal Brace Spinal Brace: Lumbar corset;Applied in sitting position Restrictions Weight Bearing Restrictions: No       Mobility Bed Mobility Overal bed mobility: Needs Assistance Bed Mobility: Rolling;Sidelying to Sit;Sit to Sidelying Rolling: Mod assist Sidelying to sit: Max assist     Sit to sidelying: Max assist General bed mobility comments: Pt requires step by step cues, assist to initiate all aspects of mobility, and assist to roll, move LEs off of and onto the bed and to lift and lower trunk   Transfers Overall transfer level: Needs assistance Equipment used: 1  person hand held assist Transfers: Sit to/from Stand Sit to Stand: Mod assist         General transfer comment: max cues to initiate and mod A to stand and side step up EOB     Balance Overall balance assessment: Needs assistance Sitting-balance support: Bilateral upper extremity supported Sitting balance-Leahy Scale: Poor Sitting balance - Comments: requries min A    Standing balance support: Bilateral upper extremity supported;During functional activity Standing balance-Leahy Scale: Poor Standing balance comment: Pt stood briefly to side step up EOB to reposition - requires mod A                            ADL either performed or assessed with clinical judgement   ADL                       Lower Body Dressing: Total assistance;Bed level Lower Body Dressing Details (indicate cue type and reason): Pt required max A and cues to iniate donning sock, but unable to follow through with activity, and unable to sustain attention   Toilet Transfer: Moderate assistance   Toileting- Clothing Manipulation and Hygiene: Total assistance       Functional mobility during ADLs: Moderate assistance       Vision       Perception     Praxis      Cognition Arousal/Alertness: Awake/alert Behavior During Therapy: Restless;Impulsive;Anxious;Agitated Overall Cognitive Status: Impaired/Different from baseline Area of  Impairment: Orientation;Attention;Memory;Following commands;Safety/judgement;Awareness;Problem solving                 Orientation Level: Disoriented to;Place;Time;Situation Current Attention Level: Focused Memory: Decreased recall of precautions Following Commands: Follows one step commands inconsistently;Follows one step commands with increased time Safety/Judgement: Decreased awareness of safety;Decreased awareness of deficits   Problem Solving: Slow processing;Decreased initiation;Difficulty sequencing;Requires verbal cues;Requires tactile  cues General Comments: Pt very confused.  She thinks she is at home, but when she was prompted to look around, she was able to briefly recognize she was at hospital, but that was fleeting.   She escalates quickly, pushing therapist away and insisting on being left alone         Exercises     Shoulder Instructions       General Comments MD notified of pt performance, and cognition     Pertinent Vitals/ Pain       Pain Assessment: Faces Faces Pain Scale: Hurts whole lot Pain Location: back.  Pt very resteless and confused  Pain Descriptors / Indicators: Moaning;Restless;Guarding Pain Intervention(s): Limited activity within patient's tolerance;Monitored during session;Repositioned  Home Living                                          Prior Functioning/Environment              Frequency  Min 2X/week        Progress Toward Goals  OT Goals(current goals can now be found in the care plan section)  Progress towards OT goals: Not progressing toward goals - comment(confusion )     Plan Discharge plan remains appropriate    Co-evaluation                 AM-PAC OT "6 Clicks" Daily Activity     Outcome Measure   Help from another person eating meals?: A Lot Help from another person taking care of personal grooming?: Total Help from another person toileting, which includes using toliet, bedpan, or urinal?: A Lot Help from another person bathing (including washing, rinsing, drying)?: Total Help from another person to put on and taking off regular upper body clothing?: Total Help from another person to put on and taking off regular lower body clothing?: Total 6 Click Score: 8    End of Session    OT Visit Diagnosis: Unsteadiness on feet (R26.81);Muscle weakness (generalized) (M62.81);Pain Pain - part of body: (back )   Activity Tolerance Patient limited by pain;Other (comment)(impaired cognition )   Patient Left in bed;with call bell/phone  within reach;with bed alarm set   Nurse Communication Mobility status        Time: 1517-6160 OT Time Calculation (min): 11 min  Charges: OT General Charges $OT Visit: 1 Visit OT Treatments $Therapeutic Activity: 8-22 mins  Lucille Passy, OTR/L [place] Pager [phone] Office [place], [place] 03/29/2019, 11:29 AM

## 2019-03-30 ENCOUNTER — Telehealth (HOSPITAL_COMMUNITY): Payer: Self-pay | Admitting: Neurology

## 2019-03-30 MED ORDER — SODIUM CHLORIDE 0.9 % IV BOLUS
500.0000 mL | Freq: Once | INTRAVENOUS | Status: AC
  Administered 2019-03-30: 500 mL via INTRAVENOUS

## 2019-03-30 MED ORDER — HEPARIN SODIUM (PORCINE) 5000 UNIT/ML IJ SOLN
5000.0000 [IU] | Freq: Three times a day (TID) | INTRAMUSCULAR | Status: DC
  Administered 2019-03-30 – 2019-04-14 (×35): 5000 [IU] via SUBCUTANEOUS
  Filled 2019-03-30 (×43): qty 1

## 2019-03-30 NOTE — Telephone Encounter (Signed)
Unusual situation.I returned phone call from answering service from this patient's husband who happens to know me who was calling to get some help for his wife Tiffany Gill who was admitted to [facility] under neurosurgery and is having persistent back pain.  He wanted to know whom to reach out to.  Advised him that Dr. Venetia Constable is on-call and is likely covering for Dr. Saintclair Halsted who is on vacation.  I advised him to call the patient's nurse and send a message to Dr. Venetia Constable to call the him and discuss his wife condition and pain management.

## 2019-03-30 NOTE — Progress Notes (Addendum)
Neurosurgery Service Progress Note  Subjective: NAE ON, some calling out this morning per nursing, but when I saw her today, she was remarkably closer to cognitive baseline  Objective: Vitals:   03/29/19 1639 03/29/19 1953 03/29/19 2313 03/30/19 0323  BP: (!) 134/59 136/72 (!) 152/99 (!) 161/73  Pulse: 93 82 91 87  Resp: 16 19 18 18   Temp:  98.1 F (36.7 C) 97.7 F (36.5 C) 97.7 F (36.5 C)  TempSrc:  Oral Oral Oral  SpO2: 100% 98% 96% 95%  Weight:      Height:       Temp (24hrs), Avg:97.8 F (36.6 C), Min:97.6 F (36.4 C), Max:98.1 F (36.7 C)  CBC Latest Ref Rng & Units 03/28/2019 03/13/2019  WBC 4.0 - 10.5 K/uL 14.0(H) 7.6  Hemoglobin 12.0 - 15.0 g/dL 11.7(L) 15.8(H)  Hematocrit 36.0 - 46.0 % 34.5(L) 47.3(H)  Platelets 150 - 400 K/uL 336 288   BMP Latest Ref Rng & Units 03/29/2019 03/28/2019 03/13/2019  Glucose 70 - 99 mg/dL 99 145(H) 102(H)  BUN 8 - 23 mg/dL 39(H) 38(H) 11  Creatinine 0.44 - 1.00 mg/dL 1.12(H) 1.18(H) 1.07(H)  Sodium 135 - 145 mmol/L 135 134(L) 139  Potassium 3.5 - 5.1 mmol/L 4.3 4.3 3.6  Chloride 98 - 111 mmol/L 101 100 107  CO2 22 - 32 mmol/L 24 23 23   Calcium 8.9 - 10.3 mg/dL 9.2 9.0 9.9    Intake/Output Summary (Last 24 hours) at 03/30/2019 0512 Last data filed at 03/30/2019 0145 Gross per 24 hour  Intake 120 ml  Output 25 ml  Net 95 ml    Current Facility-Administered Medications:  .  0.9 %  sodium chloride infusion, 250 mL, Intravenous, Continuous, Kary Kos, MD .  acetaminophen (TYLENOL) tablet 650 mg, 650 mg, Oral, Q4H PRN, 650 mg at 03/28/19 0843 **OR** acetaminophen (TYLENOL) suppository 650 mg, 650 mg, Rectal, Q4H PRN, Kary Kos, MD .  alum & mag hydroxide-simeth (MAALOX/MYLANTA) 200-200-20 MG/5ML suspension 30 mL, 30 mL, Oral, Q6H PRN, Kary Kos, MD .  amLODipine (NORVASC) tablet 2.5 mg, 2.5 mg, Oral, Daily, Kary Kos, MD, 2.5 mg at 03/29/19 0913 .  ARIPiprazole (ABILIFY) tablet 2 mg, 2 mg, Oral, Daily, Kary Kos, MD, 2 mg at 03/29/19  0914 .  buPROPion (WELLBUTRIN XL) 24 hr tablet 300 mg, 300 mg, Oral, Daily, Kary Kos, MD, 300 mg at 03/29/19 0914 .  cholecalciferol (VITAMIN D3) tablet 1,000 Units, 1,000 Units, Oral, Daily, Kary Kos, MD, 1,000 Units at 03/29/19 [phone] .  clonazePAM (KLONOPIN) tablet 0.5 mg, 0.5 mg, Oral, TID PRN, Kary Kos, MD, 0.5 mg at 03/29/19 0916 .  DULoxetine (CYMBALTA) DR capsule 60 mg, 60 mg, Oral, Daily, Kary Kos, MD, 60 mg at 03/29/19 0915 .  feeding supplement (ENSURE ENLIVE) (ENSURE ENLIVE) liquid 237 mL, 237 mL, Oral, BID BM, Kary Kos, MD, 237 mL at 03/29/19 1517 .  HYDROmorphone (DILAUDID) injection 0.5 mg, 0.5 mg, Intravenous, Q3H PRN, Kary Kos, MD, 0.5 mg at 03/30/19 8338 .  levothyroxine (SYNTHROID) tablet 100 mcg, 100 mcg, Oral, QAC breakfast, Kary Kos, MD, 100 mcg at 03/29/19 0656 .  lidocaine (LIDODERM) 5 % 1 patch, 1 patch, Transdermal, Q24H, Ostergard, Joyice Faster, MD, 1 patch at 03/29/19 1226 .  losartan (COZAAR) tablet 50 mg, 50 mg, Oral, Daily, Kary Kos, MD, 50 mg at 03/29/19 0917 .  menthol-cetylpyridinium (CEPACOL) lozenge 3 mg, 1 lozenge, Oral, PRN **OR** phenol (CHLORASEPTIC) mouth spray 1 spray, 1 spray, Mouth/Throat, PRN, Kary Kos, MD .  ondansetron (  ZOFRAN) tablet 4 mg, 4 mg, Oral, Q6H PRN **OR** ondansetron (ZOFRAN) injection 4 mg, 4 mg, Intravenous, Q6H PRN, Kary Kos, MD .  oxyCODONE (Oxy IR/ROXICODONE) immediate release tablet 5 mg, 5 mg, Oral, Q3H PRN, Kary Kos, MD, 5 mg at 03/28/19 2337 .  pantoprazole (PROTONIX) EC tablet 40 mg, 40 mg, Oral, QHS, Kary Kos, MD, 40 mg at 03/29/19 2107 .  polyethylene glycol (MIRALAX / GLYCOLAX) packet 17 g, 17 g, Oral, Daily PRN, Kary Kos, MD, 17 g at 03/26/19 2217 .  pravastatin (PRAVACHOL) tablet 40 mg, 40 mg, Oral, QPM, Kary Kos, MD, 40 mg at 03/29/19 1812 .  sodium chloride flush (NS) 0.9 % injection 10-40 mL, 10-40 mL, Intracatheter, Q12H, Kary Kos, MD, 10 mL at 03/29/19 2200 .  sodium chloride flush (NS) 0.9 %  injection 10-40 mL, 10-40 mL, Intracatheter, PRN, Kary Kos, MD .  sodium chloride flush (NS) 0.9 % injection 3 mL, 3 mL, Intravenous, Q12H, Kary Kos, MD, 3 mL at 03/29/19 2200 .  sodium chloride flush (NS) 0.9 % injection 3 mL, 3 mL, Intravenous, PRN, Kary Kos, MD   Physical Exam: AOx3, PERRL, EOMI, FS, Strength 5/5 x4, SILTx4 Incision c/d/i  Assessment & Plan: 78 y.o. woman s/p prior L4-5 PLIF then readmitted for low back pain, now with some confusion and hallucinations.  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 Grossly stable from preop labs, unlikely to be the primary cause of her confusion. No need to adjust dose of aripiprazole, bupropion, duloxetine. Will give 500cc bolus in case her hyponatremia is hypovolemic hyponatremia  Heme/ID - no issues  Dispo/PPx -SCDs/TEDs/SQH -dispo pending diagnosis or resolution of her delirium / psychosis  Addendum: I was in the operating room with multiple emergent cases today. I informed the husband of this and, given the patient's improving exam when I rounded, reassured him that I could speak with him later. I have now had a chance to update him on the plan of care. 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   Joyice Faster Ostergard  03/30/19 5:12 AM

## 2019-03-30 NOTE — Progress Notes (Signed)
Spoke with patient husband Simona Huh. He requests a call and update on plan of care from the Neurosurgery team. Message left with [facility] Neurosurgery answering service. Goals for patient today discussed with husband include pain control, and preventing injury and falls.

## 2019-03-31 ENCOUNTER — Encounter (HOSPITAL_COMMUNITY): Payer: Self-pay | Admitting: Physical Medicine and Rehabilitation

## 2019-03-31 DIAGNOSIS — G894 Chronic pain syndrome: Secondary | ICD-10-CM

## 2019-03-31 DIAGNOSIS — M5442 Lumbago with sciatica, left side: Secondary | ICD-10-CM

## 2019-03-31 DIAGNOSIS — N183 Chronic kidney disease, stage 3 unspecified: Secondary | ICD-10-CM

## 2019-03-31 DIAGNOSIS — M5441 Lumbago with sciatica, right side: Secondary | ICD-10-CM

## 2019-03-31 DIAGNOSIS — F329 Major depressive disorder, single episode, unspecified: Secondary | ICD-10-CM

## 2019-03-31 DIAGNOSIS — M549 Dorsalgia, unspecified: Secondary | ICD-10-CM

## 2019-03-31 DIAGNOSIS — F419 Anxiety disorder, unspecified: Secondary | ICD-10-CM

## 2019-03-31 DIAGNOSIS — D62 Acute posthemorrhagic anemia: Secondary | ICD-10-CM

## 2019-03-31 DIAGNOSIS — G8929 Other chronic pain: Secondary | ICD-10-CM

## 2019-03-31 NOTE — Progress Notes (Addendum)
Neurosurgery Service Progress Note  Subjective: NAE ON, no new complaints this morning. Less oriented and complaining of more pain in the buttock this morning.  Objective: Vitals:   03/30/19 1938 03/30/19 2314 03/31/19 0328 03/31/19 0839  BP: (!) 147/76 134/67 136/90 (!) 161/78  Pulse: 87 90 85 85  Resp:    18  Temp: 97.8 F (36.6 C) 97.9 F (36.6 C) 97.7 F (36.5 C) 98 F (36.7 C)  TempSrc: Oral Oral Oral Oral  SpO2: 95% 95% 95% 96%  Weight:      Height:       Temp (24hrs), Avg:97.8 F (36.6 C), Min:97.6 F (36.4 C), Max:98 F (36.7 C)  CBC Latest Ref Rng & Units 03/28/2019 03/13/2019  WBC 4.0 - 10.5 K/uL 14.0(H) 7.6  Hemoglobin 12.0 - 15.0 g/dL 11.7(L) 15.8(H)  Hematocrit 36.0 - 46.0 % 34.5(L) 47.3(H)  Platelets 150 - 400 K/uL 336 288   BMP Latest Ref Rng & Units 03/29/2019 03/28/2019 03/13/2019  Glucose 70 - 99 mg/dL 99 145(H) 102(H)  BUN 8 - 23 mg/dL 39(H) 38(H) 11  Creatinine 0.44 - 1.00 mg/dL 1.12(H) 1.18(H) 1.07(H)  Sodium 135 - 145 mmol/L 135 134(L) 139  Potassium 3.5 - 5.1 mmol/L 4.3 4.3 3.6  Chloride 98 - 111 mmol/L 101 100 107  CO2 22 - 32 mmol/L 24 23 23   Calcium 8.9 - 10.3 mg/dL 9.2 9.0 9.9    Intake/Output Summary (Last 24 hours) at 03/31/2019 1145 Last data filed at 03/30/2019 1700 Gross per 24 hour  Intake 360 ml  Output -  Net 360 ml    Current Facility-Administered Medications:  .  0.9 %  sodium chloride infusion, 250 mL, Intravenous, Continuous, Kary Kos, MD .  acetaminophen (TYLENOL) tablet 650 mg, 650 mg, Oral, Q4H PRN, 650 mg at 03/28/19 0843 **OR** acetaminophen (TYLENOL) suppository 650 mg, 650 mg, Rectal, Q4H PRN, Kary Kos, MD .  alum & mag hydroxide-simeth (MAALOX/MYLANTA) 200-200-20 MG/5ML suspension 30 mL, 30 mL, Oral, Q6H PRN, Kary Kos, MD .  amLODipine (NORVASC) tablet 2.5 mg, 2.5 mg, Oral, Daily, Kary Kos, MD, 2.5 mg at 03/31/19 0948 .  ARIPiprazole (ABILIFY) tablet 2 mg, 2 mg, Oral, Daily, Kary Kos, MD, 2 mg at 03/31/19 0948 .   buPROPion (WELLBUTRIN XL) 24 hr tablet 300 mg, 300 mg, Oral, Daily, Kary Kos, MD, 300 mg at 03/31/19 0949 .  cholecalciferol (VITAMIN D3) tablet 1,000 Units, 1,000 Units, Oral, Daily, Kary Kos, MD, 1,000 Units at 03/31/19 [phone] .  clonazePAM (KLONOPIN) tablet 0.5 mg, 0.5 mg, Oral, TID PRN, Kary Kos, MD, 0.5 mg at 03/29/19 0916 .  DULoxetine (CYMBALTA) DR capsule 60 mg, 60 mg, Oral, Daily, Kary Kos, MD, 60 mg at 03/31/19 0948 .  feeding supplement (ENSURE ENLIVE) (ENSURE ENLIVE) liquid 237 mL, 237 mL, Oral, BID BM, Kary Kos, MD, 237 mL at 03/30/19 1425 .  heparin injection 5,000 Units, 5,000 Units, Subcutaneous, Q8H, Judith Part, MD, 5,000 Units at 03/30/19 [phone] .  HYDROmorphone (DILAUDID) injection 0.5 mg, 0.5 mg, Intravenous, Q3H PRN, Kary Kos, MD, 0.5 mg at 03/30/19 1149 .  levothyroxine (SYNTHROID) tablet 100 mcg, 100 mcg, Oral, QAC breakfast, Kary Kos, MD, 100 mcg at 03/31/19 0601 .  lidocaine (LIDODERM) 5 % 1 patch, 1 patch, Transdermal, Q24H, Damareon Lanni, Joyice Faster, MD, 1 patch at 03/30/19 1148 .  losartan (COZAAR) tablet 50 mg, 50 mg, Oral, Daily, Kary Kos, MD, 50 mg at 03/31/19 0948 .  menthol-cetylpyridinium (CEPACOL) lozenge 3 mg, 1 lozenge, Oral, PRN **  OR** phenol (CHLORASEPTIC) mouth spray 1 spray, 1 spray, Mouth/Throat, PRN, Kary Kos, MD .  ondansetron (ZOFRAN) tablet 4 mg, 4 mg, Oral, Q6H PRN **OR** ondansetron (ZOFRAN) injection 4 mg, 4 mg, Intravenous, Q6H PRN, Kary Kos, MD .  oxyCODONE (Oxy IR/ROXICODONE) immediate release tablet 5 mg, 5 mg, Oral, Q3H PRN, Kary Kos, MD, 5 mg at 03/31/19 0949 .  pantoprazole (PROTONIX) EC tablet 40 mg, 40 mg, Oral, QHS, Kary Kos, MD, 40 mg at 03/30/19 2121 .  polyethylene glycol (MIRALAX / GLYCOLAX) packet 17 g, 17 g, Oral, Daily PRN, Kary Kos, MD, 17 g at 03/26/19 2217 .  pravastatin (PRAVACHOL) tablet 40 mg, 40 mg, Oral, QPM, Kary Kos, MD, 40 mg at 03/30/19 1725 .  sodium chloride flush (NS) 0.9 % injection 10-40 mL,  10-40 mL, Intracatheter, Q12H, Kary Kos, MD, 10 mL at 03/31/19 0950 .  sodium chloride flush (NS) 0.9 % injection 10-40 mL, 10-40 mL, Intracatheter, PRN, Kary Kos, MD .  sodium chloride flush (NS) 0.9 % injection 3 mL, 3 mL, Intravenous, Q12H, Kary Kos, MD, 3 mL at 03/30/19 2121 .  sodium chloride flush (NS) 0.9 % injection 3 mL, 3 mL, Intravenous, PRN, Kary Kos, MD   Physical Exam: AOx3, PERRL, EOMI, FS, Strength 5/5 x4, SILTx4 Incision c/d/i  Assessment & Plan: 78 y.o. woman s/p prior L4-5 PLIF then readmitted for low back pain, now with some confusion and hallucinations.  Neuro -keep holding dex -orientation waxing and waning. I do not see a clear cause, likely multifactorial. I think that we should try and get her out of the hospital to try and minimize this component of her delirium. PT recommending CIR, will c/s PM&R -d/c'd cyclobenzaprine and gabapentin -lidoderm patches for pain control and to minimize systemic pharm -had some drainage on her dressing this evening when I rounded, she isn't tender over her incision so I was able to push quite hard on it and could not get any further drainage. Swapped out the dressing and will continue to monitor  Cardiopulm - no issues  FENGI  -CMP confirmed borderline hyponatremia, mild renal dysfunction with GFR of 47. Grossly stable from preop labs, unlikely to be the primary cause of her confusion. No need to adjust dose of aripiprazole, bupropion, duloxetine. Will give 500cc bolus in case her hyponatremia is hypovolemic hyponatremia  Heme/ID - no issues  Dispo/PPx -SCDs/TEDs/SQH -dispo pending diagnosis or resolution of her delirium / psychosis  I updated the husband on her plan of care and answered all of his questions  Judith Part  03/31/19 11:45 AM

## 2019-03-31 NOTE — Progress Notes (Signed)
Dr Zada Finders CMA called RN to inform her MD in OR and not in the office; pt spouse already left to go home. Delia Heady RN

## 2019-03-31 NOTE — Progress Notes (Signed)
Nutrition Follow-up   RD working remotely.  DOCUMENTATION CODES:   Not applicable  INTERVENTION:  Continue Ensure Enlive po BID, each supplement provides 350 kcal and 20 grams of protein  Encourage adequate PO intake.   NUTRITION DIAGNOSIS:   Increased nutrient needs related to post-op healing as evidenced by estimated needs; ongoing  GOAL:   Patient will meet greater than or equal to 90% of their needs; progressing  MONITOR:   PO intake, Supplement acceptance, Skin, Weight trends, Labs, I & O's  REASON FOR ASSESSMENT:   Malnutrition Screening Tool    ASSESSMENT:   78 year old with worsening back pain status post lumbar fusion admitted for pain management and workup.  Pt with confusion and hallucinations per MD. Pt waxing and waning with cause likely multifactorial. Meal completion has been 15-40%. Pt currently has Ensure ordered and has been consuming them. RD to continue with current orders to aid in caloric and protein needs.   Diet Order:   Diet Order            Diet regular Room service appropriate? No; Fluid consistency: Thin  Diet effective now              EDUCATION NEEDS:   Not appropriate for education at this time  Skin:  Skin Assessment: Skin Integrity Issues: Skin Integrity Issues:: Incisions Incisions: back  Last BM:  7/6  Height:   Ht Readings from Last 1 Encounters:  03/24/19 5' (1.524 m)    Weight:   Wt Readings from Last 1 Encounters:  03/24/19 56.1 kg    Ideal Body Weight:  45.45 kg  BMI:  Body mass index is 24.15 kg/m.  Estimated Nutritional Needs:   Kcal:  1650-1800  Protein:  75-90 grams  Fluid:  1.6- 1.8 L/day   Corrin Parker, MS, RD, LDN Pager # [phone] After hours/ weekend pager # [phone]

## 2019-03-31 NOTE — Progress Notes (Signed)
Received pt in bed alert but confused  .Disoriented to place and time , denied pain but c/o severe pain to back and both hip when lifting up both legs. Refused RN to look at back incision, but MD came in  was able to look at back surgical incision, dressing off old serosanguineous stain noted    , 4x 4 gauze dressing applied. RN will continue to monitor  Safety measures in place and pt reoriented to time and place and reminded to call [facility] staff using the call light or call on the phone. Phone # on the white board. Pt demonstrated some understanding.

## 2019-03-31 NOTE — Care Management Important Message (Signed)
Important Message  Patient Details  Name: Tiffany Gill MRN: [id number] Date of Birth: 08-27-1941   Medicare Important Message Given:  Yes     Orbie Pyo 03/31/2019, 2:34 PM

## 2019-03-31 NOTE — Progress Notes (Signed)
Inpatient Rehabilitation Admissions Coordinator  Inpatient rehab consult received. I met with patient an her spouse at bedside. Pain limiting factor will all mobility. Noted decline in function with pain and now her increased confusion. Spouse states he can not even raise her head of the bed to eat her meals due to her severe pain. I explained that Dr. Posey Pronto feels she is not a candidate to admit to CIR and that once pain tolerable with decreased confusion, hopefully she can d/c home. We will sign off at this time. RN CM, Vida Roller, aware of our recommendation.  Danne Baxter, RN, MSN Rehab Admissions Coordinator [phone] 03/31/2019 3:04 PM

## 2019-03-31 NOTE — Progress Notes (Signed)
Pt husband called and requested to come visit pt and speak with MD; spouse updated on hospital visitation restriction; Director Darius notified and OK for spouse to visit. Husband notified director OK for him to visit. Delia Heady RN

## 2019-03-31 NOTE — Progress Notes (Signed)
Pt spouse at bedside requested to see or speak with MD; Dr Zada Finders office called and notified x2 with no response. Delia Heady RN

## 2019-03-31 NOTE — Consult Note (Signed)
Physical Medicine and Rehabilitation Consult   Reason for Consult: Functional deficits due to encephalopathy Referring Physician: Dr. Zada Finders   HPI: Tiffany Gill is a 78 y.o. female with history of anxiety, depression s/p ECT,memory loss, chronic pain, lumbar spondylolisthesis with radiculopathy s/p decompressive laminectomy L4/5 by Dr. Saintclair Halsted on 03/18/2019.  History taken from chart review and husband.  She was discharged to home the next day and has had progressive worsening of LBP with bilateral hip pain. She was seen at [facility] office and X rays showed concerns of cage displacement. She was admitted 03/24/19 for work up and CT spine done revealing subsidence of right spacer into superior end plate of L5 with remaining hardware in satisfactory position.  She was treated with Decadron with improvement in pain, however developed agitation, which was thought to be due to Neurontin.  Per reports, patient had fall on to left hip PTA and CT hip 03/28/2019 showed no acute changes or focal fluid collection.  She was found on the floor 03/29/2019 with posterior head laceration and CT head/neck done for work up.  CT head did not show any acute intracranial abnormalities.  Therapy ongoing and patient has had decline. CIR recommended due to functional deficits with delirium.  Hospital course was complicated by hypertension.   Review of Systems  Constitutional: Negative for chills and fever.  HENT: Negative for hearing loss and tinnitus.   Eyes: Negative for blurred vision and double vision.  Respiratory: Positive for cough and shortness of breath (at times due to anxiety).   Cardiovascular: Negative for chest pain and palpitations.  Gastrointestinal: Negative for heartburn.  Genitourinary: Negative for dysuria.  Musculoskeletal: Positive for back pain.  Neurological: Positive for focal weakness and weakness. Negative for dizziness and headaches.  Psychiatric/Behavioral: Positive for memory loss. The  patient is nervous/anxious.   All other systems reviewed and are negative.    Past Medical History:  Diagnosis Date   Anxiety    Depression    High cholesterol    Hypertension    Hypothyroidism    Osteoarthritis     Past Surgical History:  Procedure Laterality Date   DECOMPRESSIVE LUMBAR LAMINECTOMY LEVEL 4  02/2019   PARATHYROIDECTOMY       Family History  Problem Relation Age of Onset   Stroke Mother    Heart disease Father    Heart disease Brother      Social History:  Married. Independent PTA. Independent with walker PTA. She reports that she has never smoked. She has never used smokeless tobacco. She reports current alcohol use of about 3.0 standard drinks of alcohol per week. She reports that she does not use drugs.    Allergies  Allergen Reactions   Metronidazole Anaphylaxis and Other (See Comments)    Seizure    Medications Prior to Admission  Medication Sig Dispense Refill   amLODipine (NORVASC) 2.5 MG tablet Take 2.5 mg by mouth daily.     ARIPiprazole (ABILIFY) 2 MG tablet Take 2 mg by mouth daily.     buPROPion (WELLBUTRIN XL) 300 MG 24 hr tablet Take 300 mg by mouth daily.     Cholecalciferol (VITAMIN D3) 25 MCG (1000 UT) CAPS Take 1,000 Units by mouth daily.     clonazePAM (KLONOPIN) 0.5 MG tablet Take 0.5 mg by mouth 3 (three) times daily as needed for anxiety.      DULoxetine (CYMBALTA) 60 MG capsule Take 60 mg by mouth daily.     HYDROcodone-acetaminophen ([place])  10-325 MG tablet Take 1 tablet by mouth every 6 (six) hours as needed for moderate pain.     ibandronate (BONIVA) 150 MG tablet Take 150 mg by mouth every 30 (thirty) days.     L-Methylfolate-Algae (DEPLIN 15) 15-90.314 MG CAPS Take 15 mg by mouth daily.     losartan (COZAAR) 50 MG tablet Take 50 mg by mouth daily.     pravastatin (PRAVACHOL) 40 MG tablet Take 40 mg by mouth every evening.     predniSONE (DELTASONE) 10 MG tablet Take 10 mg by mouth daily with  breakfast.     SYNTHROID 100 MCG tablet Take 100 mcg by mouth daily before breakfast.     zolpidem (AMBIEN) 5 MG tablet Take 5 mg by mouth at bedtime as needed for sleep.      Home: Home Living Family/patient expects to be discharged to:: Private residence Living Arrangements: Spouse/significant other Available Help at Discharge: Family, Available 24 hours/day Type of Home: House Home Access: Stairs to enter [employer] of Steps: 3 Entrance Stairs-Rails: Can reach both Home Layout: Two level, Full bath on main level, Able to live on main level with bedroom/bathroom Alternate Level Stairs-Number of Steps: full flight Alternate Level Stairs-Rails: Can reach both Bathroom Shower/Tub: Gaffer, Door [employer] Toilet: Standard Home Equipment: Environmental consultant - 2 wheels  Functional History: Prior Function Level of Independence: Needs assistance Gait / Transfers Assistance Needed: ambulating with RW ADL's / Homemaking Assistance Needed: requiring assist with donning/doffing brace and LB dressing as pt iwth LB weakness. Comments: Had been using RW for mobility.  Functional Status:  Mobility: Bed Mobility Overal bed mobility: Needs Assistance Bed Mobility: Rolling, Supine to Sit Rolling: Max assist Sidelying to sit: Max assist Supine to sit: Max assist, +2 for physical assistance, Total assist Sit to sidelying: Max assist General bed mobility comments: attempted log roll and "helicopter" techniques to get into sitting EOB however pt unable to tolerate due to pain and yelling out; total A for repositioning in bed; pt had breakfast in room and attempted to sit pt up in bed to eat however pt could not tolerate; RN notiifed for need of pain medication Transfers Overall transfer level: Needs assistance Equipment used: 1 person hand held assist Transfers: Sit to/from Stand Sit to Stand: Mod assist General transfer comment: unable due to pain Ambulation/Gait Ambulation/Gait  assistance: Min assist, Mod assist Gait Distance (Feet): 45 Feet Assistive device: Rolling walker (2 wheeled) Gait Pattern/deviations: Step-through pattern, Decreased step length - right, Decreased step length - left, Narrow base of support, Decreased dorsiflexion - left, Decreased weight shift to left General Gait Details: assistance required for balance and guiding RW; pt with poor bilat LE coordination and short step lengths/height; pt requires seated break and then attempted to ambulate back toward room however pt unable to take more than two steps and required assistance to sit safely back down in recliner Gait velocity: Decreased     ADL: ADL Overall ADL's : Needs assistance/impaired Eating/Feeding: Minimal assistance, Sitting Eating/Feeding Details (indicate cue type and reason): unable to successfully bring cup with straw to mouth Grooming: Minimal assistance, Sitting, Cueing for safety, Cueing for sequencing Upper Body Bathing: Moderate assistance, Sitting Lower Body Bathing: Maximal assistance, Sitting/lateral leans, Sit to/from stand Lower Body Bathing Details (indicate cue type and reason): unable to process LB AE even as a review from earlier this week, Upper Body Dressing : Moderate assistance, Sitting Lower Body Dressing: Total assistance, Bed level Lower Body Dressing Details (indicate cue type  and reason): Pt required max A and cues to iniate donning sock, but unable to follow through with activity, and unable to sustain attention   Toilet Transfer: Moderate assistance Toileting- Clothing Manipulation and Hygiene: Total assistance Functional mobility during ADLs: Moderate assistance General ADL Comments: Education on the importance of moving. Pt grimmacing in pain with BLE movement and unable to bear weight on LLE in standing. Pt more lethargic and not following all commands. Pt requiring increased assist today no standing task achieved.  Cognition: Cognition Overall  Cognitive Status: Impaired/Different from baseline Orientation Level: Oriented X4 Cognition Arousal/Alertness: Awake/alert Behavior During Therapy: Restless, Impulsive, Anxious Overall Cognitive Status: Impaired/Different from baseline Area of Impairment: Memory, Following commands, Safety/judgement, Problem solving Orientation Level: Disoriented to, Place, Time, Situation Current Attention Level: Focused Memory: Decreased recall of precautions, Decreased short-term memory Following Commands: Follows one step commands inconsistently, Follows one step commands with increased time Safety/Judgement: Decreased awareness of safety, Decreased awareness of deficits Awareness: Emergent Problem Solving: Decreased initiation, Difficulty sequencing, Requires verbal cues, Requires tactile cues General Comments: Pt very confused.  She thinks she is at home, but when she was prompted to look around, she was able to briefly recognize she was at hospital, but that was fleeting.   She escalates quickly, pushing therapist away and insisting on being left alone    Blood pressure (!) 143/78, pulse 96, temperature 97.8 F (36.6 C), temperature source Oral, resp. rate 18, height 5' (1.524 m), weight 56.1 kg, SpO2 96 %. Physical Exam  Nursing note and vitals reviewed. Constitutional: She appears well-developed and well-nourished.  Lying in bed. Confused  HENT:  Head: Normocephalic and atraumatic.  Eyes: EOM are normal. Right eye exhibits no discharge. Left eye exhibits no discharge.  Respiratory: Effort normal. No respiratory distress.  GI: She exhibits no distension.  Genitourinary:    Genitourinary Comments: Incontinent of bladder.   Musculoskeletal:        General: No tenderness or edema.     Comments: No edema or tenderness in extremities  Neurological: She is alert.  Able to recall place but unable to recall reason for admission.  Has no insight or awareness of current deficits.  High levels of  anxiety with pain LLE >RLE with minimal movements.   Generalized tremors noted Motor: Bilateral upper extremities: Grossly 4+/5 proximal distal Bilateral lower extremities: Grossly hip flexion, knee extension 2/5, ankle dorsiflexion 4/5  Skin:  Well healed old scar left shin.   Psychiatric: She has a normal mood and affect. She is slowed. Cognition and memory are impaired.    No results found for this or any previous visit (from the past 24 hour(s)). Ct Head Wo Contrast  Result Date: 03/29/2019 CLINICAL DATA:  78 year old female found down by nurse. Posterior head laceration. EXAM: CT HEAD WITHOUT CONTRAST CT CERVICAL SPINE WITHOUT CONTRAST TECHNIQUE: Multidetector CT imaging of the head and cervical spine was performed following the standard protocol without intravenous contrast. Multiplanar CT image reconstructions of the cervical spine were also generated. COMPARISON:  Brain MRI 10/28/2018 FINDINGS: CT HEAD FINDINGS Brain: Stable cerebral volume. Confluent bilateral cerebral white matter hypodensity appears stable to the February MRI. Stable ventricle size and configuration. No acute intracranial hemorrhage identified. No midline shift, mass effect, or evidence of intracranial mass lesion. No cortically based acute infarct identified. Vascular: Calcified atherosclerosis at the skull base. No suspicious intracranial vascular hyperdensity. Skull: No acute osseous abnormality identified. Sinuses/Orbits: Paranasal sinuses, tympanic cavities and mastoids are clear. There is a tiny left posterior  ethmoid osteoma (normal variant). Other: There is a mild right posterior convexity scalp contusion with trace soft tissue gas on series 4, image 47. Underlying calvarium intact. Visualized orbit soft tissues are within normal limits. CT CERVICAL SPINE FINDINGS Alignment: Preserved lower cervical lordosis, straightening of upper cervical lordosis. Cervicothoracic junction alignment is within normal limits. Bilateral  posterior element alignment is within normal limits. Skull base and vertebrae: Visualized skull base is intact. No atlanto-occipital dissociation. Benign appearing sclerosis of the right C3 lamina, probably benign bone island. - No acute osseous abnormality identified. Soft tissues and spinal canal: No prevertebral fluid or swelling. No visible canal hematoma. Calcified carotid atherosclerosis. Right IJ approach central line. Disc levels: Severe chronic disc and endplate degeneration at C5-C6 where there may be developing interbody ankylosis. Mild spinal stenosis suspected at that level. Upper chest: T4-T5 ankylosis and osteopenia in the visible upper thoracic spine. Visible upper thoracic levels appear intact. Calcified aortic atherosclerosis. 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 IMPRESSION: 1. Mild right posterior convexity scalp soft tissue injury without underlying fracture. 2. No acute intracranial abnormality. Advanced cerebral white matter disease appears stable since the February MRI. 3. No acute traumatic injury identified in the cervical spine. 4. Partially visible ectatic arch and Aortic Atherosclerosis (ICD10-I70.0). Electronically Signed   By: Genevie Ann M.D.   On: 03/29/2019 16:50   Ct Cervical Spine Wo Contrast  Result Date: 03/29/2019 CLINICAL DATA:  78 year old female found down by nurse. Posterior head laceration. EXAM: CT HEAD WITHOUT CONTRAST CT CERVICAL SPINE WITHOUT CONTRAST TECHNIQUE: Multidetector CT imaging of the head and cervical spine was performed following the standard protocol without intravenous contrast. Multiplanar CT image reconstructions of the cervical spine were also generated. COMPARISON:  Brain MRI 10/28/2018 FINDINGS: CT HEAD FINDINGS Brain: Stable cerebral volume. Confluent bilateral cerebral white matter hypodensity appears stable to the February MRI. Stable ventricle size and  configuration. No acute intracranial hemorrhage identified. No midline shift, mass effect, or evidence of intracranial mass lesion. No cortically based acute infarct identified. Vascular: Calcified atherosclerosis at the skull base. No suspicious intracranial vascular hyperdensity. Skull: No acute osseous abnormality identified. Sinuses/Orbits: Paranasal sinuses, tympanic cavities and mastoids are clear. There is a tiny left posterior ethmoid osteoma (normal variant). Other: There is a mild right posterior convexity scalp contusion with trace soft tissue gas on series 4, image 47. Underlying calvarium intact. Visualized orbit soft tissues are within normal limits. CT CERVICAL SPINE FINDINGS Alignment: Preserved lower cervical lordosis, straightening of upper cervical lordosis. Cervicothoracic junction alignment is within normal limits. Bilateral posterior element alignment is within normal limits. Skull base and vertebrae: Visualized skull base is intact. No atlanto-occipital dissociation. Benign appearing sclerosis of the right C3 lamina, probably benign bone island. - No acute osseous abnormality identified. Soft tissues and spinal canal: No prevertebral fluid or swelling. No visible canal hematoma. Calcified carotid atherosclerosis. Right IJ approach central line. Disc levels: Severe chronic disc and endplate degeneration at C5-C6 where there may be developing interbody ankylosis. Mild spinal stenosis suspected at that level. Upper chest: T4-T5 ankylosis and osteopenia in the visible upper thoracic spine. Visible upper thoracic levels appear intact. Calcified aortic atherosclerosis. 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 IMPRESSION: 1. Mild right posterior convexity scalp soft tissue injury without underlying fracture. 2. No acute intracranial abnormality. Advanced cerebral white matter disease appears stable since the  February MRI.  3. No acute traumatic injury identified in the cervical spine. 4. Partially visible ectatic arch and Aortic Atherosclerosis (ICD10-I70.0). Electronically Signed   By: Genevie Ann M.D.   On: 03/29/2019 16:50    Assessment/Plan: Diagnosis: Back and leg pain Labs and images (see above) independently reviewed.  Records reviewed and summated above.  1. Does the need for close, 24 hr/day medical supervision in concert with the patient's rehab needs make it unreasonable for this patient to be served in a less intensive setting? Potentially  2. Co-Morbidities requiring supervision/potential complications: anxiety (ensure anxiety and resulting apprehension do not limit functional progress; consider prn medications if warranted), depression s/p ECT (ensure mood does not hinder progress of therapies),,memory loss, chronic pain (Biofeedback training with therapies to help reduce reliance on opiate pain medications, monitor pain control during therapies, and sedation at rest and titrate to maximum efficacy to ensure participation and gains in therapies), lumbar spondylolisthesis with radiculopathy s/p decompressive laminectomy L4/5, HTN (monitor and provide prns in accordance with increased physical exertion and pain),?  CKD (avoid nephrotoxic meds), ABLA (repeat labs, consider transfusion if necessary to ensure appropriate perfusion for increased activity tolerance) 3. Due to bowel management, safety, skin/wound care, disease management, pain management and patient education, does the patient require 24 hr/day rehab nursing? Potentially 4. Does the patient require coordinated care of a physician, rehab nurse, PT (1-2 hrs/day, 5 days/week), OT (1-2 hrs/day, 5 days/week) and SLP (1-2 hrs/day, 5 days/week) to address physical and functional deficits in the context of the above medical diagnosis(es)? Potentially Addressing deficits in the following areas: balance, endurance, locomotion, strength, transferring,  bathing, dressing, toileting, cognition, language and psychosocial support 5. Can the patient actively participate in an intensive therapy program of at least 3 hrs of therapy per day at least 5 days per week? No 6. The potential for patient to make measurable gains while on inpatient rehab is good 7. Anticipated functional outcomes upon discharge from inpatient rehab are min assist  with PT, min assist with OT, supervision and min assist with SLP. 8. Estimated rehab length of stay to reach the above functional goals is: 14-18 days. 9. Anticipated D/C setting: Home 10. Anticipated post D/C treatments: HH therapy and Home excercise program 11. Overall Rehab/Functional Prognosis: good  RECOMMENDATIONS: This patient's condition is appropriate for continued rehabilitative care in the following setting: Pain significantly limiting factor at present.  Will also need to inquire about baseline level of functioning.  At present, patient does not have diagnosis amenable to CIR and family aware education.. Patient has agreed to participate in recommended program. Potentially Note that insurance prior authorization may be required for reimbursement for recommended care.  Comment: Rehab Admissions Coordinator to follow up.   I have personally performed a face to face diagnostic evaluation, including, but not limited to relevant history and physical exam findings, of this patient and developed relevant assessment and plan.  Additionally, I have reviewed and concur with the physician assistant's documentation above.   Delice Lesch, MD, ABPMR Bary Leriche, PA-C 03/31/2019

## 2019-03-31 NOTE — Progress Notes (Signed)
Physical Therapy Treatment Patient Details Name: Tiffany Gill MRN: [id number] DOB: September 26, 1940 Today's Date: 03/31/2019    History of Present Illness Pt is a 78 y/o female admitted secondary to worsening back pain and LE pain following recent PLIF. PMH includes anxiety, and HTN. Per notes, deciding if pt will require re-exploration of PLIF.     PT Comments    Patient seen for mobility progression. Upon arrival pt lying in supine and reports she would like to get OOB. Pt is oriented to place and time this session. Pt is limited by pain and yelling out with attempts of bed mobility. Pt tolerates bilat LE flexion exercises in supine however when attempting to come into sitting is unable due to report of L buttock/LE pain. End of session pt requires total A to reposition in bed and unable to tolerate HOB up to eat breakfast. RN notified and in to give pain medicine. PT will continue to follow acutely and progress as tolerated.    Follow Up Recommendations  CIR     Equipment Recommendations  Other (comment)(TBD)    Recommendations for Other Services       Precautions / Restrictions Precautions Precautions: Back;Fall Required Braces or Orthoses: Spinal Brace Spinal Brace: Lumbar corset;Applied in sitting position    Mobility  Bed Mobility Overal bed mobility: Needs Assistance Bed Mobility: Rolling;Supine to Sit Rolling: Max assist   Supine to sit: Max assist;+2 for physical assistance;Total assist     General bed mobility comments: attempted log roll and "helicopter" techniques to get into sitting EOB however pt unable to tolerate due to pain and yelling out; total A for repositioning in bed; pt had breakfast in room and attempted to sit pt up in bed to eat however pt could not tolerate; RN notiifed for need of pain medication  Transfers                 General transfer comment: unable due to pain  Ambulation/Gait                 Stairs              Wheelchair Mobility    Modified Rankin (Stroke Patients Only)       Balance                                            Cognition Arousal/Alertness: Awake/alert Behavior During Therapy: Restless;Impulsive;Anxious Overall Cognitive Status: Impaired/Different from baseline Area of Impairment: Memory;Following commands;Safety/judgement;Problem solving                     Memory: Decreased recall of precautions;Decreased short-term memory Following Commands: Follows one step commands inconsistently;Follows one step commands with increased time Safety/Judgement: Decreased awareness of safety;Decreased awareness of deficits   Problem Solving: Decreased initiation;Difficulty sequencing;Requires verbal cues;Requires tactile cues        Exercises General Exercises - Lower Extremity Heel Slides: AROM;AAROM;Both;Supine    General Comments        Pertinent Vitals/Pain Pain Assessment: Faces Faces Pain Scale: Hurts whole lot Pain Location: mainly L buttock/LE; screaming at times with bilat LE flexion Pain Descriptors / Indicators: Restless;Guarding;Grimacing;Cramping;Aching(yelling) Pain Intervention(s): Limited activity within patient's tolerance;Monitored during session;Repositioned;Patient requesting pain meds-RN notified;Utilized relaxation techniques    Home Living  Prior Function            PT Goals (current goals can now be found in the care plan section) Acute Rehab PT Goals Patient Stated Goal: to decrease pain  Progress towards PT goals: Not progressing toward goals - comment(limited by pain)    Frequency    Min 3X/week      PT Plan Current plan remains appropriate    Co-evaluation              AM-PAC PT "6 Clicks" Mobility   Outcome Measure  Help needed turning from your back to your side while in a flat bed without using bedrails?: Total Help needed moving from lying on your back to  sitting on the side of a flat bed without using bedrails?: Total Help needed moving to and from a bed to a chair (including a wheelchair)?: Total Help needed standing up from a chair using your arms (e.g., wheelchair or bedside chair)?: Total Help needed to walk in hospital room?: Total Help needed climbing 3-5 steps with a railing? : A Lot 6 Click Score: 7    End of Session   Activity Tolerance: Patient limited by pain Patient left: with call bell/phone within reach;in bed;with bed alarm set;with nursing/sitter in room Nurse Communication: Mobility status PT Visit Diagnosis: Unsteadiness on feet (R26.81);Muscle weakness (generalized) (M62.81);Pain Pain - part of body: (back)     Time: 4356-8616 PT Time Calculation (min) (ACUTE ONLY): 16 min  Charges:  $Therapeutic Activity: 8-22 mins                     Earney Navy, PTA Acute Rehabilitation Services Pager: [phone] Office: [phone]     Darliss Cheney 03/31/2019, 10:31 AM

## 2019-04-01 DIAGNOSIS — R410 Disorientation, unspecified: Secondary | ICD-10-CM

## 2019-04-01 DIAGNOSIS — M549 Dorsalgia, unspecified: Secondary | ICD-10-CM

## 2019-04-01 LAB — BASIC METABOLIC PANEL
Anion gap: 5 (ref 5–15)
BUN: 29 mg/dL — ABNORMAL HIGH (ref 8–23)
CO2: 23 mmol/L (ref 22–32)
Calcium: 8.1 mg/dL — ABNORMAL LOW (ref 8.9–10.3)
Chloride: 109 mmol/L (ref 98–111)
Creatinine, Ser: 0.99 mg/dL (ref 0.44–1.00)
GFR calc Af Amer: >60 mL/min (ref >60)
GFR calc non Af Amer: 55 mL/min — ABNORMAL LOW (ref >60)
Glucose, Bld: 126 mg/dL — ABNORMAL HIGH (ref 70–99)
Potassium: 4.2 mmol/L (ref 3.5–5.1)
Sodium: 137 mmol/L (ref 135–145)

## 2019-04-01 MED ORDER — SODIUM CHLORIDE 0.9 % IV SOLN
INTRAVENOUS | Status: DC
  Administered 2019-04-01 – 2019-04-02 (×3): via INTRAVENOUS

## 2019-04-01 MED ORDER — SODIUM CHLORIDE 0.9 % IV BOLUS
500.0000 mL | Freq: Once | INTRAVENOUS | Status: AC
  Administered 2019-04-01: 500 mL via INTRAVENOUS

## 2019-04-01 NOTE — Consult Note (Addendum)
Medical Consultation   Tiffany Gill  JQZ:[id number]  DOB: 10/28/1940  DOA: 03/24/2019  PCP: Hulan Fess, MD   Requesting physician: Emelda Brothers, MD  Reason for consultation: Delirium and bilateral gluteal pain  History of Present Illness: Tiffany Gill is an 78 y.o. female pmh of HTN, HLD, hypothyroidism, anxiety, depression, osteoarthritis; who presented with complaints of low back pain.  She had lumbar interbody fusion performed on 6/22 by Dr. Saintclair Halsted, but since that time had progressively worsening low back pain and bilateral buttock/hip pain. Readmitted for pain control and further work-up.  Pain reportedly has not improved with anti-inflammatory steroids or pain medication.  Since in the hospital she has become more altered.  Symptoms were thought to be multi-factorial in nature.  Medication such as dexamethasone, Flexeril, and gabapentin were discontinued and Lidoderm patches were used for pain control instead.     Review of Systems:   Review of Systems  Musculoskeletal: Positive for back pain and myalgias.   As per HPI otherwise 10 point review of systems negative.   Past Medical History: Past Medical History:  Diagnosis Date  . Anxiety   . Gait disorder   . High cholesterol   . Hypertension   . Hypothyroidism   . Major depression in partial remission ([place])   . Memory loss   . Osteoarthritis     Past Surgical History: Past Surgical History:  Procedure Laterality Date  . DECOMPRESSIVE LUMBAR LAMINECTOMY LEVEL 4  02/2019  . PARATHYROIDECTOMY       Allergies:   Allergies  Allergen Reactions  . Metronidazole Anaphylaxis and Other (See Comments)    Seizure      Social History:  reports that she has never smoked. She has never used smokeless tobacco. She reports current alcohol use of about 3.0 standard drinks of alcohol per week. She reports that she does not use drugs.   Family History: Family History  Problem Relation Age of  Onset  . Stroke Mother   . Heart disease Father   . Heart disease Brother       Physical Exam: Vitals:   03/31/19 1941 03/31/19 2324 04/01/19 0400 04/01/19 0814  BP: 130/79 113/75 (!) 158/84 (!) 138/92  Pulse: 88 87 83 82  Resp: 18 18 18 19   Temp: 98.1 F (36.7 C) 98.3 F (36.8 C) 97.8 F (36.6 C) 98.2 F (36.8 C)  TempSrc: Oral Oral Oral Oral  SpO2: 95%  96% 98%  Weight:      Height:        Constitutional: Elderly female NAD and able to follow commands  Eyes: PERLA, EOMI, irises appear normal, anicteric sclera,  ENMT: external ears and nose appear normal.  Dry mucous membranes. Neck: neck appears normal, no masses, normal ROM, no thyromegaly, no JVD  CVS: S1-S2 clear, no murmur rubs or gallops, no LE edema, normal pedal pulses  Respiratory:  clear to auscultation bilaterally, no wheezing, rales or rhonchi. Respiratory effort normal. No accessory muscle use.  Abdomen: soft nontender, nondistended, normal bowel sounds, no hepatosplenomegaly, no hernias  Musculoskeletal: Elevation of the bilateral leg seems to reproduce pain symptoms, but not noted on external rotation. Neuro: Cranial nerves II-XII intact, strength, sensation, reflexes Psych: judgement and insight appear normal, stable mood and affect, mental status Skin: no rashes or lesions or ulcers, no induration or nodules     Data reviewed:  I have personally reviewed following labs  and imaging studies Labs:  CBC: Recent Labs  Lab 03/28/19 1908  WBC 14.0*  NEUTROABS 13.2*  HGB 11.7*  HCT 34.5*  MCV 95.3  PLT 161    Basic Metabolic Panel: Recent Labs  Lab 03/28/19 1908 03/29/19 1512  NA 134* 135  K 4.3 4.3  CL 100 101  CO2 23 24  GLUCOSE 145* 99  BUN 38* 39*  CREATININE 1.18* 1.12*  CALCIUM 9.0 9.2   GFR Estimated Creatinine Clearance: 33 mL/min (A) (by C-G formula based on SCr of 1.12 mg/dL (H)). Liver Function Tests: Recent Labs  Lab 03/29/19 1512  AST 27  ALT 45*  ALKPHOS 82  BILITOT  0.7  PROT 5.8*  ALBUMIN 3.1*   No results for input(s): LIPASE, AMYLASE in the last 168 hours. No results for input(s): AMMONIA in the last 168 hours. Coagulation profile No results for input(s): INR, PROTIME in the last 168 hours.  Cardiac Enzymes: No results for input(s): CKTOTAL, CKMB, CKMBINDEX, TROPONINI in the last 168 hours. BNP: Invalid input(s): POCBNP CBG: No results for input(s): GLUCAP in the last 168 hours. D-Dimer No results for input(s): DDIMER in the last 72 hours. Hgb A1c No results for input(s): HGBA1C in the last 72 hours. Lipid Profile No results for input(s): CHOL, HDL, LDLCALC, TRIG, CHOLHDL, LDLDIRECT in the last 72 hours. Thyroid function studies No results for input(s): TSH, T4TOTAL, T3FREE, THYROIDAB in the last 72 hours.  Invalid input(s): FREET3 Anemia work up No results for input(s): VITAMINB12, FOLATE, FERRITIN, TIBC, IRON, RETICCTPCT in the last 72 hours. Urinalysis    Component Value Date/Time   COLORURINE YELLOW 03/29/2019 0355   APPEARANCEUR CLEAR 03/29/2019 0355   LABSPEC 1.015 03/29/2019 0355   PHURINE 7.0 03/29/2019 0355   GLUCOSEU NEGATIVE 03/29/2019 0355   HGBUR NEGATIVE 03/29/2019 0355   [place] NEGATIVE 03/29/2019 [place] 03/29/2019 0355   PROTEINUR NEGATIVE 03/29/2019 0355   NITRITE NEGATIVE 03/29/2019 0355   LEUKOCYTESUR NEGATIVE 03/29/2019 0355     Microbiology Recent Results (from the past 240 hour(s))  SARS Coronavirus 2 [facility] order, Performed in [facility] hospital lab)     Status: None   Collection Time: 03/24/19  4:30 PM  Result Value Ref Range Status   SARS Coronavirus 2 NEGATIVE NEGATIVE Final    Comment: (NOTE) If result is NEGATIVE SARS-CoV-2 target nucleic acids are NOT DETECTED. The SARS-CoV-2 RNA is generally detectable in upper and lower  respiratory specimens during the acute phase of infection. The lowest  concentration of SARS-CoV-2 viral copies this assay can detect is 250   copies / mL. A negative result does not preclude SARS-CoV-2 infection  and should not be used as the sole basis for treatment or other  patient management decisions.  A negative result may occur with  improper specimen collection / handling, submission of specimen other  than nasopharyngeal swab, presence of viral mutation(s) within the  areas targeted by this assay, and inadequate number of viral copies  (<250 copies / mL). A negative result must be combined with clinical  observations, patient history, and epidemiological information. If result is POSITIVE SARS-CoV-2 target nucleic acids are DETECTED. The SARS-CoV-2 RNA is generally detectable in upper and lower  respiratory specimens dur ing the acute phase of infection.  Positive  results are indicative of active infection with SARS-CoV-2.  Clinical  correlation with patient history and other diagnostic information is  necessary to determine patient infection status.  Positive results do  not rule out  bacterial infection or co-infection with other viruses. If result is PRESUMPTIVE POSTIVE SARS-CoV-2 nucleic acids MAY BE PRESENT.   A presumptive positive result was obtained on the submitted specimen  and confirmed on repeat testing.  While 2019 novel coronavirus  (SARS-CoV-2) nucleic acids may be present in the submitted sample  additional confirmatory testing may be necessary for epidemiological  and / or clinical management purposes  to differentiate between  SARS-CoV-2 and other Sarbecovirus currently known to infect humans.  If clinically indicated additional testing with an alternate test  methodology [phone]) is advised. The SARS-CoV-2 RNA is generally  detectable in upper and lower respiratory sp ecimens during the acute  phase of infection. The expected result is Negative. Fact Sheet for Patients:  StrictlyIdeas.no Fact Sheet for Healthcare Providers: BankingDealers.co.za  This test is not yet approved or cleared by the [place] FDA and has been authorized for detection and/or diagnosis of SARS-CoV-2 by FDA under an Emergency Use Authorization (EUA).  This EUA will remain in effect (meaning this test can be used) for the duration of the COVID-19 declaration under Section 564(b)(1) of the Act, 21 U.S.C. section 360bbb-3(b)(1), unless the authorization is terminated or revoked sooner. Performed at [facility], [place] [address] [place], [place] [address]        Inpatient Medications:   Scheduled Meds: . amLODipine  2.5 mg Oral Daily  . ARIPiprazole  2 mg Oral Daily  . buPROPion  300 mg Oral Daily  . cholecalciferol  1,000 Units Oral Daily  . DULoxetine  60 mg Oral Daily  . feeding supplement (ENSURE ENLIVE)  237 mL Oral BID BM  . heparin injection (subcutaneous)  5,000 Units Subcutaneous Q8H  . levothyroxine  100 mcg Oral QAC breakfast  . lidocaine  1 patch Transdermal Q24H  . losartan  50 mg Oral Daily  . pantoprazole  40 mg Oral QHS  . pravastatin  40 mg Oral QPM  . sodium chloride flush  10-40 mL Intracatheter Q12H  . sodium chloride flush  3 mL Intravenous Q12H   Continuous Infusions: . sodium chloride    . sodium chloride       Radiological Exams on Admission: No results found.  Impression/Recommendations Active Problems:   Spondylolisthesis at L4-L5 level   Back pain   Anxiety and depression   Chronic pain syndrome   Acute blood loss anemia   Stage 3 chronic kidney disease (HCC)  Acute delirium: Resolving.  Patient appears alert and oriented x3 at this time.  Review of labs from admission revealed elevated BUN at 39 with creatinine  1.12 given concern for dehydration.  Delirium appears to be multifactorial in nature in setting of high-dose steroids, muscle relaxant, pain medications, and/or dehydration.   -Agree with holding high-dose steroids and muscle relaxants -Recommending bolus 500 mL of normal saline IV fluids,  then placed on a rate of 100 mL/h overnight -Recheck BMP  -Patient  more alert and oriented x4 after IV fluids had been started  Low back/glutealpain: Patient reporting continued pain in the lower back gluteal region especially with movement of her legs.  On physical exam raising patient's legs up in the area reproduces symptoms.  Clear exact cause is at this time.  Repeat CT scan of the lumbar spine done on admission on 6/29 was negative for any signs of a fluid collection.  CT scan showed a left hip showed a related acetabular spurring no other acute abnormalities.  Question possibility of some form of nerve  impingement not visualized on CT scans. -May consider adding back on gabapentin  -May benefit from MRI    Thank you for this consultation.  Our [facility] hospitalist team will follow the patient with you.   Time Spent: 77min  Norval Morton M.D. [facility] 04/01/2019, 2:21 PM

## 2019-04-01 NOTE — Progress Notes (Signed)
Physical Therapy Treatment Patient Details Name: Tiffany Gill MRN: [id number] DOB: [birth year] Today's Date: 04/01/2019    History of Present Illness Pt is a 78 y/o female admitted secondary to worsening back pain and LE pain following recent PLIF. PMH includes anxiety, and HTN. Per notes, deciding if pt will require re-exploration of PLIF.     PT Comments    Patient seen for mobility progression. Pt is eager to participate in therapy however patient continues to be limited by L buttock/LE pain and is unable to tolerate sitting up in bed/EOB or rolling toward L side. Continue to progress as tolerated.     Follow Up Recommendations  CIR     Equipment Recommendations  Other (comment)(TBD)    Recommendations for Other Services       Precautions / Restrictions Precautions Precautions: Back;Fall Required Braces or Orthoses: Spinal Brace Spinal Brace: Lumbar corset;Applied in sitting position    Mobility  Bed Mobility Overal bed mobility: Needs Assistance Bed Mobility: Rolling;Supine to Sit Rolling: Max assist;Mod assist   Supine to sit: Total assist     General bed mobility comments: attempted to sit with total A to bring bilat LE/hips to EOB however pt unable to tolerate hip flexion past ~15-20 degrees to get into sitting; pt unable to roll toward L side due to pain and able to roll onto R side with assist at pelvis and supporting L LE with use of bed rail; total A +2 for repositioning in bed  Transfers                 General transfer comment: unable due to pain  Ambulation/Gait                 Stairs             Wheelchair Mobility    Modified Rankin (Stroke Patients Only)       Balance                                            Cognition Arousal/Alertness: Awake/alert Behavior During Therapy: Restless;Anxious Overall Cognitive Status: Impaired/Different from baseline Area of Impairment: Following  commands;Safety/judgement;Problem solving;Memory                     Memory: Decreased recall of precautions Following Commands: Follows one step commands inconsistently;Follows one step commands with increased time Safety/Judgement: Decreased awareness of safety;Decreased awareness of deficits   Problem Solving: Difficulty sequencing;Requires verbal cues;Requires tactile cues        Exercises Total Joint Exercises Bridges: AROM;5 reps;Supine;Limitations Bridges Limitations: pt unable to bring buttocks from bed  General Exercises - Lower Extremity Heel Slides: AROM;AAROM;Both;Supine    General Comments General comments (skin integrity, edema, etc.): husband present throughout session      Pertinent Vitals/Pain Pain Assessment: Faces Faces Pain Scale: Hurts whole lot Pain Location: mainly L buttock/LE Pain Descriptors / Indicators: Restless;Guarding;Grimacing;Cramping;Moaning Pain Intervention(s): Limited activity within patient's tolerance;Monitored during session;Premedicated before session;Repositioned;Utilized relaxation techniques    Home Living                      Prior Function            PT Goals (current goals can now be found in the care plan section) Acute Rehab PT Goals Patient Stated Goal: to decrease pain  Progress towards PT goals:  Progressing toward goals    Frequency    Min 3X/week      PT Plan Current plan remains appropriate    Co-evaluation              AM-PAC PT "6 Clicks" Mobility   Outcome Measure  Help needed turning from your back to your side while in a flat bed without using bedrails?: Total Help needed moving from lying on your back to sitting on the side of a flat bed without using bedrails?: Total Help needed moving to and from a bed to a chair (including a wheelchair)?: Total Help needed standing up from a chair using your arms (e.g., wheelchair or bedside chair)?: Total Help needed to walk in hospital  room?: Total Help needed climbing 3-5 steps with a railing? : Total 6 Click Score: 6    End of Session   Activity Tolerance: Patient limited by pain Patient left: with call bell/phone within reach;in bed;with bed alarm set;with family/visitor present Nurse Communication: Mobility status PT Visit Diagnosis: Unsteadiness on feet (R26.81);Muscle weakness (generalized) (M62.81);Pain Pain - part of body: (back)     Time: 4388-8757 PT Time Calculation (min) (ACUTE ONLY): 36 min  Charges:  $Therapeutic Exercise: 8-22 mins $Therapeutic Activity: 8-22 mins                     Earney Navy, PTA Acute Rehabilitation Services Pager: [phone] Office: [phone]     Darliss Cheney 04/01/2019, 4:16 PM

## 2019-04-01 NOTE — Progress Notes (Signed)
Neurosurgery Service Progress Note  Subjective: NAE ON, actually more oriented and in less pain this afternoon on my rounds  Objective: Vitals:   03/31/19 1941 03/31/19 2324 04/01/19 0400 04/01/19 0814  BP: 130/79 113/75 (!) 158/84 (!) 138/92  Pulse: 88 87 83 82  Resp: 18 18 18 19   Temp: 98.1 F (36.7 C) 98.3 F (36.8 C) 97.8 F (36.6 C) 98.2 F (36.8 C)  TempSrc: Oral Oral Oral Oral  SpO2: 95%  96% 98%  Weight:      Height:       Temp (24hrs), Avg:98 F (36.7 C), Min:97.8 F (36.6 C), Max:98.3 F (36.8 C)  CBC Latest Ref Rng & Units 03/28/2019 03/13/2019  WBC 4.0 - 10.5 K/uL 14.0(H) 7.6  Hemoglobin 12.0 - 15.0 g/dL 11.7(L) 15.8(H)  Hematocrit 36.0 - 46.0 % 34.5(L) 47.3(H)  Platelets 150 - 400 K/uL 336 288   BMP Latest Ref Rng & Units 03/29/2019 03/28/2019 03/13/2019  Glucose 70 - 99 mg/dL 99 145(H) 102(H)  BUN 8 - 23 mg/dL 39(H) 38(H) 11  Creatinine 0.44 - 1.00 mg/dL 1.12(H) 1.18(H) 1.07(H)  Sodium 135 - 145 mmol/L 135 134(L) 139  Potassium 3.5 - 5.1 mmol/L 4.3 4.3 3.6  Chloride 98 - 111 mmol/L 101 100 107  CO2 22 - 32 mmol/L 24 23 23   Calcium 8.9 - 10.3 mg/dL 9.2 9.0 9.9    Intake/Output Summary (Last 24 hours) at 04/01/2019 1420 Last data filed at 04/01/2019 0800 Gross per 24 hour  Intake 0 ml  Output -  Net 0 ml    Current Facility-Administered Medications:  .  0.9 %  sodium chloride infusion, 250 mL, Intravenous, Continuous, Kary Kos, MD .  acetaminophen (TYLENOL) tablet 650 mg, 650 mg, Oral, Q4H PRN, 650 mg at 04/01/19 0911 **OR** acetaminophen (TYLENOL) suppository 650 mg, 650 mg, Rectal, Q4H PRN, Kary Kos, MD .  alum & mag hydroxide-simeth (MAALOX/MYLANTA) 200-200-20 MG/5ML suspension 30 mL, 30 mL, Oral, Q6H PRN, Kary Kos, MD .  amLODipine (NORVASC) tablet 2.5 mg, 2.5 mg, Oral, Daily, Kary Kos, MD, 2.5 mg at 04/01/19 0911 .  ARIPiprazole (ABILIFY) tablet 2 mg, 2 mg, Oral, Daily, Kary Kos, MD, 2 mg at 04/01/19 0923 .  buPROPion (WELLBUTRIN XL) 24 hr  tablet 300 mg, 300 mg, Oral, Daily, Kary Kos, MD, 300 mg at 04/01/19 0911 .  cholecalciferol (VITAMIN D3) tablet 1,000 Units, 1,000 Units, Oral, Daily, Kary Kos, MD, 1,000 Units at 04/01/19 0911 .  clonazePAM (KLONOPIN) tablet 0.5 mg, 0.5 mg, Oral, TID PRN, Kary Kos, MD, 0.5 mg at 03/31/19 2200 .  DULoxetine (CYMBALTA) DR capsule 60 mg, 60 mg, Oral, Daily, Kary Kos, MD, 60 mg at 04/01/19 0911 .  feeding supplement (ENSURE ENLIVE) (ENSURE ENLIVE) liquid 237 mL, 237 mL, Oral, BID BM, Kary Kos, MD, 237 mL at 04/01/19 1322 .  heparin injection 5,000 Units, 5,000 Units, Subcutaneous, Q8H, Merlean Pizzini, Joyice Faster, MD, 5,000 Units at 04/01/19 1322 .  HYDROmorphone (DILAUDID) injection 0.5 mg, 0.5 mg, Intravenous, Q3H PRN, Kary Kos, MD, 0.5 mg at 03/30/19 1149 .  levothyroxine (SYNTHROID) tablet 100 mcg, 100 mcg, Oral, QAC breakfast, Kary Kos, MD, 100 mcg at 04/01/19 0609 .  lidocaine (LIDODERM) 5 % 1 patch, 1 patch, Transdermal, Q24H, Avion Kutzer, Joyice Faster, MD, 1 patch at 04/01/19 1149 .  losartan (COZAAR) tablet 50 mg, 50 mg, Oral, Daily, Kary Kos, MD, 50 mg at 04/01/19 0911 .  menthol-cetylpyridinium (CEPACOL) lozenge 3 mg, 1 lozenge, Oral, PRN **OR** phenol (CHLORASEPTIC) mouth spray  1 spray, 1 spray, Mouth/Throat, PRN, Kary Kos, MD .  ondansetron [facility]) tablet 4 mg, 4 mg, Oral, Q6H PRN **OR** ondansetron (ZOFRAN) injection 4 mg, 4 mg, Intravenous, Q6H PRN, Kary Kos, MD .  oxyCODONE (Oxy IR/ROXICODONE) immediate release tablet 5 mg, 5 mg, Oral, Q3H PRN, Kary Kos, MD, 5 mg at 04/01/19 1149 .  pantoprazole (PROTONIX) EC tablet 40 mg, 40 mg, Oral, QHS, Kary Kos, MD, 40 mg at 03/31/19 2201 .  polyethylene glycol (MIRALAX / GLYCOLAX) packet 17 g, 17 g, Oral, Daily PRN, Kary Kos, MD, 17 g at 03/26/19 2217 .  pravastatin (PRAVACHOL) tablet 40 mg, 40 mg, Oral, QPM, Kary Kos, MD, 40 mg at 03/31/19 1708 .  sodium chloride flush (NS) 0.9 % injection 10-40 mL, 10-40 mL, Intracatheter, Q12H,  Kary Kos, MD, 10 mL at 04/01/19 0911 .  sodium chloride flush (NS) 0.9 % injection 10-40 mL, 10-40 mL, Intracatheter, PRN, Kary Kos, MD .  sodium chloride flush (NS) 0.9 % injection 3 mL, 3 mL, Intravenous, Q12H, Kary Kos, MD, 3 mL at 03/30/19 2121 .  sodium chloride flush (NS) 0.9 % injection 3 mL, 3 mL, Intravenous, PRN, Kary Kos, MD   Physical Exam: AOx3, PERRL, EOMI, FS, Strength 5/5 x4, SILTx4 Incision c/d/i  Assessment & Plan: 78 y.o. woman s/p prior L4-5 PLIF then readmitted for low back pain, now with some confusion and hallucinations.  Neuro -keep holding dex -orientation waxing and waning. I do not see a clear cause, likely multifactorial. I think that we should try and get her out of the hospital to try and minimize this component of her delirium. PT recommending CIR, will c/s PM&R -d/c'd cyclobenzaprine and gabapentin -lidoderm patches for pain control and to minimize systemic pharm  Cardiopulm - no issues  FENGI - no issues  Heme/ID - no issues  Dispo/PPx -SCDs/TEDs/SQH -dispo pending diagnosis or resolution of her delirium / psychosis  -will c/s one of our hospitalists to see if there is another diagnosis that could explain her pain or any measures we can do to help clear her encephalopathy, as I do not see any further causes for either   I will update the husband on her plan of care after rounds  Judith Part  04/01/19 2:20 PM

## 2019-04-02 ENCOUNTER — Inpatient Hospital Stay (HOSPITAL_COMMUNITY): Payer: Medicare Other

## 2019-04-02 DIAGNOSIS — G9341 Metabolic encephalopathy: Secondary | ICD-10-CM

## 2019-04-02 LAB — TSH: TSH: 2.056 u[IU]/mL (ref 0.350–4.500)

## 2019-04-02 NOTE — Progress Notes (Signed)
PROGRESS NOTE    MARIPOSA SHORES  VPX:[id number] DOB: 02/26/1941 DOA: 03/24/2019 PCP: Hulan Fess, MD   Brief Narrative: Tiffany Gill is a 78 y.o. female with a history of hypertension, hyperlipidemia, hypothyroidism, anxiety, depression, osteoarthrtitis.   Assessment & Plan:   Active Problems:   Spondylolisthesis at L4-L5 level   Back pain   Anxiety and depression   Chronic pain syndrome   Acute blood loss anemia   Stage 3 chronic kidney disease (HCC)   Acute delirium Resolving per chart review. Oriented currently. Per discussion with husband, patient is near baseline. Gabapentin and tizanidine held. Patient also received IV fluids. -Continue to watch mental status  Low back/gluteal pain Significant pain. Unclear etiology. Pain not improved to date. MRI pending today. -Pain regimen per primary  Anxiety/depression -Continue Abilify, Wellbutrin, Cymbalta  CKD stage III Stable.  Hypothyroidism -Continue Synthroid   DVT prophylaxis: heparin Code Status:   Code Status: Full Code Family Communication: Husband on telephone (8 minute) Disposition Plan: Per primary   Antimicrobials:  None    Subjective: Back pain persistent.  Objective: Vitals:   04/02/19 0417 04/02/19 0746 04/02/19 1217 04/02/19 1623  BP: 136/72 (!) 173/80 (!) 158/80 (!) 162/89  Pulse: 84 86 84 86  Resp: 18 18 18 20   Temp: 97.7 F (36.5 C) 98.4 F (36.9 C) 98.1 F (36.7 C) 98.2 F (36.8 C)  TempSrc: Oral Oral Oral Oral  SpO2: 99% 94% 92% 97%  Weight:      Height:        Intake/Output Summary (Last 24 hours) at 04/02/2019 1636 Last data filed at 04/02/2019 0929 Gross per 24 hour  Intake 1456.21 ml  Output 750 ml  Net 706.21 ml   Filed Weights   03/24/19 1257  Weight: 56.1 kg    Examination:  General exam: Appears calm and comfortable Respiratory system: Clear to auscultation. Respiratory effort normal. Cardiovascular system: S1 & S2 heard, RRR. No murmurs, rubs,  gallops or clicks. Gastrointestinal system: Abdomen is nondistended, soft and nontender. No organomegaly or masses felt. Normal bowel sounds heard. Central nervous system: Alert and oriented. No focal neurological deficits. Extremities: No edema. No calf tenderness Skin: No cyanosis. No rashes Psychiatry: Judgement and insight appear normal. Mood & affect appropriate.     Data Reviewed: I have personally reviewed following labs and imaging studies  CBC: Recent Labs  Lab 03/28/19 1908  WBC 14.0*  NEUTROABS 13.2*  HGB 11.7*  HCT 34.5*  MCV 95.3  PLT 462   Basic Metabolic Panel: Recent Labs  Lab 03/28/19 1908 03/29/19 1512 04/01/19 1910  NA 134* 135 137  K 4.3 4.3 4.2  CL 100 101 109  CO2 23 24 23   GLUCOSE 145* 99 126*  BUN 38* 39* 29*  CREATININE 1.18* 1.12* 0.99  CALCIUM 9.0 9.2 8.1*   GFR: Estimated Creatinine Clearance: 37.3 mL/min (by C-G formula based on SCr of 0.99 mg/dL). Liver Function Tests: Recent Labs  Lab 03/29/19 1512  AST 27  ALT 45*  ALKPHOS 82  BILITOT 0.7  PROT 5.8*  ALBUMIN 3.1*   No results for input(s): LIPASE, AMYLASE in the last 168 hours. No results for input(s): AMMONIA in the last 168 hours. Coagulation Profile: No results for input(s): INR, PROTIME in the last 168 hours. Cardiac Enzymes: No results for input(s): CKTOTAL, CKMB, CKMBINDEX, TROPONINI in the last 168 hours. BNP (last 3 results) No results for input(s): PROBNP in the last 8760 hours. HbA1C: No results for input(s): HGBA1C in  the last 72 hours. CBG: No results for input(s): GLUCAP in the last 168 hours. Lipid Profile: No results for input(s): CHOL, HDL, LDLCALC, TRIG, CHOLHDL, LDLDIRECT in the last 72 hours. Thyroid Function Tests: Recent Labs    04/02/19 1240  TSH 2.056   Anemia Panel: No results for input(s): VITAMINB12, FOLATE, FERRITIN, TIBC, IRON, RETICCTPCT in the last 72 hours. Sepsis Labs: No results for input(s): PROCALCITON, LATICACIDVEN in the last  168 hours.  Recent Results (from the past 240 hour(s))  SARS Coronavirus 2 [facility] order, Performed in [facility] hospital lab)     Status: None   Collection Time: 03/24/19  4:30 PM  Result Value Ref Range Status   SARS Coronavirus 2 NEGATIVE NEGATIVE Final    Comment: (NOTE) If result is NEGATIVE SARS-CoV-2 target nucleic acids are NOT DETECTED. The SARS-CoV-2 RNA is generally detectable in upper and lower  respiratory specimens during the acute phase of infection. The lowest  concentration of SARS-CoV-2 viral copies this assay can detect is 250  copies / mL. A negative result does not preclude SARS-CoV-2 infection  and should not be used as the sole basis for treatment or other  patient management decisions.  A negative result may occur with  improper specimen collection / handling, submission of specimen other  than nasopharyngeal swab, presence of viral mutation(s) within the  areas targeted by this assay, and inadequate number of viral copies  (<250 copies / mL). A negative result must be combined with clinical  observations, patient history, and epidemiological information. If result is POSITIVE SARS-CoV-2 target nucleic acids are DETECTED. The SARS-CoV-2 RNA is generally detectable in upper and lower  respiratory specimens dur ing the acute phase of infection.  Positive  results are indicative of active infection with SARS-CoV-2.  Clinical  correlation with patient history and other diagnostic information is  necessary to determine patient infection status.  Positive results do  not rule out bacterial infection or co-infection with other viruses. If result is PRESUMPTIVE POSTIVE SARS-CoV-2 nucleic acids MAY BE PRESENT.   A presumptive positive result was obtained on the submitted specimen  and confirmed on repeat testing.  While 2019 novel coronavirus  (SARS-CoV-2) nucleic acids may be present in the submitted sample  additional confirmatory testing may be necessary for  epidemiological  and / or clinical management purposes  to differentiate between  SARS-CoV-2 and other Sarbecovirus currently known to infect humans.  If clinically indicated additional testing with an alternate test  methodology [phone]) is advised. The SARS-CoV-2 RNA is generally  detectable in upper and lower respiratory sp ecimens during the acute  phase of infection. The expected result is Negative. Fact Sheet for Patients:  StrictlyIdeas.no Fact Sheet for Healthcare Providers: BankingDealers.co.za This test is not yet approved or cleared by the [place] FDA and has been authorized for detection and/or diagnosis of SARS-CoV-2 by FDA under an Emergency Use Authorization (EUA).  This EUA will remain in effect (meaning this test can be used) for the duration of the COVID-19 declaration under Section 564(b)(1) of the Act, 21 U.S.C. section 360bbb-3(b)(1), unless the authorization is terminated or revoked sooner. Performed at [facility], [place] [address], [place], [place] [address]          Radiology Studies: Mr Lumbar Spine Wo Contrast  Result Date: 04/02/2019 CLINICAL DATA:  Severe back pain.  Recent fusion surgery. EXAM: MRI LUMBAR SPINE WITHOUT CONTRAST TECHNIQUE: Multiplanar, multisequence MR imaging of the lumbar spine was performed. No intravenous contrast  was administered. COMPARISON:  Lumbar spine CT 03/24/2019.  Lumbar MRI 10/28/2018 FINDINGS: Segmentation:  5 lumbar type vertebrae Alignment:  Fused grade 1 anterolisthesis at L4-5 Vertebrae: L4-5 PLIF with mild edematous signal in the adjacent endplates, likely accentuated by susceptibility artifact. There is cage subsidence into the L5 superior endplate which is known from prior CT. Grossly located pedicle screws. There is a dorsal intrathecal fluid collection with subdural appearance extending from L2 to L4, distorting the cauda equina without compression. Severe  thecal sac effacement at L4 and L5 due to a combination of dorsal epidural fluid (see marks on sagittal STIR imaging and sagittal T1 weighted imaging), epidural fat effacement, posterior element hypertrophy and slip. Cage subsidence continues to narrow the foramina, with possible residual disc material at the left foramen based on sagittal T2 weighted imaging. Remote T12 compression fracture. Conus medullaris and cauda equina: Conus extends to the L1-2 level. No conus edema Paraspinal and other soft tissues: Incisional fluid collection mid L3 to with thin internal septations spanning mid L5. No fluid collection deep to the deep fascia. Disc levels: Postoperative level as described above. The other levels are stable. IMPRESSION: 1. Recent L4-5 PLIF with known prominent cage subsidence into the L5 superior endplate. 2. Severe thecal sac effacement at L4-5 and L5 due to listhesis, hypertrophic posterior elements, epidural fat, and a small dorsal epidural collection at the level of L5. 3. Subdural collection from L2-L4 with displacement but no compression of the nerve roots. 4. Subcutaneous incisional fluid collection without adjacent inflammation. Electronically Signed   By: Monte Fantasia M.D.   On: 04/02/2019 12:48        Scheduled Meds: . amLODipine  2.5 mg Oral Daily  . ARIPiprazole  2 mg Oral Daily  . buPROPion  300 mg Oral Daily  . cholecalciferol  1,000 Units Oral Daily  . DULoxetine  60 mg Oral Daily  . feeding supplement (ENSURE ENLIVE)  237 mL Oral BID BM  . heparin injection (subcutaneous)  5,000 Units Subcutaneous Q8H  . levothyroxine  100 mcg Oral QAC breakfast  . lidocaine  1 patch Transdermal Q24H  . losartan  50 mg Oral Daily  . pantoprazole  40 mg Oral QHS  . pravastatin  40 mg Oral QPM  . sodium chloride flush  10-40 mL Intracatheter Q12H  . sodium chloride flush  3 mL Intravenous Q12H   Continuous Infusions: . sodium chloride       LOS: 9 days     Cordelia Poche, MD  [facility] 04/02/2019, 4:36 PM  If 7PM-7AM, please contact night-coverage [email or web address]

## 2019-04-02 NOTE — Progress Notes (Signed)
Pt off unit to CT then Xray  roport given to [place] and CT [employer].

## 2019-04-02 NOTE — Progress Notes (Signed)
Neurosurgery Service Progress Note  Subjective: NAE ON, no new complaints this morning. I saw her last night on evening rounds and again this morning, both times Ox3 but pain is still present with movement  Objective: Vitals:   04/01/19 2010 04/02/19 0001 04/02/19 0417 04/02/19 0746  BP: 128/79 (!) 112/54 136/72 (!) 173/80  Pulse: 80 85 84 86  Resp: 17 17 18 18   Temp: 98.2 F (36.8 C) 98.4 F (36.9 C) 97.7 F (36.5 C) 98.4 F (36.9 C)  TempSrc: Oral Oral Oral Oral  SpO2: 98% 97% 99% 94%  Weight:      Height:       Temp (24hrs), Avg:98.2 F (36.8 C), Min:97.7 F (36.5 C), Max:98.6 F (37 C)  CBC Latest Ref Rng & Units 03/28/2019 03/13/2019  WBC 4.0 - 10.5 K/uL 14.0(H) 7.6  Hemoglobin 12.0 - 15.0 g/dL 11.7(L) 15.8(H)  Hematocrit 36.0 - 46.0 % 34.5(L) 47.3(H)  Platelets 150 - 400 K/uL 336 288   BMP Latest Ref Rng & Units 04/01/2019 03/29/2019 03/28/2019  Glucose 70 - 99 mg/dL 126(H) 99 145(H)  BUN 8 - 23 mg/dL 29(H) 39(H) 38(H)  Creatinine 0.44 - 1.00 mg/dL 0.99 1.12(H) 1.18(H)  Sodium 135 - 145 mmol/L 137 135 134(L)  Potassium 3.5 - 5.1 mmol/L 4.2 4.3 4.3  Chloride 98 - 111 mmol/L 109 101 100  CO2 22 - 32 mmol/L 23 24 23   Calcium 8.9 - 10.3 mg/dL 8.1(L) 9.2 9.0    Intake/Output Summary (Last 24 hours) at 04/02/2019 0810 Last data filed at 04/02/2019 0600 Gross per 24 hour  Intake 2517.67 ml  Output 100 ml  Net 2417.67 ml    Current Facility-Administered Medications:  .  0.9 %  sodium chloride infusion, 250 mL, Intravenous, Continuous, Kary Kos, MD .  0.9 %  sodium chloride infusion, , Intravenous, Continuous, Smith, Rondell A, MD, Last Rate: 100 mL/hr at 04/02/19 0600 .  acetaminophen (TYLENOL) tablet 650 mg, 650 mg, Oral, Q4H PRN, 650 mg at 04/01/19 1641 **OR** acetaminophen (TYLENOL) suppository 650 mg, 650 mg, Rectal, Q4H PRN, Kary Kos, MD .  alum & mag hydroxide-simeth (MAALOX/MYLANTA) 200-200-20 MG/5ML suspension 30 mL, 30 mL, Oral, Q6H PRN, Kary Kos, MD .   amLODipine (NORVASC) tablet 2.5 mg, 2.5 mg, Oral, Daily, Kary Kos, MD, 2.5 mg at 04/01/19 0911 .  ARIPiprazole (ABILIFY) tablet 2 mg, 2 mg, Oral, Daily, Kary Kos, MD, 2 mg at 04/01/19 7564 .  buPROPion (WELLBUTRIN XL) 24 hr tablet 300 mg, 300 mg, Oral, Daily, Kary Kos, MD, 300 mg at 04/01/19 0911 .  cholecalciferol (VITAMIN D3) tablet 1,000 Units, 1,000 Units, Oral, Daily, Kary Kos, MD, 1,000 Units at 04/01/19 0911 .  clonazePAM (KLONOPIN) tablet 0.5 mg, 0.5 mg, Oral, TID PRN, Kary Kos, MD, 0.5 mg at 03/31/19 2200 .  DULoxetine (CYMBALTA) DR capsule 60 mg, 60 mg, Oral, Daily, Kary Kos, MD, 60 mg at 04/01/19 0911 .  feeding supplement (ENSURE ENLIVE) (ENSURE ENLIVE) liquid 237 mL, 237 mL, Oral, BID BM, Kary Kos, MD, 237 mL at 04/01/19 1322 .  heparin injection 5,000 Units, 5,000 Units, Subcutaneous, Q8H, Willis Kuipers, Joyice Faster, MD, 5,000 Units at 04/01/19 1322 .  HYDROmorphone (DILAUDID) injection 0.5 mg, 0.5 mg, Intravenous, Q3H PRN, Kary Kos, MD, 0.5 mg at 04/02/19 0528 .  levothyroxine (SYNTHROID) tablet 100 mcg, 100 mcg, Oral, QAC breakfast, Kary Kos, MD, 100 mcg at 04/02/19 0532 .  lidocaine (LIDODERM) 5 % 1 patch, 1 patch, Transdermal, Q24H, Dalyn Kjos, Joyice Faster, MD, 1 patch  at 04/01/19 1149 .  losartan (COZAAR) tablet 50 mg, 50 mg, Oral, Daily, Kary Kos, MD, 50 mg at 04/01/19 0911 .  menthol-cetylpyridinium (CEPACOL) lozenge 3 mg, 1 lozenge, Oral, PRN **OR** phenol (CHLORASEPTIC) mouth spray 1 spray, 1 spray, Mouth/Throat, PRN, Kary Kos, MD .  ondansetron (ZOFRAN) tablet 4 mg, 4 mg, Oral, Q6H PRN **OR** ondansetron (ZOFRAN) injection 4 mg, 4 mg, Intravenous, Q6H PRN, Kary Kos, MD .  oxyCODONE (Oxy IR/ROXICODONE) immediate release tablet 5 mg, 5 mg, Oral, Q3H PRN, Kary Kos, MD, 5 mg at 04/02/19 0125 .  pantoprazole (PROTONIX) EC tablet 40 mg, 40 mg, Oral, QHS, Kary Kos, MD, 40 mg at 03/31/19 2201 .  polyethylene glycol (MIRALAX / GLYCOLAX) packet 17 g, 17 g, Oral, Daily  PRN, Kary Kos, MD, 17 g at 03/26/19 2217 .  pravastatin (PRAVACHOL) tablet 40 mg, 40 mg, Oral, QPM, Kary Kos, MD, 40 mg at 04/01/19 1641 .  sodium chloride flush (NS) 0.9 % injection 10-40 mL, 10-40 mL, Intracatheter, Q12H, Kary Kos, MD, 10 mL at 04/01/19 2132 .  sodium chloride flush (NS) 0.9 % injection 10-40 mL, 10-40 mL, Intracatheter, PRN, Kary Kos, MD .  sodium chloride flush (NS) 0.9 % injection 3 mL, 3 mL, Intravenous, Q12H, Kary Kos, MD, 3 mL at 04/01/19 2133 .  sodium chloride flush (NS) 0.9 % injection 3 mL, 3 mL, Intravenous, PRN, Kary Kos, MD   Physical Exam: AOx3, PERRL, EOMI, FS, Strength 5/5 x4 but pain-limited in hip flexion, SILTx4 Incision c/d/i no drainage  Assessment & Plan: 78 y.o. woman s/p prior L4-5 PLIF then readmitted for low back pain, now with some confusion and hallucinations.  Neuro -delirium improving significantly, time course matches discontinuation of high dose steroids - likely culprit -agree with Dr. Tamala Julian, will repeat an MRI L-spine w/o  Cardiopulm - no issues  FENGI - stage 3 CKD -on IVF for dehydration, f/u medicine recs, rpt BMP  Heme/ID - no issues  Dispo/PPx -SCDs/TEDs/SQH -CIR pending -will repeat the MRI today just to make sure this isn't an atypical presentation of a new lumbar pathology. If it is negative, I think it is pretty convincing that there is not an obvious structural cause of this pain. In that case, I think that physiotherapy is likely the best thing for it, but will defer to PM&R.  I will update the husband on her plan of care today when possible.  Joyice Faster Jerilee Space  04/02/19 8:10 AM

## 2019-04-03 MED ORDER — AMLODIPINE BESYLATE 5 MG PO TABS
5.0000 mg | ORAL_TABLET | Freq: Every day | ORAL | Status: DC
  Administered 2019-04-04 – 2019-04-14 (×11): 5 mg via ORAL
  Filled 2019-04-03 (×11): qty 1

## 2019-04-03 MED ORDER — HYDROMORPHONE HCL 1 MG/ML IJ SOLN
0.5000 mg | INTRAMUSCULAR | Status: DC | PRN
  Administered 2019-04-05 – 2019-04-08 (×9): 0.5 mg via INTRAVENOUS
  Filled 2019-04-03 (×9): qty 0.5

## 2019-04-03 MED ORDER — OXYCODONE HCL 5 MG PO TABS: 5.0000 mg | ORAL_TABLET | ORAL | Status: DC | PRN

## 2019-04-03 MED ORDER — ACETAMINOPHEN 325 MG PO TABS
650.0000 mg | ORAL_TABLET | Freq: Four times a day (QID) | ORAL | Status: DC
  Administered 2019-04-03 – 2019-04-14 (×40): 650 mg via ORAL
  Filled 2019-04-03 (×41): qty 2

## 2019-04-03 MED ORDER — OXYCODONE HCL 5 MG PO TABS
10.0000 mg | ORAL_TABLET | ORAL | Status: DC | PRN
  Administered 2019-04-04 – 2019-04-07 (×15): 10 mg via ORAL
  Filled 2019-04-03 (×15): qty 2

## 2019-04-03 NOTE — Progress Notes (Signed)
PROGRESS NOTE    Tiffany Gill  OZH:[id number] DOB: May 11, 1941 DOA: 03/24/2019 PCP: Hulan Fess, MD   Brief Narrative: Tiffany Gill is a 78 y.o. female with a history of hypertension, hyperlipidemia, hypothyroidism, anxiety, depression, osteoarthrtitis.   Assessment & Plan:   Active Problems:   Spondylolisthesis at L4-L5 level   Back pain   Anxiety and depression   Chronic pain syndrome   Acute blood loss anemia   Stage 3 chronic kidney disease (HCC)   Acute delirium Resolving per chart review. Oriented currently. Per discussion with husband, patient is near baseline. Gabapentin and tizanidine held. Patient also received IV fluids. Steroids also discontinued which may have contributed or have been the main etiology. -Can consider resuming gabapentin slowly. Would not exceed a dose of 900 mg per day secondary to kidney function  Low back/gluteal pain Continued pain; significant. Could not get good exam today secondary to symptoms. Possibly related to recent surgery. Unfortunately, nothing more to add. -Pain regimen/management per primary  Anxiety/depression -Continue Abilify, Wellbutrin, Cymbalta  CKD stage III Stable.  Hypothyroidism -Continue Synthroid  Essential hypertension Elevated BP. On amlodipine and losartan. -Will increase amlodipine to 5 mg daily; continue losartan 50 mg  Recommendations: -restart gabapentin as needed with recommendations not to exceed 900 mg per day (2-3 divided doses) secondary to kidney function -amlodipine increased to 5 mg daily to better control blood pressure (up from 2.5 mg) and recommend continuing this at discharge -discontinue prednisone on discharge  Will sign off at this time. Please re-consult [facility] if needed.   DVT prophylaxis: heparin Code Status:   Code Status: Full Code Family Communication: None Disposition Plan: Per primary   Antimicrobials:  None    Subjective: Back pain  persistent.  Objective: Vitals:   04/02/19 1623 04/02/19 1929 04/03/19 0002 04/03/19 0724  BP: (!) 162/89 (!) 155/77 (!) 156/75 (!) 158/85  Pulse: 86 81 78 80  Resp: 20 16 17 17   Temp: 98.2 F (36.8 C) 98 F (36.7 C) 97.8 F (36.6 C) 98.3 F (36.8 C)  TempSrc: Oral Oral Oral Oral  SpO2: 97% 100% 100% 94%  Weight:      Height:        Intake/Output Summary (Last 24 hours) at 04/03/2019 1243 Last data filed at 04/03/2019 0726 Gross per 24 hour  Intake --  Output 700 ml  Net -700 ml   Filed Weights   03/24/19 1257  Weight: 56.1 kg    Examination:  General exam: Appears calm and comfortable Respiratory system: Clear to auscultation. Respiratory effort normal. Cardiovascular system: S1 & S2 heard, RRR. No murmurs, rubs, gallops or clicks. Gastrointestinal system: Abdomen is nondistended, soft and nontender. No organomegaly or masses felt. Normal bowel sounds heard. Central nervous system: Alert and oriented. No focal neurological deficits. Extremities: No edema. No calf tenderness Skin: No cyanosis. No rashes Psychiatry: Judgement and insight appear normal. Mood & affect appropriate.     Data Reviewed: I have personally reviewed following labs and imaging studies  CBC: Recent Labs  Lab 03/28/19 1908  WBC 14.0*  NEUTROABS 13.2*  HGB 11.7*  HCT 34.5*  MCV 95.3  PLT 629   Basic Metabolic Panel: Recent Labs  Lab 03/28/19 1908 03/29/19 1512 04/01/19 1910  NA 134* 135 137  K 4.3 4.3 4.2  CL 100 101 109  CO2 23 24 23   GLUCOSE 145* 99 126*  BUN 38* 39* 29*  CREATININE 1.18* 1.12* 0.99  CALCIUM 9.0 9.2 8.1*   GFR: Estimated  Creatinine Clearance: 37.3 mL/min (by C-G formula based on SCr of 0.99 mg/dL). Liver Function Tests: Recent Labs  Lab 03/29/19 1512  AST 27  ALT 45*  ALKPHOS 82  BILITOT 0.7  PROT 5.8*  ALBUMIN 3.1*   No results for input(s): LIPASE, AMYLASE in the last 168 hours. No results for input(s): AMMONIA in the last 168  hours. Coagulation Profile: No results for input(s): INR, PROTIME in the last 168 hours. Cardiac Enzymes: No results for input(s): CKTOTAL, CKMB, CKMBINDEX, TROPONINI in the last 168 hours. BNP (last 3 results) No results for input(s): PROBNP in the last 8760 hours. HbA1C: No results for input(s): HGBA1C in the last 72 hours. CBG: No results for input(s): GLUCAP in the last 168 hours. Lipid Profile: No results for input(s): CHOL, HDL, LDLCALC, TRIG, CHOLHDL, LDLDIRECT in the last 72 hours. Thyroid Function Tests: Recent Labs    04/02/19 1240  TSH 2.056   Anemia Panel: No results for input(s): VITAMINB12, FOLATE, FERRITIN, TIBC, IRON, RETICCTPCT in the last 72 hours. Sepsis Labs: No results for input(s): PROCALCITON, LATICACIDVEN in the last 168 hours.  Recent Results (from the past 240 hour(s))  SARS Coronavirus 2 [facility] order, Performed in [facility] hospital lab)     Status: None   Collection Time: 03/24/19  4:30 PM  Result Value Ref Range Status   SARS Coronavirus 2 NEGATIVE NEGATIVE Final    Comment: (NOTE) If result is NEGATIVE SARS-CoV-2 target nucleic acids are NOT DETECTED. The SARS-CoV-2 RNA is generally detectable in upper and lower  respiratory specimens during the acute phase of infection. The lowest  concentration of SARS-CoV-2 viral copies this assay can detect is 250  copies / mL. A negative result does not preclude SARS-CoV-2 infection  and should not be used as the sole basis for treatment or other  patient management decisions.  A negative result may occur with  improper specimen collection / handling, submission of specimen other  than nasopharyngeal swab, presence of viral mutation(s) within the  areas targeted by this assay, and inadequate number of viral copies  (<250 copies / mL). A negative result must be combined with clinical  observations, patient history, and epidemiological information. If result is POSITIVE SARS-CoV-2 target nucleic acids  are DETECTED. The SARS-CoV-2 RNA is generally detectable in upper and lower  respiratory specimens dur ing the acute phase of infection.  Positive  results are indicative of active infection with SARS-CoV-2.  Clinical  correlation with patient history and other diagnostic information is  necessary to determine patient infection status.  Positive results do  not rule out bacterial infection or co-infection with other viruses. If result is PRESUMPTIVE POSTIVE SARS-CoV-2 nucleic acids MAY BE PRESENT.   A presumptive positive result was obtained on the submitted specimen  and confirmed on repeat testing.  While 2019 novel coronavirus  (SARS-CoV-2) nucleic acids may be present in the submitted sample  additional confirmatory testing may be necessary for epidemiological  and / or clinical management purposes  to differentiate between  SARS-CoV-2 and other Sarbecovirus currently known to infect humans.  If clinically indicated additional testing with an alternate test  methodology [phone]) is advised. The SARS-CoV-2 RNA is generally  detectable in upper and lower respiratory sp ecimens during the acute  phase of infection. The expected result is Negative. Fact Sheet for Patients:  StrictlyIdeas.no Fact Sheet for Healthcare Providers: BankingDealers.co.za This test is not yet approved or cleared by the [place] FDA and has been authorized for detection and/or  diagnosis of SARS-CoV-2 by FDA under an Emergency Use Authorization (EUA).  This EUA will remain in effect (meaning this test can be used) for the duration of the COVID-19 declaration under Section 564(b)(1) of the Act, 21 U.S.C. section 360bbb-3(b)(1), unless the authorization is terminated or revoked sooner. Performed at [facility], [place] [address], [place], [place] [address]          Radiology Studies: Dg Sacrum/coccyx  Result Date: 04/02/2019 CLINICAL DATA:  Fall  with tailbone pain. EXAM: SACRUM AND COCCYX - 2+ VIEW COMPARISON:  Left hip radiograph 03/28/2019 FINDINGS: Cortical margins of the sacrum and coccyx are intact. No evidence of fracture. Sacroiliac joints are congruent. Postsurgical change at L4-L5, evaluated on lumbar spine MRI earlier today. IMPRESSION: No evidence of sacral or coccygeal fracture. Electronically Signed   By: Keith Rake M.D.   On: 04/02/2019 21:55   Ct Head Wo Contrast  Result Date: 04/02/2019 CLINICAL DATA:  78 year old female with lumbar spinal subdural hematoma, evaluate for new intracranial subdural hematoma. EXAM: CT HEAD WITHOUT CONTRAST TECHNIQUE: Contiguous axial images were obtained from the base of the skull through the vertex without intravenous contrast. COMPARISON:  Prior MRI from earlier the same day as well as previous CT from 03/29/2019. FINDINGS: Brain: Generalized age-related cerebral atrophy with chronic microvascular ischemic disease, stable. No acute intracranial hemorrhage. No new subdural hematoma or other extra-axial collection. No acute large vessel territory infarct. No mass lesion, midline shift or mass effect. Diffuse ventricular prominence related to global parenchymal volume loss without hydrocephalus. Vascular: No hyperdense vessel. Scattered vascular calcifications noted within the carotid siphons. Skull: Right occipital scalp laceration with skin staples in place. Calvarium intact. Sinuses/Orbits: Globes and orbital soft tissues demonstrate no acute finding. Paranasal sinuses are clear. Small chronic bilateral mastoid effusions noted, stable. Other: None. IMPRESSION: 1. No new subdural hematoma or other acute intracranial abnormality. 2. Right occipital scalp laceration with skin staples in place. 3. Age-related cerebral atrophy with advanced chronic microvascular ischemic disease, stable. Electronically Signed   By: Jeannine Boga M.D.   On: 04/02/2019 21:56   Mr Lumbar Spine Wo Contrast  Result  Date: 04/02/2019 CLINICAL DATA:  Severe back pain.  Recent fusion surgery. EXAM: MRI LUMBAR SPINE WITHOUT CONTRAST TECHNIQUE: Multiplanar, multisequence MR imaging of the lumbar spine was performed. No intravenous contrast was administered. COMPARISON:  Lumbar spine CT 03/24/2019.  Lumbar MRI 10/28/2018 FINDINGS: Segmentation:  5 lumbar type vertebrae Alignment:  Fused grade 1 anterolisthesis at L4-5 Vertebrae: L4-5 PLIF with mild edematous signal in the adjacent endplates, likely accentuated by susceptibility artifact. There is cage subsidence into the L5 superior endplate which is known from prior CT. Grossly located pedicle screws. There is a dorsal intrathecal fluid collection with subdural appearance extending from L2 to L4, distorting the cauda equina without compression. Severe thecal sac effacement at L4 and L5 due to a combination of dorsal epidural fluid (see marks on sagittal STIR imaging and sagittal T1 weighted imaging), epidural fat effacement, posterior element hypertrophy and slip. Cage subsidence continues to narrow the foramina, with possible residual disc material at the left foramen based on sagittal T2 weighted imaging. Remote T12 compression fracture. Conus medullaris and cauda equina: Conus extends to the L1-2 level. No conus edema Paraspinal and other soft tissues: Incisional fluid collection mid L3 to with thin internal septations spanning mid L5. No fluid collection deep to the deep fascia. Disc levels: Postoperative level as described above. The other levels are stable. IMPRESSION: 1. Recent L4-5  PLIF with known prominent cage subsidence into the L5 superior endplate. 2. Severe thecal sac effacement at L4-5 and L5 due to listhesis, hypertrophic posterior elements, epidural fat, and a small dorsal epidural collection at the level of L5. 3. Subdural collection from L2-L4 with displacement but no compression of the nerve roots. 4. Subcutaneous incisional fluid collection without adjacent  inflammation. Electronically Signed   By: Monte Fantasia M.D.   On: 04/02/2019 12:48        Scheduled Meds:  amLODipine  2.5 mg Oral Daily   ARIPiprazole  2 mg Oral Daily   buPROPion  300 mg Oral Daily   cholecalciferol  1,000 Units Oral Daily   DULoxetine  60 mg Oral Daily   feeding supplement (ENSURE ENLIVE)  237 mL Oral BID BM   heparin injection (subcutaneous)  5,000 Units Subcutaneous Q8H   levothyroxine  100 mcg Oral QAC breakfast   lidocaine  1 patch Transdermal Q24H   losartan  50 mg Oral Daily   pantoprazole  40 mg Oral QHS   pravastatin  40 mg Oral QPM   sodium chloride flush  10-40 mL Intracatheter Q12H   sodium chloride flush  3 mL Intravenous Q12H   Continuous Infusions:  sodium chloride       LOS: 10 days     Cordelia Poche, MD [facility] 04/03/2019, 12:43 PM  If 7PM-7AM, please contact night-coverage [email or web address]

## 2019-04-03 NOTE — Plan of Care (Signed)
  Problem: Clinical Measurements: Goal: Ability to maintain clinical measurements within normal limits will improve Outcome: Progressing   Problem: Activity: Goal: Risk for activity intolerance will decrease Outcome: Not Progressing   

## 2019-04-03 NOTE — Progress Notes (Signed)
CM met with the patient and her spouse about d/c planning. Spouse is not willing to discuss d/c plans until patient is having better pain control. TOC continuing to follow.

## 2019-04-03 NOTE — Progress Notes (Signed)
Occupational Therapy Treatment Patient Details Name: Tiffany Gill MRN: [id number] DOB: 09-30-1940 Today's Date: 04/03/2019    History of present illness Pt is a 78 y/o female admitted secondary to worsening back pain and LE pain following recent PLIF. PMH includes anxiety, and HTN. Per notes, deciding if pt will require re-exploration of PLIF.    OT comments  Pt progressing with bed mobility with maxA +2 for comfort. Pt requires cues for sequencing. Pt minA to modA for sit to stand x2 based on pt's anxiety level and motivation to stand and pivot to recliner. Pt modA +2 for initial stance and then pt asking "you want me to go to the recliner?" Pt stood up with minA and pivoted to recliner with +2. Pt seated up right and asked to tolerate sitting >1 hour. Pt's spouse in room. Pt progressing today. Pt would benefit from continued OT in transfers and ADL. OT following.   Pt appears highly anxious and when asked to refrain from screaming- pt did not scream throughout session. Pt is motivated at times and requires increased encouragement with 1 verbal cue at a time. Pt easily distracted and easily flustered. Pt reports pain, but OT unable to discern pain versus anxiety with such a high anxiety level.    Follow Up Recommendations  CIR(may have to think about SNF vs HH therapy for long term)    Equipment Recommendations  3 in 1 bedside commode    Recommendations for Other Services      Precautions / Restrictions Precautions Precautions: Back;Fall Precaution Comments: pt requires cues to maintain back precautions throughout Required Braces or Orthoses: Spinal Brace Spinal Brace: Lumbar corset;Applied in sitting position Restrictions Weight Bearing Restrictions: No       Mobility Bed Mobility Overal bed mobility: Needs Assistance Bed Mobility: Rolling;Sidelying to Sit Rolling: Min assist;+2 for physical assistance;+2 for safety/equipment Sidelying to sit: Max assist;+2 for physical  assistance;+2 for safety/equipment     Sit to sidelying: Max assist;+2 for physical assistance;+2 for safety/equipment General bed mobility comments: cues for sequecing/technique; assist required to maintain sidelying position as pt tends to twist when c/o pain; use of rail required  Transfers Overall transfer level: Needs assistance Equipment used: 2 person hand held assist Transfers: Sit to/from [facility] Sit to Stand: Mod assist;+2 physical assistance;+2 safety/equipment;Min assist Stand pivot transfers: Min assist;+2 physical assistance;+2 safety/equipment       General transfer comment: stood X 2 trials from EOB; initial stand pt required mod A +2 to power up into standing and with bilat LE "buckling" and posterior bias; pt c/o pain and returned to seated position EOB; second trial pt began standing without assistance and stood with min A (+2 for safety) and HHA +2 to pivot bed to recliner     Balance Overall balance assessment: Needs assistance Sitting-balance support: Bilateral upper extremity supported Sitting balance-Leahy Scale: Poor Sitting balance - Comments: intermittent assist required due to pain and tendency to lean backwards. Postural control: Posterior lean Standing balance support: Bilateral upper extremity supported;During functional activity Standing balance-Leahy Scale: Poor                             ADL either performed or assessed with clinical judgement   ADL Overall ADL's : Needs assistance/impaired Eating/Feeding: Minimal assistance Eating/Feeding Details (indicate cue type and reason): pt continues to require tactile cues to minA for hand eye coordination for drinking task.  Upper Body Dressing : Moderate assistance;Sitting Upper Body Dressing Details (indicate cue type and reason): Pt donning brace with  modA as pt highly anxious and unable to sit upright without posterior lean with BUEs donning brace.                  Functional mobility during ADLs: Moderate assistance;Rolling walker;Cueing for safety;Cueing for sequencing General ADL Comments: Education on the importance of movement to pt and spouse.     Vision   Vision Assessment?: No apparent visual deficits   Perception     Praxis      Cognition Arousal/Alertness: Awake/alert Behavior During Therapy: Restless;Anxious Overall Cognitive Status: Impaired/Different from baseline Area of Impairment: Following commands;Safety/judgement;Problem solving;Memory                       Following Commands: Follows one step commands inconsistently;Follows one step commands with increased time Safety/Judgement: Decreased awareness of safety;Decreased awareness of deficits   Problem Solving: Difficulty sequencing;Requires verbal cues;Requires tactile cues;Decreased initiation;Slow processing General Comments: Pt continues to have bouts of confusion. Pt easily excitable and anxious. Pt asked not to scream throughout session and pt did not scream.        Exercises     Shoulder Instructions       General Comments Pt's spouse in room    Pertinent Vitals/ Pain       Pain Assessment: Faces Faces Pain Scale: Hurts even more Pain Location: mainly L buttock/LE Pain Descriptors / Indicators: Restless;Guarding;Grimacing;Moaning;Other (Comment) Pain Intervention(s): Limited activity within patient's tolerance;Monitored during session;Premedicated before session  Home Living                                          Prior Functioning/Environment              Frequency  Min 2X/week        Progress Toward Goals  OT Goals(current goals can now be found in the care plan section)  Progress towards OT goals: Progressing toward goals  Acute Rehab OT Goals Patient Stated Goal: to decrease pain  OT Goal Formulation: With patient Time For Goal Achievement: 04/08/19 Potential to Achieve Goals:  Good ADL Goals Pt Will Perform Grooming: with modified independence;standing Pt Will Perform Lower Body Dressing: with modified independence;sitting/lateral leans;sit to/from stand Pt Will Transfer to Toilet: with supervision;ambulating;regular height toilet Pt Will Perform Toileting - Clothing Manipulation and hygiene: with supervision;sit to/from stand Additional ADL Goal #1: Pt will perform ADL with S level tasks in standing with fair balance.  Plan Discharge plan remains appropriate    Co-evaluation    PT/OT/SLP Co-Evaluation/Treatment: Yes Reason for Co-Treatment: Necessary to address cognition/behavior during functional activity PT goals addressed during session: Mobility/safety with mobility OT goals addressed during session: ADL's and self-care      AM-PAC OT "6 Clicks" Daily Activity     Outcome Measure   Help from another person eating meals?: A Lot Help from another person taking care of personal grooming?: Total Help from another person toileting, which includes using toliet, bedpan, or urinal?: A Lot Help from another person bathing (including washing, rinsing, drying)?: Total Help from another person to put on and taking off regular upper body clothing?: A Lot Help from another person to put on and taking off regular lower body clothing?: Total 6 Click Score: 9    End of Session Equipment  Utilized During Treatment: Gait belt;Rolling walker;Back brace  OT Visit Diagnosis: Unsteadiness on feet (R26.81);Muscle weakness (generalized) (M62.81);Pain Pain - part of body: (back)   Activity Tolerance Patient tolerated treatment well;Patient limited by pain   Patient Left in chair;with call bell/phone within reach;with chair alarm set   Nurse Communication Mobility status        Time: 4081-4481 OT Time Calculation (min): 32 min  Charges: OT General Charges $OT Visit: 1 Visit OT Treatments $Therapeutic Activity: 8-22 mins  Darryl Nestle) Marsa Aris OTR/L Acute  Rehabilitation Services Pager: [phone] Office: [place] 04/03/2019, 4:21 PM

## 2019-04-03 NOTE — Progress Notes (Signed)
Physical Therapy Treatment Patient Details Name: Tiffany Gill MRN: [id number] DOB: [birth year] Today's Date: 04/03/2019    History of Present Illness Pt is a 78 y/o female admitted secondary to worsening back pain and LE pain following recent PLIF. PMH includes anxiety, and HTN. Per notes, deciding if pt will require re-exploration of PLIF.     PT Comments    Patient seen for mobility progression.  Pt continues to present with anxiety and c/o pain with mobility. Pt is agreeable to participate therapy and is making gradual progress toward PT goals this session compared to past few PT sessions. Pt requires +2 assistance to get into sitting EOB from side lying position and then min/mod A +2 for safety with stand pivot transfer bed to recliner. Pt's husband present throughout. Continue to progress as tolerated.    Follow Up Recommendations  CIR     Equipment Recommendations  Other (comment)(TBD)    Recommendations for Other Services       Precautions / Restrictions Precautions Precautions: Back;Fall Precaution Comments: pt requires cues to maintain back precautions throughout Required Braces or Orthoses: Spinal Brace Spinal Brace: Lumbar corset;Applied in sitting position    Mobility  Bed Mobility Overal bed mobility: Needs Assistance Bed Mobility: Rolling;Sidelying to Sit Rolling: +2 for safety/equipment;Min assist;+2 for physical assistance Sidelying to sit: Max assist;+2 for physical assistance;+2 for safety/equipment       General bed mobility comments: cues for sequecing/technique; assist required to maintain sidelying position as pt tends to twist when c/o pain; assist needed to bring bilat LE/hips to EOB and to elevate trunk into sitting; use of rail to roll toward R side which pt initiated   Transfers Overall transfer level: Needs assistance Equipment used: 2 person hand held assist Transfers: Sit to/from [facility] Sit to Stand: Mod assist;+2  physical assistance;+2 safety/equipment;Min assist Stand pivot transfers: Min assist;+2 physical assistance;+2 safety/equipment       General transfer comment: stood X 2 trials from EOB; initial stand pt required mod A +2 to power up into standing and with bilat LE "buckling" and posterior bias; pt c/o pain and returned to seated position EOB; second trial pt began standing without assistance and stood with min A (+2 for safety) and HHA +2 to pivot bed to recliner   Ambulation/Gait                 Stairs             Wheelchair Mobility    Modified Rankin (Stroke Patients Only)       Balance Overall balance assessment: Needs assistance Sitting-balance support: Bilateral upper extremity supported Sitting balance-Leahy Scale: Poor Sitting balance - Comments: varying levels of assistance required; initially pt demonstrates posterior bias/extending trunk but progressed to min guard    Standing balance support: Bilateral upper extremity supported;During functional activity Standing balance-Leahy Scale: Poor                              Cognition Arousal/Alertness: Awake/alert Behavior During Therapy: Restless;Anxious Overall Cognitive Status: Impaired/Different from baseline Area of Impairment: Following commands;Safety/judgement;Problem solving;Memory                       Following Commands: Follows one step commands inconsistently;Follows one step commands with increased time Safety/Judgement: Decreased awareness of safety;Decreased awareness of deficits   Problem Solving: Difficulty sequencing;Requires verbal cues;Requires tactile cues;Decreased initiation;Slow processing General Comments: pt continues to  have confusion; pt asked to not scream when mobilizing and was able to control screaming during session      Exercises      General Comments General comments (skin integrity, edema, etc.): pt's husband present      Pertinent  Vitals/Pain Pain Assessment: Faces Faces Pain Scale: Hurts even more Pain Location: mainly L buttock/LE Pain Descriptors / Indicators: Restless;Guarding;Grimacing;Moaning;Other (Comment) Pain Intervention(s): Limited activity within patient's tolerance;Monitored during session;Repositioned;Premedicated before session;Utilized relaxation techniques    Home Living                      Prior Function            PT Goals (current goals can now be found in the care plan section) Acute Rehab PT Goals Patient Stated Goal: to decrease pain  Progress towards PT goals: Progressing toward goals    Frequency    Min 3X/week      PT Plan Current plan remains appropriate    Co-evaluation PT/OT/SLP Co-Evaluation/Treatment: Yes Reason for Co-Treatment: Necessary to address cognition/behavior during functional activity;For patient/therapist safety;To address functional/ADL transfers PT goals addressed during session: Mobility/safety with mobility        AM-PAC PT "6 Clicks" Mobility   Outcome Measure  Help needed turning from your back to your side while in a flat bed without using bedrails?: A Lot Help needed moving from lying on your back to sitting on the side of a flat bed without using bedrails?: A Lot Help needed moving to and from a bed to a chair (including a wheelchair)?: A Lot Help needed standing up from a chair using your arms (e.g., wheelchair or bedside chair)?: A Lot Help needed to walk in hospital room?: A Lot Help needed climbing 3-5 steps with a railing? : Total 6 Click Score: 11    End of Session Equipment Utilized During Treatment: Gait belt;Back brace Activity Tolerance: Patient limited by pain;Other (comment)(limited by anxiety) Patient left: with call bell/phone within reach;with family/visitor present;in chair;with chair alarm set Nurse Communication: Mobility status PT Visit Diagnosis: Unsteadiness on feet (R26.81);Muscle weakness (generalized)  (M62.81);Pain Pain - part of body: (back)     Time: 9179-1505 PT Time Calculation (min) (ACUTE ONLY): 32 min  Charges:  $Gait Training: 8-22 mins                     Earney Navy, PTA Acute Rehabilitation Services Pager: [phone] Office: [phone]     Darliss Cheney 04/03/2019, 11:27 AM

## 2019-04-03 NOTE — Progress Notes (Signed)
Physical Therapy Treatment Patient Details Name: Tiffany Gill MRN: [id number] DOB: 21-Dec-1940 Today's Date: 04/03/2019    History of Present Illness Pt is a 78 y/o female admitted secondary to worsening back pain and LE pain following recent PLIF. PMH includes anxiety, and HTN. Per notes, deciding if pt will require re-exploration of PLIF.     PT Comments    Patient seen second time for transfer training back to bed. Pt tolerated an hour OOB in chair however upon arrival was lying back in recliner. Pt requires +2 assist for safe transfer to bed and then for bed mobility while maintaining precautions. PT will continue to follow acutely and progress as tolerated.    Follow Up Recommendations  CIR     Equipment Recommendations  Other (comment)(TBD)    Recommendations for Other Services       Precautions / Restrictions Precautions Precautions: Back;Fall Precaution Comments: pt requires cues to maintain back precautions throughout Required Braces or Orthoses: Spinal Brace Spinal Brace: Lumbar corset;Applied in sitting position    Mobility  Bed Mobility Overal bed mobility: Needs Assistance Bed Mobility: Rolling;Sit to Sidelying Rolling: +2 for safety/equipment;Min assist;+2 for physical assistance       Sit to sidelying: Max assist;+2 for physical assistance;+2 for safety/equipment General bed mobility comments: cues for sequencing; pt resisting movements so difficult to get into side lying for "log roll" technique; assist to bring bilat LE into bed and guide trunk  Transfers Overall transfer level: Needs assistance Equipment used: 2 person hand held assist Transfers: Sit to/from Stand;Stand Pivot Transfers Sit to Stand: Mod assist;+2 physical assistance;+2 safety/equipment Stand pivot transfers: +2 physical assistance;+2 safety/equipment;Mod assist       General transfer comment: increased time and cueing for trunk flexion (pt lying back in recliner upon arrrival) and  to maintain trunk flexion in preparation to stand; assistance required to power up into standing and for balance in standing/for pivot to bed  Ambulation/Gait                 Stairs             Wheelchair Mobility    Modified Rankin (Stroke Patients Only)       Balance Overall balance assessment: Needs assistance Sitting-balance support: Bilateral upper extremity supported;Feet supported Sitting balance-Leahy Scale: Poor Sitting balance - Comments: posterior bias   Standing balance support: Bilateral upper extremity supported;During functional activity Standing balance-Leahy Scale: Poor                              Cognition Arousal/Alertness: Awake/alert Behavior During Therapy: Restless;Anxious Overall Cognitive Status: Impaired/Different from baseline Area of Impairment: Following commands;Safety/judgement;Problem solving;Memory                       Following Commands: Follows one step commands inconsistently;Follows one step commands with increased time Safety/Judgement: Decreased awareness of safety;Decreased awareness of deficits   Problem Solving: Difficulty sequencing;Requires verbal cues;Requires tactile cues;Decreased initiation;Slow processing        Exercises      General Comments General comments (skin integrity, edema, etc.): pt's husband present      Pertinent Vitals/Pain Pain Assessment: Faces Faces Pain Scale: Hurts even more Pain Location: unspecified Pain Descriptors / Indicators: Restless;Guarding;Grimacing;Moaning;Other (Comment) Pain Intervention(s): Limited activity within patient's tolerance;Monitored during session;Repositioned    Home Living  Prior Function            PT Goals (current goals can now be found in the care plan section) Acute Rehab PT Goals Patient Stated Goal: to decrease pain  Progress towards PT goals: Progressing toward goals    Frequency     Min 3X/week      PT Plan Current plan remains appropriate    Co-evaluation PT/OT/SLP Co-Evaluation/Treatment: Yes Reason for Co-Treatment: Necessary to address cognition/behavior during functional activity;For patient/therapist safety;To address functional/ADL transfers PT goals addressed during session: Mobility/safety with mobility        AM-PAC PT "6 Clicks" Mobility   Outcome Measure  Help needed turning from your back to your side while in a flat bed without using bedrails?: A Lot Help needed moving from lying on your back to sitting on the side of a flat bed without using bedrails?: A Lot Help needed moving to and from a bed to a chair (including a wheelchair)?: A Lot Help needed standing up from a chair using your arms (e.g., wheelchair or bedside chair)?: A Lot Help needed to walk in hospital room?: A Lot Help needed climbing 3-5 steps with a railing? : Total 6 Click Score: 11    End of Session Equipment Utilized During Treatment: Gait belt;Back brace Activity Tolerance: Patient limited by pain;Other (comment)(limited by anxiety) Patient left: with call bell/phone within reach;with family/visitor present;in bed;with bed alarm set Nurse Communication: Mobility status PT Visit Diagnosis: Unsteadiness on feet (R26.81);Muscle weakness (generalized) (M62.81);Pain Pain - part of body: (back)     Time: 9147-8295 PT Time Calculation (min) (ACUTE ONLY): 14 min  Charges:  $Gait Training: 8-22 mins                     Earney Navy, PTA Acute Rehabilitation Services Pager: [phone] Office: [phone]     Darliss Cheney 04/03/2019, 3:20 PM

## 2019-04-03 NOTE — Progress Notes (Signed)
Occupational Therapy Treatment Patient Details Name: Tiffany Gill MRN: [id number] DOB: 1941-05-25 Today's Date: 04/03/2019    History of present illness Pt is a 78 y/o female admitted secondary to worsening back pain and LE pain following recent PLIF. PMH includes anxiety, and HTN. Per notes, deciding if pt will require re-exploration of PLIF.    OT comments  Pt transferring from recliner to bed with minA to modA +2. Pt requiring the most assist for bed mobility as pt requiring stability to reduce posterior lean with positional change. Pt with no carry over from session to session. Pt with poor ability to carry over skills and thus it is difficult to note progress. Pt taking a few steps from recliner to bed today, but pt so anxious and reporting such severe pain (even after pain meds provided) that it is difficult to show progress. Pt motivated with gentle and frequent calm cues. Pt greatly benefiting from continued OT. OT following acutely to progress with ADL and safety.      Follow Up Recommendations  CIR(may have to think about SNF vs HH therapy for long term)    Equipment Recommendations  3 in 1 bedside commode    Recommendations for Other Services      Precautions / Restrictions Precautions Precautions: Back;Fall Precaution Comments: pt requires cues to maintain back precautions throughout Required Braces or Orthoses: Spinal Brace Spinal Brace: Lumbar corset;Applied in sitting position Restrictions Weight Bearing Restrictions: No       Mobility Bed Mobility Overal bed mobility: Needs Assistance Bed Mobility: Rolling;Sit to Sidelying Rolling: Min assist;+2 for physical assistance;+2 for safety/equipment Sidelying to sit: Max assist;+2 for physical assistance;+2 for safety/equipment     Sit to sidelying: Max assist;+2 for physical assistance;+2 for safety/equipment General bed mobility comments: cues for sequencing; pt resisting movements so difficult to get into side  lying for "log roll" technique; assist to bring bilat LE into bed and guide trunk  Transfers Overall transfer level: Needs assistance Equipment used: 2 person hand held assist Transfers: Sit to/from Stand;Stand Pivot Transfers Sit to Stand: Mod assist;+2 physical assistance;+2 safety/equipment Stand pivot transfers: +2 physical assistance;+2 safety/equipment;Mod assist       General transfer comment: Cueing for proper technique, for hand placement and for stability of using therapist instead of just reachning out.    Balance Overall balance assessment: Needs assistance Sitting-balance support: Bilateral upper extremity supported;Feet supported Sitting balance-Leahy Scale: Poor Sitting balance - Comments: posterior bias Postural control: Posterior lean Standing balance support: Bilateral upper extremity supported;During functional activity Standing balance-Leahy Scale: Poor                             ADL either performed or assessed with clinical judgement   ADL Overall ADL's : Needs assistance/impaired Eating/Feeding: Minimal assistance Eating/Feeding Details (indicate cue type and reason): pt continues to require tactile cues to minA for hand eye coordination for drinking task.  Grooming: Set up;Sitting Grooming Details (indicate cue type and reason): brushing hair; pt refusing to brush teeth at this time         Upper Body Dressing : Moderate assistance;Sitting Upper Body Dressing Details (indicate cue type and reason): Pt doffing brace with  modA and wanting to lean back.                 Functional mobility during ADLs: Moderate assistance;Rolling walker;Cueing for safety;Cueing for sequencing General ADL Comments: Education on the importance of movement to pt  and spouse.     Vision   Vision Assessment?: No apparent visual deficits   Perception     Praxis      Cognition Arousal/Alertness: Awake/alert Behavior During Therapy:  Restless;Anxious Overall Cognitive Status: Impaired/Different from baseline Area of Impairment: Following commands;Safety/judgement;Problem solving;Memory                       Following Commands: Follows one step commands inconsistently;Follows one step commands with increased time Safety/Judgement: Decreased awareness of safety;Decreased awareness of deficits   Problem Solving: Difficulty sequencing;Requires verbal cues;Requires tactile cues;Decreased initiation;Slow processing General Comments: Pt continues to have bouts of confusion. Pt easily excitable and anxious. Pt asked not to scream throughout session and pt did not scream.        Exercises     Shoulder Instructions       General Comments Spouse present    Pertinent Vitals/ Pain       Pain Assessment: Faces Faces Pain Scale: Hurts even more Pain Location: unspecified Pain Descriptors / Indicators: Restless;Guarding;Grimacing;Moaning;Other (Comment) Pain Intervention(s): Limited activity within patient's tolerance;Monitored during session;Premedicated before session;Repositioned  Home Living                                          Prior Functioning/Environment              Frequency  Min 2X/week        Progress Toward Goals  OT Goals(current goals can now be found in the care plan section)  Progress towards OT goals: Progressing toward goals  Acute Rehab OT Goals Patient Stated Goal: to decrease pain  OT Goal Formulation: With patient Time For Goal Achievement: 04/08/19 Potential to Achieve Goals: Good ADL Goals Pt Will Perform Grooming: with modified independence;standing Pt Will Perform Lower Body Dressing: with modified independence;sitting/lateral leans;sit to/from stand Pt Will Transfer to Toilet: with supervision;ambulating;regular height toilet Pt Will Perform Toileting - Clothing Manipulation and hygiene: with supervision;sit to/from stand Additional ADL Goal #1:  Pt will perform ADL with S level tasks in standing with fair balance.  Plan Discharge plan remains appropriate    Co-evaluation    PT/OT/SLP Co-Evaluation/Treatment: Yes Reason for Co-Treatment: Necessary to address cognition/behavior during functional activity PT goals addressed during session: Mobility/safety with mobility OT goals addressed during session: ADL's and self-care      AM-PAC OT "6 Clicks" Daily Activity     Outcome Measure   Help from another person eating meals?: A Lot Help from another person taking care of personal grooming?: Total Help from another person toileting, which includes using toliet, bedpan, or urinal?: A Lot Help from another person bathing (including washing, rinsing, drying)?: Total Help from another person to put on and taking off regular upper body clothing?: A Lot Help from another person to put on and taking off regular lower body clothing?: Total 6 Click Score: 9    End of Session Equipment Utilized During Treatment: Gait belt;Rolling walker;Back brace  OT Visit Diagnosis: Unsteadiness on feet (R26.81);Muscle weakness (generalized) (M62.81);Pain Pain - part of body: (back)   Activity Tolerance Patient tolerated treatment well;Patient limited by pain   Patient Left in bed;with bed alarm set;with call bell/phone within reach;with nursing/sitter in room   Nurse Communication Mobility status        Time: 8295-6213 OT Time Calculation (min): 23 min  Charges: OT General Charges $OT Visit:  1 Visit OT Treatments $Self Care/Home Management : 8-22 mins $Therapeutic Activity: 8-22 mins  Darryl Nestle) Marsa Aris OTR/L Acute Rehabilitation Services Pager: [phone] Office: [place] 04/03/2019, 4:30 PM

## 2019-04-03 NOTE — Progress Notes (Signed)
Neurosurgery Service Progress Note  Subjective: NAE ON, no new complaints this morning, mental status still very good, pain stable and significant  Objective: Vitals:   04/02/19 1623 04/02/19 1929 04/03/19 0002 04/03/19 0724  BP: (!) 162/89 (!) 155/77 (!) 156/75 (!) 158/85  Pulse: 86 81 78 80  Resp: 20 16 17 17   Temp: 98.2 F (36.8 C) 98 F (36.7 C) 97.8 F (36.6 C) 98.3 F (36.8 C)  TempSrc: Oral Oral Oral Oral  SpO2: 97% 100% 100% 94%  Weight:      Height:       Temp (24hrs), Avg:98.1 F (36.7 C), Min:97.8 F (36.6 C), Max:98.3 F (36.8 C)  CBC Latest Ref Rng & Units 03/28/2019 03/13/2019  WBC 4.0 - 10.5 K/uL 14.0(H) 7.6  Hemoglobin 12.0 - 15.0 g/dL 11.7(L) 15.8(H)  Hematocrit 36.0 - 46.0 % 34.5(L) 47.3(H)  Platelets 150 - 400 K/uL 336 288   BMP Latest Ref Rng & Units 04/01/2019 03/29/2019 03/28/2019  Glucose 70 - 99 mg/dL 126(H) 99 145(H)  BUN 8 - 23 mg/dL 29(H) 39(H) 38(H)  Creatinine 0.44 - 1.00 mg/dL 0.99 1.12(H) 1.18(H)  Sodium 135 - 145 mmol/L 137 135 134(L)  Potassium 3.5 - 5.1 mmol/L 4.2 4.3 4.3  Chloride 98 - 111 mmol/L 109 101 100  CO2 22 - 32 mmol/L 23 24 23   Calcium 8.9 - 10.3 mg/dL 8.1(L) 9.2 9.0    Intake/Output Summary (Last 24 hours) at 04/03/2019 0828 Last data filed at 04/03/2019 0726 Gross per 24 hour  Intake -  Output 1450 ml  Net -1450 ml    Current Facility-Administered Medications:  .  0.9 %  sodium chloride infusion, 250 mL, Intravenous, Continuous, Kary Kos, MD .  acetaminophen (TYLENOL) tablet 650 mg, 650 mg, Oral, Q4H PRN, 650 mg at 04/01/19 1641 **OR** acetaminophen (TYLENOL) suppository 650 mg, 650 mg, Rectal, Q4H PRN, Kary Kos, MD .  alum & mag hydroxide-simeth (MAALOX/MYLANTA) 200-200-20 MG/5ML suspension 30 mL, 30 mL, Oral, Q6H PRN, Kary Kos, MD .  amLODipine (NORVASC) tablet 2.5 mg, 2.5 mg, Oral, Daily, Kary Kos, MD, 2.5 mg at 04/02/19 2993 .  ARIPiprazole (ABILIFY) tablet 2 mg, 2 mg, Oral, Daily, Kary Kos, MD, 2 mg at 04/02/19  0930 .  buPROPion (WELLBUTRIN XL) 24 hr tablet 300 mg, 300 mg, Oral, Daily, Kary Kos, MD, 300 mg at 04/02/19 0928 .  cholecalciferol (VITAMIN D3) tablet 1,000 Units, 1,000 Units, Oral, Daily, Kary Kos, MD, 1,000 Units at 04/02/19 [phone] .  clonazePAM (KLONOPIN) tablet 0.5 mg, 0.5 mg, Oral, TID PRN, Kary Kos, MD, 0.5 mg at 04/02/19 2100 .  DULoxetine (CYMBALTA) DR capsule 60 mg, 60 mg, Oral, Daily, Kary Kos, MD, 60 mg at 04/02/19 0927 .  feeding supplement (ENSURE ENLIVE) (ENSURE ENLIVE) liquid 237 mL, 237 mL, Oral, BID BM, Kary Kos, MD, 237 mL at 04/02/19 1550 .  heparin injection 5,000 Units, 5,000 Units, Subcutaneous, Q8H, Judith Part, MD, 5,000 Units at 04/03/19 0539 .  HYDROmorphone (DILAUDID) injection 0.5 mg, 0.5 mg, Intravenous, Q3H PRN, Kary Kos, MD, 0.5 mg at 04/02/19 0528 .  levothyroxine (SYNTHROID) tablet 100 mcg, 100 mcg, Oral, QAC breakfast, Kary Kos, MD, 100 mcg at 04/03/19 0538 .  lidocaine (LIDODERM) 5 % 1 patch, 1 patch, Transdermal, Q24H, Terrall Bley, Joyice Faster, MD, 1 patch at 04/01/19 1149 .  losartan (COZAAR) tablet 50 mg, 50 mg, Oral, Daily, Kary Kos, MD, 50 mg at 04/02/19 6789 .  menthol-cetylpyridinium (CEPACOL) lozenge 3 mg, 1 lozenge, Oral, PRN **OR**  phenol (CHLORASEPTIC) mouth spray 1 spray, 1 spray, Mouth/Throat, PRN, Kary Kos, MD .  ondansetron (ZOFRAN) tablet 4 mg, 4 mg, Oral, Q6H PRN **OR** ondansetron (ZOFRAN) injection 4 mg, 4 mg, Intravenous, Q6H PRN, Kary Kos, MD .  oxyCODONE (Oxy IR/ROXICODONE) immediate release tablet 5 mg, 5 mg, Oral, Q3H PRN, Kary Kos, MD, 5 mg at 04/03/19 0553 .  pantoprazole (PROTONIX) EC tablet 40 mg, 40 mg, Oral, QHS, Kary Kos, MD, 40 mg at 04/02/19 2101 .  polyethylene glycol (MIRALAX / GLYCOLAX) packet 17 g, 17 g, Oral, Daily PRN, Kary Kos, MD, 17 g at 03/26/19 2217 .  pravastatin (PRAVACHOL) tablet 40 mg, 40 mg, Oral, QPM, Kary Kos, MD, 40 mg at 04/02/19 1814 .  sodium chloride flush (NS) 0.9 % injection  10-40 mL, 10-40 mL, Intracatheter, Q12H, Kary Kos, MD, 10 mL at 04/01/19 2132 .  sodium chloride flush (NS) 0.9 % injection 10-40 mL, 10-40 mL, Intracatheter, PRN, Kary Kos, MD, 10 mL at 04/02/19 2239 .  sodium chloride flush (NS) 0.9 % injection 3 mL, 3 mL, Intravenous, Q12H, Kary Kos, MD, 3 mL at 04/01/19 2133 .  sodium chloride flush (NS) 0.9 % injection 3 mL, 3 mL, Intravenous, PRN, Kary Kos, MD   Physical Exam: AOx3, PERRL, EOMI, FS, Strength 5/5 x4 but pain-limited in hip flexion, SILTx4 Incision c/d/i no drainage  Assessment & Plan: 78 y.o. woman s/p prior L4-5 PLIF then readmitted for low back pain, now with some confusion and hallucinations.  -f/u medicine recs -delirium improving significantly, time course matches discontinuation of high dose steroids - likely culprit, continues to improve -rpt MRI L-spine obtained, given hardware artifact and some motion degradation of images, I'll see if Dr. 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 They are usually asymptomatic, but they can be run-down from an intracranial SDH, so I repeated her CT head, which is negative for hemorrhage -XR sacrum negative -SCDs/TEDs/SQH  I will update the husband on her plan of care today when possible.  Joyice Faster Huriel Matt  04/03/19 8:28 AM

## 2019-04-04 NOTE — Progress Notes (Signed)
Neurosurgery Service Progress Note  Subjective: No acute events overnight, stable pain, mentation continues to be improved and she appears back to baseline  Objective: Vitals:   04/03/19 1935 04/03/19 2334 04/04/19 0349 04/04/19 0742  BP: 114/76 (!) 146/74 (!) 147/73 (!) 158/84  Pulse: 87 81 84 86  Resp: 17 17 17 16   Temp: 98.6 F (37 C) 97.7 F (36.5 C) 98 F (36.7 C) 97.6 F (36.4 C)  TempSrc: Oral Oral Oral Oral  SpO2: 99% 98% 98% (!) 83%  Weight:      Height:       Temp (24hrs), Avg:98 F (36.7 C), Min:97.6 F (36.4 C), Max:98.6 F (37 C)  CBC Latest Ref Rng & Units 03/28/2019 03/13/2019  WBC 4.0 - 10.5 K/uL 14.0(H) 7.6  Hemoglobin 12.0 - 15.0 g/dL 11.7(L) 15.8(H)  Hematocrit 36.0 - 46.0 % 34.5(L) 47.3(H)  Platelets 150 - 400 K/uL 336 288   BMP Latest Ref Rng & Units 04/01/2019 03/29/2019 03/28/2019  Glucose 70 - 99 mg/dL 126(H) 99 145(H)  BUN 8 - 23 mg/dL 29(H) 39(H) 38(H)  Creatinine 0.44 - 1.00 mg/dL 0.99 1.12(H) 1.18(H)  Sodium 135 - 145 mmol/L 137 135 134(L)  Potassium 3.5 - 5.1 mmol/L 4.2 4.3 4.3  Chloride 98 - 111 mmol/L 109 101 100  CO2 22 - 32 mmol/L 23 24 23   Calcium 8.9 - 10.3 mg/dL 8.1(L) 9.2 9.0    Intake/Output Summary (Last 24 hours) at 04/04/2019 0820 Last data filed at 04/04/2019 0005 Gross per 24 hour  Intake 110 ml  Output 600 ml  Net -490 ml    Current Facility-Administered Medications:  .  0.9 %  sodium chloride infusion, 250 mL, Intravenous, Continuous, Kary Kos, MD .  acetaminophen (TYLENOL) tablet 650 mg, 650 mg, Oral, Q6H WA, Judith Part, MD, 650 mg at 04/04/19 0228 .  alum & mag hydroxide-simeth (MAALOX/MYLANTA) 200-200-20 MG/5ML suspension 30 mL, 30 mL, Oral, Q6H PRN, Kary Kos, MD .  amLODipine (NORVASC) tablet 5 mg, 5 mg, Oral, Daily, Mariel Aloe, MD .  ARIPiprazole (ABILIFY) tablet 2 mg, 2 mg, Oral, Daily, Kary Kos, MD, 2 mg at 04/03/19 0856 .  buPROPion (WELLBUTRIN XL) 24 hr tablet 300 mg, 300 mg, Oral, Daily, Kary Kos, MD, 300 mg at 04/03/19 0856 .  cholecalciferol (VITAMIN D3) tablet 1,000 Units, 1,000 Units, Oral, Daily, Kary Kos, MD, 1,000 Units at 04/03/19 0857 .  clonazePAM (KLONOPIN) tablet 0.5 mg, 0.5 mg, Oral, TID PRN, Kary Kos, MD, 0.5 mg at 04/03/19 1129 .  DULoxetine (CYMBALTA) DR capsule 60 mg, 60 mg, Oral, Daily, Kary Kos, MD, 60 mg at 04/02/19 0927 .  feeding supplement (ENSURE ENLIVE) (ENSURE ENLIVE) liquid 237 mL, 237 mL, Oral, BID BM, Kary Kos, MD, 237 mL at 04/03/19 1500 .  heparin injection 5,000 Units, 5,000 Units, Subcutaneous, Q8H, Judith Part, MD, 5,000 Units at 04/03/19 0539 .  HYDROmorphone (DILAUDID) injection 0.5 mg, 0.5 mg, Intravenous, Q3H PRN, Judith Part, MD .  levothyroxine (SYNTHROID) tablet 100 mcg, 100 mcg, Oral, QAC breakfast, Kary Kos, MD, 100 mcg at 04/04/19 0506 .  lidocaine (LIDODERM) 5 % 1 patch, 1 patch, Transdermal, Q24H, , Joyice Faster, MD, 1 patch at 04/03/19 1225 .  losartan (COZAAR) tablet 50 mg, 50 mg, Oral, Daily, Kary Kos, MD, 50 mg at 04/03/19 0857 .  menthol-cetylpyridinium (CEPACOL) lozenge 3 mg, 1 lozenge, Oral, PRN **OR** phenol (CHLORASEPTIC) mouth spray 1 spray, 1 spray, Mouth/Throat, PRN, Kary Kos, MD .  ondansetron (  ZOFRAN) tablet 4 mg, 4 mg, Oral, Q6H PRN **OR** ondansetron (ZOFRAN) injection 4 mg, 4 mg, Intravenous, Q6H PRN, Kary Kos, MD .  oxyCODONE (Oxy IR/ROXICODONE) immediate release tablet 10 mg, 10 mg, Oral, Q4H PRN, Judith Part, MD, 10 mg at 04/04/19 0423 .  oxyCODONE (Oxy IR/ROXICODONE) immediate release tablet 5 mg, 5 mg, Oral, Q4H PRN, ,  A, MD .  pantoprazole (PROTONIX) EC tablet 40 mg, 40 mg, Oral, QHS, Kary Kos, MD, 40 mg at 04/03/19 2004 .  polyethylene glycol (MIRALAX / GLYCOLAX) packet 17 g, 17 g, Oral, Daily PRN, Kary Kos, MD, 17 g at 03/26/19 2217 .  pravastatin (PRAVACHOL) tablet 40 mg, 40 mg, Oral, QPM, Kary Kos, MD, 40 mg at 04/03/19 1728 .  sodium chloride flush  (NS) 0.9 % injection 10-40 mL, 10-40 mL, Intracatheter, Q12H, Kary Kos, MD, 10 mL at 04/04/19 0005 .  sodium chloride flush (NS) 0.9 % injection 10-40 mL, 10-40 mL, Intracatheter, PRN, Kary Kos, MD, 10 mL at 04/02/19 2239 .  sodium chloride flush (NS) 0.9 % injection 3 mL, 3 mL, Intravenous, Q12H, Kary Kos, MD, 3 mL at 04/01/19 2133 .  sodium chloride flush (NS) 0.9 % injection 3 mL, 3 mL, Intravenous, PRN, Kary Kos, MD   Physical Exam: AOx3, PERRL, EOMI, FS, Strength 5/5 x4 but pain-limited in hip flexion, SILTx4 Incision c/d/i no drainage  Assessment & Plan: 78 y.o. woman s/p prior L4-5 PLIF then readmitted for low back pain, high dose dex for a few days with then with some confusion and hallucinations. Dex d/c'd with return to baseline mentation. Fall on 7/4 with scalp laceration, CTH and CT C-spine neg. Workup of leg pain thus far: CT L-spine with some subsidence of the R PLIF graft at L4-5 but hardware intact. CT L hip no fracture, XR coccyx no frx, MRI L-spine had SDH, so rpt CTH obtained which was negative. MRI L-spine showed a spinal SDH, thecal sac compression at L4-5. Her pain is at the interface of her buttock and posterior thigh, very tender to palpation, not present at rest, only with flexion.   I discussed the below with the husband last night. I will call him again today when I am able to review things. He would like to review the imaging himself, he is an Chief Financial Officer and thinks he may have some insight to provide the care team. I told him that I am not sure logistically how that can be done (for non-medical personnel to access PACS), but if he has the patient's approval to review her records, I told him that I am certainly okay with this.  -delirium improving significantly, time course matches discontinuation of high dose steroids - likely culprit, continues to improve -now that her mentation has sustainably been back to baseline, I think this is an opportunity to increase her  pain medications and see how she does. I increased the oxy from 5mg  to 5-10mg , will see how her pain and mentation does on this increased dose -SCDs/TEDs/SQH  Judith Part  04/04/19 8:20 AM

## 2019-04-04 NOTE — Consult Note (Signed)
   [facility] CM Inpatient Consult   04/04/2019  Tiffany Gill 1941/07/22 [id number]    Chart reviewed for progression with [facility] (long length of stay), has 7 day and 30 day readmission (2 hospitalizations in past 6 months) and to check if potential [facility] services needed for this Medicare/ [employer] patient. Patient is currently being recommended for .   Per medical record review, reveals MD note on 03/31/09 which shows as: Tiffany Gill is an 78 y.o. female pmh of HTN, HLD, hypothyroidism, anxiety, depression, osteoarthritis;    who presented with complaints of low back pain. She had lumbar interbody fusion performed on 6/22 by Dr. Saintclair Halsted, but since that time had progressively worsening low back pain and bilateral buttock/hip pain.  Readmitted for pain control of  low back pain and further work-up.  Pain reportedly has not improved with anti-inflammatory steroids or pain medication.  Since in the hospital she has become more altered.  Symptoms were thought to be multi-factorial in nature.  Medication such as dexamethasone, Flexeril, and gabapentin were discontinued and Lidoderm patches were used for pain control instead.  (Spondylolisthesis at L4-L5 level, Back pain, Anxiety and depression, chronic pain syndrome,acute blood loss anemia Acute delirium)  Primary care provider is Dr. Hulan Fess with [facility], listed as providing transition of care follow-up.   PT/ OT notes reviewed and continue to recommend Inpatient Rehab (CIR).  Inpatient TOC CM note review shows discharge plan pending with better pain control per spouse.  If there are any changes in disposition and needs, please refer to [facility] care management for follow-up as appropriate.  Of note, [facility] Care Management services does not replace or interfere with any services that are arranged by transition of care case management or social work.   For questions, please contact:   Edwena Felty A. Cleotilde Spadaccini, BSN, RN-BC [facility] Liaison Cell: [phone]

## 2019-04-04 NOTE — Care Management Important Message (Signed)
Important Message  Patient Details  Name: Tiffany Gill MRN: [id number] Date of Birth: Mar 23, 1941   Medicare Important Message Given:  Yes     Orbie Pyo 04/04/2019, 3:38 PM

## 2019-04-05 NOTE — Progress Notes (Addendum)
  NEUROSURGERY PROGRESS NOTE   No issues overnight.  Drowsy this am and unable to perform exam. Informed by nurse patient was given klonopin and oxycodone this am for pain. Was awake, answering questions and following commands prior to med administration.  EXAM:  BP (!) 152/139 (BP Location: Left Arm)   Pulse 84   Temp 97.6 F (36.4 C) (Oral)   Resp 18   Ht 5' (1.524 m)   Wt 56.1 kg   SpO2 (!) 82%   BMI 24.15 kg/m   Drowsy Difficulties staying awake Does not follow commands  PLAN Drowsy this am. Unable to perform assessment. Will need to avoid klonopin and oxycodone co-administration. Will reassess later.  Addendum Patient reexamined 1 hour later. Resting comfortably but easily awakened. Reports continued improvement in pain. A/Ox3, PERRL, MAEW, nonfocal Would avoid co-admin of klonopin and oxy Continue current care PT/OT

## 2019-04-06 MED ORDER — METHOCARBAMOL 500 MG PO TABS
500.0000 mg | ORAL_TABLET | Freq: Four times a day (QID) | ORAL | Status: DC | PRN
  Administered 2019-04-06 – 2019-04-13 (×5): 500 mg via ORAL
  Filled 2019-04-06 (×5): qty 1

## 2019-04-06 NOTE — Progress Notes (Signed)
Occupational Therapy Treatment Patient Details Name: Tiffany Gill MRN: [id number] DOB: 04-Jul-1941 Today's Date: 04/06/2019    History of present illness Pt is a 78 y/o female admitted secondary to worsening back pain and LE pain following recent PLIF. PMH includes anxiety, and HTN. Per notes, deciding if pt will require re-exploration of PLIF.    OT comments  Pt. Seen for skilled therapy session with PT.  Focus of session was initiation of bed mobility in preparation for eob.  Pt. Continues to remain limited with c/o of severe pain/spasms R buttocks resulting in full body tremors.  Utilization of relaxation techniques and breathing strategies throughout session during spasms and tremors.  Pt. Unable to initiate b le knee bend in preparation for log roll. Attempted both L/R with le positioning modifications.  Total a x2 using pad to roll pt. Into L sidelying. Education to pt. And spouse to encourage small positional changes and increasing HOB as pt. Can tolerate (in conjunction with effective pain management)  in hopes to continue progression with mobility and adls.   Follow Up Recommendations  CIR    Equipment Recommendations  3 in 1 bedside commode    Recommendations for Other Services      Precautions / Restrictions Precautions Precautions: Back;Fall Precaution Comments: pt requires cues to maintain back precautions throughout Required Braces or Orthoses: Spinal Brace Spinal Brace: Lumbar corset;Applied in sitting position       Mobility Bed Mobility Overal bed mobility: Needs Assistance Bed Mobility: Rolling Rolling: Total assist;+2 for physical assistance         General bed mobility comments: pt. with c/o severe spasms/pain mostly in R glute.  resulting in tremors, yelling, moaning.  max verbal guidance with breathing strategies to regroup during spasms.  refer to PT notes on attempts of movement of Bles.  educated and attempted portions of log roll technique. pt. unable  to initiate without the spasm starting and full body tremors.  increased time in between each initiation of movement with breathing and relaxation techniques to relax.  after multiple attempts pt/ot utilized pads to roll pt. into L sidelying. pt. reported instant relief. pillow placed lower back with blanket between knees. pt. able to roll slightly back into pillow and states she feels so relaxed and comfortable.  educated pt. and spouse on need to slowly increase head of bed every 30 min. or so to increase pts. tolerance for upright posture and also pressure relief.  all of this in preparation for goal of sidelying to sit then progress from there.  Transfers                      Balance                                           ADL either performed or assessed with clinical judgement   ADL                                               Vision       Perception     Praxis      Cognition Arousal/Alertness: Awake/alert Behavior During Therapy: Anxious  Exercises     Shoulder Instructions       General Comments      Pertinent Vitals/ Pain       Pain Assessment: Faces Faces Pain Scale: Hurts worst Pain Location: mostly R gluteus Pain Descriptors / Indicators: Sharp;Shooting;Spasm Pain Intervention(s): Limited activity within patient's tolerance;Monitored during session;Repositioned;RN gave pain meds during session;Relaxation;Utilized relaxation techniques  Home Living                                          Prior Functioning/Environment              Frequency  Min 2X/week        Progress Toward Goals  OT Goals(current goals can now be found in the care plan section)  Progress towards OT goals: Progressing toward goals     Plan Discharge plan remains appropriate    Co-evaluation    PT/OT/SLP Co-Evaluation/Treatment: Yes Reason for  Co-Treatment: For patient/therapist safety;To address functional/ADL transfers   OT goals addressed during session: ADL's and self-care      AM-PAC OT "6 Clicks" Daily Activity     Outcome Measure   Help from another person eating meals?: A Lot Help from another person taking care of personal grooming?: Total Help from another person toileting, which includes using toliet, bedpan, or urinal?: A Lot Help from another person bathing (including washing, rinsing, drying)?: Total Help from another person to put on and taking off regular upper body clothing?: A Lot Help from another person to put on and taking off regular lower body clothing?: Total 6 Click Score: 9    End of Session    OT Visit Diagnosis: Unsteadiness on feet (R26.81);Muscle weakness (generalized) (M62.81);Pain Pain - Right/Left: Left   Activity Tolerance Patient limited by pain   Patient Left in bed;with call bell/phone within reach;with nursing/sitter in room;with family/visitor present   Nurse Communication Other (comment)(PT spoke with rn on need for md order for muscle relaxers for pt. also notified rn pt. needed pure wik placement)        Time: 5701-7793 OT Time Calculation (min): 28 min  Charges: OT General Charges $OT Visit: 1 Visit OT Treatments $Self Care/Home Management : 8-22 mins   Janice Coffin, COTA/L 04/06/2019, 2:32 PM

## 2019-04-06 NOTE — Progress Notes (Signed)
  NEUROSURGERY PROGRESS NOTE   No issues overnight. Much more alert today Reports persistent pain if left hip/lower back  No new symptoms She is eager for when she can be discharged home  EXAM:  BP 117/80 (BP Location: Left Arm)   Pulse 83   Temp 97.8 F (36.6 C) (Oral)   Resp 16   Ht 5' (1.524 m)   Wt 56.1 kg   SpO2 99%   BMI 24.15 kg/m   Awake, alert, oriented  Speech fluent, appropriate  CN grossly intact  5/5 BLE with exception of mild hip flexor weakness Incision c/d/o   PLAN Much improved today Will have her work with therapy Anticipate d/c in 24-48 hours

## 2019-04-06 NOTE — Progress Notes (Signed)
Physical Therapy Treatment Patient Details Name: Tiffany Gill MRN: [id number] DOB: Jan 31, 1941 Today's Date: 04/06/2019    History of Present Illness Pt is a 78 y/o female admitted secondary to worsening back pain and LE pain following recent PLIF. PMH includes anxiety, and HTN. Per notes, deciding if pt will require re-exploration of PLIF.     PT Comments    Asked to see pt by RN given 24-48 hr d/c per PA note.  Pt is severely limited in functional mobility due to excruciating pain in R>L buttocks, presenting like muscle spasms, and activated with any attempt at movement including sliding heel up bed and attempting to roll to side. Note MRI shows SDH in spine.  Unsure whether there is a relationship between pain and SDH or plans for further evaluation. Pt educated on deep breathing for relaxation and to cope with pain, unable to recall and needs frequent gently cues to attempt to utilize.  Unclear if pt is able to take medication for mm spasms given adverse responses previously.  Discussed with spouse pt's current deficits and extreme risk to her (and his) safety and quality of life if attempt to return home before pain is adequately managed and pt is able to mobilize OOB and walk household distances.    Recommend strategies to manage mm spasm including any pharmacological treatments, as well as heat or similar modality to increase circulation to buttocks muscles.  Recommend frequent repositioning to prevent pressure ulcers until pt able to move self in bed (and given chronic use of urinary management device). Recommend slowly increasing HOB up to MD recommended safe angle (?30 degree or less without brace) to assist in cardiovascular accommodation to upright positioning.  PT will continue plan of care in acute setting and progress as pt is able tolerate with hope for CIR admission once pt is able to mobilize.         Follow Up Recommendations  CIR     Equipment Recommendations        Recommendations for Other Services       Precautions / Restrictions Precautions Precautions: Back;Fall Precaution Booklet Issued: No Precaution Comments: pt has no recall of back precautions Required Braces or Orthoses: Spinal Brace Spinal Brace: Lumbar corset;Applied in sitting position Restrictions Weight Bearing Restrictions: No    Mobility  Bed Mobility Overal bed mobility: Needs Assistance Bed Mobility: Rolling Rolling: Total assist;+2 for physical assistance         General bed mobility comments: pt. with c/o severe spasms/pain mostly in R glute.  resulting in tremors, yelling, moaning.  max verbal guidance with breathing strategies to regroup during spasms.  refer to PT notes on attempts of movement of Bles.  educated and attempted portions of log roll technique. pt. unable to initiate without the spasm starting and full body tremors.  increased time in between each initiation of movement with breathing and relaxation techniques to relax.  after multiple attempts pt/ot utilized pads to roll pt. into L sidelying. pt. reported instant relief. pillow placed lower back with blanket between knees. pt. able to roll slightly back into pillow and states she feels so relaxed and comfortable.  educated pt. and spouse on need to slowly increase head of bed every 30 min. or so to increase pts. tolerance for upright posture and also pressure relief.  all of this in preparation for goal of sidelying to sit then progress from there.  Transfers  General transfer comment: unable to progress to transfers due to excruciating pain  Ambulation/Gait             General Gait Details: unable to attempt gait due to excruciating pain with rolling to side   Stairs             Wheelchair Mobility    Modified Rankin (Stroke Patients Only)       Balance                                            Cognition Arousal/Alertness:  Awake/alert Behavior During Therapy: Anxious;Restless Overall Cognitive Status: Impaired/Different from baseline Area of Impairment: Attention;Following commands;Awareness                   Current Attention Level: Focused Memory: Decreased recall of precautions Following Commands: Follows one step commands inconsistently;Follows one step commands with increased time Safety/Judgement: Decreased awareness of safety Awareness: Emergent Problem Solving: Decreased initiation;Difficulty sequencing;Requires verbal cues;Requires tactile cues General Comments: still presents with confusion mostly exacerbated by pain when mobility of any type is attempted; becomes vocal and anxious, rapid breathing, occasionally responds to cues for deep breathing and relaxtion      Exercises General Exercises - Lower Extremity Ankle Circles/Pumps: AROM;Both;5 reps Heel Slides: AAROM;Both;Other reps (comment)(1-2 reps then excrutiating mm spasms R glutes>L)    General Comments General comments (skin integrity, edema, etc.): spouse at bedside, included in education re: methods for pain management (can we try mm relaxer -- asked RN) and risk of d/c to home in current state      Pertinent Vitals/Pain Pain Assessment: 0-10 Pain Score: 10-Worst pain ever Faces Pain Scale: Hurts worst Pain Location: R glute spasms with any movement or postion change; some L glute involvement but eclipsed by R side Pain Descriptors / Indicators: Crying;Grimacing;Guarding;Moaning;Spasm;Sharp;Shooting;Stabbing Pain Intervention(s): Limited activity within patient's tolerance;Repositioned;Monitored during session;Patient requesting pain meds-RN notified;RN gave pain meds during session    Home Living                      Prior Function            PT Goals (current goals can now be found in the care plan section) Acute Rehab PT Goals Patient Stated Goal: to decrease pain  PT Goal Formulation: With patient Time  For Goal Achievement: 04/08/19 Potential to Achieve Goals: Fair Progress towards PT goals: Not progressing toward goals - comment(excrutiating pain in hip/glutes)    Frequency    Min 3X/week      PT Plan Current plan remains appropriate    Co-evaluation PT/OT/SLP Co-Evaluation/Treatment: Yes Reason for Co-Treatment: Complexity of the patient's impairments (multi-system involvement);Necessary to address cognition/behavior during functional activity;For patient/therapist safety;To address functional/ADL transfers PT goals addressed during session: Mobility/safety with mobility OT goals addressed during session: ADL's and self-care      AM-PAC PT "6 Clicks" Mobility   Outcome Measure  Help needed turning from your back to your side while in a flat bed without using bedrails?: Total Help needed moving from lying on your back to sitting on the side of a flat bed without using bedrails?: Total Help needed moving to and from a bed to a chair (including a wheelchair)?: Total Help needed standing up from a chair using your arms (e.g., wheelchair or bedside chair)?: Total Help needed to walk in hospital room?:  Total Help needed climbing 3-5 steps with a railing? : Total 6 Click Score: 6    End of Session   Activity Tolerance: Patient limited by pain;Treatment limited secondary to agitation Patient left: in bed;with call bell/phone within reach;with family/visitor present Nurse Communication: Mobility status PT Visit Diagnosis: Pain;Other symptoms and signs involving the nervous system (R29.898);Muscle weakness (generalized) (M62.81);Difficulty in walking, not elsewhere classified (R26.2) Pain - Right/Left: Right Pain - part of body: Hip     Time: 1345-1425 PT Time Calculation (min) (ACUTE ONLY): 40 min  Charges:  $Therapeutic Exercise: 8-22 mins $Therapeutic Activity: 8-22 mins $Self Care/Home Management: 8-22                     Kearney Hard, PT, DPT, MS Board Certified  Geriatric Clinical Specialist   Herbie Drape 04/06/2019, 2:37 PM

## 2019-04-07 ENCOUNTER — Inpatient Hospital Stay (HOSPITAL_COMMUNITY): Payer: Medicare Other

## 2019-04-07 LAB — CBC WITH DIFFERENTIAL/PLATELET
Abs Immature Granulocytes: 0.12 10*3/uL — ABNORMAL HIGH (ref 0.00–0.07)
Basophils Absolute: 0 10*3/uL (ref 0.0–0.1)
Basophils Relative: 0 %
Eosinophils Absolute: 0.4 10*3/uL (ref 0.0–0.5)
Eosinophils Relative: 4 %
HCT: 37.7 % (ref 36.0–46.0)
Hemoglobin: 12.7 g/dL (ref 12.0–15.0)
Immature Granulocytes: 1 %
Lymphocytes Relative: 10 %
Lymphs Abs: 1 10*3/uL (ref 0.7–4.0)
MCH: 32.5 pg (ref 26.0–34.0)
MCHC: 33.7 g/dL (ref 30.0–36.0)
MCV: 96.4 fL (ref 80.0–100.0)
Monocytes Absolute: 0.5 10*3/uL (ref 0.1–1.0)
Monocytes Relative: 5 %
Neutro Abs: 7.9 10*3/uL — ABNORMAL HIGH (ref 1.7–7.7)
Neutrophils Relative %: 80 %
Platelets: 310 10*3/uL (ref 150–400)
RBC: 3.91 MIL/uL (ref 3.87–5.11)
RDW: 12.9 % (ref 11.5–15.5)
WBC: 9.8 10*3/uL (ref 4.0–10.5)
nRBC: 0 % (ref 0.0–0.2)

## 2019-04-07 LAB — BASIC METABOLIC PANEL
Anion gap: 12 (ref 5–15)
BUN: 16 mg/dL (ref 8–23)
CO2: 21 mmol/L — ABNORMAL LOW (ref 22–32)
Calcium: 9.5 mg/dL (ref 8.9–10.3)
Chloride: 101 mmol/L (ref 98–111)
Creatinine, Ser: 1.13 mg/dL — ABNORMAL HIGH (ref 0.44–1.00)
GFR calc Af Amer: 54 mL/min — ABNORMAL LOW (ref >60)
GFR calc non Af Amer: 47 mL/min — ABNORMAL LOW (ref >60)
Glucose, Bld: 109 mg/dL — ABNORMAL HIGH (ref 70–99)
Potassium: 4.1 mmol/L (ref 3.5–5.1)
Sodium: 134 mmol/L — ABNORMAL LOW (ref 135–145)

## 2019-04-07 LAB — SEDIMENTATION RATE: Sed Rate: 50 mm/hr — ABNORMAL HIGH (ref 0–22)

## 2019-04-07 MED ORDER — PRO-STAT SUGAR FREE PO LIQD
30.0000 mL | Freq: Every day | ORAL | Status: DC
  Administered 2019-04-09 – 2019-04-14 (×6): 30 mL via ORAL
  Filled 2019-04-07 (×6): qty 30

## 2019-04-07 NOTE — Progress Notes (Signed)
Subjective: Patient reports Condition of severe back pain  Objective: Vital signs in last 24 hours: Temp:  [97.6 F (36.4 C)-98.2 F (36.8 C)] 97.6 F (36.4 C) (07/13 0817) Pulse Rate:  [79-89] 85 (07/13 0817) Resp:  [6-18] 18 (07/13 0817) BP: (122-144)/(66-76) 140/75 (07/13 0817) SpO2:  [92 %-100 %] 100 % (07/13 0817)  Intake/Output from previous day: 07/12 0701 - 07/13 0700 In: 240 [P.O.:240] Out: 575 [Urine:575] Intake/Output this shift: No intake/output data recorded.  Significant pain limited exam appears to have no focal lower extremity deficits localizes most for pain around the sacrum  Lab Results: No results for input(s): WBC, HGB, HCT, PLT in the last 72 hours. BMET No results for input(s): NA, K, CL, CO2, GLUCOSE, BUN, CREATININE, CALCIUM in the last 72 hours.  Studies/Results: No results found.  Assessment/Plan: Patient with persistent low back pain with some occasional radiation over the left hip however she denies any pain going down the legs she denies any numbness and tingling in her legs or feet.  Her exam is difficult secondary to pain limitation but appears to have intact strength and power lower legs.  Work-up has shown mild endplate fracture with subsidence of 1 of his cages stenosis being created by postoperative fluid collection and slight deformity related to fracture.  However patient continues to have no lower extremity radicular symptoms.  Back pain does not seem to be making good progress.  When I will order some follow-up blood work mental status does seem to be improving but I will check a BMP and a CBC as well as a sed rate and a repeat lumbar CT scan to make sure the fracture has not shifted displaced and recheck the screw position.  However with persistent pain to this level I may have to reexplore her fusion based on the results of her CT scan either tomorrow or Wednesday.  LOS: 14 days     Linard Daft P 04/07/2019, 9:28 AM

## 2019-04-07 NOTE — Progress Notes (Signed)
Physical Therapy Treatment Patient Details Name: Tiffany Gill MRN: [id number] DOB: January 09, 1941 Today's Date: 04/07/2019    History of Present Illness Pt is a 78 y/o female admitted secondary to worsening back pain and LE pain following recent PLIF. PMH includes anxiety, and HTN. Per notes, deciding if pt will require re-exploration of PLIF.     PT Comments    Patient seen for mobility progression. Pt continues to be limited by pain and anxiety with mobility. Pt tolerated OOB transfers X 2 this session with +2 assist for safety and max encouragement/relaxation techniques. Pt back to bed end of session due to transport arrival to take pt for repeat CT. Given pt's mobility level and activity tolerance recommend SNF for further skilled PT services to maximize independence and safety with mobility.    Follow Up Recommendations  SNF     Equipment Recommendations  Other (comment)(TBD)    Recommendations for Other Services       Precautions / Restrictions Precautions Precautions: Back;Fall Precaution Booklet Issued: No Precaution Comments: pt has no recall of back precautions Required Braces or Orthoses: Spinal Brace Spinal Brace: Lumbar corset;Applied in sitting position Restrictions Weight Bearing Restrictions: No    Mobility  Bed Mobility Overal bed mobility: Needs Assistance Bed Mobility: Rolling Rolling: +2 for physical assistance;Mod assist Sidelying to sit: Max assist;+2 for physical assistance;+2 for safety/equipment Supine to sit: Mod assist;+2 for physical assistance   Sit to sidelying: +2 for physical assistance;+2 for safety/equipment;Mod assist General bed mobility comments: assistance required to bring bilat LE/hips to EOB and to elevate trunk into sitting; Pt very anxious and requiring increased cuing and participation for mobility  Transfers Overall transfer level: Needs assistance Equipment used: 2 person hand held assist Transfers: Sit to/from [employer] Sit to Stand: Mod assist;+2 physical assistance;+2 safety/equipment Stand pivot transfers: +2 physical assistance;+2 safety/equipment;Mod assist       General transfer comment: cues for positioning (pt has posterior bias) and assistance to power up into standing and for balance/weight shifting to pivot X 2 trials   Ambulation/Gait             General Gait Details:  alot of time spent encouraging pt to attempt gait training; pt declining and became extremely anxious and then transport arrived to take pt for CT    Stairs             Wheelchair Mobility    Modified Rankin (Stroke Patients Only)       Balance Overall balance assessment: Needs assistance Sitting-balance support: Bilateral upper extremity supported;Feet supported Sitting balance-Leahy Scale: Poor Sitting balance - Comments: posterior bias Postural control: Posterior lean Standing balance support: Bilateral upper extremity supported;During functional activity Standing balance-Leahy Scale: Poor Standing balance comment: requires assistance for safety                            Cognition Arousal/Alertness: Awake/alert Behavior During Therapy: Anxious;Restless Overall Cognitive Status: Impaired/Different from baseline Area of Impairment: Following commands;Awareness                 Orientation Level: Disoriented to;Place;Time;Situation   Memory: Decreased recall of precautions Following Commands: Follows one step commands inconsistently;Follows one step commands with increased time Safety/Judgement: Decreased awareness of safety Awareness: Emergent Problem Solving: Decreased initiation;Difficulty sequencing;Requires verbal cues;Requires tactile cues General Comments: very anxious with any type of mobility, rapid breathing, and occasionally able to respond to cuing for deep breathing and visual imagery  Exercises      General Comments General comments (skin  integrity, edema, etc.): husband present      Pertinent Vitals/Pain Pain Assessment: Faces Faces Pain Scale: Hurts worst Pain Location: with all movement Pain Descriptors / Indicators: Crying;Grimacing;Guarding;Moaning;Spasm;Sharp;Shooting;Stabbing Pain Intervention(s): Limited activity within patient's tolerance;Monitored during session;Repositioned;Premedicated before session;Utilized relaxation techniques    Home Living                      Prior Function            PT Goals (current goals can now be found in the care plan section) Acute Rehab PT Goals Patient Stated Goal: to decrease pain  PT Goal Formulation: With patient Time For Goal Achievement: 04/08/19 Potential to Achieve Goals: Fair Progress towards PT goals: Not progressing toward goals - comment(continues to be limited by pain)    Frequency    Min 3X/week      PT Plan Discharge plan needs to be updated    Co-evaluation PT/OT/SLP Co-Evaluation/Treatment: Yes Reason for Co-Treatment: Complexity of the patient's impairments (multi-system involvement);Necessary to address cognition/behavior during functional activity;For patient/therapist safety;To address functional/ADL transfers PT goals addressed during session: Mobility/safety with mobility OT goals addressed during session: ADL's and self-care(functional transfer)      AM-PAC PT "6 Clicks" Mobility   Outcome Measure  Help needed turning from your back to your side while in a flat bed without using bedrails?: Total Help needed moving from lying on your back to sitting on the side of a flat bed without using bedrails?: A Lot Help needed moving to and from a bed to a chair (including a wheelchair)?: A Lot Help needed standing up from a chair using your arms (e.g., wheelchair or bedside chair)?: A Lot Help needed to walk in hospital room?: Total Help needed climbing 3-5 steps with a railing? : Total 6 Click Score: 9    End of Session Equipment  Utilized During Treatment: Gait belt;Back brace Activity Tolerance: Patient limited by pain Patient left: in bed;with call bell/phone within reach;with family/visitor present;Other (comment)(transport present) Nurse Communication: Mobility status PT Visit Diagnosis: Pain;Other symptoms and signs involving the nervous system (R29.898);Muscle weakness (generalized) (M62.81);Difficulty in walking, not elsewhere classified (R26.2) Pain - Right/Left: Right Pain - part of body: Hip     Time: 9407-6808 PT Time Calculation (min) (ACUTE ONLY): 29 min  Charges:  $Gait Training: 8-22 mins                     Earney Navy, PTA Acute Rehabilitation Services Pager: [phone] Office: [phone]     Darliss Cheney 04/07/2019, 1:15 PM

## 2019-04-07 NOTE — Progress Notes (Signed)
Occupational Therapy Treatment Patient Details Name: Tiffany Gill MRN: [id number] DOB: 01-Jul-1941 Today's Date: 04/07/2019    History of present illness Pt is a 78 y/o female admitted secondary to worsening back pain and LE pain following recent PLIF. PMH includes anxiety, and HTN. Per notes, deciding if pt will require re-exploration of PLIF.    OT comments  Pt making limited progress towards therapy goals secondary to decreased motivation with increased pain and anxiety. Pt required maximal encouragement for participation from OT,PT, and husband present in the room. Pt sitting on EOB with posterior bias as pt making multiple attempts to lay down. Pt transferred into recliner chair with mod +2 assistance without use of AD. Use of visual imagery and deep breathing attempts to calm pt to attempt ambulating but she refuses. Follow up recommendations changed to SNF secondary to pt being unable to participate in 3 hours of therapy at inpatient rehab level.    Follow Up Recommendations  SNF    Equipment Recommendations  Other (comment)(defer to next venue of care)    Recommendations for Other Services Rehab consult    Precautions / Restrictions Precautions Precautions: Back;Fall Precaution Booklet Issued: No Precaution Comments: pt has no recall of back precautions Required Braces or Orthoses: Spinal Brace Spinal Brace: Lumbar corset;Applied in sitting position Restrictions Weight Bearing Restrictions: No       Mobility Bed Mobility Overal bed mobility: Needs Assistance Bed Mobility: Rolling Rolling: +2 for physical assistance;Mod assist   Supine to sit: Mod assist;+2 for physical assistance     General bed mobility comments: Pt very anxious and requiring increased cuing and participation for mobility  Transfers Overall transfer level: Needs assistance Equipment used: 2 person hand held assist Transfers: Sit to/from [employer] Transfers Sit to Stand: Mod assist;+2  physical assistance;+2 safety/equipment Stand pivot transfers: +2 physical assistance;+2 safety/equipment;Mod assist            Balance Overall balance assessment: Needs assistance Sitting-balance support: Bilateral upper extremity supported;Feet supported Sitting balance-Leahy Scale: Poor Sitting balance - Comments: posterior bias Postural control: Posterior lean Standing balance support: Bilateral upper extremity supported;During functional activity Standing balance-Leahy Scale: Poor Standing balance comment: requires assistance for safety          ADL either performed or assessed with clinical judgement                  Cognition Arousal/Alertness: Awake/alert Behavior During Therapy: Anxious;Restless Overall Cognitive Status: Impaired/Different from baseline Area of Impairment: Attention;Following commands;Awareness          Orientation Level: Disoriented to;Place;Time;Situation   Memory: Decreased recall of precautions Following Commands: Follows one step commands inconsistently;Follows one step commands with increased time Safety/Judgement: Decreased awareness of safety Awareness: Emergent Problem Solving: Decreased initiation;Difficulty sequencing;Requires verbal cues;Requires tactile cues General Comments: very anxious with any type of mobility, rapid breathing, and occasionally able to respond to cuing for deep breathing and visual imagery                   Pertinent Vitals/ Pain       Pain Assessment: Faces Faces Pain Scale: Hurts worst Pain Location: with all movement Pain Descriptors / Indicators: Crying;Grimacing;Guarding;Moaning;Spasm;Sharp;Shooting;Stabbing Pain Intervention(s): Limited activity within patient's tolerance;Monitored during session;Premedicated before session;Repositioned         Frequency  Min 2X/week        Progress Toward Goals  OT Goals(current goals can now be found in the care plan section)  Progress towards  OT goals: Progressing toward  goals  Acute Rehab OT Goals Patient Stated Goal: to decrease pain  OT Goal Formulation: With patient/family Time For Goal Achievement: 04/21/19 Potential to Achieve Goals: [place]  Plan Discharge plan needs to be updated    Co-evaluation    PT/OT/SLP Co-Evaluation/Treatment: Yes Reason for Co-Treatment: Complexity of the patient's impairments (multi-system involvement);Necessary to address cognition/behavior during functional activity;For patient/therapist safety;To address functional/ADL transfers PT goals addressed during session: Mobility/safety with mobility OT goals addressed during session: ADL's and self-care(functional transfer)      AM-PAC OT "6 Clicks" Daily Activity     Outcome Measure   Help from another person eating meals?: A Lot Help from another person taking care of personal grooming?: Total Help from another person toileting, which includes using toliet, bedpan, or urinal?: Total Help from another person bathing (including washing, rinsing, drying)?: Total Help from another person to put on and taking off regular upper body clothing?: Total Help from another person to put on and taking off regular lower body clothing?: Total 6 Click Score: 7    End of Session Equipment Utilized During Treatment: Gait belt;Rolling walker;Back brace  OT Visit Diagnosis: Unsteadiness on feet (R26.81);Muscle weakness (generalized) (M62.81);Pain Pain - Right/Left: Left   Activity Tolerance Patient limited by pain   Patient Left in bed;with call bell/phone within reach;with nursing/sitter in room;with family/visitor present           Time: 1005-1034 OT Time Calculation (min): 29 min  Charges: OT General Charges $OT Visit: 1 Visit OT Treatments $Therapeutic Activity: 8-22 mins   Ewelina Naves P, MS, OTR/L 04/07/2019, 12:23 PM

## 2019-04-07 NOTE — Progress Notes (Signed)
Nutrition Follow-up  DOCUMENTATION CODES:   Not applicable  INTERVENTION:  Continue Ensure Enlive po BID, each supplement provides 350 kcal and 20 grams of protein.  Provide 30 ml Prostat po once daily, each supplement provides 100 kcal and 15 grams of protein.   Encourage adequate PO intake.   NUTRITION DIAGNOSIS:   Increased nutrient needs related to post-op healing as evidenced by estimated needs; ongoing  GOAL:   Patient will meet greater than or equal to 90% of their needs; progressing  MONITOR:   PO intake, Supplement acceptance, Skin, Weight trends, Labs, I & O's  REASON FOR ASSESSMENT:   Malnutrition Screening Tool    ASSESSMENT:   78 year old with worsening back pain status post lumbar fusion admitted for pain management and workup. Work-up has shown mild endplate fracture with subsidence of 1 of her cages stenosis being created by postoperative fluid collection and slight deformity related to fracture.  Meal completion has been varied from 30-75%. Work up ongoing for back pain. Pt currently has Ensure ordered with varied consumption. RD to continue with current orders as well as provide Prostat to aid in protein needs. Pt encouraged to eat her food at meals and to drink her supplements.   Labs and medications reviewed.   Diet Order:   Diet Order            Diet regular Room service appropriate? No; Fluid consistency: Thin  Diet effective now              EDUCATION NEEDS:   Not appropriate for education at this time  Skin:  Skin Assessment: Skin Integrity Issues: Skin Integrity Issues:: Incisions Incisions: back  Last BM:  7/8  Height:   Ht Readings from Last 1 Encounters:  03/24/19 5' (1.524 m)    Weight:   Wt Readings from Last 1 Encounters:  03/24/19 56.1 kg    Ideal Body Weight:  45.45 kg  BMI:  Body mass index is 24.15 kg/m.  Estimated Nutritional Needs:   Kcal:  1650-1800  Protein:  75-90 grams  Fluid:  1.6- 1.8  L/day    Corrin Parker, MS, RD, LDN Pager # [phone] After hours/ weekend pager # [phone]

## 2019-04-08 ENCOUNTER — Inpatient Hospital Stay (HOSPITAL_COMMUNITY): Payer: Medicare Other

## 2019-04-08 ENCOUNTER — Encounter (HOSPITAL_COMMUNITY): Payer: Self-pay | Admitting: Orthopedic Surgery

## 2019-04-08 ENCOUNTER — Encounter (HOSPITAL_COMMUNITY)
Admission: AD | Disposition: A | Discharge status: Rehabilitation Facility | Payer: Self-pay | Source: Ambulatory Visit | Attending: Neurosurgery

## 2019-04-08 ENCOUNTER — Inpatient Hospital Stay (HOSPITAL_COMMUNITY): Payer: Medicare Other | Admitting: Anesthesiology

## 2019-04-08 DIAGNOSIS — M4310 Spondylolisthesis, site unspecified: Secondary | ICD-10-CM

## 2019-04-08 LAB — TYPE AND SCREEN
ABO/RH(D): O POS
Antibody Screen: NEGATIVE

## 2019-04-08 SURGERY — POSTERIOR LUMBAR FUSION 1 WITH HARDWARE REMOVAL
Anesthesia: General | Site: Back

## 2019-04-08 MED ORDER — KETAMINE HCL 50 MG/5ML IJ SOSY
PREFILLED_SYRINGE | INTRAMUSCULAR | Status: AC
  Filled 2019-04-08: qty 5

## 2019-04-08 MED ORDER — SODIUM CHLORIDE 0.9 % IV SOLN
INTRAVENOUS | Status: DC | PRN
  Administered 2019-04-08: 500 mL

## 2019-04-08 MED ORDER — SODIUM CHLORIDE 0.9% FLUSH
3.0000 mL | Freq: Two times a day (BID) | INTRAVENOUS | Status: DC
  Administered 2019-04-08 – 2019-04-14 (×8): 3 mL via INTRAVENOUS

## 2019-04-08 MED ORDER — HYDROCODONE-ACETAMINOPHEN 10-325 MG PO TABS
1.0000 | ORAL_TABLET | Freq: Four times a day (QID) | ORAL | Status: DC | PRN
  Administered 2019-04-10 – 2019-04-14 (×5): 1 via ORAL
  Filled 2019-04-08 (×5): qty 1

## 2019-04-08 MED ORDER — CYCLOBENZAPRINE HCL 10 MG PO TABS
10.0000 mg | ORAL_TABLET | Freq: Three times a day (TID) | ORAL | Status: DC | PRN
  Administered 2019-04-09 – 2019-04-13 (×4): 10 mg via ORAL
  Filled 2019-04-08 (×4): qty 1

## 2019-04-08 MED ORDER — DEXAMETHASONE SODIUM PHOSPHATE 10 MG/ML IJ SOLN
INTRAMUSCULAR | Status: DC | PRN
  Administered 2019-04-08: 5 mg via INTRAVENOUS

## 2019-04-08 MED ORDER — THROMBIN 20000 UNITS EX SOLR
CUTANEOUS | Status: DC | PRN
  Administered 2019-04-08: 13:00:00 20 mL via TOPICAL

## 2019-04-08 MED ORDER — PHENYLEPHRINE 40 MCG/ML (10ML) SYRINGE FOR IV PUSH (FOR BLOOD PRESSURE SUPPORT)
PREFILLED_SYRINGE | INTRAVENOUS | Status: DC | PRN
  Administered 2019-04-08: 120 ug via INTRAVENOUS
  Administered 2019-04-08: 80 ug via INTRAVENOUS
  Administered 2019-04-08 (×4): 120 ug via INTRAVENOUS
  Administered 2019-04-08 (×2): 80 ug via INTRAVENOUS

## 2019-04-08 MED ORDER — SODIUM CHLORIDE 0.9 % IV SOLN
250.0000 mL | INTRAVENOUS | Status: DC
  Administered 2019-04-08: 250 mL via INTRAVENOUS

## 2019-04-08 MED ORDER — ALBUMIN HUMAN 5 % IV SOLN
INTRAVENOUS | Status: DC | PRN
  Administered 2019-04-08: 15:00:00 via INTRAVENOUS

## 2019-04-08 MED ORDER — OXYCODONE HCL 5 MG/5ML PO SOLN: 5.0000 mg | Freq: Once | ORAL | Status: DC | PRN

## 2019-04-08 MED ORDER — PANTOPRAZOLE SODIUM 40 MG IV SOLR
40.0000 mg | Freq: Every day | INTRAVENOUS | Status: DC
  Administered 2019-04-08 – 2019-04-09 (×2): 40 mg via INTRAVENOUS
  Filled 2019-04-08 (×2): qty 40

## 2019-04-08 MED ORDER — ACETAMINOPHEN 325 MG PO TABS: 650.0000 mg | ORAL_TABLET | ORAL | Status: DC | PRN

## 2019-04-08 MED ORDER — FENTANYL CITRATE (PF) 250 MCG/5ML IJ SOLN
INTRAMUSCULAR | Status: DC | PRN
  Administered 2019-04-08: 25 ug via INTRAVENOUS
  Administered 2019-04-08: 100 ug via INTRAVENOUS
  Administered 2019-04-08: 25 ug via INTRAVENOUS

## 2019-04-08 MED ORDER — BUPIVACAINE LIPOSOME 1.3 % IJ SUSP
20.0000 mL | Freq: Once | INTRAMUSCULAR | Status: DC
  Filled 2019-04-08: qty 20

## 2019-04-08 MED ORDER — SUCCINYLCHOLINE CHLORIDE 20 MG/ML IJ SOLN
INTRAMUSCULAR | Status: DC | PRN
  Administered 2019-04-08: 100 mg via INTRAVENOUS

## 2019-04-08 MED ORDER — BUPIVACAINE LIPOSOME 1.3 % IJ SUSP
INTRAMUSCULAR | Status: DC | PRN
  Administered 2019-04-08: 20 mL

## 2019-04-08 MED ORDER — KETAMINE HCL 50 MG/ML IJ SOLN
INTRAMUSCULAR | Status: DC | PRN
  Administered 2019-04-08 (×3): 10 mg via INTRAMUSCULAR

## 2019-04-08 MED ORDER — LACTATED RINGERS IV SOLN
INTRAVENOUS | Status: DC
  Administered 2019-04-08: 14:00:00 via INTRAVENOUS

## 2019-04-08 MED ORDER — MENTHOL 3 MG MT LOZG: 1.0000 | LOZENGE | OROMUCOSAL | Status: DC | PRN

## 2019-04-08 MED ORDER — MIDAZOLAM HCL 5 MG/5ML IJ SOLN
INTRAMUSCULAR | Status: DC | PRN
  Administered 2019-04-08: 1 mg via INTRAVENOUS

## 2019-04-08 MED ORDER — EPHEDRINE SULFATE-NACL 50-0.9 MG/10ML-% IV SOSY
PREFILLED_SYRINGE | INTRAVENOUS | Status: DC | PRN
  Administered 2019-04-08: 5 mg via INTRAVENOUS

## 2019-04-08 MED ORDER — HYDROMORPHONE HCL 1 MG/ML IJ SOLN
0.5000 mg | INTRAMUSCULAR | Status: DC | PRN
  Administered 2019-04-11: 0.5 mg via INTRAVENOUS
  Filled 2019-04-08: qty 1

## 2019-04-08 MED ORDER — 0.9 % SODIUM CHLORIDE (POUR BTL) OPTIME
TOPICAL | Status: DC | PRN
  Administered 2019-04-08: 1000 mL

## 2019-04-08 MED ORDER — THROMBIN 20000 UNITS EX SOLR
CUTANEOUS | Status: AC
  Filled 2019-04-08: qty 20000

## 2019-04-08 MED ORDER — ALUM & MAG HYDROXIDE-SIMETH 200-200-20 MG/5ML PO SUSP
30.0000 mL | Freq: Four times a day (QID) | ORAL | Status: DC | PRN
  Administered 2019-04-09: 30 mL via ORAL
  Filled 2019-04-08: qty 30

## 2019-04-08 MED ORDER — LACTATED RINGERS IV SOLN
INTRAVENOUS | Status: DC | PRN
  Administered 2019-04-08: 14:00:00 via INTRAVENOUS

## 2019-04-08 MED ORDER — OXYCODONE HCL 5 MG PO TABS
10.0000 mg | ORAL_TABLET | ORAL | Status: DC | PRN
  Administered 2019-04-09 – 2019-04-14 (×10): 10 mg via ORAL
  Filled 2019-04-08 (×11): qty 2

## 2019-04-08 MED ORDER — PROPOFOL 10 MG/ML IV BOLUS
INTRAVENOUS | Status: DC | PRN
  Administered 2019-04-08: 100 mg via INTRAVENOUS

## 2019-04-08 MED ORDER — FENTANYL CITRATE (PF) 100 MCG/2ML IJ SOLN
INTRAMUSCULAR | Status: AC
  Filled 2019-04-08: qty 2

## 2019-04-08 MED ORDER — HYDROMORPHONE HCL 1 MG/ML IJ SOLN
0.5000 mg | INTRAMUSCULAR | Status: AC | PRN
  Administered 2019-04-08 (×4): 0.5 mg via INTRAVENOUS

## 2019-04-08 MED ORDER — SODIUM CHLORIDE 0.9% FLUSH: 3.0000 mL | INTRAVENOUS | Status: DC | PRN

## 2019-04-08 MED ORDER — FENTANYL CITRATE (PF) 250 MCG/5ML IJ SOLN
INTRAMUSCULAR | Status: AC
  Filled 2019-04-08: qty 5

## 2019-04-08 MED ORDER — OXYCODONE HCL 5 MG PO TABS: 5.0000 mg | ORAL_TABLET | Freq: Once | ORAL | Status: DC | PRN

## 2019-04-08 MED ORDER — THROMBIN 5000 UNITS EX SOLR
CUTANEOUS | Status: AC
  Filled 2019-04-08: qty 5000

## 2019-04-08 MED ORDER — ONDANSETRON HCL 4 MG/2ML IJ SOLN
INTRAMUSCULAR | Status: DC | PRN
  Administered 2019-04-08: 4 mg via INTRAVENOUS

## 2019-04-08 MED ORDER — SODIUM CHLORIDE 0.9 % IV SOLN
INTRAVENOUS | Status: DC | PRN
  Administered 2019-04-08: 45 ug/min via INTRAVENOUS

## 2019-04-08 MED ORDER — FENTANYL CITRATE (PF) 100 MCG/2ML IJ SOLN
25.0000 ug | INTRAMUSCULAR | Status: DC | PRN
  Administered 2019-04-08 (×3): 50 ug via INTRAVENOUS

## 2019-04-08 MED ORDER — MIDAZOLAM HCL 2 MG/2ML IJ SOLN
INTRAMUSCULAR | Status: AC
  Filled 2019-04-08: qty 2

## 2019-04-08 MED ORDER — BUPIVACAINE HCL (PF) 0.25 % IJ SOLN
INTRAMUSCULAR | Status: AC
  Filled 2019-04-08: qty 30

## 2019-04-08 MED ORDER — LIDOCAINE 2% (20 MG/ML) 5 ML SYRINGE
INTRAMUSCULAR | Status: DC | PRN
  Administered 2019-04-08: 60 mg via INTRAVENOUS
  Administered 2019-04-08: 40 mg via INTRAVENOUS

## 2019-04-08 MED ORDER — PREDNISONE 5 MG PO TABS
10.0000 mg | ORAL_TABLET | Freq: Every day | ORAL | Status: DC
  Administered 2019-04-09 – 2019-04-14 (×6): 10 mg via ORAL
  Filled 2019-04-08 (×6): qty 2

## 2019-04-08 MED ORDER — PHENOL 1.4 % MT LIQD: 1.0000 | OROMUCOSAL | Status: DC | PRN

## 2019-04-08 MED ORDER — CEFAZOLIN SODIUM-DEXTROSE 2-4 GM/100ML-% IV SOLN
2.0000 g | Freq: Three times a day (TID) | INTRAVENOUS | Status: AC
  Administered 2019-04-08 – 2019-04-10 (×6): 2 g via INTRAVENOUS
  Filled 2019-04-08 (×6): qty 100

## 2019-04-08 MED ORDER — ONDANSETRON HCL 4 MG/2ML IJ SOLN: 4.0000 mg | Freq: Four times a day (QID) | INTRAMUSCULAR | Status: DC | PRN

## 2019-04-08 MED ORDER — SUGAMMADEX SODIUM 200 MG/2ML IV SOLN
INTRAVENOUS | Status: DC | PRN
  Administered 2019-04-08: 150 mg via INTRAVENOUS

## 2019-04-08 MED ORDER — LIDOCAINE-EPINEPHRINE 1 %-1:100000 IJ SOLN
INTRAMUSCULAR | Status: AC
  Filled 2019-04-08: qty 1

## 2019-04-08 MED ORDER — HYDROMORPHONE HCL 1 MG/ML IJ SOLN
INTRAMUSCULAR | Status: AC
  Filled 2019-04-08: qty 2

## 2019-04-08 MED ORDER — LIDOCAINE-EPINEPHRINE 1 %-1:100000 IJ SOLN
INTRAMUSCULAR | Status: DC | PRN
  Administered 2019-04-08: 7 mL

## 2019-04-08 MED ORDER — ACETAMINOPHEN 650 MG RE SUPP: 650.0000 mg | RECTAL | Status: DC | PRN

## 2019-04-08 MED ORDER — PROPOFOL 10 MG/ML IV BOLUS
INTRAVENOUS | Status: AC
  Filled 2019-04-08: qty 20

## 2019-04-08 MED ORDER — ONDANSETRON HCL 4 MG/2ML IJ SOLN: 4.0000 mg | Freq: Once | INTRAMUSCULAR | Status: DC | PRN

## 2019-04-08 MED ORDER — ROCURONIUM BROMIDE 10 MG/ML (PF) SYRINGE
PREFILLED_SYRINGE | INTRAVENOUS | Status: DC | PRN
Start: 2019-04-08 — End: 2019-04-08
  Administered 2019-04-08: 20 mg via INTRAVENOUS
  Administered 2019-04-08: 60 mg via INTRAVENOUS

## 2019-04-08 MED ORDER — CEFAZOLIN SODIUM-DEXTROSE 2-3 GM-%(50ML) IV SOLR
INTRAVENOUS | Status: DC | PRN
  Administered 2019-04-08: 2 g via INTRAVENOUS

## 2019-04-08 MED ORDER — ONDANSETRON HCL 4 MG PO TABS: 4.0000 mg | ORAL_TABLET | Freq: Four times a day (QID) | ORAL | Status: DC | PRN

## 2019-04-08 SURGICAL SUPPLY — 89 items
ADH SKN CLS APL DERMABOND .7 (GAUZE/BANDAGES/DRESSINGS) ×1
APL SKNCLS STERI-STRIP NONHPOA (GAUZE/BANDAGES/DRESSINGS) ×1
BAG DECANTER FOR FLEXI CONT (MISCELLANEOUS) ×3 IMPLANT
BENZOIN TINCTURE PRP APPL 2/3 (GAUZE/BANDAGES/DRESSINGS) ×3 IMPLANT
BLADE CLIPPER SURG (BLADE) IMPLANT
BLADE SURG 11 STRL SS (BLADE) ×3 IMPLANT
BONE VIVIGEN FORMABLE 10CC (Bone Implant) ×3 IMPLANT
BUR CUTTER 7.0 ROUND (BURR) ×3 IMPLANT
BUR MATCHSTICK NEURO 3.0 LAGG (BURR) ×3 IMPLANT
CANISTER SUCT 3000ML PPV (MISCELLANEOUS) ×3 IMPLANT
CAP LOCKING THREADED (Cap) ×14 IMPLANT
CARTRIDGE OIL MAESTRO DRILL (MISCELLANEOUS) ×1 IMPLANT
CLOSURE WOUND 1/2 X4 (GAUZE/BANDAGES/DRESSINGS) ×1
CONT SPEC 4OZ CLIKSEAL STRL BL (MISCELLANEOUS) ×5 IMPLANT
COVER BACK TABLE 60X90IN (DRAPES) ×3 IMPLANT
COVER WAND RF STERILE (DRAPES) ×3 IMPLANT
DECANTER SPIKE VIAL GLASS SM (MISCELLANEOUS) ×3 IMPLANT
DERMABOND ADVANCED (GAUZE/BANDAGES/DRESSINGS) ×2
DERMABOND ADVANCED .7 DNX12 (GAUZE/BANDAGES/DRESSINGS) ×1 IMPLANT
DIFFUSER DRILL AIR PNEUMATIC (MISCELLANEOUS) ×3 IMPLANT
DRAPE C-ARM 42X72 X-RAY (DRAPES) ×6 IMPLANT
DRAPE C-ARMOR (DRAPES) IMPLANT
DRAPE HALF SHEET 40X57 (DRAPES) IMPLANT
DRAPE LAPAROTOMY 100X72X124 (DRAPES) ×3 IMPLANT
DRAPE SURG 17X23 STRL (DRAPES) ×3 IMPLANT
DRSG OPSITE 4X5.5 SM (GAUZE/BANDAGES/DRESSINGS) ×3 IMPLANT
DRSG OPSITE POSTOP 4X6 (GAUZE/BANDAGES/DRESSINGS) ×3 IMPLANT
DRSG OPSITE POSTOP 4X8 (GAUZE/BANDAGES/DRESSINGS) ×2 IMPLANT
DURAPREP 26ML APPLICATOR (WOUND CARE) ×3 IMPLANT
ELECT REM PT RETURN 9FT ADLT (ELECTROSURGICAL) ×3
ELECTRODE REM PT RTRN 9FT ADLT (ELECTROSURGICAL) ×1 IMPLANT
EVACUATOR 3/16  PVC DRAIN (DRAIN) ×2
EVACUATOR 3/16 PVC DRAIN (DRAIN) ×1 IMPLANT
FIBER BOAT ALLOFUSE 7.5CC (Bone Implant) ×2 IMPLANT
GAUZE 4X4 16PLY RFD (DISPOSABLE) IMPLANT
GAUZE SPONGE 4X4 12PLY STRL (GAUZE/BANDAGES/DRESSINGS) ×3 IMPLANT
GLOVE BIO SURGEON STRL SZ7 (GLOVE) IMPLANT
GLOVE BIO SURGEON STRL SZ8 (GLOVE) ×6 IMPLANT
GLOVE BIOGEL PI IND STRL 7.0 (GLOVE) IMPLANT
GLOVE BIOGEL PI IND STRL 7.5 (GLOVE) IMPLANT
GLOVE BIOGEL PI INDICATOR 7.0 (GLOVE)
GLOVE BIOGEL PI INDICATOR 7.5 (GLOVE) ×10
GLOVE ECLIPSE 7.5 STRL STRAW (GLOVE) IMPLANT
GLOVE EXAM NITRILE XL STR (GLOVE) IMPLANT
GLOVE INDICATOR 8.5 STRL (GLOVE) ×6 IMPLANT
GLOVE SURG SS PI 7.5 STRL IVOR (GLOVE) ×2 IMPLANT
GOWN STRL REUS W/ TWL LRG LVL3 (GOWN DISPOSABLE) IMPLANT
GOWN STRL REUS W/ TWL XL LVL3 (GOWN DISPOSABLE) ×2 IMPLANT
GOWN STRL REUS W/TWL 2XL LVL3 (GOWN DISPOSABLE) IMPLANT
GOWN STRL REUS W/TWL LRG LVL3 (GOWN DISPOSABLE) ×3
GOWN STRL REUS W/TWL XL LVL3 (GOWN DISPOSABLE) ×6
GRAFT BNE MATRIX VG FRMBL L 10 (Bone Implant) IMPLANT
HEMOSTAT POWDER KIT SURGIFOAM (HEMOSTASIS) IMPLANT
IMPL CROSSLINK 32-40 (Neuro Prosthesis/Implant) IMPLANT
IMPLANT CROSSLINK 32-40 (Neuro Prosthesis/Implant) ×3 IMPLANT
KIT BASIN OR (CUSTOM PROCEDURE TRAY) ×3 IMPLANT
KIT INFUSE SMALL (Orthopedic Implant) ×2 IMPLANT
KIT TURNOVER KIT B (KITS) ×3 IMPLANT
MILL MEDIUM DISP (BLADE) ×3 IMPLANT
NDL HYPO 21X1.5 SAFETY (NEEDLE) ×1 IMPLANT
NDL HYPO 25X1 1.5 SAFETY (NEEDLE) ×1 IMPLANT
NEEDLE HYPO 21X1.5 SAFETY (NEEDLE) ×3 IMPLANT
NEEDLE HYPO 25X1 1.5 SAFETY (NEEDLE) ×3 IMPLANT
NS IRRIG 1000ML POUR BTL (IV SOLUTION) ×3 IMPLANT
OIL CARTRIDGE MAESTRO DRILL (MISCELLANEOUS) ×3
PACK LAMINECTOMY NEURO (CUSTOM PROCEDURE TRAY) ×3 IMPLANT
PAD ARMBOARD 7.5X6 YLW CONV (MISCELLANEOUS) ×9 IMPLANT
ROD 85MM SPINAL (Rod) ×2 IMPLANT
ROD CREO 80MM SPINAL (Rod) ×2 IMPLANT
SCREW AMP MODULAR CREO 6.5X45 (Screw) ×4 IMPLANT
SCREW CREO 4.5X40MM (Screw) ×2 IMPLANT
SCREW CREO 5.5X40 (Screw) ×4 IMPLANT
SCREW CREO 6.5X35MM (Screw) ×4 IMPLANT
SCREW PA THRD CREO TULIP 5.5X4 (Head) ×12 IMPLANT
SPONGE LAP 4X18 RFD (DISPOSABLE) IMPLANT
SPONGE SURGIFOAM ABS GEL 100 (HEMOSTASIS) ×3 IMPLANT
STRIP CLOSURE SKIN 1/2X4 (GAUZE/BANDAGES/DRESSINGS) ×3 IMPLANT
SUT VIC AB 0 CT1 18XCR BRD8 (SUTURE) ×2 IMPLANT
SUT VIC AB 0 CT1 8-18 (SUTURE) ×6
SUT VIC AB 2-0 CT1 18 (SUTURE) ×3 IMPLANT
SUT VIC AB 4-0 PS2 27 (SUTURE) ×3 IMPLANT
SYR 20CC LL (SYRINGE) ×2 IMPLANT
SYR CONTROL 10ML LL (SYRINGE) ×2 IMPLANT
TOWEL GREEN STERILE (TOWEL DISPOSABLE) ×3 IMPLANT
TOWEL GREEN STERILE FF (TOWEL DISPOSABLE) ×3 IMPLANT
TRAY FOL W/BAG SLVR 16FR STRL (SET/KITS/TRAYS/PACK) IMPLANT
TRAY FOLEY MTR SLVR 16FR STAT (SET/KITS/TRAYS/PACK) ×1 IMPLANT
TRAY FOLEY W/BAG SLVR 16FR LF (SET/KITS/TRAYS/PACK) ×3
WATER STERILE IRR 1000ML POUR (IV SOLUTION) ×3 IMPLANT

## 2019-04-08 NOTE — Transfer of Care (Signed)
Immediate Anesthesia Transfer of Care Note  Patient: Tiffany Gill  Procedure(s) Performed: Re-exploration of Lumbar four-five Posterior Lumbar interbody Fusion, Removal bilateral Lumbar four-Lumbar five cortical screws, Removal of bilateral cages Lumbar four-five, Pedicle screw fixation Lumbar three-Sacral one, Posterolateral arthrodesis Lumbar three-Sacral one (N/A Back)  Patient Location: PACU  Anesthesia Type:General  Level of Consciousness: drowsy  Airway & Oxygen Therapy: Patient Spontanous Breathing and Patient connected to nasal cannula oxygen  Post-op Assessment: Report given to RN and Post -op Vital signs reviewed and stable  Post vital signs: Reviewed and stable  Last Vitals:  Vitals Value Taken Time  BP 115/63 04/08/19 1759  Temp    Pulse 83 04/08/19 1803  Resp 18 04/08/19 1803  SpO2 100 % 04/08/19 1803  Vitals shown include unvalidated device data.  Last Pain:  Vitals:   04/08/19 1207  TempSrc: Oral  PainSc:       Patients Stated Pain Goal: 3 (95/07/22 5750)  Complications: No apparent anesthesia complications

## 2019-04-08 NOTE — Anesthesia Preprocedure Evaluation (Signed)
Anesthesia Evaluation  Patient identified by MRN, date of birth, ID band Patient awake    Reviewed: Allergy & Precautions, NPO status , Patient's Chart, lab work & pertinent test results  Airway Mallampati: III  TM Distance: <3 FB     Dental  (+) Teeth Intact, Dental Advisory Given   Pulmonary     + decreased breath sounds      Cardiovascular hypertension, Pt. on medications  Rhythm:Regular Rate:Normal     Neuro/Psych Anxiety Depression    GI/Hepatic negative GI ROS, Neg liver ROS,   Endo/Other  Hypothyroidism   Renal/GU Renal disease     Musculoskeletal  (+) Arthritis , Osteoarthritis,    Abdominal Normal abdominal exam  (+)   Peds  Hematology   Anesthesia Other Findings   Reproductive/Obstetrics                             Anesthesia Physical Anesthesia Plan  ASA: III  Anesthesia Plan: General   Post-op Pain Management:    Induction: Intravenous  PONV Risk Score and Plan: 4 or greater and Ondansetron, Dexamethasone and Treatment may vary due to age or medical condition  Airway Management Planned: Oral ETT  Additional Equipment: None  Intra-op Plan:   Post-operative Plan: Extubation in OR  Informed Consent: I have reviewed the patients History and Physical, chart, labs and discussed the procedure including the risks, benefits and alternatives for the proposed anesthesia with the patient or authorized representative who has indicated his/her understanding and acceptance.     Dental advisory given  Plan Discussed with: CRNA  Anesthesia Plan Comments:         Anesthesia Quick Evaluation

## 2019-04-08 NOTE — Anesthesia Procedure Notes (Addendum)
Procedure Name: Intubation Date/Time: 04/08/2019 2:08 PM Performed by: Julieta Bellini, CRNA Pre-anesthesia Checklist: Patient identified, Emergency Drugs available, Suction available and Patient being monitored Patient Re-evaluated:Patient Re-evaluated prior to induction Oxygen Delivery Method: Circle system utilized Preoxygenation: Pre-oxygenation with 100% oxygen Induction Type: IV induction Ventilation: Mask ventilation without difficulty Laryngoscope Size: Mac and 3 Grade View: Grade II Tube type: Oral Tube size: 7.0 mm Number of attempts: 1 Airway Equipment and Method: Bougie stylet Placement Confirmation: ETT inserted through vocal cords under direct vision,  positive ETCO2 and breath sounds checked- equal and bilateral Secured at: 21 cm Tube secured with: Tape Dental Injury: Teeth and Oropharynx as per pre-operative assessment

## 2019-04-08 NOTE — Op Note (Signed)
Preoperative diagnosis: Instability L4-5 L5 vertebral body fracture failed L4-5 interbody fusion  Postoperative diagnosis: Same  Procedure: #1 reexploration of L4-5 posterior lumbar interbody fusion with removal of L4-5 cortical screws and removal of bilateral interbody cages  2.  Redo decompressive laminectomy with removal of residual spinous process and redo foraminotomies of the L4 and L5 nerve roots bilaterally  3.  Pedicle screw fixation L3-S1 with the globus Creo modular pedicle screw set with 6.5 mm x 45 mm screws at L3 5.5 x 45 mm screws at L4 bilaterally a left-sided 4.5 x 40 mm screw at L5 and bilateral 6.5 x 35 mm bicortical screws at S1  4.  Posterior lateral arthrodesis L3-S1 using utilizing locally harvested autograft, BMP, and vivigen  Surgeon: Dominica Severin Laroy Mustard  Anesthesia: General  EBL: 400  HPI: Patient is a 78 year old female who underwent a L4-5 posterior lumbar interbody fusion about 3 weeks ago patient*developing worsening pain and was admitted to the hospital and worked up with serial imaging showing a progression of subsidence of an L5 cage and development of bilateral L5 pedicle fractures loosening of bilateral L5 screws and a fracture of the right L4 pedicle.  Due to patient's progression of clinical syndrome progressive failure and extension of her subluxation at L4-5 I recommended revision of spinal fusion with reexploration removal of hardware removal cages and pedicle screw fixation from L3-S1.  I extensively went over the risks and benefits of the operation with her as well as perioperative course expectations of outcome and alternatives of surgery and she understood and agreed to proceed forward.  Operative procedure: Patient brought into the OR was used in general anesthesia positioned prone on the Wilson frame her back was prepped and draped in routine sterile fashion.  Her old incision was opened up and extended cephalad caudally.  An incision was made cultures were  taken for the subcutaneous fluid and then I went subfascial and there was an extensive amount of fluid that was also cultured some of it had a milky white appearance around the loose left L5 cortical screw so I cultured this as well.  I then dissected out the transverse process and lateral facet joints from L3 down S1.  I then disconnected the knots rods and removed all 4 cortical screws placed.  3 of the 4 were completely loose and 1 of I removed as it was unable to work around it extend the decompression.  The spinous process have been left behind with the interspinous ligament intact and the first operation I removed all this so I could visualize and adequately decompress her.  Redo foraminotomies with extension removal of the fractured fragments off of the inferior pedicles of L4 and L5 bilaterally redo foraminotomies of the L4 and L5 nerve roots bilaterally went to the space cleaned out the inflammatory response and grumous bone graft that have migrated with the slip removed both cages cleaned out the disc space and due to the fracture and displacement I elected not to put any cages back into the space.  Then under fluoroscopic guidance utilizing residual pedicle anatomy at L4 I was able to replace 2 pedicle screws working around the cortical screw hole at L4 bilaterally I was not able to put a screw at L5 on the right do that pedicle being completely disrupted.  I was able to replace a left-sided small L5 pedicle screw.  I then placed new screws at L3 and S1 bilaterally under fluoroscopy.  All screws had excellent purchase and fluoroscopy confirmed  good position of all the implants.  Then at this point the wound was copiously irrigated meticulous hemostasis was maintained aggressive decortication was carried out the TPs and lateral gutters from L3 down to S1.  I packed the lateral gutters with BMP and the locally harvested autograft mix assembled the heads tightened all the rods down placed a cross-link placed  some cortical fiber boats posterior laterally as well.  Reinspected the current frame and a to confirm patency leg Gelfoam in the dura injected Exparel in the fascia and placed a large Hemovac drain.  The wound was then closed with interrupted Vicryl running 4 subcuticular Dermabond benzoin Steri-Strips and a sterile dressing.  At the end the case on needle count sponge counts were correct.

## 2019-04-08 NOTE — Progress Notes (Signed)
Patient ID: Tiffany Gill, female   DOB: Aug 29, 1941, 78 y.o.   MRN: [id number] Patient overall is stable still complaining of severe back pain denies any leg pain or radicular symptoms  Strength is 5/5 but any movement any assessment creates severe back pain.  Patient status post L4-5 decompression fusion with progressive subsidence and fracture of pedicles and severe spinal stenosis.  Due to patient's failure of fusion progression of clinical syndrome imaging findings I recommended revision of L4-5 posterior lumbar fusion.  I extensively went over the risks and benefits of the procedure with her as well as her husband as well as perioperative course expectations of outcome and alternatives to surgery and they understand and agree to proceed forward.

## 2019-04-09 NOTE — Progress Notes (Signed)
Orthopedic Tech Progress Note Patient Details:  Tiffany Gill 1941-03-18 [id number] Called in brace order Patient ID: [name], female   DOB: [birth year], 78 y.o.   MRN: [id number]   [name] 04/09/2019, 8:57 AM

## 2019-04-09 NOTE — Evaluation (Addendum)
Physical Therapy Re- Evaluation Patient Details Name: Tiffany Gill MRN: [id number] DOB: [birth year] Today's Date: 04/09/2019   History of Present Illness  Pt is a 78 y/o female admitted secondary to worsening back pain and LE pain following recent PLIF. PMH includes anxiety, and HTN. Pt s/p revision L4-5 posterior lumbar fusion, pedicle screw fixation L3-S1 7/14.   Clinical Impression  Pt re-evaluated s/p redo posterior lumbar fusion. Still requiring two person maximal assist for bed mobility. Unable to sit edge of bed for more than 5 minutes due to pain. Ice applied to address edema. Goals updated. Continue to recommend SNF for ongoing Physical Therapy.       Follow Up Recommendations SNF    Equipment Recommendations  Other (comment)(defer)    Recommendations for Other Services       Precautions / Restrictions Precautions Precautions: Back;Fall Precaution Booklet Issued: No Required Braces or Orthoses: Spinal Brace Spinal Brace: Lumbar corset;Applied in sitting position Restrictions Weight Bearing Restrictions: No      Mobility  Bed Mobility Overal bed mobility: Needs Assistance Bed Mobility: Rolling;Sidelying to Sit;Sit to Sidelying Rolling: +2 for physical assistance;Mod assist Sidelying to sit: Max assist;+2 for physical assistance;+2 for safety/equipment     Sit to sidelying: +2 for physical assistance;+2 for safety/equipment;Mod assist General bed mobility comments: cues for bending contralateral knee, reaching for bed rail, turning head. MaxA + 2 for BLE negotiation and trunk support  Transfers                    Ambulation/Gait                Stairs            Wheelchair Mobility    Modified Rankin (Stroke Patients Only)       Balance Overall balance assessment: Needs assistance Sitting-balance support: Bilateral upper extremity supported;Feet supported Sitting balance-Leahy Scale: Poor Sitting balance - Comments: posterior  bias                                     Pertinent Vitals/Pain Pain Assessment: Faces Faces Pain Scale: Hurts worst Pain Location: surgical site with all movement Pain Descriptors / Indicators: Grimacing;Guarding;Moaning;Sharp;Shooting;Aching Pain Intervention(s): Monitored during session;Limited activity within patient's tolerance    Home Living Family/patient expects to be discharged to:: Private residence Living Arrangements: Spouse/significant other Available Help at Discharge: Family;Available 24 hours/day Type of Home: House Home Access: Stairs to enter Entrance Stairs-Rails: Can reach both Entrance Stairs-Number of Steps: 3 Home Layout: Two level;Full bath on main level;Able to live on main level with bedroom/bathroom Home Equipment: Gilford Rile - 2 wheels      Prior Function Level of Independence: Needs assistance   Gait / Transfers Assistance Needed: ambulating with RW  ADL's / Homemaking Assistance Needed: requiring assist with donning/doffing brace and LB dressing as pt iwth LB weakness.        Hand Dominance   Dominant Hand: Right    Extremity/Trunk Assessment   Upper Extremity Assessment Upper Extremity Assessment: Defer to OT evaluation    Lower Extremity Assessment Lower Extremity Assessment: Generalized weakness    Cervical / Trunk Assessment Cervical / Trunk Assessment: Other exceptions Cervical / Trunk Exceptions: s/p redo L4-5 PLIF  Communication   Communication: No difficulties  Cognition Arousal/Alertness: Awake/alert Behavior During Therapy: WFL for tasks assessed/performed Overall Cognitive Status: Impaired/Different from baseline Area of Impairment: Awareness;Memory;Problem solving;Safety/judgement  Memory: Decreased recall of precautions   Safety/Judgement: Decreased awareness of safety;Decreased awareness of deficits Awareness: Intellectual Problem Solving: Decreased initiation;Difficulty  sequencing;Requires verbal cues;Requires tactile cues;Slow processing General Comments: Less anxious today with movement, slow processing, decreased initiation      General Comments      Exercises     Assessment/Plan    PT Assessment Patient needs continued PT services  PT Problem List Decreased strength;Decreased activity tolerance;Decreased balance;Decreased mobility;Decreased knowledge of use of DME;Decreased safety awareness;Decreased knowledge of precautions;Decreased cognition;Pain;Decreased range of motion       PT Treatment Interventions DME instruction;Gait training;Functional mobility training;Stair training;Therapeutic activities;Therapeutic exercise;Patient/family education;Balance training    PT Goals (Current goals can be found in the Care Plan section)  Acute Rehab PT Goals Patient Stated Goal: less pain PT Goal Formulation: With patient Time For Goal Achievement: 04/23/19 Potential to Achieve Goals: Fair    Frequency Min 3X/week   Barriers to discharge        Co-evaluation PT/OT/SLP Co-Evaluation/Treatment: Yes Reason for Co-Treatment: For patient/therapist safety;To address functional/ADL transfers PT goals addressed during session: Mobility/safety with mobility         AM-PAC PT "6 Clicks" Mobility  Outcome Measure Help needed turning from your back to your side while in a flat bed without using bedrails?: Total Help needed moving from lying on your back to sitting on the side of a flat bed without using bedrails?: Total Help needed moving to and from a bed to a chair (including a wheelchair)?: Total Help needed standing up from a chair using your arms (e.g., wheelchair or bedside chair)?: Total Help needed to walk in hospital room?: Total Help needed climbing 3-5 steps with a railing? : Total 6 Click Score: 6    End of Session Equipment Utilized During Treatment: Gait belt;Back brace Activity Tolerance: Patient limited by pain Patient left: in  bed;with call bell/phone within reach Nurse Communication: Mobility status PT Visit Diagnosis: Pain;Other symptoms and signs involving the nervous system (R29.898);Muscle weakness (generalized) (M62.81);Difficulty in walking, not elsewhere classified (R26.2)    Time: 9741-6384 PT Time Calculation (min) (ACUTE ONLY): 23 min   Charges:   PT Evaluation $PT Re-evaluation: 1 Re-eval         Ellamae Sia, PT, DPT Acute Rehabilitation Services Pager [phone] Office [phone]   Willy Eddy 04/09/2019, 12:24 PM

## 2019-04-09 NOTE — NC FL2 (Signed)
[place] MEDICAID FL2 LEVEL OF CARE SCREENING TOOL     IDENTIFICATION  Patient Name: Tiffany Gill Birthdate: 06-11-1941 Sex: female Admission Date (Current Location): 03/24/2019  [facility] and [place] Number:  Herbalist and Address:  [facility], [place] [address] [place], [place] [address]      Provider Number: [id number]  Attending Physician Name and Address:  Kary Kos, MD  Relative Name and Phone Number:  Chamaine Stankus; husband; [phone]    Current Level of Care: Hospital Recommended Level of Care: [place] Prior Approval Number:    Date Approved/Denied:   PASRR Number: pending  Discharge Plan: SNF    Current Diagnoses: Patient Active Problem List   Diagnosis Date Noted  . Spondylisthesis 04/08/2019  . Back pain   . Anxiety and depression   . Chronic pain syndrome   . Acute blood loss anemia   . Stage 3 chronic kidney disease ([place])   . Spondylolisthesis at L4-L5 level 03/17/2019    Orientation RESPIRATION BLADDER Height & Weight     Situation, Time, Self, Place(some memory loss)  Normal Continent Weight: 123 lb 10.9 oz (56.1 kg) Height:  5' (152.4 cm)  BEHAVIORAL SYMPTOMS/MOOD NEUROLOGICAL BOWEL NUTRITION STATUS      Continent Diet(see discharge summary)  AMBULATORY STATUS COMMUNICATION OF NEEDS Skin   Extensive Assist Verbally Surgical wounds, Skin abrasions(incision on back with honeycomb dressing; skin tear on right head with gauze dressing (both PRN dressing changes))                       Personal Care Assistance Level of Assistance  Bathing, Dressing, Feeding Bathing Assistance: Maximum assistance Feeding assistance: Independent Dressing Assistance: Maximum assistance     Functional Limitations Info  Sight, Hearing, Speech Sight Info: Adequate Hearing Info: Adequate Speech Info: Adequate    SPECIAL CARE FACTORS FREQUENCY  PT (By licensed PT), OT (By licensed OT)     PT  Frequency: 5x week OT Frequency: 5x week            Contractures Contractures Info: Not present    Additional Factors Info  Code Status, Allergies, Psychotropic Code Status Info: Full Code Allergies Info: Metronidazole Psychotropic Info: ARIPiprazole (ABILIFY) tablet 2 mg daily PO; buPROPion (WELLBUTRIN XL) 24 hr tablet 300 mg daily PO; DULoxetine (CYMBALTA) DR capsule 60 mg daily PO         Current Medications (04/09/2019):  This is the current hospital active medication list Current Facility-Administered Medications  Medication Dose Route Frequency Provider Last Rate Last Dose  . 0.9 %  sodium chloride infusion  250 mL Intravenous Continuous Kary Kos, MD 10 mL/hr at 04/09/19 1200    . acetaminophen (TYLENOL) tablet 650 mg  650 mg Oral Q4H PRN Kary Kos, MD       Or  . acetaminophen (TYLENOL) suppository 650 mg  650 mg Rectal Q4H PRN Kary Kos, MD      . acetaminophen (TYLENOL) tablet 650 mg  650 mg Oral Q6H WA Judith Part, MD   650 mg at 04/09/19 1316  . alum & mag hydroxide-simeth (MAALOX/MYLANTA) 200-200-20 MG/5ML suspension 30 mL  30 mL Oral Q6H PRN Kary Kos, MD      . amLODipine (NORVASC) tablet 5 mg  5 mg Oral Daily Mariel Aloe, MD   5 mg at 04/09/19 0929  . ARIPiprazole (ABILIFY) tablet 2 mg  2 mg Oral Daily Kary Kos, MD   2  mg at 04/09/19 0929  . bupivacaine liposome (EXPAREL) 1.3 % injection 266 mg  20 mL Infiltration Once Kary Kos, MD      . buPROPion (WELLBUTRIN XL) 24 hr tablet 300 mg  300 mg Oral Daily Kary Kos, MD   300 mg at 04/09/19 0928  . ceFAZolin (ANCEF) IVPB 2g/100 mL premix  2 g Intravenous Q8H Kary Kos, MD 200 mL/hr at 04/09/19 1316 2 g at 04/09/19 1316  . cholecalciferol (VITAMIN D3) tablet 1,000 Units  1,000 Units Oral Daily Kary Kos, MD   1,000 Units at 04/09/19 0929  . clonazePAM (KLONOPIN) tablet 0.5 mg  0.5 mg Oral TID PRN Kary Kos, MD   0.5 mg at 04/09/19 0205  . cyclobenzaprine (FLEXERIL) tablet 10 mg  10 mg Oral TID PRN  Kary Kos, MD   10 mg at 04/09/19 0205  . DULoxetine (CYMBALTA) DR capsule 60 mg  60 mg Oral Daily Kary Kos, MD   60 mg at 04/09/19 0928  . feeding supplement (ENSURE ENLIVE) (ENSURE ENLIVE) liquid 237 mL  237 mL Oral BID BM Kary Kos, MD   237 mL at 04/09/19 0931  . feeding supplement (PRO-STAT SUGAR FREE 64) liquid 30 mL  30 mL Oral Daily Kary Kos, MD   30 mL at 04/09/19 0928  . heparin injection 5,000 Units  5,000 Units Subcutaneous Q8H Judith Part, MD   5,000 Units at 04/09/19 1316  . HYDROcodone-acetaminophen (NORCO) 10-325 MG per tablet 1 tablet  1 tablet Oral Q6H PRN Kary Kos, MD      . HYDROmorphone (DILAUDID) injection 0.5 mg  0.5 mg Intravenous Q2H PRN Kary Kos, MD      . lactated ringers infusion   Intravenous Continuous Effie Berkshire, MD   Stopped at 04/08/19 1802  . levothyroxine (SYNTHROID) tablet 100 mcg  100 mcg Oral QAC breakfast Kary Kos, MD   100 mcg at 04/09/19 0544  . lidocaine (LIDODERM) 5 % 1 patch  1 patch Transdermal Q24H Judith Part, MD   1 patch at 04/09/19 1208  . losartan (COZAAR) tablet 50 mg  50 mg Oral Daily Kary Kos, MD   50 mg at 04/09/19 0929  . menthol-cetylpyridinium (CEPACOL) lozenge 3 mg  1 lozenge Oral PRN Kary Kos, MD       Or  . phenol (CHLORASEPTIC) mouth spray 1 spray  1 spray Mouth/Throat PRN Kary Kos, MD      . methocarbamol (ROBAXIN) tablet 500 mg  500 mg Oral Q6H PRN Eleonore Chiquito, NP   500 mg at 04/07/19 1218  . ondansetron (ZOFRAN) tablet 4 mg  4 mg Oral Q6H PRN Kary Kos, MD       Or  . ondansetron [facility]) injection 4 mg  4 mg Intravenous Q6H PRN Kary Kos, MD      . oxyCODONE (Oxy IR/ROXICODONE) immediate release tablet 10 mg  10 mg Oral Q3H PRN Kary Kos, MD   10 mg at 04/09/19 1316  . pantoprazole (PROTONIX) injection 40 mg  40 mg Intravenous QHS Kary Kos, MD   40 mg at 04/08/19 2119  . polyethylene glycol (MIRALAX / GLYCOLAX) packet 17 g  17 g Oral Daily PRN Kary Kos, MD   17 g at 04/06/19  1726  . pravastatin (PRAVACHOL) tablet 40 mg  40 mg Oral QPM Kary Kos, MD   40 mg at 04/07/19 1657  . predniSONE (DELTASONE) tablet 10 mg  10 mg Oral Q breakfast Kary Kos, MD  10 mg at 04/09/19 0929  . sodium chloride flush (NS) 0.9 % injection 10-40 mL  10-40 mL Intracatheter Q12H Kary Kos, MD   10 mL at 04/09/19 0931  . sodium chloride flush (NS) 0.9 % injection 10-40 mL  10-40 mL Intracatheter PRN Kary Kos, MD   10 mL at 04/02/19 2239  . sodium chloride flush (NS) 0.9 % injection 3 mL  3 mL Intravenous Q12H Kary Kos, MD   3 mL at 04/09/19 0931  . sodium chloride flush (NS) 0.9 % injection 3 mL  3 mL Intravenous PRN Kary Kos, MD         Discharge Medications: Please see discharge summary for a list of discharge medications.  Relevant Imaging Results:  Relevant Lab Results:   Additional Information SS# [place], [place]

## 2019-04-09 NOTE — Care Management Important Message (Signed)
Important Message  Patient Details  Name: Tiffany Gill MRN: [id number] Date of Birth: 27-Mar-1941   Medicare Important Message Given:  Yes     Memory Argue 04/09/2019, 11:34 AM

## 2019-04-09 NOTE — Evaluation (Signed)
Occupational Therapy Re-Evaluation Patient Details Name: Tiffany Gill MRN: [id number] DOB: 07/16/1941 Today's Date: 04/09/2019    History of Present Illness Pt is a 78 y/o female admitted secondary to worsening back pain and LE pain following recent PLIF. PMH includes anxiety, and HTN. Pt s/p revision L4-5 posterior lumbar fusion, pedicle screw fixation L3-S1 7/14.    Clinical Impression   Pt seen for re-evaluation s/p revision of lumbar fusion. Pt continues to have limitations due to pain. She tolerated bed mobility but was only able to sit EOB for a short period of time; requires two person assist for completion of bed mobility. Given current pain levels pt requiring overall totalA for majority of ADL tasks. Continue to recommend SNF level therapy services at time of discharge to progress pt's safety and independence with ADL and mobility. Will follow.     Follow Up Recommendations  SNF    Equipment Recommendations  Other (comment)(TBD in next venue)           Precautions / Restrictions Precautions Precautions: Back;Fall Precaution Booklet Issued: No Required Braces or Orthoses: Spinal Brace Spinal Brace: Lumbar corset;Applied in sitting position Restrictions Weight Bearing Restrictions: No      Mobility Bed Mobility Overal bed mobility: Needs Assistance Bed Mobility: Rolling;Sidelying to Sit;Sit to Sidelying Rolling: +2 for physical assistance;Mod assist Sidelying to sit: Max assist;+2 for physical assistance;+2 for safety/equipment     Sit to sidelying: +2 for physical assistance;+2 for safety/equipment;Mod assist General bed mobility comments: cues for bending contralateral knee, reaching for bed rail, turning head. MaxA + 2 for BLE negotiation and trunk support  Transfers                      Balance Overall balance assessment: Needs assistance Sitting-balance support: Bilateral upper extremity supported;Feet supported Sitting balance-Leahy Scale:  Poor Sitting balance - Comments: posterior bias                                   ADL either performed or assessed with clinical judgement   ADL Overall ADL's : Needs assistance/impaired                     Lower Body Dressing: Total assistance;Bed level               Functional mobility during ADLs: Maximal assistance;+2 for physical assistance;+2 for safety/equipment General ADL Comments: pt only able to tolerate bed mobility, sitting EOB for short period of time; spouse present during session, both pt and spouse report MD states pt should "take it slow"      Vision         Perception     Praxis      Pertinent Vitals/Pain Pain Assessment: Faces Faces Pain Scale: Hurts worst Pain Location: surgical site with all movement Pain Descriptors / Indicators: Grimacing;Guarding;Moaning;Sharp;Shooting;Aching Pain Intervention(s): Monitored during session;Limited activity within patient's tolerance;Repositioned     Hand Dominance Right   Extremity/Trunk Assessment Upper Extremity Assessment Upper Extremity Assessment: Generalized weakness   Lower Extremity Assessment Lower Extremity Assessment: Defer to PT evaluation   Cervical / Trunk Assessment Cervical / Trunk Assessment: Other exceptions Cervical / Trunk Exceptions: s/p redo L4-5 PLIF   Communication Communication Communication: No difficulties   Cognition Arousal/Alertness: Awake/alert Behavior During Therapy: WFL for tasks assessed/performed Overall Cognitive Status: Impaired/Different from baseline Area of Impairment: Awareness;Memory;Problem solving;Safety/judgement  Memory: Decreased recall of precautions   Safety/Judgement: Decreased awareness of safety;Decreased awareness of deficits Awareness: Intellectual Problem Solving: Decreased initiation;Difficulty sequencing;Requires verbal cues;Requires tactile cues;Slow processing General Comments: Less  anxious today with movement, slow processing, decreased initiation   General Comments       Exercises     Shoulder Instructions      Home Living Family/patient expects to be discharged to:: Private residence Living Arrangements: Spouse/significant other Available Help at Discharge: Family;Available 24 hours/day Type of Home: House Home Access: Stairs to enter [employer] of Steps: 3 Entrance Stairs-Rails: Can reach both Home Layout: Two level;Full bath on main level;Able to live on main level with bedroom/bathroom Alternate Level Stairs-Number of Steps: full flight Alternate Level Stairs-Rails: Can reach both Bathroom Shower/Tub: Walk-in shower;Door   [employer] Toilet: Standard     Home Equipment: Environmental consultant - 2 wheels          Prior Functioning/Environment Level of Independence: Needs assistance  Gait / Transfers Assistance Needed: ambulating with RW ADL's / Homemaking Assistance Needed: requiring assist with donning/doffing brace and LB dressing as pt iwth LB weakness.            OT Problem List: Decreased strength;Decreased activity tolerance;Impaired balance (sitting and/or standing);Decreased coordination;Decreased cognition;Decreased safety awareness;Pain      OT Treatment/Interventions: Self-care/ADL training;Therapeutic exercise;Neuromuscular education;Energy conservation;DME and/or AE instruction;Therapeutic activities;Patient/family education;Balance training    OT Goals(Current goals can be found in the care plan section) Acute Rehab OT Goals Patient Stated Goal: less pain OT Goal Formulation: With patient/family Time For Goal Achievement: 04/21/19 Potential to Achieve Goals: Fair  OT Frequency: Min 2X/week   Barriers to D/C:            Co-evaluation PT/OT/SLP Co-Evaluation/Treatment: Yes Reason for Co-Treatment: For patient/therapist safety;To address functional/ADL transfers PT goals addressed during session: Mobility/safety with  mobility OT goals addressed during session: Strengthening/ROM;ADL's and self-care      AM-PAC OT "6 Clicks" Daily Activity     Outcome Measure Help from another person eating meals?: A Lot Help from another person taking care of personal grooming?: Total Help from another person toileting, which includes using toliet, bedpan, or urinal?: Total Help from another person bathing (including washing, rinsing, drying)?: Total Help from another person to put on and taking off regular upper body clothing?: Total Help from another person to put on and taking off regular lower body clothing?: Total 6 Click Score: 7   End of Session Nurse Communication: Mobility status  Activity Tolerance: Patient limited by pain Patient left: in bed;with call bell/phone within reach;with family/visitor present  OT Visit Diagnosis: Unsteadiness on feet (R26.81);Muscle weakness (generalized) (M62.81);Pain Pain - part of body: (back)                Time: 2979-8921 OT Time Calculation (min): 23 min Charges:  OT General Charges $OT Visit: 1 Visit OT Evaluation $OT Re-eval: 1 Re-eval  Lou Cal, OT Supplemental Rehabilitation Services Pager [phone] Office [place] 04/09/2019, 1:47 PM

## 2019-04-09 NOTE — Anesthesia Postprocedure Evaluation (Signed)
Anesthesia Post Note  Patient: Tiffany Gill  Procedure(s) Performed: Re-exploration of Lumbar four-five Posterior Lumbar interbody Fusion, Removal bilateral Lumbar four-Lumbar five cortical screws, Removal of bilateral cages Lumbar four-five, Pedicle screw fixation Lumbar three-Sacral one, Posterolateral arthrodesis Lumbar three-Sacral one (N/A Back)     Patient location during evaluation: PACU Anesthesia Type: General Level of consciousness: awake and alert Pain management: pain level controlled Vital Signs Assessment: post-procedure vital signs reviewed and stable Respiratory status: spontaneous breathing, nonlabored ventilation, respiratory function stable and patient connected to nasal cannula oxygen Cardiovascular status: blood pressure returned to baseline and stable Postop Assessment: no apparent nausea or vomiting Anesthetic complications: no    Last Vitals:  Vitals:   04/09/19 0323 04/09/19 0828  BP: 106/63 128/65  Pulse: 96 97  Resp: 14 13  Temp: 37 C 37.3 C  SpO2: 100% 91%    Last Pain:  Vitals:   04/09/19 0828  TempSrc: Axillary  PainSc:                  Devonta Blanford S

## 2019-04-10 LAB — CBC WITH DIFFERENTIAL/PLATELET
Abs Immature Granulocytes: 0.16 10*3/uL — ABNORMAL HIGH (ref 0.00–0.07)
Basophils Absolute: 0 10*3/uL (ref 0.0–0.1)
Basophils Relative: 0 %
Eosinophils Absolute: 0.2 10*3/uL (ref 0.0–0.5)
Eosinophils Relative: 2 %
HCT: 28 % — ABNORMAL LOW (ref 36.0–46.0)
Hemoglobin: 8.9 g/dL — ABNORMAL LOW (ref 12.0–15.0)
Immature Granulocytes: 1 %
Lymphocytes Relative: 10 %
Lymphs Abs: 1.3 10*3/uL (ref 0.7–4.0)
MCH: 32.4 pg (ref 26.0–34.0)
MCHC: 31.8 g/dL (ref 30.0–36.0)
MCV: 101.8 fL — ABNORMAL HIGH (ref 80.0–100.0)
Monocytes Absolute: 0.8 10*3/uL (ref 0.1–1.0)
Monocytes Relative: 6 %
Neutro Abs: 10 10*3/uL — ABNORMAL HIGH (ref 1.7–7.7)
Neutrophils Relative %: 81 %
Platelets: 217 10*3/uL (ref 150–400)
RBC: 2.75 MIL/uL — ABNORMAL LOW (ref 3.87–5.11)
RDW: 14.4 % (ref 11.5–15.5)
WBC: 12.4 10*3/uL — ABNORMAL HIGH (ref 4.0–10.5)
nRBC: 0 % (ref 0.0–0.2)

## 2019-04-10 LAB — BASIC METABOLIC PANEL
Anion gap: 13 (ref 5–15)
BUN: 20 mg/dL (ref 8–23)
CO2: 18 mmol/L — ABNORMAL LOW (ref 22–32)
Calcium: 8.7 mg/dL — ABNORMAL LOW (ref 8.9–10.3)
Chloride: 104 mmol/L (ref 98–111)
Creatinine, Ser: 1.29 mg/dL — ABNORMAL HIGH (ref 0.44–1.00)
GFR calc Af Amer: 46 mL/min — ABNORMAL LOW (ref >60)
GFR calc non Af Amer: 40 mL/min — ABNORMAL LOW (ref >60)
Glucose, Bld: 135 mg/dL — ABNORMAL HIGH (ref 70–99)
Potassium: 3.9 mmol/L (ref 3.5–5.1)
Sodium: 135 mmol/L (ref 135–145)

## 2019-04-10 LAB — AEROBIC CULTURE W GRAM STAIN (SUPERFICIAL SPECIMEN)
Culture: NO GROWTH
Culture: NO GROWTH

## 2019-04-10 MED ORDER — PANTOPRAZOLE SODIUM 40 MG PO TBEC
40.0000 mg | DELAYED_RELEASE_TABLET | Freq: Every day | ORAL | Status: DC
  Administered 2019-04-10 – 2019-04-13 (×4): 40 mg via ORAL
  Filled 2019-04-10 (×4): qty 1

## 2019-04-10 NOTE — TOC Progression Note (Signed)
Transition of Care [facility]) - Progression Note    Patient Details  Name: KAHDIJAH ERRICKSON MRN: [id number] Date of Birth: 1940-10-24  Transition of Care [facility]) CM/SW [place], [place] Phone Number: 04/10/2019, 5:41 PM  Clinical Narrative:     CSW visit with the patient at bedside. CSW received consult for discharge needs. CSW spoke with patient regarding PT recommendation of ST rehab at SNF at time of discharge. Patient states only she and her spouse in the home. Patient agrees with PT recommendations of ST rehab before returning home. Patient states she has never been to rehab at [facility] and has no preference. CSW explained the SNF process and will provide patient with placement options once available.    Patient is realistic regarding therapy needs and expressed being hopeful for SNF placement. Patient expressed understanding of CSW role and discharge process as well as medical condition. No questions/concerns about plan or treatment at this time.  Thurmond Butts, MSW, [facility] Clinical Social Worker [phone]    Expected Discharge Plan: Skilled Nursing Facility Barriers to Discharge: SNF Pending bed offer  Expected Discharge Plan and Services Expected Discharge Plan: [place] In-house Referral: Clinical Social Work     Living arrangements for the past 2 months: Single Family Home                                       Social Determinants of Health (SDOH) Interventions    Readmission Risk Interventions No flowsheet data found.

## 2019-04-10 NOTE — Progress Notes (Signed)
Subjective: Patient reports Denies any pain did have a bowel movement last night  Objective: Vital signs in last 24 hours: Temp:  [97.6 F (36.4 C)-99.4 F (37.4 C)] 97.6 F (36.4 C) (07/16 0546) Pulse Rate:  [86-100] 88 (07/16 0546) Resp:  [13-29] 20 (07/16 0546) BP: (100-128)/(53-84) 115/72 (07/16 0546) SpO2:  [91 %-96 %] 95 % (07/16 0546)  Intake/Output from previous day: 07/15 0701 - 07/16 0700 In: 467 [P.O.:120; I.V.:147; IV Piggyback:200] Out: 60 [Drains:60] Intake/Output this shift: No intake/output data recorded.  Awake alert strength 5-5 intermittently she has evidence of maybe a slight left foot drop but this is been intermittent throughout her hospitalization I do not think it is any worse today.  Lab Results: Recent Labs    04/07/19 1224  WBC 9.8  HGB 12.7  HCT 37.7  PLT 310   BMET Recent Labs    04/07/19 1224  NA 134*  K 4.1  CL 101  CO2 21*  GLUCOSE 109*  BUN 16  CREATININE 1.13*  CALCIUM 9.5    Studies/Results: Dg Lumbar Spine 2-3 Views  Result Date: 04/08/2019 CLINICAL DATA:  Exploration and revision of spinal fusion. EXAM: DG C-ARM 61-120 MIN; LUMBAR SPINE - 2-3 VIEW COMPARISON:  Lumbar spine CT dated 04/07/2019. FINDINGS: One lateral and 2 PA C-arm views of the lumbar spine were obtained. The previously demonstrated anterolisthesis at the L3-4 level has been reduced. And interbody cage remains in place at the L4-5 level pedicle screws bilaterally at the L4 and L5 levels. The previously demonstrated fixation rods are no longer demonstrated. IMPRESSION: Operative changes, as described above. Electronically Signed   By: Claudie Revering M.D.   On: 04/08/2019 17:06   Dg C-arm 1-60 Min  Result Date: 04/08/2019 CLINICAL DATA:  Exploration and revision of spinal fusion. EXAM: DG C-ARM 61-120 MIN; LUMBAR SPINE - 2-3 VIEW COMPARISON:  Lumbar spine CT dated 04/07/2019. FINDINGS: One lateral and 2 PA C-arm views of the lumbar spine were obtained. The previously  demonstrated anterolisthesis at the L3-4 level has been reduced. And interbody cage remains in place at the L4-5 level pedicle screws bilaterally at the L4 and L5 levels. The previously demonstrated fixation rods are no longer demonstrated. IMPRESSION: Operative changes, as described above. Electronically Signed   By: Claudie Revering M.D.   On: 04/08/2019 17:06    Assessment/Plan: Postop day 2 lumbar revision doing well pain much better from preop continue to mobilize with physical therapy.  Will check screening electrolytes  LOS: 17 days     Lucy Woolever P 04/10/2019, 7:52 AM

## 2019-04-11 NOTE — Progress Notes (Signed)
Nutrition Follow-up  DOCUMENTATION CODES:   Not applicable  INTERVENTION:  Continue Ensure Enlive po BID, each supplement provides 350 kcal and 20 grams of protein  Continue 30 ml Prostat po once daily., each supplement provides 100 kcal and 15 grams of protein.   Encourage adequate PO intake.   NUTRITION DIAGNOSIS:   Increased nutrient needs related to post-op healing as evidenced by estimated needs; ongoing  GOAL:   Patient will meet greater than or equal to 90% of their needs; met  MONITOR:   PO intake, Supplement acceptance, Skin, Weight trends, Labs, I & O's  REASON FOR ASSESSMENT:   Malnutrition Screening Tool    ASSESSMENT:   78 year old with worsening back pain status post lumbar fusion admitted for pain management and workup. Work-up has shown mild endplate fracture with subsidence of 1 of her cages stenosis being created by postoperative fluid collection and slight deformity related to fracture.  Procedure (7/14) reexploration of L4-5 posterior lumbar interbody fusion with removal of L4-5 cortical screws and removal of bilateral interbody cages   Meal completion has been varied from 40-100%. Pain has improved. Intake at meals improving. Pt currently has Ensure and Prostat ordered and has been consuming them. RD to continue with current orders to aid in adequate caloric and protein needs as well as in post op healing.   Labs and medications reviewed.   Diet Order:   Diet Order            Diet regular Room service appropriate? Yes with Assist; Fluid consistency: Thin  Diet effective now              EDUCATION NEEDS:   Not appropriate for education at this time  Skin:  Skin Assessment: Skin Integrity Issues: Skin Integrity Issues:: Incisions Incisions: back  Last BM:  7/16  Height:   Ht Readings from Last 1 Encounters:  04/08/19 5' (1.524 m)    Weight:   Wt Readings from Last 1 Encounters:  04/08/19 56.1 kg    Ideal Body Weight:  45.45  kg  BMI:  Body mass index is 24.15 kg/m.  Estimated Nutritional Needs:   Kcal:  1650-1800  Protein:  75-90 grams  Fluid:  1.6- 1.8 L/day    Corrin Parker, MS, RD, LDN Pager # [phone] After hours/ weekend pager # [phone]

## 2019-04-11 NOTE — Progress Notes (Signed)
Subjective: Patient reports Doing well significant improvement in pain  Objective: Vital signs in last 24 hours: Temp:  [97.6 F (36.4 C)-98.5 F (36.9 C)] 97.6 F (36.4 C) (07/17 1317) Pulse Rate:  [29-91] 85 (07/17 0822) Resp:  [20] 20 (07/16 1523) BP: (128-129)/(49-53) 129/53 (07/16 2021) SpO2:  [94 %-97 %] 95 % (07/17 0822)  Intake/Output from previous day: 07/16 0701 - 07/17 0700 In: 559 [P.O.:360; I.V.:99; IV Piggyback:100] Out: 460 [Urine:400; Drains:60] Intake/Output this shift: Total I/O In: 480 [P.O.:480] Out: 200 [Urine:200]  Strength 5-5 wound clean dry and intact intermittent slight weakness of left foot  Lab Results: Recent Labs    04/10/19 0824  WBC 12.4*  HGB 8.9*  HCT 28.0*  PLT 217   BMET Recent Labs    04/10/19 0824  NA 135  K 3.9  CL 104  CO2 18*  GLUCOSE 135*  BUN 20  CREATININE 1.29*  CALCIUM 8.7*    Studies/Results: No results found.  Assessment/Plan: Continue physical Occupational Therapy patient seems like she might be a better candidate for inpatient rehab here account and then skilled nursing facility but defer to PT OT.  LOS: 18 days     Dynastie Knoop P 04/11/2019, 2:01 PM

## 2019-04-11 NOTE — Progress Notes (Signed)
Physical Therapy Treatment Patient Details Name: Tiffany Gill MRN: [id number] DOB: 1941/08/10 Today's Date: 04/11/2019    History of Present Illness Pt is a 78 y/o female admitted secondary to worsening back pain and LE pain following recent PLIF. PMH includes anxiety, and HTN. Pt s/p revision L4-5 posterior lumbar fusion, pedicle screw fixation L3-S1 7/14.     PT Comments    Pt with seemingly improved pain control with increased activity tolerance (premedicated prior to session). Pt excited to get to the chair today. Requiring two person maximal assist to stand pivot from bed to chair with walker. Other part of session focused on bed level lower extremity strengthening. Pt with more RLE weakness in comparison to LLE. Continue to recommend SNF for ongoing Physical Therapy.       Follow Up Recommendations  SNF     Equipment Recommendations  Other (comment)(defer)    Recommendations for Other Services       Precautions / Restrictions Precautions Precautions: Back;Fall Precaution Booklet Issued: No Required Braces or Orthoses: Spinal Brace Spinal Brace: Lumbar corset;Applied in sitting position Restrictions Weight Bearing Restrictions: No    Mobility  Bed Mobility Overal bed mobility: Needs Assistance Bed Mobility: Rolling;Sidelying to Sit Rolling: Min assist Sidelying to sit: Max assist;+2 for physical assistance       General bed mobility comments: Rolling to left side with cues for bending contralateral knee, reaching with arm for bed rails, minA to facilitate. MaxA + 2 for sidelying > sit with assist for legs off edge of bed and trunk elevation  Transfers Overall transfer level: Needs assistance Equipment used: Rolling walker (2 wheeled) Transfers: Sit to/from [facility] Sit to Stand: Mod assist;+2 physical assistance Stand pivot transfers: Max assist;+2 physical assistance       General transfer comment: ModA + 2 to stand from edge of bed,  maxA + 2 to pivot towards left side from bed to recliner. Very narrow BOS, posterior lean, cues for motor initiation/coordination   Ambulation/Gait                 Stairs             Wheelchair Mobility    Modified Rankin (Stroke Patients Only)       Balance Overall balance assessment: Needs assistance Sitting-balance support: Bilateral upper extremity supported;Feet supported Sitting balance-Leahy Scale: Poor Sitting balance - Comments: posterior bias and left lateral lean                                    Cognition Arousal/Alertness: Awake/alert Behavior During Therapy: WFL for tasks assessed/performed Overall Cognitive Status: Impaired/Different from baseline Area of Impairment: Awareness;Memory;Problem solving;Safety/judgement                     Memory: Decreased recall of precautions   Safety/Judgement: Decreased awareness of safety;Decreased awareness of deficits Awareness: Intellectual Problem Solving: Decreased initiation;Difficulty sequencing;Requires verbal cues;Requires tactile cues;Slow processing General Comments: Excited to get to the chair today, needs max cues for sequencing/problem solving      Exercises General Exercises - Lower Extremity Ankle Circles/Pumps: Both;20 reps;Supine Quad Sets: Both;15 reps;Supine Short Arc Quad: Both;10 reps;Supine Heel Slides: Both;10 reps;Supine Hip ABduction/ADduction: AAROM;Both;10 reps;Supine    General Comments        Pertinent Vitals/Pain Pain Assessment: Faces Faces Pain Scale: Hurts even more Pain Location: surgical site bed mobility, R hip with SLR's Pain Descriptors /  Indicators: Grimacing;Guarding;Sharp;Shooting;Aching Pain Intervention(s): Monitored during session;Premedicated before session    Home Living                      Prior Function            PT Goals (current goals can now be found in the care plan section) Acute Rehab PT Goals Patient  Stated Goal: less pain PT Goal Formulation: With patient Time For Goal Achievement: 04/23/19 Potential to Achieve Goals: Fair Progress towards PT goals: Progressing toward goals    Frequency    Min 3X/week      PT Plan Current plan remains appropriate    Co-evaluation              AM-PAC PT "6 Clicks" Mobility   Outcome Measure  Help needed turning from your back to your side while in a flat bed without using bedrails?: A Little Help needed moving from lying on your back to sitting on the side of a flat bed without using bedrails?: Total Help needed moving to and from a bed to a chair (including a wheelchair)?: Total Help needed standing up from a chair using your arms (e.g., wheelchair or bedside chair)?: Total Help needed to walk in hospital room?: Total Help needed climbing 3-5 steps with a railing? : Total 6 Click Score: 8    End of Session Equipment Utilized During Treatment: Gait belt;Back brace Activity Tolerance: Patient tolerated treatment well Patient left: in bed;with call bell/phone within reach Nurse Communication: Mobility status PT Visit Diagnosis: Pain;Other symptoms and signs involving the nervous system (R29.898);Muscle weakness (generalized) (M62.81);Difficulty in walking, not elsewhere classified (R26.2)     Time: 5956-3875 PT Time Calculation (min) (ACUTE ONLY): 27 min  Charges:  $Gait Training: 8-22 mins $Therapeutic Exercise: 8-22 mins                     Ellamae Sia, PT, DPT Acute Rehabilitation Services Pager [phone] Office [phone]    Willy Eddy 04/11/2019, 9:38 AM

## 2019-04-11 NOTE — Plan of Care (Signed)

## 2019-04-11 NOTE — Progress Notes (Signed)
Inpatient Rehabilitation Admissions Coordinator  Asked by SW to re eval patient for a possible inpt rehab admit per pt's husband and Dr. Windy Carina request. Dr. Posey Pronto and I consulted on 7/6. I will follow up on Monday with her progress with therapy to assist with planning venue options.  Danne Baxter, RN, MSN Rehab Admissions Coordinator [phone] 04/11/2019 3:49 PM

## 2019-04-11 NOTE — TOC Progression Note (Signed)
Transition of Care [facility]) - Progression Note    Patient Details  Name: Tiffany Gill MRN: [id number] Date of Birth: 1941/03/22  Transition of Care [facility]) CM/SW [place], [place] Phone Number: 04/11/2019, 3:43 PM  Clinical Narrative:     CSW visit with the patient at bedside, along with her husband. CSW provided SNF bed offers. Patient's spouse inquired about SNF  Visitor's policy ( no visitors) and states that is not his preference. Patient's spouse requested that be re-evaluated by CIR and to discuss the outcome with him.   CSW contacted CIR/Barbara - informed of patient's request   Thurmond Butts, MSW, [facility] Clinical Social Worker [phone]   Expected Discharge Plan: [place] Barriers to Discharge: SNF Pending bed offer  Expected Discharge Plan and Services Expected Discharge Plan: [place] In-house Referral: Clinical Social Work     Living arrangements for the past 2 months: Single Family Home                                       Social Determinants of Health (SDOH) Interventions    Readmission Risk Interventions No flowsheet data found.

## 2019-04-12 LAB — SARS CORONAVIRUS 2 BY RT PCR (HOSPITAL ORDER, PERFORMED IN ~~LOC~~ HOSPITAL LAB): SARS Coronavirus 2: NEGATIVE

## 2019-04-12 MED ORDER — BISACODYL 5 MG PO TBEC
5.0000 mg | DELAYED_RELEASE_TABLET | Freq: Every day | ORAL | Status: DC | PRN
  Administered 2019-04-12: 5 mg via ORAL
  Filled 2019-04-12: qty 1

## 2019-04-12 MED ORDER — BISACODYL 10 MG RE SUPP
10.0000 mg | Freq: Every day | RECTAL | Status: DC | PRN
  Administered 2019-04-12: 10 mg via RECTAL
  Filled 2019-04-12: qty 1

## 2019-04-12 NOTE — Progress Notes (Signed)
Subjective: Patient reports some mild back pain but nothing intolerable.   Objective: Vital signs in last 24 hours: Temp:  [97.4 F (36.3 C)-98.6 F (37 C)] 98.5 F (36.9 C) (07/18 0735) Pulse Rate:  [84-95] 84 (07/18 0503) Resp:  [2-22] 18 (07/18 0503) BP: (109-139)/(53-96) 124/57 (07/18 0735) SpO2:  [94 %-98 %] 97 % (07/18 0503)  Intake/Output from previous day: 07/17 0701 - 07/18 0700 In: 480 [P.O.:480] Out: 800 [Urine:800] Intake/Output this shift: No intake/output data recorded.  Neurologic: Grossly normal  Lab Results: Lab Results  Component Value Date   WBC 12.4 (H) 04/10/2019   HGB 8.9 (L) 04/10/2019   HCT 28.0 (L) 04/10/2019   MCV 101.8 (H) 04/10/2019   PLT 217 04/10/2019   No results found for: INR, PROTIME BMET Lab Results  Component Value Date   NA 135 04/10/2019   K 3.9 04/10/2019   CL 104 04/10/2019   CO2 18 (L) 04/10/2019   GLUCOSE 135 (H) 04/10/2019   BUN 20 04/10/2019   CREATININE 1.29 (H) 04/10/2019   CALCIUM 8.7 (L) 04/10/2019    Studies/Results: No results found.  Assessment/Plan: Continue therapies, awaiting inpatient rehab consult Monday.   LOS: 19 days    Tiffany Gill Tiffany Gill 04/12/2019, 8:06 AM

## 2019-04-13 LAB — ANAEROBIC CULTURE
Culture: NO GROWTH
Culture: NO GROWTH

## 2019-04-13 NOTE — Progress Notes (Signed)
Subjective: Patient reports Doing well really no pain this morning  Objective: Vital signs in last 24 hours: Temp:  [97.7 F (36.5 C)-97.9 F (36.6 C)] 97.9 F (36.6 C) (07/19 0400) Resp:  [16] 16 (07/18 1223) BP: (108-136)/(69-82) 108/70 (07/19 0400)  Intake/Output from previous day: 07/18 0701 - 07/19 0700 In: 723 [P.O.:720; I.V.:3] Out: 751 [Urine:750; Stool:1] Intake/Output this shift: No intake/output data recorded.  Strength stable 5-5 slight weakness left foot baseline  Lab Results: Recent Labs    04/10/19 0824  WBC 12.4*  HGB 8.9*  HCT 28.0*  PLT 217   BMET Recent Labs    04/10/19 0824  NA 135  K 3.9  CL 104  CO2 18*  GLUCOSE 135*  BUN 20  CREATININE 1.29*  CALCIUM 8.7*    Studies/Results: No results found.  Assessment/Plan: Doing very well continue physical occupational therapy work on placement Monday or Tuesday hopefully inpatient rehab  LOS: 20 days     Tiffany Gill P 04/13/2019, 7:53 AM

## 2019-04-14 ENCOUNTER — Inpatient Hospital Stay (HOSPITAL_COMMUNITY)
Admission: RE | Admit: 2019-04-14 | Discharge: 2019-05-04 | DRG: 560 | Disposition: A | Discharge status: Home Health | Payer: Medicare Other | Source: Intra-hospital | Attending: Physical Medicine & Rehabilitation | Admitting: Physical Medicine & Rehabilitation

## 2019-04-14 ENCOUNTER — Encounter (HOSPITAL_COMMUNITY): Payer: Self-pay | Admitting: *Deleted

## 2019-04-14 ENCOUNTER — Other Ambulatory Visit: Payer: Self-pay

## 2019-04-14 DIAGNOSIS — M21372 Foot drop, left foot: Secondary | ICD-10-CM | POA: Diagnosis present

## 2019-04-14 DIAGNOSIS — M25551 Pain in right hip: Secondary | ICD-10-CM | POA: Diagnosis present

## 2019-04-14 DIAGNOSIS — Z4789 Encounter for other orthopedic aftercare: Secondary | ICD-10-CM | POA: Diagnosis not present

## 2019-04-14 DIAGNOSIS — F411 Generalized anxiety disorder: Secondary | ICD-10-CM

## 2019-04-14 DIAGNOSIS — M4326 Fusion of spine, lumbar region: Secondary | ICD-10-CM | POA: Diagnosis not present

## 2019-04-14 DIAGNOSIS — F19921 Other psychoactive substance use, unspecified with intoxication with delirium: Secondary | ICD-10-CM

## 2019-04-14 DIAGNOSIS — Z981 Arthrodesis status: Secondary | ICD-10-CM

## 2019-04-14 DIAGNOSIS — F332 Major depressive disorder, recurrent severe without psychotic features: Secondary | ICD-10-CM | POA: Diagnosis not present

## 2019-04-14 DIAGNOSIS — G479 Sleep disorder, unspecified: Secondary | ICD-10-CM | POA: Diagnosis present

## 2019-04-14 DIAGNOSIS — R0989 Other specified symptoms and signs involving the circulatory and respiratory systems: Secondary | ICD-10-CM

## 2019-04-14 DIAGNOSIS — M79604 Pain in right leg: Secondary | ICD-10-CM | POA: Diagnosis not present

## 2019-04-14 DIAGNOSIS — G8918 Other acute postprocedural pain: Secondary | ICD-10-CM

## 2019-04-14 DIAGNOSIS — T8131XA Disruption of external operation (surgical) wound, not elsewhere classified, initial encounter: Secondary | ICD-10-CM | POA: Diagnosis not present

## 2019-04-14 DIAGNOSIS — N179 Acute kidney failure, unspecified: Secondary | ICD-10-CM | POA: Diagnosis present

## 2019-04-14 DIAGNOSIS — N183 Chronic kidney disease, stage 3 unspecified: Secondary | ICD-10-CM

## 2019-04-14 DIAGNOSIS — R410 Disorientation, unspecified: Secondary | ICD-10-CM

## 2019-04-14 DIAGNOSIS — Y831 Surgical operation with implant of artificial internal device as the cause of abnormal reaction of the patient, or of later complication, without mention of misadventure at the time of the procedure: Secondary | ICD-10-CM | POA: Diagnosis not present

## 2019-04-14 DIAGNOSIS — Z8249 Family history of ischemic heart disease and other diseases of the circulatory system: Secondary | ICD-10-CM

## 2019-04-14 DIAGNOSIS — M5416 Radiculopathy, lumbar region: Secondary | ICD-10-CM | POA: Diagnosis present

## 2019-04-14 DIAGNOSIS — I129 Hypertensive chronic kidney disease with stage 1 through stage 4 chronic kidney disease, or unspecified chronic kidney disease: Secondary | ICD-10-CM | POA: Diagnosis present

## 2019-04-14 DIAGNOSIS — M4316 Spondylolisthesis, lumbar region: Secondary | ICD-10-CM | POA: Diagnosis present

## 2019-04-14 DIAGNOSIS — F419 Anxiety disorder, unspecified: Secondary | ICD-10-CM

## 2019-04-14 DIAGNOSIS — F331 Major depressive disorder, recurrent, moderate: Secondary | ICD-10-CM

## 2019-04-14 DIAGNOSIS — T380X5A Adverse effect of glucocorticoids and synthetic analogues, initial encounter: Secondary | ICD-10-CM | POA: Diagnosis not present

## 2019-04-14 DIAGNOSIS — R5381 Other malaise: Secondary | ICD-10-CM | POA: Diagnosis present

## 2019-04-14 DIAGNOSIS — R52 Pain, unspecified: Secondary | ICD-10-CM | POA: Diagnosis not present

## 2019-04-14 DIAGNOSIS — T8131XS Disruption of external operation (surgical) wound, not elsewhere classified, sequela: Secondary | ICD-10-CM | POA: Diagnosis not present

## 2019-04-14 DIAGNOSIS — E039 Hypothyroidism, unspecified: Secondary | ICD-10-CM | POA: Diagnosis present

## 2019-04-14 DIAGNOSIS — N390 Urinary tract infection, site not specified: Secondary | ICD-10-CM | POA: Diagnosis present

## 2019-04-14 DIAGNOSIS — D62 Acute posthemorrhagic anemia: Secondary | ICD-10-CM | POA: Diagnosis present

## 2019-04-14 DIAGNOSIS — I10 Essential (primary) hypertension: Secondary | ICD-10-CM

## 2019-04-14 DIAGNOSIS — R8290 Unspecified abnormal findings in urine: Secondary | ICD-10-CM | POA: Diagnosis not present

## 2019-04-14 DIAGNOSIS — Z823 Family history of stroke: Secondary | ICD-10-CM

## 2019-04-14 DIAGNOSIS — F05 Delirium due to known physiological condition: Secondary | ICD-10-CM | POA: Diagnosis not present

## 2019-04-14 MED ORDER — FLEET ENEMA 7-19 GM/118ML RE ENEM: 1.0000 | ENEMA | Freq: Once | RECTAL | Status: DC | PRN

## 2019-04-14 MED ORDER — ENSURE ENLIVE PO LIQD
237.0000 mL | Freq: Two times a day (BID) | ORAL | Status: DC
  Administered 2019-04-15 – 2019-05-03 (×25): 237 mL via ORAL

## 2019-04-14 MED ORDER — LOSARTAN POTASSIUM 50 MG PO TABS
50.0000 mg | ORAL_TABLET | Freq: Every day | ORAL | Status: DC
  Administered 2019-04-15 – 2019-05-04 (×20): 50 mg via ORAL
  Filled 2019-04-14 (×20): qty 1

## 2019-04-14 MED ORDER — MENTHOL 3 MG MT LOZG
1.0000 | LOZENGE | OROMUCOSAL | Status: DC | PRN
  Filled 2019-04-14: qty 9

## 2019-04-14 MED ORDER — LIDOCAINE 5 % EX PTCH
1.0000 | MEDICATED_PATCH | CUTANEOUS | Status: DC
  Administered 2019-04-15 – 2019-04-25 (×11): 1 via TRANSDERMAL
  Filled 2019-04-14 (×11): qty 1

## 2019-04-14 MED ORDER — POLYETHYLENE GLYCOL 3350 17 G PO PACK
17.0000 g | PACK | Freq: Every day | ORAL | Status: DC | PRN
  Administered 2019-04-20 – 2019-04-29 (×2): 17 g via ORAL
  Filled 2019-04-14 (×3): qty 1

## 2019-04-14 MED ORDER — VITAMIN D 25 MCG (1000 UNIT) PO TABS
1000.0000 [IU] | ORAL_TABLET | Freq: Every day | ORAL | Status: DC
  Administered 2019-04-15 – 2019-05-04 (×20): 1000 [IU] via ORAL
  Filled 2019-04-14 (×20): qty 1

## 2019-04-14 MED ORDER — PANTOPRAZOLE SODIUM 40 MG PO TBEC
40.0000 mg | DELAYED_RELEASE_TABLET | Freq: Every day | ORAL | Status: DC
  Administered 2019-04-14 – 2019-05-03 (×20): 40 mg via ORAL
  Filled 2019-04-14 (×20): qty 1

## 2019-04-14 MED ORDER — AMLODIPINE BESYLATE 5 MG PO TABS
5.0000 mg | ORAL_TABLET | Freq: Every day | ORAL | Status: DC
  Administered 2019-04-15 – 2019-05-04 (×20): 5 mg via ORAL
  Filled 2019-04-14 (×19): qty 1
  Filled 2019-04-14: qty 2

## 2019-04-14 MED ORDER — DULOXETINE HCL 30 MG PO CPEP
60.0000 mg | ORAL_CAPSULE | Freq: Every day | ORAL | Status: DC
  Administered 2019-04-15 – 2019-05-04 (×20): 60 mg via ORAL
  Filled 2019-04-14: qty 2
  Filled 2019-04-14: qty 1
  Filled 2019-04-14: qty 2
  Filled 2019-04-14: qty 1
  Filled 2019-04-14 (×4): qty 2
  Filled 2019-04-14: qty 1
  Filled 2019-04-14 (×6): qty 2
  Filled 2019-04-14: qty 1
  Filled 2019-04-14 (×5): qty 2

## 2019-04-14 MED ORDER — ONDANSETRON HCL 4 MG PO TABS: 4.0000 mg | ORAL_TABLET | Freq: Four times a day (QID) | ORAL | Status: DC | PRN

## 2019-04-14 MED ORDER — POLYETHYLENE GLYCOL 3350 17 G PO PACK: 17.0000 g | PACK | Freq: Every day | ORAL | Status: DC | PRN

## 2019-04-14 MED ORDER — ONDANSETRON HCL 4 MG/2ML IJ SOLN: 4.0000 mg | Freq: Four times a day (QID) | INTRAMUSCULAR | Status: DC | PRN

## 2019-04-14 MED ORDER — HYDROCODONE-ACETAMINOPHEN 5-325 MG PO TABS: 1.0000 | ORAL_TABLET | Freq: Four times a day (QID) | ORAL | 0 refills | Status: DC | PRN

## 2019-04-14 MED ORDER — PROCHLORPERAZINE MALEATE 5 MG PO TABS: 5.0000 mg | ORAL_TABLET | Freq: Four times a day (QID) | ORAL | Status: DC | PRN

## 2019-04-14 MED ORDER — ALUM & MAG HYDROXIDE-SIMETH 200-200-20 MG/5ML PO SUSP: 30.0000 mL | ORAL | Status: DC | PRN

## 2019-04-14 MED ORDER — HEPARIN SODIUM (PORCINE) 5000 UNIT/ML IJ SOLN
5000.0000 [IU] | Freq: Three times a day (TID) | INTRAMUSCULAR | Status: DC
  Administered 2019-04-14 – 2019-05-04 (×55): 5000 [IU] via SUBCUTANEOUS
  Filled 2019-04-14 (×58): qty 1

## 2019-04-14 MED ORDER — BUPROPION HCL ER (XL) 300 MG PO TB24
300.0000 mg | ORAL_TABLET | Freq: Every day | ORAL | Status: DC
  Administered 2019-04-15 – 2019-05-04 (×20): 300 mg via ORAL
  Filled 2019-04-14 (×20): qty 1

## 2019-04-14 MED ORDER — PREDNISONE 10 MG PO TABS
10.0000 mg | ORAL_TABLET | Freq: Every day | ORAL | Status: DC
  Administered 2019-04-15 – 2019-04-22 (×8): 10 mg via ORAL
  Filled 2019-04-14: qty 1
  Filled 2019-04-14: qty 2
  Filled 2019-04-14: qty 1
  Filled 2019-04-14: qty 2
  Filled 2019-04-14 (×2): qty 1
  Filled 2019-04-14 (×2): qty 2

## 2019-04-14 MED ORDER — ALUM & MAG HYDROXIDE-SIMETH 200-200-20 MG/5ML PO SUSP: 30.0000 mL | Freq: Four times a day (QID) | ORAL | Status: DC | PRN

## 2019-04-14 MED ORDER — ARIPIPRAZOLE 2 MG PO TABS
2.0000 mg | ORAL_TABLET | Freq: Every day | ORAL | Status: DC
  Administered 2019-04-15 – 2019-05-04 (×20): 2 mg via ORAL
  Filled 2019-04-14 (×20): qty 1

## 2019-04-14 MED ORDER — DIPHENHYDRAMINE HCL 12.5 MG/5ML PO ELIX: 12.5000 mg | ORAL_SOLUTION | Freq: Four times a day (QID) | ORAL | Status: DC | PRN

## 2019-04-14 MED ORDER — PHENOL 1.4 % MT LIQD: 1.0000 | OROMUCOSAL | Status: DC | PRN

## 2019-04-14 MED ORDER — CYCLOBENZAPRINE HCL 10 MG PO TABS: 10.0000 mg | ORAL_TABLET | Freq: Three times a day (TID) | ORAL | 0 refills | Status: DC | PRN

## 2019-04-14 MED ORDER — CLONAZEPAM 0.5 MG PO TABS
0.5000 mg | ORAL_TABLET | Freq: Three times a day (TID) | ORAL | Status: DC | PRN
  Administered 2019-04-17 – 2019-04-22 (×3): 0.5 mg via ORAL
  Filled 2019-04-14 (×3): qty 1

## 2019-04-14 MED ORDER — ACETAMINOPHEN 325 MG PO TABS
325.0000 mg | ORAL_TABLET | ORAL | Status: DC | PRN
  Administered 2019-04-16 – 2019-05-01 (×12): 650 mg via ORAL
  Filled 2019-04-14 (×13): qty 2

## 2019-04-14 MED ORDER — BISACODYL 10 MG RE SUPP: 10.0000 mg | Freq: Every day | RECTAL | Status: DC | PRN

## 2019-04-14 MED ORDER — OXYCODONE HCL 5 MG PO TABS
10.0000 mg | ORAL_TABLET | ORAL | Status: DC | PRN
  Administered 2019-04-15 – 2019-05-04 (×63): 10 mg via ORAL
  Filled 2019-04-14 (×67): qty 2

## 2019-04-14 MED ORDER — PRAVASTATIN SODIUM 40 MG PO TABS
40.0000 mg | ORAL_TABLET | Freq: Every evening | ORAL | Status: DC
  Administered 2019-04-14 – 2019-05-03 (×19): 40 mg via ORAL
  Filled 2019-04-14 (×20): qty 1

## 2019-04-14 MED ORDER — LEVOTHYROXINE SODIUM 100 MCG PO TABS
100.0000 ug | ORAL_TABLET | Freq: Every day | ORAL | Status: DC
  Administered 2019-04-15 – 2019-05-04 (×20): 100 ug via ORAL
  Filled 2019-04-14 (×20): qty 1

## 2019-04-14 MED ORDER — PRO-STAT SUGAR FREE PO LIQD
30.0000 mL | Freq: Every day | ORAL | Status: DC
  Administered 2019-04-21 – 2019-05-03 (×10): 30 mL via ORAL
  Filled 2019-04-14 (×18): qty 30

## 2019-04-14 MED ORDER — GUAIFENESIN-DM 100-10 MG/5ML PO SYRP: 5.0000 mL | ORAL_SOLUTION | Freq: Four times a day (QID) | ORAL | Status: DC | PRN

## 2019-04-14 MED ORDER — HYDROCODONE-ACETAMINOPHEN 10-325 MG PO TABS
1.0000 | ORAL_TABLET | Freq: Four times a day (QID) | ORAL | Status: DC | PRN
  Administered 2019-04-14 – 2019-04-16 (×3): 1 via ORAL
  Filled 2019-04-14 (×3): qty 1

## 2019-04-14 MED ORDER — CYCLOBENZAPRINE HCL 5 MG PO TABS
5.0000 mg | ORAL_TABLET | Freq: Three times a day (TID) | ORAL | Status: DC | PRN
  Administered 2019-04-17 – 2019-04-18 (×2): 5 mg via ORAL
  Filled 2019-04-14 (×2): qty 1

## 2019-04-14 MED ORDER — DEPLIN 15 15-90.314 MG PO CAPS: 15.0000 mg | ORAL_CAPSULE | Freq: Every day | ORAL | Status: DC

## 2019-04-14 MED ORDER — PROCHLORPERAZINE 25 MG RE SUPP
12.5000 mg | Freq: Four times a day (QID) | RECTAL | Status: DC | PRN
  Filled 2019-04-14: qty 1

## 2019-04-14 MED ORDER — PROCHLORPERAZINE EDISYLATE 10 MG/2ML IJ SOLN: 5.0000 mg | Freq: Four times a day (QID) | INTRAMUSCULAR | Status: DC | PRN

## 2019-04-14 MED ORDER — TRAZODONE HCL 50 MG PO TABS
25.0000 mg | ORAL_TABLET | Freq: Every evening | ORAL | Status: DC | PRN
  Administered 2019-04-23: 50 mg via ORAL
  Filled 2019-04-14: qty 1

## 2019-04-14 NOTE — PMR Pre-admission (Addendum)
PMR Admission Coordinator Pre-Admission Assessment  Patient: Tiffany Gill is an 78 y.o., female MRN: [id number] DOB: 13-Dec-1940 Height: 5' (152.4 cm) Weight: 56.1 kg              Insurance Information HMO:     PPO:      PCP:      IPA:      80/20:      OTHER: no HMO PRIMARY: Medicare a and b      Policy#: [id number]      Subscriber: pt Benefits:  Phone #: passport one online     Name: 7/20 Eff. Date: a 11/24/2006 b 05/27/2007     Deduct: $1408      Out of Pocket Max: none      Life Max: none CIR: 100%      SNF: 20 full days Outpatient: 80%     Co-Pay: 20% Home Health: 100%      Co-Pay: none DME: 80%     Co-Pay: 20% Providers: pt choice  SECONDARY: BCBS supplement      Policy#: [id number]      Subscriber: pt  Medicaid Application Date:       Case Manager:  Disability Application Date:       Case Worker:   The "Data Collection Information Summary" for patients in Inpatient Rehabilitation Facilities with attached "Privacy Act [facility] [place] Records" was provided and verbally reviewed with: Patient and Family  Emergency Contact Information Contact Information    Name Relation Home Work Mobile   Ildefonso,Tiffany Gill [phone]       Current Medical History  Patient Admitting Diagnosis: lumbar spondylolisthesis with radiculopathy  History of Present Illness: 78 y.o. female with history of anxiety, depression s/p ECT,memory loss, chronic pain, lumbar spondylolisthesis with radiculopathy s/p decompressive laminectomy L4/5 by Dr. Saintclair Halsted on 03/18/2019.  She was discharged to home the next day and has had progressive worsening of LBP with bilateral hip pain. She was seen at [facility] office and X rays showed concerns of cage displacement. She was admitted 03/24/19 for work up and CT spine done revealing subsidence of right spacer into superior end plate of L5 with remaining hardware in satisfactory position.  She was treated with Decadron with improvement in pain, however developed  agitation, which was thought to be due to Neurontin.  Per reports, patient had fall on to left hip PTA and CT hip 03/28/2019 showed no acute changes or focal fluid collection.  She was found on the floor 03/29/2019 with posterior head laceration and CT head/neck done for work up.  CT head did not show any acute intracranial abnormalities. Orientation waxing and waning with medication changes made to decrease causes.Gill approved to visit to assist with reorientation.  Further imaging reviewed by Dr. Saintclair Halsted. On 7/14 patient underwent reexploration of L4-5 posterior lumbar interbody fusion with removal of L4-5 cortical screws and removal of bilateral interbody cages.   Past Medical History  Past Medical History:  Diagnosis Date  . Anxiety   . Gait disorder   . High cholesterol   . Hypertension   . Hypothyroidism   . Major depression in partial remission ([place])   . Memory loss   . Osteoarthritis     Family History  family history includes Heart disease in her brother and father; Stroke in her mother.  Prior Rehab/Hospitalizations:  Has the patient had prior rehab or hospitalizations prior to admission? Yes  Has the patient had major surgery during 100 days prior to admission? Yes  Current Medications   Current Facility-Administered Medications:  .  0.9 %  sodium chloride infusion, 250 mL, Intravenous, Continuous, Kary Kos, MD, Last Rate: 11 mL/hr at 04/10/19 0800 .  acetaminophen (TYLENOL) tablet 650 mg, 650 mg, Oral, Q4H PRN **OR** acetaminophen (TYLENOL) suppository 650 mg, 650 mg, Rectal, Q4H PRN, Kary Kos, MD .  acetaminophen (TYLENOL) tablet 650 mg, 650 mg, Oral, Q6H WA, Judith Part, MD, 650 mg at 04/14/19 1419 .  alum & mag hydroxide-simeth (MAALOX/MYLANTA) 200-200-20 MG/5ML suspension 30 mL, 30 mL, Oral, Q6H PRN, Kary Kos, MD, 30 mL at 04/09/19 1634 .  amLODipine (NORVASC) tablet 5 mg, 5 mg, Oral, Daily, Mariel Aloe, MD, 5 mg at 04/14/19 0811 .  ARIPiprazole  (ABILIFY) tablet 2 mg, 2 mg, Oral, Daily, Kary Kos, MD, 2 mg at 04/14/19 0932 .  bisacodyl (DULCOLAX) EC tablet 5 mg, 5 mg, Oral, Daily PRN, Meyran, [name], NP, 5 mg at 04/12/19 1331 .  bisacodyl (DULCOLAX) suppository 10 mg, 10 mg, Rectal, Daily PRN, Meyran, [name], NP, 10 mg at 04/12/19 2032 .  bupivacaine liposome (EXPAREL) 1.3 % injection 266 mg, 20 mL, Infiltration, Once, Kary Kos, MD .  buPROPion (WELLBUTRIN XL) 24 hr tablet 300 mg, 300 mg, Oral, Daily, Kary Kos, MD, 300 mg at 04/14/19 0810 .  cholecalciferol (VITAMIN D3) tablet 1,000 Units, 1,000 Units, Oral, Daily, Kary Kos, MD, 1,000 Units at 04/14/19 [phone] .  clonazePAM (KLONOPIN) tablet 0.5 mg, 0.5 mg, Oral, TID PRN, Kary Kos, MD, 0.5 mg at 04/13/19 2057 .  cyclobenzaprine (FLEXERIL) tablet 10 mg, 10 mg, Oral, TID PRN, Kary Kos, MD, 10 mg at 04/13/19 2054 .  DULoxetine (CYMBALTA) DR capsule 60 mg, 60 mg, Oral, Daily, Kary Kos, MD, 60 mg at 04/14/19 0811 .  feeding supplement (ENSURE ENLIVE) (ENSURE ENLIVE) liquid 237 mL, 237 mL, Oral, BID BM, Kary Kos, MD, 237 mL at 04/14/19 0932 .  feeding supplement (PRO-STAT SUGAR FREE 64) liquid 30 mL, 30 mL, Oral, Daily, Kary Kos, MD, 30 mL at 04/14/19 0812 .  heparin injection 5,000 Units, 5,000 Units, Subcutaneous, Q8H, Judith Part, MD, 5,000 Units at 04/14/19 0542 .  HYDROcodone-acetaminophen (NORCO) 10-325 MG per tablet 1 tablet, 1 tablet, Oral, Q6H PRN, Kary Kos, MD, 1 tablet at 04/14/19 1045 .  HYDROmorphone (DILAUDID) injection 0.5 mg, 0.5 mg, Intravenous, Q2H PRN, Kary Kos, MD, 0.5 mg at 04/11/19 0840 .  lactated ringers infusion, , Intravenous, Continuous, Effie Berkshire, MD, Stopped at 04/08/19 1802 .  levothyroxine (SYNTHROID) tablet 100 mcg, 100 mcg, Oral, QAC breakfast, Kary Kos, MD, 100 mcg at 04/14/19 0542 .  lidocaine (LIDODERM) 5 % 1 patch, 1 patch, Transdermal, Q24H, Ostergard, Joyice Faster, MD, 1 patch at 04/14/19 1240 .  losartan  (COZAAR) tablet 50 mg, 50 mg, Oral, Daily, Kary Kos, MD, 50 mg at 04/14/19 6759 .  menthol-cetylpyridinium (CEPACOL) lozenge 3 mg, 1 lozenge, Oral, PRN **OR** phenol (CHLORASEPTIC) mouth spray 1 spray, 1 spray, Mouth/Throat, PRN, Kary Kos, MD .  methocarbamol (ROBAXIN) tablet 500 mg, 500 mg, Oral, Q6H PRN, Meyran, [name], NP, 500 mg at 04/13/19 2054 .  ondansetron (ZOFRAN) tablet 4 mg, 4 mg, Oral, Q6H PRN **OR** ondansetron (ZOFRAN) injection 4 mg, 4 mg, Intravenous, Q6H PRN, Kary Kos, MD .  oxyCODONE (Oxy IR/ROXICODONE) immediate release tablet 10 mg, 10 mg, Oral, Q3H PRN, Kary Kos, MD, 10 mg at 04/14/19 1238 .  pantoprazole (PROTONIX) EC tablet 40 mg, 40 mg, Oral, QHS, Rumbarger, Valeda Malm,  RPH, 40 mg at 04/13/19 2056 .  polyethylene glycol (MIRALAX / GLYCOLAX) packet 17 g, 17 g, Oral, Daily PRN, Kary Kos, MD, 17 g at 04/12/19 1116 .  pravastatin (PRAVACHOL) tablet 40 mg, 40 mg, Oral, QPM, Kary Kos, MD, 40 mg at 04/13/19 1641 .  predniSONE (DELTASONE) tablet 10 mg, 10 mg, Oral, Q breakfast, Kary Kos, MD, 10 mg at 04/14/19 0810 .  sodium chloride flush (NS) 0.9 % injection 10-40 mL, 10-40 mL, Intracatheter, Q12H, Kary Kos, MD, 10 mL at 04/14/19 6789 .  sodium chloride flush (NS) 0.9 % injection 10-40 mL, 10-40 mL, Intracatheter, PRN, Kary Kos, MD, 10 mL at 04/02/19 2239 .  sodium chloride flush (NS) 0.9 % injection 3 mL, 3 mL, Intravenous, Q12H, Kary Kos, MD, 3 mL at 04/14/19 0813 .  sodium chloride flush (NS) 0.9 % injection 3 mL, 3 mL, Intravenous, PRN, Kary Kos, MD  Patients Current Diet:  Diet Order            Diet - low sodium heart healthy        Diet regular Room service appropriate? Yes with Assist; Fluid consistency: Thin  Diet effective now              Precautions / Restrictions Precautions Precautions: Fall, Back Precaution Booklet Issued: No Precaution Comments: pt has no recall of back precautions Spinal Brace: Applied in sitting position,  Lumbar corset Restrictions Weight Bearing Restrictions: No   Has the patient had 2 or more falls or a fall with injury in the past year?Yes Fell in hospital with injury on 7/4 on [address]  Prior Activity Level Community (5-7x/wk): independent, driving, without AD prior to first surgery  Prior Functional Level Prior Function Level of Independence: Needs assistance Gait / Transfers Assistance Needed: ambulating with RW ADL's / Homemaking Assistance Needed: requiring assist with donning/doffing brace and LB dressing as pt iwth LB weakness. Comments: Had been using RW for mobility.   Self Care: Did the patient need help bathing, dressing, using the toilet or eating?  Independent  Indoor Mobility: Did the patient need assistance with walking from room to room (with or without device)? Independent  Stairs: Did the patient need assistance with internal or external stairs (with or without device)? Independent  Functional Cognition: Did the patient need help planning regular tasks such as shopping or remembering to take medications? [place] / [place] Devices/Equipment: [place] Rile (specify type) Home Equipment: Walker - 2 wheels  Prior Device Use: Indicate devices/aids used by the patient prior to current illness, exacerbation or injury? None of the above  Current Functional Level Cognition  Overall Cognitive Status: Impaired/Different from baseline Current Attention Level: Focused Orientation Level: Oriented X4 Following Commands: Follows one step commands consistently Safety/Judgement: Decreased awareness of safety General Comments: Excited to get to the chair today, needs max cues for sequencing/problem solving    Extremity Assessment (includes Sensation/Coordination)  Upper Extremity Assessment: Generalized weakness  Lower Extremity Assessment: Defer to PT evaluation LLE Deficits / Details: weakness    ADLs  Overall ADL's : Needs  assistance/impaired Eating/Feeding: Minimal assistance Eating/Feeding Details (indicate cue type and reason): pt continues to require tactile cues to minA for hand eye coordination for drinking task.  Grooming: Minimal assistance, Brushing hair Grooming Details (indicate cue type and reason): applying make up (tendency for posterior lean) Upper Body Bathing: Moderate assistance, Sitting Lower Body Bathing: Maximal assistance, Sitting/lateral leans, Sit to/from stand Lower Body Bathing Details (indicate cue  type and reason): unable to process LB AE even as a review from earlier this week, Upper Body Dressing : Moderate assistance, Sitting Upper Body Dressing Details (indicate cue type and reason): Pt doffing brace with  modA and wanting to lean back. Lower Body Dressing: Total assistance, Bed level Lower Body Dressing Details (indicate cue type and reason): Pt required max A and cues to iniate donning sock, but unable to follow through with activity, and unable to sustain attention   Toilet Transfer: Moderate assistance Toilet Transfer Details (indicate cue type and reason): Mod A +2  sit>stand with initial posterior lean, Mod A +2 stand pivot with small steps--both simulated from bed>recliner Toileting- Clothing Manipulation and Hygiene: Total assistance Toileting - Clothing Manipulation Details (indicate cue type and reason): Mod A +2 sit>stand and Mod A to maintain standing Functional mobility during ADLs: Maximal assistance, +2 for physical assistance, +2 for safety/equipment General ADL Comments: pt only able to tolerate bed mobility, sitting EOB for short period of time; Gill present during session, both pt and Gill report MD states pt should "take it slow"     Mobility  Overal bed mobility: Needs Assistance Bed Mobility: Rolling, Sidelying to Sit Rolling: Min guard Sidelying to sit: Min assist Supine to sit: Mod assist, +2 for physical assistance Sit to sidelying: +2 for physical  assistance, +2 for safety/equipment, Mod assist General bed mobility comments: Rolling to left side with cues for bending contralateral knee, reaching with arm for bed rails, minA to facilitate. MaxA + 2 for sidelying > sit with assist for legs off edge of bed and trunk elevation    Transfers  Overall transfer level: Needs assistance Equipment used: Rolling walker (2 wheeled) Transfers: Sit to/from Stand Sit to Stand: Mod assist, +2 physical assistance, Min assist Stand pivot transfers: Max assist, +2 physical assistance General transfer comment: intially lifting assist to stand, after three trials, closer to +2 min A for sit to stand from recliner    Ambulation / Gait / Stairs / Wheelchair Mobility  Ambulation/Gait Ambulation/Gait assistance: Min assist, +2 physical assistance, Mod assist Gait Distance (Feet): 5 Feet Assistive device: Rolling walker (2 wheeled) Gait Pattern/deviations: Step-to pattern, Decreased step length - left, Shuffle, Decreased dorsiflexion - left General Gait Details: attempting to scoot L foot on floor, when she picked it up it landed on the other foot Gait velocity: Decreased     Posture / Balance Dynamic Sitting Balance Sitting balance - Comments: cues for anterior weight shift and occasional min to minguard A while combing hair and applying lipstick Balance Overall balance assessment: Needs assistance Sitting-balance support: Bilateral upper extremity supported, Feet supported Sitting balance-Leahy Scale: Poor Sitting balance - Comments: cues for anterior weight shift and occasional min to minguard A while combing hair and applying lipstick Postural control: Posterior lean Standing balance support: Bilateral upper extremity supported Standing balance-Leahy Scale: Poor Standing balance comment: initially +2 for balance due to leaning back initially, then subsequent trials with standing pt with improved anterior weight shift    Special needs/care  consideration BiPAP/CPAP n/a CPM n/a Continuous Drip IV yes with implantable port to right chest Dialysis n/a Life Vest n/a Oxygen n/a Special Bed fall risk; low bed with floor pads Trach Size n/a Wound Vac n/a Skin surgical incisions Bowel mgmt: continent 7/20 Bladder mgmt: external catheter Diabetic mgmt n/a Behavioral consideration  Has been disoriented due to meds which is resolving. Gill, Tiffany Gill has been allowed visitation due to her severe confusion. He is aware that this  will be re assessed first 24 to 48 hrs at [facility] for continued need. Chemo/radiation n/a   Previous Home Environment  Living Arrangements: Gill/significant other  Lives With: Gill Available Help at Discharge: Family, Available 24 hours/day Type of Home: House Home Layout: Two level, Full bath on main level, Able to live on main level with bedroom/bathroom Alternate Level Stairs-Rails: Can reach both Alternate Level Stairs-Number of Steps: full flight Home Access: Stairs to enter Entrance Stairs-Rails: Can reach both Entrance Stairs-Number of Steps: 3 Bathroom Shower/Tub: Gaffer, Charity fundraiser: Standard Bathroom Accessibility: Yes How Accessible: Accessible via walker [place]: Yes Type of Home Care Services: Home PT, [place] (if known): [place] after first surgery  Discharge Living Setting Plans for Discharge Living Setting: Patient's home, Lives with (comment)(Gill) Type of Home at Discharge: House Discharge Home Layout: Two level, Full bath on main level, Able to live on main level with bedroom/bathroom Alternate Level Stairs-Rails: Right, Left, Can reach both Alternate Level Stairs-Number of Steps: flight Discharge Home Access: Stairs to enter Entrance Stairs-Rails: Right, Left, Can reach both Entrance Stairs-Number of Steps: 3 Discharge Bathroom Shower/Tub: Walk-in shower, Door Discharge Bathroom Toilet: Standard Discharge Bathroom Accessibility:  Yes How Accessible: Accessible via walker Does the patient have any problems obtaining your medications?: No  Social/Family/Support Systems Patient Roles: Gill Contact Information: Gill Tiffany Gill; cell phone receives text only, can leave voicemail on home land line Anticipated Caregiver: Gill and a firend coming from [name] to help once she is discharged Anticipated Caregiver's Contact Information: cell [phone] home [phone] Ability/Limitations of Caregiver: Gill with no limitations Caregiver Availability: 24/7 Discharge Plan Discussed with Primary Caregiver: Yes Is Caregiver In Agreement with Plan?: Yes Does Caregiver/Family have Issues with Lodging/Transportation while Pt is in Rehab?: No  Goals/Additional Needs Patient/Family Goal for Rehab: superivison to min PT, OT, and SLP Expected length of stay: ELOS 14 to 20 days Special Service Needs: Gill has been approved to visit on acute; he is aware that once admitted to CIR, that visitaion will be reassessed for need Pt/Family Agrees to Admission and willing to participate: Yes Program Orientation Provided & Reviewed with Pt/Caregiver Including Roles  & Responsibilities: Yes  Decrease burden of Care through IP rehab admission: n/a  Possible need for SNF placement upon discharge:not anticipated  Patient Condition: This patient's medical and functional status has changed since the consult dated 03/31/2019 in which the Rehabilitation Physician determined and documented that the patient was potentially appropriate for intensive rehabilitative care in an inpatient rehabilitation facility. Issues have been addressed and update has been discussed with Dr. Naaman Plummer and patient now appropriate for inpatient rehabilitation. Will admit to inpatient rehab today.   Preadmission Screen Completed By:  Cleatrice Burke, RN MSN, 04/14/2019 3:24 PM ______________________________________________________________________   Discussed  status with Dr. Naaman Plummer on 04/14/2019 at 1455 and received approval for admission today.  Admission Coordinator:  Cleatrice Burke RN MSN time 0626 Date 04/14/2019

## 2019-04-14 NOTE — Progress Notes (Signed)
Physical Therapy Treatment Patient Details Name: Tiffany Gill MRN: [id number] DOB: 14-Jan-1941 Today's Date: 04/14/2019    History of Present Illness Pt is a 78 y/o female admitted secondary to worsening back pain and LE pain following recent PLIF. PMH includes anxiety, and HTN. Pt s/p revision L4-5 posterior lumbar fusion, pedicle screw fixation L3-S1 7/14.     PT Comments    Patient progressing this session with improved LE strength with transfers and able to ambulate couple of steps over three trials with spouse in the room bringing recliner behind her.  Coordination deficits apparent in L LE and limited endurance with ambulation.  Feel with good family support she is a good CIR candidate.  PT to follow acutely until d/c.    Follow Up Recommendations  CIR     Equipment Recommendations  Other (comment)(TBA)    Recommendations for Other Services       Precautions / Restrictions Precautions Precautions: Fall;Back Required Braces or Orthoses: Spinal Brace Spinal Brace: Applied in sitting position;Lumbar corset    Mobility  Bed Mobility Overal bed mobility: Needs Assistance Bed Mobility: Rolling;Sidelying to Sit Rolling: Min guard Sidelying to sit: Min assist          Transfers Overall transfer level: Needs assistance Equipment used: Rolling walker (2 wheeled) Transfers: Sit to/from Stand Sit to Stand: Mod assist;+2 physical assistance;Min assist         General transfer comment: intially lifting assist to stand, after three trials, closer to +2 min A for sit to stand from recliner  Ambulation/Gait Ambulation/Gait assistance: Min assist;+2 physical assistance;Mod assist Gait Distance (Feet): 5 Feet Assistive device: Rolling walker (2 wheeled) Gait Pattern/deviations: Step-to pattern;Decreased step length - left;Shuffle;Decreased dorsiflexion - left     General Gait Details: attempting to scoot L foot on floor, when she picked it up it landed on the other  foot   Stairs             Wheelchair Mobility    Modified Rankin (Stroke Patients Only)       Balance Overall balance assessment: Needs assistance Sitting-balance support: Bilateral upper extremity supported;Feet supported Sitting balance-Leahy Scale: Poor Sitting balance - Comments: cues for anterior weight shift and occasional min to minguard A while combing hair and applying lipstick   Standing balance support: Bilateral upper extremity supported Standing balance-Leahy Scale: Poor Standing balance comment: initially +2 for balance due to leaning back initially, then subsequent trials with standing pt with improved anterior weight shift                            Cognition Arousal/Alertness: Awake/alert Behavior During Therapy: WFL for tasks assessed/performed Overall Cognitive Status: Impaired/Different from baseline Area of Impairment: Memory;Safety/judgement                   Current Attention Level: Focused   Following Commands: Follows one step commands consistently Safety/Judgement: Decreased awareness of safety            Exercises      General Comments General comments (skin integrity, edema, etc.): spouse in the room and encouraging      Pertinent Vitals/Pain Faces Pain Scale: Hurts a little bit Pain Location: back with mobility Pain Descriptors / Indicators: Sore Pain Intervention(s): Repositioned;Monitored during session;Premedicated before session    Home Living                      Prior Function  PT Goals (current goals can now be found in the care plan section) Progress towards PT goals: Progressing toward goals    Frequency    Min 3X/week      PT Plan Discharge plan needs to be updated    Co-evaluation PT/OT/SLP Co-Evaluation/Treatment: Yes Reason for Co-Treatment: To address functional/ADL transfers;For patient/therapist safety PT goals addressed during session: Mobility/safety  with mobility;Balance;Proper use of DME OT goals addressed during session: ADL's and self-care;Strengthening/ROM      AM-PAC PT "6 Clicks" Mobility   Outcome Measure  Help needed turning from your back to your side while in a flat bed without using bedrails?: A Little Help needed moving from lying on your back to sitting on the side of a flat bed without using bedrails?: A Little Help needed moving to and from a bed to a chair (including a wheelchair)?: Total Help needed standing up from a chair using your arms (e.g., wheelchair or bedside chair)?: Total Help needed to walk in hospital room?: Total Help needed climbing 3-5 steps with a railing? : Total 6 Click Score: 10    End of Session Equipment Utilized During Treatment: Gait belt;Back brace Activity Tolerance: Patient tolerated treatment well Patient left: in chair;with call bell/phone within reach;with chair alarm set;with family/visitor present   PT Visit Diagnosis: Other abnormalities of gait and mobility (R26.89);Muscle weakness (generalized) (M62.81);Other symptoms and signs involving the nervous system (R29.898)     Time: 1305-1330 PT Time Calculation (min) (ACUTE ONLY): 25 min  Charges:  $Therapeutic Activity: 8-22 mins                     Magda Kiel, PT Acute Rehabilitation Services [phone] 04/14/2019    Tiffany Gill 04/14/2019, 2:54 PM

## 2019-04-14 NOTE — Progress Notes (Signed)
RN called the husband to request to bring a home medicine Deplin 15 mg. Pt's husband said that he will bring it, if he found it at home.

## 2019-04-14 NOTE — Progress Notes (Signed)
Pt. Has been admitted at rehab. RN explained rehab routine and protocol. Safety plan was explained,as well as fall prevention plan.

## 2019-04-14 NOTE — Discharge Summary (Addendum)
Physician Discharge Summary  Patient ID: Tiffany Gill MRN: [id number] DOB/AGE: [id number] y.o.  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 The patient tolerated the procedure well and was taken to the recovery room and then to the floor in stable condition. The hospital course was routine. There were no complications. The wound remained clean dry and intact. Pt had appropriate back soreness. No complaints of leg pain or new N/T/W. The patient remained afebrile with stable vital signs, and tolerated a regular diet. The patient continued to increase activities, and pain was well controlled with oral pain medications.   Consults: None  Significant Diagnostic Studies:  Results for orders placed or performed during the hospital encounter of 03/24/19  SARS Coronavirus 2 [facility] order, Performed in [facility] hospital lab)  Result Value Ref Range   SARS Coronavirus 2 NEGATIVE NEGATIVE  Anaerobic culture   Specimen: Wound  Result Value Ref Range   Specimen Description WOUND    Special Requests LUMBAR SUPERFICIAL SAMPLE A    Culture      NO GROWTH NO ANAEROBES ISOLATED Performed at [facility], [place] [address], [place], [place] [address]    Report Status 04/13/2019 FINAL   Aerobic Culture (superficial specimen)   Specimen: Wound  Result Value Ref Range   Specimen Description WOUND    Special Requests LUMBAR SUPERFICIAL SAMPLE A    Gram Stain      RARE WBC PRESENT,BOTH PMN AND MONONUCLEAR NO ORGANISMS SEEN    Culture      NO GROWTH 2 DAYS Performed at [facility],  [address], [place], [place] [address]    Report Status 04/10/2019 FINAL   Anaerobic culture   Specimen: Wound  Result Value Ref Range   Specimen Description WOUND    Special Requests LUMBAR DEEP SAMPLE B    Gram Stain      RARE WBC PRESENT,BOTH PMN AND MONONUCLEAR NO ORGANISMS SEEN    Culture      NO GROWTH NO ANAEROBES ISOLATED Performed at [facility], [address] [place], [place] [address]    Report Status 04/13/2019 FINAL   Aerobic Culture (superficial specimen)   Specimen: Wound  Result Value Ref Range   Specimen Description WOUND    Special Requests LUMBAR DEEP SAMPLE B    Gram Stain      RARE WBC PRESENT,BOTH PMN AND MONONUCLEAR NO ORGANISMS SEEN    Culture      NO GROWTH 2 DAYS Performed at [facility], [address] [place], [place] [address]    Report Status 04/10/2019 FINAL   SARS Coronavirus 2 (CEPHEID - Performed in [place] hospital lab), [facility] Order   Specimen: Nasopharyngeal Swab  Result Value Ref Range   SARS Coronavirus 2 NEGATIVE NEGATIVE  CBC with Differential/Platelet  Result Value Ref Range   WBC 14.0 (H) 4.0 - 10.5 K/uL   RBC 3.62 (L) 3.87 - 5.11 MIL/uL   Hemoglobin 11.7 (L) 12.0 - 15.0 g/dL   HCT 34.5 (L) 36.0 - 46.0 %   MCV 95.3 80.0 - 100.0 fL   MCH 32.3 26.0 - 34.0  pg   MCHC 33.9 30.0 - 36.0 g/dL   RDW 12.3 11.5 - 15.5 %   Platelets 336 150 - 400 K/uL   nRBC 0.0 0.0 - 0.2 %   Neutrophils Relative % 94 %   Neutro Abs 13.2 (H) 1.7 - 7.7 K/uL   Lymphocytes Relative 3 %   Lymphs Abs 0.4 (L) 0.7 - 4.0 K/uL   Monocytes Relative 2 %   Monocytes Absolute 0.3 0.1 - 1.0 K/uL   Eosinophils Relative 0 %   Eosinophils Absolute 0.0 0.0 - 0.5 K/uL   Basophils Relative 0 %   Basophils Absolute 0.0 0.0 - 0.1 K/uL   Immature Granulocytes 1 %   Abs Immature Granulocytes 0.14 (H) 0.00 - 0.07 K/uL  Basic metabolic panel  Result Value Ref Range   Sodium 134 (L) 135 - 145 mmol/L   Potassium 4.3 3.5 - 5.1 mmol/L   Chloride 100  98 - 111 mmol/L   CO2 23 22 - 32 mmol/L   Glucose, Bld 145 (H) 70 - 99 mg/dL   BUN 38 (H) 8 - 23 mg/dL   Creatinine, Ser 1.18 (H) 0.44 - 1.00 mg/dL   Calcium 9.0 8.9 - 10.3 mg/dL   GFR calc non Af Amer 44 (L) >60 mL/min   GFR calc Af Amer 52 (L) >60 mL/min   Anion gap 11 5 - 15  Urinalysis, Routine w reflex microscopic  Result Value Ref Range   Color, Urine YELLOW YELLOW   APPearance CLEAR CLEAR   Specific Gravity, Urine 1.015 1.005 - 1.030   pH 7.0 5.0 - 8.0   Glucose, UA NEGATIVE NEGATIVE mg/dL   Hgb urine dipstick NEGATIVE NEGATIVE   Bilirubin Urine NEGATIVE NEGATIVE   Ketones, ur NEGATIVE NEGATIVE mg/dL   Protein, ur NEGATIVE NEGATIVE mg/dL   Nitrite NEGATIVE NEGATIVE   Leukocytes,Ua NEGATIVE NEGATIVE  Comprehensive metabolic panel  Result Value Ref Range   Sodium 135 135 - 145 mmol/L   Potassium 4.3 3.5 - 5.1 mmol/L   Chloride 101 98 - 111 mmol/L   CO2 24 22 - 32 mmol/L   Glucose, Bld 99 70 - 99 mg/dL   BUN 39 (H) 8 - 23 mg/dL   Creatinine, Ser 1.12 (H) 0.44 - 1.00 mg/dL   Calcium 9.2 8.9 - 10.3 mg/dL   Total Protein 5.8 (L) 6.5 - 8.1 g/dL   Albumin 3.1 (L) 3.5 - 5.0 g/dL   AST 27 15 - 41 U/L   ALT 45 (H) 0 - 44 U/L   Alkaline Phosphatase 82 38 - 126 U/L   Total Bilirubin 0.7 0.3 - 1.2 mg/dL   GFR calc non Af Amer 47 (L) >60 mL/min   GFR calc Af Amer 55 (L) >60 mL/min   Anion gap 10 5 - 15  Basic metabolic panel  Result Value Ref Range   Sodium 137 135 - 145 mmol/L   Potassium 4.2 3.5 - 5.1 mmol/L   Chloride 109 98 - 111 mmol/L   CO2 23 22 - 32 mmol/L   Glucose, Bld 126 (H) 70 - 99 mg/dL   BUN 29 (H) 8 - 23 mg/dL   Creatinine, Ser 0.99 0.44 - 1.00 mg/dL   Calcium 8.1 (L) 8.9 - 10.3 mg/dL   GFR calc non Af Amer 55 (L) >60 mL/min   GFR calc Af Amer >60 >60 mL/min   Anion gap 5 5 - 15  TSH  Result Value Ref Range   TSH 2.056 0.350 - 4.500 uIU/mL  Basic metabolic panel  Result Value Ref Range   Sodium 134 (L) 135 - 145 mmol/L   Potassium 4.1 3.5 - 5.1  mmol/L   Chloride 101 98 - 111 mmol/L   CO2 21 (L) 22 - 32 mmol/L   Glucose, Bld 109 (H) 70 - 99 mg/dL   BUN 16 8 - 23 mg/dL   Creatinine, Ser 1.13 (H) 0.44 - 1.00 mg/dL   Calcium 9.5 8.9 - 10.3 mg/dL   GFR calc non Af Amer 47 (L) >60 mL/min   GFR calc Af Amer 54 (L) >60 mL/min   Anion gap 12 5 - 15  CBC with Differential/Platelet  Result Value Ref Range   WBC 9.8 4.0 - 10.5 K/uL   RBC 3.91 3.87 - 5.11 MIL/uL   Hemoglobin 12.7 12.0 - 15.0 g/dL   HCT 37.7 36.0 - 46.0 %   MCV 96.4 80.0 - 100.0 fL   MCH 32.5 26.0 - 34.0 pg   MCHC 33.7 30.0 - 36.0 g/dL   RDW 12.9 11.5 - 15.5 %   Platelets 310 150 - 400 K/uL   nRBC 0.0 0.0 - 0.2 %   Neutrophils Relative % 80 %   Neutro Abs 7.9 (H) 1.7 - 7.7 K/uL   Lymphocytes Relative 10 %   Lymphs Abs 1.0 0.7 - 4.0 K/uL   Monocytes Relative 5 %   Monocytes Absolute 0.5 0.1 - 1.0 K/uL   Eosinophils Relative 4 %   Eosinophils Absolute 0.4 0.0 - 0.5 K/uL   Basophils Relative 0 %   Basophils Absolute 0.0 0.0 - 0.1 K/uL   Immature Granulocytes 1 %   Abs Immature Granulocytes 0.12 (H) 0.00 - 0.07 K/uL  Sedimentation rate  Result Value Ref Range   Sed Rate 50 (H) 0 - 22 mm/hr  Basic metabolic panel  Result Value Ref Range   Sodium 135 135 - 145 mmol/L   Potassium 3.9 3.5 - 5.1 mmol/L   Chloride 104 98 - 111 mmol/L   CO2 18 (L) 22 - 32 mmol/L   Glucose, Bld 135 (H) 70 - 99 mg/dL   BUN 20 8 - 23 mg/dL   Creatinine, Ser 1.29 (H) 0.44 - 1.00 mg/dL   Calcium 8.7 (L) 8.9 - 10.3 mg/dL   GFR calc non Af Amer 40 (L) >60 mL/min   GFR calc Af Amer 46 (L) >60 mL/min   Anion gap 13 5 - 15  CBC with Differential/Platelet  Result Value Ref Range   WBC 12.4 (H) 4.0 - 10.5 K/uL   RBC 2.75 (L) 3.87 - 5.11 MIL/uL   Hemoglobin 8.9 (L) 12.0 - 15.0 g/dL   HCT 28.0 (L) 36.0 - 46.0 %   MCV 101.8 (H) 80.0 - 100.0 fL   MCH 32.4 26.0 - 34.0 pg   MCHC 31.8 30.0 - 36.0 g/dL   RDW 14.4 11.5 - 15.5 %   Platelets 217 150 - 400 K/uL   nRBC 0.0 0.0 - 0.2 %    Neutrophils Relative % 81 %   Neutro Abs 10.0 (H) 1.7 - 7.7 K/uL   Lymphocytes Relative 10 %   Lymphs Abs 1.3 0.7 - 4.0 K/uL   Monocytes Relative 6 %   Monocytes Absolute 0.8 0.1 - 1.0 K/uL   Eosinophils Relative 2 %   Eosinophils Absolute 0.2 0.0 - 0.5 K/uL   Basophils Relative 0 %   Basophils Absolute 0.0 0.0 - 0.1 K/uL   Immature Granulocytes 1 %   Abs Immature Granulocytes  0.16 (H) 0.00 - 0.07 K/uL  Type and screen [place]  Result Value Ref Range   ABO/RH(D) O POS    Antibody Screen NEG    Sample Expiration      04/11/2019,2359 Performed at [facility], [place] [address], [place], [place] [address]     Dg Lumbar Spine 2-3 Views  Result Date: 04/08/2019 CLINICAL DATA:  Exploration and revision of spinal fusion. EXAM: DG C-ARM 61-120 MIN; LUMBAR SPINE - 2-3 VIEW COMPARISON:  Lumbar spine CT dated 04/07/2019. FINDINGS: One lateral and 2 PA C-arm views of the lumbar spine were obtained. The previously demonstrated anterolisthesis at the L3-4 level has been reduced. And interbody cage remains in place at the L4-5 level pedicle screws bilaterally at the L4 and L5 levels. The previously demonstrated fixation rods are no longer demonstrated. IMPRESSION: Operative changes, as described above. Electronically Signed   By: Claudie Revering M.D.   On: 04/08/2019 17:06   Dg Lumbar Spine 2-3 Views  Result Date: 03/27/2019 CLINICAL DATA:  Status post back surgery, initial encounter EXAM: LUMBAR SPINE - 3 VIEW COMPARISON:  03/24/2019 FINDINGS: Postsurgical changes are noted at L4-5. Persistent anterolisthesis of L4 on L5 is noted similar to that seen on the prior exam. The right spacer is inferiorly located within the superior aspect of L5 also stable from recent exam. Compression deformity at T12 is again noted and stable. Aortic calcifications are seen. No soft tissue abnormality is noted. IMPRESSION: Stable postoperative changes at L4-5. Chronic T12 compression deformity.  Electronically Signed   By: Inez Catalina M.D.   On: 03/27/2019 09:03   Dg Lumbar Spine 2-3 Views  Result Date: 03/17/2019 CLINICAL DATA:  Back pain.  BILATERAL leg pain. EXAM: DG C-ARM 61-120 MIN; LUMBAR SPINE - 2-3 VIEW COMPARISON:  10/28/2018. FINDINGS: Intraoperative radiographs demonstrate L4-5 PLIF. Satisfactory position and alignment of the BILATERAL L4 and BILATERAL L5 pedicle screws as well as the interbody cages. IMPRESSION: As above. Electronically Signed   By: Staci Righter M.D.   On: 03/17/2019 09:41   Dg Sacrum/coccyx  Result Date: 04/02/2019 CLINICAL DATA:  Fall with tailbone pain. EXAM: SACRUM AND COCCYX - 2+ VIEW COMPARISON:  Left hip radiograph 03/28/2019 FINDINGS: Cortical margins of the sacrum and coccyx are intact. No evidence of fracture. Sacroiliac joints are congruent. Postsurgical change at L4-L5, evaluated on lumbar spine MRI earlier today. IMPRESSION: No evidence of sacral or coccygeal fracture. Electronically Signed   By: Keith Rake M.D.   On: 04/02/2019 21:55   Ct Head Wo Contrast  Result Date: 04/02/2019 CLINICAL DATA:  78 year old female with lumbar spinal subdural hematoma, evaluate for new intracranial subdural hematoma. EXAM: CT HEAD WITHOUT CONTRAST TECHNIQUE: Contiguous axial images were obtained from the base of the skull through the vertex without intravenous contrast. COMPARISON:  Prior MRI from earlier the same day as well as previous CT from 03/29/2019. FINDINGS: Brain: Generalized age-related cerebral atrophy with chronic microvascular ischemic disease, stable. No acute intracranial hemorrhage. No new subdural hematoma or other extra-axial collection. No acute large vessel territory infarct. No mass lesion, midline shift or mass effect. Diffuse ventricular prominence related to global parenchymal volume loss without hydrocephalus. Vascular: No hyperdense vessel. Scattered vascular calcifications noted within the carotid siphons. Skull: Right occipital scalp  laceration with skin staples in place. Calvarium intact. Sinuses/Orbits: Globes and orbital soft tissues demonstrate no acute finding. Paranasal sinuses are clear. Small chronic bilateral mastoid effusions noted, stable. Other: None. IMPRESSION: 1. No new subdural  hematoma or other acute intracranial abnormality. 2. Right occipital scalp laceration with skin staples in place. 3. Age-related cerebral atrophy with advanced chronic microvascular ischemic disease, stable. Electronically Signed   By: Jeannine Boga M.D.   On: 04/02/2019 21:56   Ct Head Wo Contrast  Result Date: 03/29/2019 CLINICAL DATA:  78 year old female found down by nurse. Posterior head laceration. EXAM: CT HEAD WITHOUT CONTRAST CT CERVICAL SPINE WITHOUT CONTRAST TECHNIQUE: Multidetector CT imaging of the head and cervical spine was performed following the standard protocol without intravenous contrast. Multiplanar CT image reconstructions of the cervical spine were also generated. COMPARISON:  Brain MRI 10/28/2018 FINDINGS: CT HEAD FINDINGS Brain: Stable cerebral volume. Confluent bilateral cerebral white matter hypodensity appears stable to the February MRI. Stable ventricle size and configuration. No acute intracranial hemorrhage identified. No midline shift, mass effect, or evidence of intracranial mass lesion. No cortically based acute infarct identified. Vascular: Calcified atherosclerosis at the skull base. No suspicious intracranial vascular hyperdensity. Skull: No acute osseous abnormality identified. Sinuses/Orbits: Paranasal sinuses, tympanic cavities and mastoids are clear. There is a tiny left posterior ethmoid osteoma (normal variant). Other: There is a mild right posterior convexity scalp contusion with trace soft tissue gas on series 4, image 47. Underlying calvarium intact. Visualized orbit soft tissues are within normal limits. CT CERVICAL SPINE FINDINGS Alignment: Preserved lower cervical lordosis, straightening of upper  cervical lordosis. Cervicothoracic junction alignment is within normal limits. Bilateral posterior element alignment is within normal limits. Skull base and vertebrae: Visualized skull base is intact. No atlanto-occipital dissociation. Benign appearing sclerosis of the right C3 lamina, probably benign bone island. - No acute osseous abnormality identified. Soft tissues and spinal canal: No prevertebral fluid or swelling. No visible canal hematoma. Calcified carotid atherosclerosis. Right IJ approach central line. Disc levels: Severe chronic disc and endplate degeneration at C5-C6 where there may be developing interbody ankylosis. Mild spinal stenosis suspected at that level. Upper chest: T4-T5 ankylosis and osteopenia in the visible upper thoracic spine. Visible upper thoracic levels appear intact. Calcified aortic atherosclerosis. 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 IMPRESSION: 1. Mild right posterior convexity scalp soft tissue injury without underlying fracture. 2. No acute intracranial abnormality. Advanced cerebral white matter disease appears stable since the February MRI. 3. No acute traumatic injury identified in the cervical spine. 4. Partially visible ectatic arch and Aortic Atherosclerosis (ICD10-I70.0). Electronically Signed   By: Genevie Ann M.D.   On: 03/29/2019 16:50   Ct Cervical Spine Wo Contrast  Result Date: 03/29/2019 CLINICAL DATA:  78 year old female found down by nurse. Posterior head laceration. EXAM: CT HEAD WITHOUT CONTRAST CT CERVICAL SPINE WITHOUT CONTRAST TECHNIQUE: Multidetector CT imaging of the head and cervical spine was performed following the standard protocol without intravenous contrast. Multiplanar CT image reconstructions of the cervical spine were also generated. COMPARISON:  Brain MRI 10/28/2018 FINDINGS: CT HEAD FINDINGS Brain: Stable cerebral volume. Confluent bilateral cerebral white  matter hypodensity appears stable to the February MRI. Stable ventricle size and configuration. No acute intracranial hemorrhage identified. No midline shift, mass effect, or evidence of intracranial mass lesion. No cortically based acute infarct identified. Vascular: Calcified atherosclerosis at the skull base. No suspicious intracranial vascular hyperdensity. Skull: No acute osseous abnormality identified. Sinuses/Orbits: Paranasal sinuses, tympanic cavities and mastoids are clear. There is a tiny left posterior ethmoid osteoma (normal variant). Other: There is a mild right posterior convexity scalp contusion with trace soft tissue gas on series  4, image 47. Underlying calvarium intact. Visualized orbit soft tissues are within normal limits. CT CERVICAL SPINE FINDINGS Alignment: Preserved lower cervical lordosis, straightening of upper cervical lordosis. Cervicothoracic junction alignment is within normal limits. Bilateral posterior element alignment is within normal limits. Skull base and vertebrae: Visualized skull base is intact. No atlanto-occipital dissociation. Benign appearing sclerosis of the right C3 lamina, probably benign bone island. - No acute osseous abnormality identified. Soft tissues and spinal canal: No prevertebral fluid or swelling. No visible canal hematoma. Calcified carotid atherosclerosis. Right IJ approach central line. Disc levels: Severe chronic disc and endplate degeneration at C5-C6 where there may be developing interbody ankylosis. Mild spinal stenosis suspected at that level. Upper chest: T4-T5 ankylosis and osteopenia in the visible upper thoracic spine. Visible upper thoracic levels appear intact. Calcified aortic atherosclerosis. 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 IMPRESSION: 1. Mild right posterior convexity scalp soft tissue injury without underlying fracture. 2. No acute intracranial  abnormality. Advanced cerebral white matter disease appears stable since the February MRI. 3. No acute traumatic injury identified in the cervical spine. 4. Partially visible ectatic arch and Aortic Atherosclerosis (ICD10-I70.0). Electronically Signed   By: Genevie Ann M.D.   On: 03/29/2019 16:50   Ct Lumbar Spine Wo Contrast  Result Date: 04/07/2019 CLINICAL DATA:  Lumbar spine fracture, traumatic EXAM: CT LUMBAR SPINE WITHOUT CONTRAST TECHNIQUE: Multidetector CT imaging of the lumbar spine was performed without intravenous contrast administration. Multiplanar CT image reconstructions were also generated. COMPARISON:  Lumbar MRI April 02, 2019 and lumbar CT 03/24/2019 FINDINGS: Segmentation: 5 lumbar type vertebrae Alignment: Grade 1 anterolisthesis at L4-5 which is increased from prior, now measuring 9 mm. Vertebrae: L4-5 recent PLIF. There is been further cage subsidence into the fractured superior endplate of L5. Fracture is seen in the bilateral posterior elements of L5 and in the left pedicle of L4, with similar positioning of the screws. The cage has become more uncovered due to listhesis and encroaches on the left foramen. There is advanced thecal sac narrowing at L4-5 due to the listhesis. Remote T11 and T12 compression fractures. Paraspinal and other soft tissues: Subcutaneous incisional fluid is likely diminishing. Disc levels: Postoperative level as above. The other levels are stable. Other: Formed stool throughout the colon on scout view IMPRESSION: 1. Recent L4-5 PLIF with progressive cage subsidence into the fractured L5 superior endplate. Newly seen fractures of the bilateral L5 and left L4 pedicles with increasing L4-5 anterolisthesis that now measures 9 mm. The uncovered cage causes moderate left foraminal impingement. 2. Constipation Electronically Signed   By: Monte Fantasia M.D.   On: 04/07/2019 11:21   Ct Lumbar Spine Wo Contrast  Result Date: 03/24/2019 CLINICAL DATA:  Back pain.  Lumbar  fusion 1 week ago EXAM: CT LUMBAR SPINE WITHOUT CONTRAST TECHNIQUE: Multidetector CT imaging of the lumbar spine was performed without intravenous contrast administration. Multiplanar CT image reconstructions were also generated. COMPARISON:  Lumbar radiographs March 24, 2019, lumbar MRI 10/28/2018 FINDINGS: Segmentation: Normal Alignment: 4 mm anterolisthesis L4-5 Vertebrae: Chronic compression fracture T12. Mild chronic compression fracture T11. Paraspinal and other soft tissues: Atherosclerotic aorta Negative for paraspinous mass or edema. Disc levels: T12-L1: Negative L1-2: Negative L2-3: Mild disc bulging without spinal stenosis. Mild facet degeneration. L3-4: Mild disc bulging and mild facet degeneration without significant stenosis L4-5: 4 mm anterolisthesis. Bilateral pedicle screw fusion in good position. Bilateral interbody spacers. The right spacer is depressed into the  superior endplate of L5. No other postoperative imaging is available to determine if this is new. L5-S1: Mild disc bulging without stenosis. IMPRESSION: PLIF at L4-5. Subsidence of the right spacer into the superior endplate of L5. Remaining hardware in satisfactory position. No fluid collection. Electronically Signed   By: Franchot Gallo M.D.   On: 03/24/2019 19:54   Mr Lumbar Spine Wo Contrast  Result Date: 04/02/2019 CLINICAL DATA:  Severe back pain.  Recent fusion surgery. EXAM: MRI LUMBAR SPINE WITHOUT CONTRAST TECHNIQUE: Multiplanar, multisequence MR imaging of the lumbar spine was performed. No intravenous contrast was administered. COMPARISON:  Lumbar spine CT 03/24/2019.  Lumbar MRI 10/28/2018 FINDINGS: Segmentation:  5 lumbar type vertebrae Alignment:  Fused grade 1 anterolisthesis at L4-5 Vertebrae: L4-5 PLIF with mild edematous signal in the adjacent endplates, likely accentuated by susceptibility artifact. There is cage subsidence into the L5 superior endplate which is known from prior CT. Grossly located pedicle screws.  There is a dorsal intrathecal fluid collection with subdural appearance extending from L2 to L4, distorting the cauda equina without compression. Severe thecal sac effacement at L4 and L5 due to a combination of dorsal epidural fluid (see marks on sagittal STIR imaging and sagittal T1 weighted imaging), epidural fat effacement, posterior element hypertrophy and slip. Cage subsidence continues to narrow the foramina, with possible residual disc material at the left foramen based on sagittal T2 weighted imaging. Remote T12 compression fracture. Conus medullaris and cauda equina: Conus extends to the L1-2 level. No conus edema Paraspinal and other soft tissues: Incisional fluid collection mid L3 to with thin internal septations spanning mid L5. No fluid collection deep to the deep fascia. Disc levels: Postoperative level as described above. The other levels are stable. IMPRESSION: 1. Recent L4-5 PLIF with known prominent cage subsidence into the L5 superior endplate. 2. Severe thecal sac effacement at L4-5 and L5 due to listhesis, hypertrophic posterior elements, epidural fat, and a small dorsal epidural collection at the level of L5. 3. Subdural collection from L2-L4 with displacement but no compression of the nerve roots. 4. Subcutaneous incisional fluid collection without adjacent inflammation. Electronically Signed   By: Monte Fantasia M.D.   On: 04/02/2019 12:48   Ct Hip Left Wo Contrast  Result Date: 03/28/2019 CLINICAL DATA:  Hip pain, acute, fx suspected, initial exam. Recent lumbar spine surgery. EXAM: CT OF THE LEFT HIP WITHOUT CONTRAST TECHNIQUE: Multidetector CT imaging of the left hip was performed according to the standard protocol. Multiplanar CT image reconstructions were also generated. COMPARISON:  None. No radiographs available. FINDINGS: Bones/Joint/Cartilage No acute fracture. Femoral head well seated in the acetabulum. Age related acetabular spurring without significant joint space narrowing.  Nutrient channel in the posterior acetabulum, incidental. No large hip joint effusion. No bony destructive change. Pubic rami are intact. Ligaments Suboptimally assessed by CT. Muscles and Tendons No acute finding. Evidence of intramuscular fluid collection. Soft tissues 13 mm low-density in the right inguinal canal is nonspecific, partially included, likely small lymph node. No large soft tissue hematoma or focal fluid collection. IMPRESSION: 1. No acute osseous abnormality of the left hip. 2. Age related acetabular spurring. Electronically Signed   By: Keith Rake M.D.   On: 03/28/2019 19:45   Dg C-arm 1-60 Min  Result Date: 04/08/2019 CLINICAL DATA:  Exploration and revision of spinal fusion. EXAM: DG C-ARM 61-120 MIN; LUMBAR SPINE - 2-3 VIEW COMPARISON:  Lumbar spine CT dated 04/07/2019. FINDINGS: One lateral and 2 PA C-arm views of the lumbar spine were  obtained. The previously demonstrated anterolisthesis at the L3-4 level has been reduced. And interbody cage remains in place at the L4-5 level pedicle screws bilaterally at the L4 and L5 levels. The previously demonstrated fixation rods are no longer demonstrated. IMPRESSION: Operative changes, as described above. Electronically Signed   By: Claudie Revering M.D.   On: 04/08/2019 17:06   Dg C-arm 1-60 Min  Result Date: 03/17/2019 CLINICAL DATA:  Back pain.  BILATERAL leg pain. EXAM: DG C-ARM 61-120 MIN; LUMBAR SPINE - 2-3 VIEW COMPARISON:  10/28/2018. FINDINGS: Intraoperative radiographs demonstrate L4-5 PLIF. Satisfactory position and alignment of the BILATERAL L4 and BILATERAL L5 pedicle screws as well as the interbody cages. IMPRESSION: As above. Electronically Signed   By: Staci Righter M.D.   On: 03/17/2019 09:41    Antibiotics:  Anti-infectives (From admission, onward)   Start     Dose/Rate Route Frequency Ordered Stop   04/08/19 2200  ceFAZolin (ANCEF) IVPB 2g/100 mL premix     2 g 200 mL/hr over 30 Minutes Intravenous Every 8 hours  04/08/19 2050 04/10/19 1804   04/08/19 1300  bacitracin 50,000 Units in sodium chloride 0.9 % 500 mL irrigation  Status:  Discontinued       As needed 04/08/19 1448 04/08/19 1753      Discharge Exam: Blood pressure 122/65, pulse 93, temperature 98 F (36.7 C), temperature source Oral, resp. rate 20, height 5' (1.524 m), weight 56.1 kg, SpO2 94 %. Neurologic: Grossly normal Ambulating and voiding well, incision cdi  Discharge Medications:   Allergies as of 04/14/2019      Reactions   Metronidazole Anaphylaxis, Other (See Comments)   Seizure      Medication List    TAKE these medications   amLODipine 2.5 MG tablet Commonly known as: NORVASC Take 2.5 mg by mouth daily.   ARIPiprazole 2 MG tablet Commonly known as: ABILIFY Take 2 mg by mouth daily.   buPROPion 300 MG 24 hr tablet Commonly known as: WELLBUTRIN XL Take 300 mg by mouth daily.   clonazePAM 0.5 MG tablet Commonly known as: KLONOPIN Take 0.5 mg by mouth 3 (three) times daily as needed for anxiety.   cyclobenzaprine 10 MG tablet Commonly known as: FLEXERIL Take 1 tablet (10 mg total) by mouth 3 (three) times daily as needed for muscle spasms.   Deplin 15 15-90.314 MG Caps Take 15 mg by mouth daily.   DULoxetine 60 MG capsule Commonly known as: CYMBALTA Take 60 mg by mouth daily.   HYDROcodone-acetaminophen 10-325 MG tablet Commonly known as: NORCO Take 1 tablet by mouth every 6 (six) hours as needed for moderate pain. What changed: Another medication with the same name was added. Make sure you understand how and when to take each.   HYDROcodone-acetaminophen 5-325 MG tablet Commonly known as: NORCO/VICODIN Take 1-2 tablets by mouth every 6 (six) hours as needed for moderate pain. What changed: You were already taking a medication with the same name, and this prescription was added. Make sure you understand how and when to take each.   ibandronate 150 MG tablet Commonly known as: BONIVA Take 150 mg by  mouth every 30 (thirty) days.   losartan 50 MG tablet Commonly known as: COZAAR Take 50 mg by mouth daily.   pravastatin 40 MG tablet Commonly known as: PRAVACHOL Take 40 mg by mouth every evening.   predniSONE 10 MG tablet Commonly known as: DELTASONE Take 10 mg by mouth daily with breakfast.   Synthroid 100 MCG tablet  Generic drug: levothyroxine Take 100 mcg by mouth daily before breakfast.   Vitamin D3 25 MCG (1000 UT) Caps Take 1,000 Units by mouth daily.   zolpidem 5 MG tablet Commonly known as: AMBIEN Take 5 mg by mouth at bedtime as needed for sleep.            Durable Medical Equipment  (From admission, onward)         Start     Ordered   03/26/19 1208  For home use only DME 3 n 1  Once     03/26/19 1208          Disposition: home   Final Dx: reexploration L4-5 PLIF and removal of interbody cages and L4-5 screws, posterior lateral fixation and arthrodesis L3-S1.  Discharge Instructions    Call MD for:  difficulty breathing, headache or visual disturbances   Complete by: As directed    Call MD for:  hives   Complete by: As directed    Call MD for:  persistant dizziness or light-headedness   Complete by: As directed    Call MD for:  persistant nausea and vomiting   Complete by: As directed    Call MD for:  redness, tenderness, or signs of infection (pain, swelling, redness, odor or green/yellow discharge around incision site)   Complete by: As directed    Call MD for:  severe uncontrolled pain   Complete by: As directed    Call MD for:  temperature >100.4   Complete by: As directed    Diet - low sodium heart healthy   Complete by: As directed    Increase activity slowly   Complete by: As directed          Signed: [name] Meyran 04/14/2019, 1:58 PM

## 2019-04-14 NOTE — Progress Notes (Signed)
Inpatient Rehabilitation Admissions Coordinator  Inpatient rehab consult received to reevaluate pt for a possible inpt rehab admission. I originally saw patient and spouse on 7/6. At that time, pain was intolerable and patient unable to participate in the intensity of an inpt rehab admission. I met today with patient and her spouse at bedside. I dicussed goals and expectations of an inpt rehab admission. They prefer an inpt rehab rather than SNF. Patient was completely independent prior to first surgery in June. She currently is oriented and pain management ongoing. I have asked P.T., Jenny Reichmann to see patient today so that I can assess her tolerance for more intense therapies. PT and OT to see patient around 1 pm today. RN, Elmyra Ricks, to premedicate patient for pain management. I will follow up this afternoon .  Danne Baxter, RN, MSN Rehab Admissions Coordinator [phone] 04/14/2019 12:20 PM

## 2019-04-14 NOTE — H&P (Signed)
Physical Medicine and Rehabilitation Admission H&P     CC: Functional deficits due to Lumbar spondylolisthesis with radiculopathy.      HPI:  Tiffany Gill is a 78 year old female with history of HTN, MDD, memory loss, LBP radiating to LLE>RLE due to spondylolisthesis with facet arthropathy and severe spinal stenosis affecting L4 and L5 nerve roots s/p 20 for L4-L5 decompressive laminectomy with fusion 03/17/19 who started developing progressive LBP and hip pain with follow up X rays in office revealing subsistence of spacer.  She was admitted on 03/24/19 for work up and pain management. CT spine done revealing subsidence of right spacer into superior endplate of L5 but overall stable hardware. 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 She did sustained a fall striking her head on 07/04 with subsequent occipital laceration.  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 CT left hip was negative for fracture.    She continued to have issues with delirium and buttock pain therefore hospitalist were consulted for input she was treated with fluid bolus with improvement in renal status and mentation. They recommended MRI lumbar spine which was done 04/02/19 showing severe thecal sac effacement L4/L5 due to listhesis with epidural collection at L5 and subdural collection from L2-L4 with displacement. Head CT repeated and was negative for bleed. Medications adjusted to help with pain management but she continued to have pain and anxiety limiting overall mobility. After discussion with husband, she was taken by to OR for re-exploration of L4/L5 with removal of residual spinous process and redo foraminotomies with screw fixation L3-S1 on 04/08/19  by Dr. Saintclair Halsted.  Post op, she has had great improvement in pain and mild left  foot drop reported to be stable. MD recommended CIR due to functional decline.       Review of Systems  Constitutional: Negative for chills, fever and malaise/fatigue.  HENT: Positive for hearing loss. Negative for tinnitus.   Eyes: Negative for blurred vision.  Respiratory: Negative for cough and hemoptysis.   Cardiovascular: Negative for chest pain, palpitations and leg swelling.  Gastrointestinal: Negative for constipation.  Genitourinary: Negative for dysuria and urgency.  Musculoskeletal: Positive for falls (last 6 months ago down some stairs) and myalgias.  Skin: Negative for itching and rash.  Neurological: Positive for weakness. Negative for dizziness, sensory change and headaches.  Psychiatric/Behavioral: Positive for memory loss. Negative for depression. The patient is nervous/anxious.             Past Medical History:  Diagnosis Date  . Anxiety    . Gait disorder    . High cholesterol    . Hypertension    . Hypothyroidism    . Major depression in partial remission ([place])    . Memory loss    . Osteoarthritis            Past Surgical History:  Procedure Laterality Date  . DECOMPRESSIVE LUMBAR LAMINECTOMY LEVEL 4   02/2019  . PARATHYROIDECTOMY              Family History  Problem Relation Age of Onset  . Stroke Mother    . Heart disease Father    . Heart disease Brother       Social History: Married. 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 She reports that she  has never smoked. She has never used smokeless tobacco. She reports current alcohol --liquor about 5 X week. She reports that she does not use drugs.           Allergies  Allergen Reactions  . Metronidazole Anaphylaxis and Other (See Comments)      Seizure             Medications Prior to Admission  Medication Sig Dispense Refill  . amLODipine (NORVASC) 2.5 MG tablet Take 2.5 mg by mouth daily.      . ARIPiprazole (ABILIFY) 2 MG tablet Take 2 mg by mouth daily.      Marland Kitchen buPROPion  (WELLBUTRIN XL) 300 MG 24 hr tablet Take 300 mg by mouth daily.      . Cholecalciferol (VITAMIN D3) 25 MCG (1000 UT) CAPS Take 1,000 Units by mouth daily.      . clonazePAM (KLONOPIN) 0.5 MG tablet Take 0.5 mg by mouth 3 (three) times daily as needed for anxiety.       . DULoxetine (CYMBALTA) 60 MG capsule Take 60 mg by mouth daily.      Marland Kitchen HYDROcodone-acetaminophen (NORCO) 10-325 MG tablet Take 1 tablet by mouth every 6 (six) hours as needed for moderate pain.      Marland Kitchen ibandronate (BONIVA) 150 MG tablet Take 150 mg by mouth every 30 (thirty) days.      Marland Kitchen L-Methylfolate-Algae (DEPLIN 15) 15-90.314 MG CAPS Take 15 mg by mouth daily.      Marland Kitchen losartan (COZAAR) 50 MG tablet Take 50 mg by mouth daily.      . pravastatin (PRAVACHOL) 40 MG tablet Take 40 mg by mouth every evening.      . predniSONE (DELTASONE) 10 MG tablet Take 10 mg by mouth daily with breakfast.      . SYNTHROID 100 MCG tablet Take 100 mcg by mouth daily before breakfast.      . zolpidem (AMBIEN) 5 MG tablet Take 5 mg by mouth at bedtime as needed for sleep.          Drug Regimen Review  Drug regimen was reviewed and remains appropriate with no significant issues identified   Home: Home Living Family/patient expects to be discharged to:: Private residence Living Arrangements: Spouse/significant other Available Help at Discharge: Family, Available 24 hours/day Type of Home: House Home Access: Stairs to enter [employer] of Steps: 3 Entrance Stairs-Rails: Can reach both Home Layout: Two level, Full bath on main level, Able to live on main level with bedroom/bathroom Alternate Level Stairs-Number of Steps: full flight Alternate Level Stairs-Rails: Can reach both Bathroom Shower/Tub: Gaffer, Door [employer] Toilet: Standard Home Equipment: Environmental consultant - 2 wheels   Functional History: Prior Function Level of Independence: Needs assistance Gait / Transfers Assistance Needed: ambulating with RW ADL's / Homemaking  Assistance Needed: requiring assist with donning/doffing brace and LB dressing as pt iwth LB weakness. Comments: Had been using RW for mobility.    Functional Status:  Mobility: Bed Mobility Overal bed mobility: Needs Assistance Bed Mobility: Rolling, Sidelying to Sit Rolling: Min assist Sidelying to sit: Max assist, +2 for physical assistance Supine to sit: Mod assist, +2 for physical assistance Sit to sidelying: +2 for physical assistance, +2 for safety/equipment, Mod assist General bed mobility comments: Rolling to left side with cues for bending contralateral knee, reaching with arm for bed rails, minA to facilitate. MaxA + 2 for sidelying > sit with assist for legs off edge of bed and trunk elevation Transfers Overall transfer  level: Needs assistance Equipment used: Rolling walker (2 wheeled) Transfers: Sit to/from Stand, [employer] Transfers Sit to Stand: Mod assist, +2 physical assistance Stand pivot transfers: Max assist, +2 physical assistance General transfer comment: ModA + 2 to stand from edge of bed, maxA + 2 to pivot towards left side from bed to recliner. Very narrow BOS, posterior lean, cues for motor initiation/coordination  Ambulation/Gait Ambulation/Gait assistance: Min assist, Mod assist Gait Distance (Feet): 45 Feet Assistive device: Rolling walker (2 wheeled) Gait Pattern/deviations: Step-through pattern, Decreased step length - right, Decreased step length - left, Narrow base of support, Decreased dorsiflexion - left, Decreased weight shift to left General Gait Details:  alot of time spent encouraging pt to attempt gait training; pt declining and became extremely anxious and then transport arrived to take pt for CT  Gait velocity: Decreased      ADL: ADL Overall ADL's : Needs assistance/impaired Eating/Feeding: Minimal assistance Eating/Feeding Details (indicate cue type and reason): pt continues to require tactile cues to minA for hand eye coordination for  drinking task.  Grooming: Set up, Sitting Grooming Details (indicate cue type and reason): brushing hair; pt refusing to brush teeth at this time Upper Body Bathing: Moderate assistance, Sitting Lower Body Bathing: Maximal assistance, Sitting/lateral leans, Sit to/from stand Lower Body Bathing Details (indicate cue type and reason): unable to process LB AE even as a review from earlier this week, Upper Body Dressing : Moderate assistance, Sitting Upper Body Dressing Details (indicate cue type and reason): Pt doffing brace with  modA and wanting to lean back. Lower Body Dressing: Total assistance, Bed level Lower Body Dressing Details (indicate cue type and reason): Pt required max A and cues to iniate donning sock, but unable to follow through with activity, and unable to sustain attention   Toilet Transfer: Moderate assistance Toileting- Clothing Manipulation and Hygiene: Total assistance Functional mobility during ADLs: Maximal assistance, +2 for physical assistance, +2 for safety/equipment General ADL Comments: pt only able to tolerate bed mobility, sitting EOB for short period of time; spouse present during session, both pt and spouse report MD states pt should "take it slow"    Cognition: Cognition Overall Cognitive Status: Impaired/Different from baseline Orientation Level: Oriented X4 Cognition Arousal/Alertness: Awake/alert Behavior During Therapy: WFL for tasks assessed/performed Overall Cognitive Status: Impaired/Different from baseline Area of Impairment: Awareness, Memory, Problem solving, Safety/judgement Orientation Level: Disoriented to, Place, Time, Situation Current Attention Level: Focused Memory: Decreased recall of precautions Following Commands: Follows one step commands inconsistently, Follows one step commands with increased time Safety/Judgement: Decreased awareness of safety, Decreased awareness of deficits Awareness: Intellectual Problem Solving: Decreased  initiation, Difficulty sequencing, Requires verbal cues, Requires tactile cues, Slow processing General Comments: Excited to get to the chair today, needs max cues for sequencing/problem solving     Blood pressure (!) 114/48, pulse 90, temperature 98.3 F (36.8 C), temperature source Oral, resp. rate 18, height 5' (1.524 m), weight 56.1 kg, SpO2 96 %. Physical Exam  Nursing note and vitals reviewed. Constitutional: She appears well-developed and well-nourished.  Anxious appearing female. NAD  HENT:  Head: Normocephalic.  Eyes: Pupils are equal, round, and reactive to light. EOM are normal.  Neck: Normal range of motion. No thyromegaly present.  Cardiovascular: Normal rate and regular rhythm. Exam reveals no friction rub.  No murmur heard. Respiratory: No respiratory distress. She has no wheezes.  GI: Soft. Bowel sounds are normal. She exhibits no distension. There is no abdominal tenderness.  Musculoskeletal:     Comments:  Well healed long scar on left tibia. Multiple ecchymotic areas BUE.   Neurological: She is alert.  Alert and oriented to person, month, year, reason she's in the hospital. Speech clear. Delayed processing at times, somewhat tangential. UE 4-/5 prox to distal. LE 2/5 HF, 3-/5 KE and 4/5 ADF/PF. Pain inhibiting at times. no focal sensory findings. DTR's 1+.   Skin:  Back incision with honeycomb dressing in place.   Psychiatric:  Flat but overall pleasant and cooperative.       Lab Results Last 48 Hours  No results found for this or any previous visit (from the past 48 hour(s)).   Imaging Results (Last 48 hours)  No results found.           Medical Problem List and Plan: 1.  Functional deficits and lower extremity weakness  secondary to lumbar stenosis and poly-radiculopathy s/p decompression and fusion with subsequent re-do, also debilitated             -admit to inpatient rehab 2.  Antithrombotics: -DVT/anticoagulation:  Pharmaceutical: Heparin              -antiplatelet therapy: N/A 3. Pain Management: Will continue oxycodone and flexeril prn--             -schedule oxycodone prior to AM therapies to help with tolerance as pain has been a major inhibiting factor. Pt/husband in agreement             -d/c hydrocodone and robaxin to clear medicaiton list.              -Will continue lidocaine patch to left hip. 4. Mood: Team to provide ego support. LCSW to follow for evaluation and support when appropriate.  2             -antipsychotic agents: Continue Abilify.  5. Neuropsych: This patient is not fully capable of making decisions on her own behalf. 6. Skin/Wound Care: Routine pressure relief measures.  7. Fluids/Electrolytes/Nutrition: Monitor I/O. Check lytes in am.  8. HTN: Monitor BP tid--continue Cozaar and Norvasc.  9. Acute on chronic renal failure?: SCr was around 1.0 prior to admission. Recheck labs in am--avoid hypotension. Monitor SCr which is trending upwards--question hypoperfusion.  10. Delirium: Keep lights on during the day --monitor sleep wake cycle and set schedule.              -careful with opiates 11. MDD with anxiety: Was undergoing E[facility] at [facility] every 3 months  (prior to start of pandemic) Continue Abilify, Cymbalta and Wellbutrin with klonopin prn for anxiety.    12. ABLA: Monitor for signs of bleeding. Will recheck CBC in am. 13. Leucocytosis: Monitor for fevers and other signs of infection. Likely due ot low dose prednisone.      Post Admission Physician Evaluation: 1. Functional deficits secondary  to lumbar radiculopathy, s/p 2 surgeries, debility. 2. Patient is admitted to receive collaborative, interdisciplinary care between the physiatrist, rehab nursing staff, and therapy team. 3. Patient's level of medical complexity and substantial therapy needs in context of that medical necessity cannot be provided at a lesser intensity of care such as a SNF. 4. Patient has experienced substantial functional loss from his/her  baseline which was documented above under the "Functional History" and "Functional Status" headings.  Judging by the patient's diagnosis, physical exam, and functional history, the patient has potential for functional progress which will result in measurable gains while on inpatient rehab.  These gains will be of substantial and practical use  upon discharge  in facilitating mobility and self-care at the household level. 5. Physiatrist will provide 24 hour management of medical needs as well as oversight of the therapy plan/treatment and provide guidance as appropriate regarding the interaction of the two. 6. The Preadmission Screening has been reviewed and patient status is unchanged unless otherwise stated above. 7. 24 hour rehab nursing will assist with bladder management, bowel management, safety, skin/wound care, disease management, medication administration, pain management and patient education  and help integrate therapy concepts, techniques,education, etc. 8. PT will assess and treat for/with: Lower extremity strength, range of motion, stamina, balance, functional mobility, safety, adaptive techniques and equipment .   Goals are: supervision to min assist. 9. OT will assess and treat for/with: ADL's, functional mobility, safety, upper extremity strength, adaptive techniques and equipment, NMR.   Goals are: supervision to min assist. Therapy may proceed with showering this patient. 10. SLP will assess and treat for/with: cognition.  Goals are: supervision to min assist. 11. Case Management and Social Worker will assess and treat for psychological issues and discharge planning. 12. Team conference will be held weekly to assess progress toward goals and to determine barriers to discharge. 13. Patient will receive at least 3 hours of therapy per day at least 5 days per week. 14. ELOS: 14-20 days       15. Prognosis:  excellent   I have personally performed a face to face diagnostic evaluation of  this patient and formulated the key components of the plan.  Additionally, I have personally reviewed laboratory data, imaging studies, as well as relevant notes and concur with the physician assistant's documentation above.  Meredith Staggers, MD, Mellody Drown     Bary Leriche, PA-C 04/14/2019

## 2019-04-14 NOTE — Care Management Important Message (Signed)
Important Message  Patient Details  Name: Tiffany Gill MRN: [id number] Date of Birth: 07/13/1941   Medicare Important Message Given:  Yes     Memory Argue 04/14/2019, 3:49 PM

## 2019-04-14 NOTE — Progress Notes (Signed)
Inpatient Rehabilitation Admissions Coordinator  Patient did well with therapy session. Patient and spouse in agreement to admit to CIR today. I contacted Maudie Mercury, NP for Dr. Saintclair Halsted and she will arranged d/c to CIR.  Danne Baxter, RN, MSN Rehab Admissions Coordinator [phone] 04/14/2019 1:39 PM

## 2019-04-14 NOTE — H&P (Signed)
Physical Medicine and Rehabilitation Admission H&P    CC: Functional deficits due to Lumbar spondylolisthesis with radiculopathy.    HPI:  Tiffany Gill is a 78 year old female with history of HTN, MDD, memory loss, LBP radiating to LLE>RLE due to spondylolisthesis with facet arthropathy and severe spinal stenosis affecting L4 and L5 nerve roots s/p 20 for L4-L5 decompressive laminectomy with fusion 03/17/19 who started developing progressive LBP and hip pain with follow up X rays in office revealing subsistence of spacer.  She was admitted on 03/24/19 for work up and pain management. CT spine done revealing subsidence of right spacer into superior endplate of L5 but overall stable hardware. 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 She did sustained a fall striking her head on 07/04 with subsequent occipital laceration.  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 CT left hip was negative for fracture.   She continued to have issues with delirium and buttock pain therefore hospitalist were consulted for input she was treated with fluid bolus with improvement in renal status and mentation. They recommended MRI lumbar spine which was done 04/02/19 showing severe thecal sac effacement L4/L5 due to listhesis with epidural collection at L5 and subdural collection from L2-L4 with displacement. Head CT repeated and was negative for bleed. Medications adjusted to help with pain management but she continued to have pain and anxiety limiting overall mobility. After discussion with husband, she was taken by to OR for re-exploration of L4/L5 with removal of residual spinous process and redo foraminotomies with screw fixation L3-S1 on 04/08/19  by Dr. Saintclair Halsted.  Post op, she has had great improvement in pain and mild left foot drop  reported to be stable. MD recommended CIR due to functional decline.     Review of Systems  Constitutional: Negative for chills, fever and malaise/fatigue.  HENT: Positive for hearing loss. Negative for tinnitus.   Eyes: Negative for blurred vision.  Respiratory: Negative for cough and hemoptysis.   Cardiovascular: Negative for chest pain, palpitations and leg swelling.  Gastrointestinal: Negative for constipation.  Genitourinary: Negative for dysuria and urgency.  Musculoskeletal: Positive for falls (last 6 months ago down some stairs) and myalgias.  Skin: Negative for itching and rash.  Neurological: Positive for weakness. Negative for dizziness, sensory change and headaches.  Psychiatric/Behavioral: Positive for memory loss. Negative for depression. The patient is nervous/anxious.       Past Medical History:  Diagnosis Date   Anxiety    Gait disorder    High cholesterol    Hypertension    Hypothyroidism    Major depression in partial remission ([place])    Memory loss    Osteoarthritis     Past Surgical History:  Procedure Laterality Date   DECOMPRESSIVE LUMBAR LAMINECTOMY LEVEL 4  02/2019   PARATHYROIDECTOMY      Family History  Problem Relation Age of Onset   Stroke Mother    Heart disease Father    Heart disease Brother     Social History: Married. 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 She reports that she has never smoked. She has never used smokeless tobacco. She reports current alcohol --liquor about 5 X week. She reports that she does not use drugs.     Allergies  Allergen Reactions   Metronidazole Anaphylaxis and Other (See  Comments)    Seizure     Medications Prior to Admission  Medication Sig Dispense Refill   amLODipine (NORVASC) 2.5 MG tablet Take 2.5 mg by mouth daily.     ARIPiprazole (ABILIFY) 2 MG tablet Take 2 mg by mouth daily.     buPROPion (WELLBUTRIN XL) 300 MG 24 hr tablet Take 300 mg by mouth daily.      Cholecalciferol (VITAMIN D3) 25 MCG (1000 UT) CAPS Take 1,000 Units by mouth daily.     clonazePAM (KLONOPIN) 0.5 MG tablet Take 0.5 mg by mouth 3 (three) times daily as needed for anxiety.      DULoxetine (CYMBALTA) 60 MG capsule Take 60 mg by mouth daily.     HYDROcodone-acetaminophen (NORCO) 10-325 MG tablet Take 1 tablet by mouth every 6 (six) hours as needed for moderate pain.     ibandronate (BONIVA) 150 MG tablet Take 150 mg by mouth every 30 (thirty) days.     L-Methylfolate-Algae (DEPLIN 15) 15-90.314 MG CAPS Take 15 mg by mouth daily.     losartan (COZAAR) 50 MG tablet Take 50 mg by mouth daily.     pravastatin (PRAVACHOL) 40 MG tablet Take 40 mg by mouth every evening.     predniSONE (DELTASONE) 10 MG tablet Take 10 mg by mouth daily with breakfast.     SYNTHROID 100 MCG tablet Take 100 mcg by mouth daily before breakfast.     zolpidem (AMBIEN) 5 MG tablet Take 5 mg by mouth at bedtime as needed for sleep.      Drug Regimen Review  Drug regimen was reviewed and remains appropriate with no significant issues identified  Home: Home Living Family/patient expects to be discharged to:: Private residence Living Arrangements: Spouse/significant other Available Help at Discharge: Family, Available 24 hours/day Type of Home: House Home Access: Stairs to enter [employer] of Steps: 3 Entrance Stairs-Rails: Can reach both Home Layout: Two level, Full bath on main level, Able to live on main level with bedroom/bathroom Alternate Level Stairs-Number of Steps: full flight Alternate Level Stairs-Rails: Can reach both Bathroom Shower/Tub: Gaffer, Door [employer] Toilet: Standard Home Equipment: Environmental consultant - 2 wheels   Functional History: Prior Function Level of Independence: Needs assistance Gait / Transfers Assistance Needed: ambulating with RW ADL's / Homemaking Assistance Needed: requiring assist with donning/doffing brace and LB dressing as pt iwth LB  weakness. Comments: Had been using RW for mobility.   Functional Status:  Mobility: Bed Mobility Overal bed mobility: Needs Assistance Bed Mobility: Rolling, Sidelying to Sit Rolling: Min assist Sidelying to sit: Max assist, +2 for physical assistance Supine to sit: Mod assist, +2 for physical assistance Sit to sidelying: +2 for physical assistance, +2 for safety/equipment, Mod assist General bed mobility comments: Rolling to left side with cues for bending contralateral knee, reaching with arm for bed rails, minA to facilitate. MaxA + 2 for sidelying > sit with assist for legs off edge of bed and trunk elevation Transfers Overall transfer level: Needs assistance Equipment used: Rolling walker (2 wheeled) Transfers: Sit to/from Stand, Stand Pivot Transfers Sit to Stand: Mod assist, +2 physical assistance Stand pivot transfers: Max assist, +2 physical assistance General transfer comment: ModA + 2 to stand from edge of bed, maxA + 2 to pivot towards left side from bed to recliner. Very narrow BOS, posterior lean, cues for motor initiation/coordination  Ambulation/Gait Ambulation/Gait assistance: Min assist, Mod assist Gait Distance (Feet): 45 Feet Assistive device: Rolling walker (2 wheeled) Gait Pattern/deviations: Step-through pattern, Decreased  step length - right, Decreased step length - left, Narrow base of support, Decreased dorsiflexion - left, Decreased weight shift to left General Gait Details:  alot of time spent encouraging pt to attempt gait training; pt declining and became extremely anxious and then transport arrived to take pt for CT  Gait velocity: Decreased     ADL: ADL Overall ADL's : Needs assistance/impaired Eating/Feeding: Minimal assistance Eating/Feeding Details (indicate cue type and reason): pt continues to require tactile cues to minA for hand eye coordination for drinking task.  Grooming: Set up, Sitting Grooming Details (indicate cue type and reason):  brushing hair; pt refusing to brush teeth at this time Upper Body Bathing: Moderate assistance, Sitting Lower Body Bathing: Maximal assistance, Sitting/lateral leans, Sit to/from stand Lower Body Bathing Details (indicate cue type and reason): unable to process LB AE even as a review from earlier this week, Upper Body Dressing : Moderate assistance, Sitting Upper Body Dressing Details (indicate cue type and reason): Pt doffing brace with  modA and wanting to lean back. Lower Body Dressing: Total assistance, Bed level Lower Body Dressing Details (indicate cue type and reason): Pt required max A and cues to iniate donning sock, but unable to follow through with activity, and unable to sustain attention   Toilet Transfer: Moderate assistance Toileting- Clothing Manipulation and Hygiene: Total assistance Functional mobility during ADLs: Maximal assistance, +2 for physical assistance, +2 for safety/equipment General ADL Comments: pt only able to tolerate bed mobility, sitting EOB for short period of time; spouse present during session, both pt and spouse report MD states pt should "take it slow"   Cognition: Cognition Overall Cognitive Status: Impaired/Different from baseline Orientation Level: Oriented X4 Cognition Arousal/Alertness: Awake/alert Behavior During Therapy: WFL for tasks assessed/performed Overall Cognitive Status: Impaired/Different from baseline Area of Impairment: Awareness, Memory, Problem solving, Safety/judgement Orientation Level: Disoriented to, Place, Time, Situation Current Attention Level: Focused Memory: Decreased recall of precautions Following Commands: Follows one step commands inconsistently, Follows one step commands with increased time Safety/Judgement: Decreased awareness of safety, Decreased awareness of deficits Awareness: Intellectual Problem Solving: Decreased initiation, Difficulty sequencing, Requires verbal cues, Requires tactile cues, Slow  processing General Comments: Excited to get to the chair today, needs max cues for sequencing/problem solving   Blood pressure (!) 114/48, pulse 90, temperature 98.3 F (36.8 C), temperature source Oral, resp. rate 18, height 5' (1.524 m), weight 56.1 kg, SpO2 96 %. Physical Exam  Nursing note and vitals reviewed. Constitutional: She appears well-developed and well-nourished.  Anxious appearing female. NAD  HENT:  Head: Normocephalic.  Eyes: Pupils are equal, round, and reactive to light. EOM are normal.  Neck: Normal range of motion. No thyromegaly present.  Cardiovascular: Normal rate and regular rhythm. Exam reveals no friction rub.  No murmur heard. Respiratory: No respiratory distress. She has no wheezes.  GI: Soft. Bowel sounds are normal. She exhibits no distension. There is no abdominal tenderness.  Musculoskeletal:     Comments: Well healed long scar on left tibia. Multiple ecchymotic areas BUE.   Neurological: She is alert.  Alert and oriented to person, month, year, reason she's in the hospital. Speech clear. Delayed processing at times, somewhat tangential. UE 4-/5 prox to distal. LE 2/5 HF, 3-/5 KE and 4/5 ADF/PF. Pain inhibiting at times. no focal sensory findings. DTR's 1+.   Skin:  Back incision with honeycomb dressing in place.   Psychiatric:  Flat but overall pleasant and cooperative.     No results found for this or any  previous visit (from the past 48 hour(s)). No results found.     Medical Problem List and Plan: 1.  Functional deficits and lower extremity weakness  secondary to lumbar stenosis and poly-radiculopathy s/p decompression and fusion with subsequent re-do  -admit to inpatient rehab 2.  Antithrombotics: -DVT/anticoagulation:  Pharmaceutical: Heparin  -antiplatelet therapy: N/A 3. Pain Management: Will continue oxycodone and flexeril prn--  -schedule oxycodone prior to AM therapies to help with tolerance as pain has been a major inhibiting  factor. Pt/husband in agreement  -d/c hydrocodone and robaxin to clear medicaiton list.   -Will continue lidocaine patch to left hip. 4. Mood: Team to provide ego support. LCSW to follow for evaluation and support when appropriate.  2  -antipsychotic agents: Continue Abilify.  5. Neuropsych: This patient is not fully capable of making decisions on her own behalf. 6. Skin/Wound Care: Routine pressure relief measures.  7. Fluids/Electrolytes/Nutrition: Monitor I/O. Check lytes in am.  8. HTN: Monitor BP tid--continue Cozaar and Norvasc.  9. Acute on chronic renal failure?: SCr was around 1.0 prior to admission. Recheck labs in am--avoid hypotension. Monitor SCr which is trending upwards--question hypoperfusion.  10. Delirium: Keep lights on during the day --monitor sleep wake cycle and set schedule.   -careful with opiates 11. MDD with anxiety: Was undergoing ECT at [facility] every 3 months  (prior to start of pandemic) Continue Abilify, Cymbalta and Wellbutrin with klonopin prn for anxiety.    12. ABLA: Monitor for signs of bleeding. Will recheck CBC in am. 13. Leucocytosis: Monitor for fevers and other signs of infection. Likely due ot low dose prednisone.       Bary Leriche, PA-C 04/14/2019

## 2019-04-14 NOTE — Progress Notes (Signed)
Occupational Therapy Treatment Patient Details Name: Tiffany Gill MRN: [id number] DOB: [birth year] Today's Date: 04/14/2019    History of present illness Pt is a 78 y/o female admitted secondary to worsening back pain and LE pain following recent PLIF. PMH includes anxiety, and HTN. Pt s/p revision L4-5 posterior lumbar fusion, pedicle screw fixation L3-S1 7/14.    OT comments  This 78 yo female admitted and underwent above worked really hard today and made progress with bed mobility, sit>stand, ambulation, transfers, sitting balance, and grooming at EOB. She will continue to benefit from acute OT with follow up now recommended for CIR.  Follow Up Recommendations  CIR;Supervision/Assistance - 24 hour    Equipment Recommendations  Other (comment)(TBD next venue)       Precautions / Restrictions Precautions Precautions: Fall;Back Required Braces or Orthoses: Spinal Brace Spinal Brace: Applied in sitting position;Lumbar corset       Mobility Bed Mobility Overal bed mobility: Needs Assistance Bed Mobility: Rolling;Sidelying to Sit Rolling: Min guard Sidelying to sit: Min assist          Transfers Overall transfer level: Needs assistance Equipment used: Rolling walker (2 wheeled) Transfers: Sit to/from Stand Sit to Stand: Mod assist;+2 physical assistance;Min assist         General transfer comment: intially lifting assist to stand, after three trials, closer to +2 min A for sit to stand from recliner    Balance Overall balance assessment: Needs assistance Sitting-balance support: Bilateral upper extremity supported;Feet supported Sitting balance-Leahy Scale: Poor Sitting balance - Comments: cues for anterior weight shift and occasional min to minguard A while combing hair and applying lipstick   Standing balance support: Bilateral upper extremity supported Standing balance-Leahy Scale: Poor Standing balance comment: initially +2 for balance due to leaning back  initially, then subsequent trials with standing pt with improved anterior weight shift                           ADL either performed or assessed with clinical judgement   ADL Overall ADL's : Needs assistance/impaired     Grooming: Minimal assistance;Brushing hair Grooming Details (indicate cue type and reason): applying make up (tendency for posterior lean)                   Toilet Transfer Details (indicate cue type and reason): Mod A +2  sit>stand with initial posterior lean, Mod A +2 stand pivot with small steps--both simulated from bed>recliner Toileting- Clothing Manipulation and Hygiene: Total assistance Toileting - Clothing Manipulation Details (indicate cue type and reason): Mod A +2 sit>stand and Mod A to maintain standing             Vision Baseline Vision/History: No visual deficits Patient Visual Report: No change from baseline            Cognition Arousal/Alertness: Awake/alert Behavior During Therapy: WFL for tasks assessed/performed Overall Cognitive Status: Impaired/Different from baseline Area of Impairment: Memory;Safety/judgement                   Current Attention Level: Focused   Following Commands: Follows one step commands consistently Safety/Judgement: Decreased awareness of safety                    General Comments spouse in the room and encouraging    Pertinent Vitals/ Pain       Pain Assessment: Faces Faces Pain Scale: Hurts a little bit Pain Location: back with  mobility Pain Descriptors / Indicators: Sore Pain Intervention(s): Monitored during session;Repositioned;Premedicated before session  Home Living                           Bathroom Accessibility: Yes How Accessible: Accessible via walker        Lives With: Spouse        Frequency  Min 2X/week        Progress Toward Goals  OT Goals(current goals can now be found in the care plan section)  Progress towards OT goals:  Progressing toward goals     Plan Discharge plan needs to be updated    Co-evaluation    PT/OT/SLP Co-Evaluation/Treatment: Yes(partial) Reason for Co-Treatment: To address functional/ADL transfers;For patient/therapist safety PT goals addressed during session: Mobility/safety with mobility;Balance;Proper use of DME OT goals addressed during session: ADL's and self-care;Strengthening/ROM      AM-PAC OT "6 Clicks" Daily Activity     Outcome Measure   Help from another person eating meals?: A Little Help from another person taking care of personal grooming?: A Lot Help from another person toileting, which includes using toliet, bedpan, or urinal?: A Lot Help from another person bathing (including washing, rinsing, drying)?: A Lot Help from another person to put on and taking off regular upper body clothing?: A Lot Help from another person to put on and taking off regular lower body clothing?: Total 6 Click Score: 12    End of Session Equipment Utilized During Treatment: Gait belt;Rolling walker;Back brace  OT Visit Diagnosis: Unsteadiness on feet (R26.81);Muscle weakness (generalized) (M62.81);Pain Pain - part of body: (back)   Activity Tolerance Patient tolerated treatment well   Patient Left in chair;with call bell/phone within reach;with chair alarm set;with family/visitor present           Time: 1310-1333 OT Time Calculation (min): 23 min  Charges: OT General Charges $OT Visit: 1 Visit OT Treatments $Self Care/Home Management : 8-22 mins  Tiffany Gill, OTR/L [facility] [employer] Pager [phone] Office [phone]      Tiffany Gill 04/14/2019, 3:01 PM

## 2019-04-14 NOTE — Progress Notes (Signed)
Cristina Gong, RN  Rehab Admission Coordinator  Physical Medicine and Rehabilitation  PMR Pre-admission  Addendum  Date of Service:  04/14/2019 12:51 PM      Related encounter: Admission (Current) from 03/24/2019 in [place] [facility]         Show:Clear all [x] Manual[x] Template[x] Copied  Added by: [x] Cristina Gong, RN  [] Hover for details PMR Admission Coordinator Pre-Admission Assessment  Patient: Tiffany Gill is an 78 y.o., female MRN: [id number] DOB: 1941-01-21 Height: 5' (152.4 cm) Weight: 56.1 kg                                                                                                                                                  Insurance Information HMO:     PPO:      PCP:      IPA:      80/20:      OTHER: no HMO PRIMARY: Medicare a and b      Policy#: [id number]      Subscriber: pt Benefits:  Phone #: passport one online     Name: 7/20 Eff. Date: a 11/24/2006 b 05/27/2007     Deduct: $1408      Out of Pocket Max: none      Life Max: none CIR: 100%      SNF: 20 full days Outpatient: 80%     Co-Pay: 20% Home Health: 100%      Co-Pay: none DME: 80%     Co-Pay: 20% Providers: pt choice  SECONDARY: BCBS supplement      Policy#: [id number]      Subscriber: pt  Medicaid Application Date:       Case Manager:  Disability Application Date:       Case Worker:   The "Data Collection Information Summary" for patients in Inpatient Rehabilitation Facilities with attached "Privacy Act [place] Records" was provided and verbally reviewed with: Patient and Family  Emergency Contact Information         Contact Information    Name Relation Home Work Mobile   Hussar,Dennis A Spouse [phone]       Current Medical History  Patient Admitting Diagnosis: lumbar spondylolisthesis with radiculopathy  History of Present Illness: 78 y.o.femalewith history of anxiety, depression s/p ECT,memory loss, chronic pain,  lumbar spondylolisthesis with radiculopathy s/p decompressive laminectomy L4/5 by Dr. Saintclair Halsted on6/23/2020.She was discharged to home the next day and has had progressive worsening of LBP with bilateral hip pain. She was seen at [facility] office and X rays showed concerns of cage displacement. She was admitted 03/24/19 for work up and CT spine done revealing subsidence of right spacer into superior end plate of L5 with remaining hardware in satisfactory position.She was treated with Decadron with improvement in pain, however developed agitation, which was thought to be due to Neurontin. Per reports, patient  had fall on to left hip PTA and CT hip7/3/2020showed no acute changes or focal fluid collection. She was found on the floor 7/4/2020with posterior head laceration and CT head/neck done for work up. CT head did not show any acute intracranial abnormalities. Orientation waxing and waning with medication changes made to decrease causes.Spouse approved to visit to assist with reorientation.  Further imaging reviewed by Dr. Saintclair Halsted. On 7/14 patient underwent reexploration of L4-5 posterior lumbar interbody fusion with removal of L4-5 cortical screws and removal of bilateral interbody cages.   Past Medical History      Past Medical History:  Diagnosis Date  . Anxiety   . Gait disorder   . High cholesterol   . Hypertension   . Hypothyroidism   . Major depression in partial remission ([place])   . Memory loss   . Osteoarthritis     Family History  family history includes Heart disease in her brother and father; Stroke in her mother.  Prior Rehab/Hospitalizations:  Has the patient had prior rehab or hospitalizations prior to admission? Yes  Has the patient had major surgery during 100 days prior to admission? Yes  Current Medications   Current Facility-Administered Medications:  .  0.9 %  sodium chloride infusion, 250 mL, Intravenous, Continuous, Kary Kos, MD, Last Rate: 11 mL/hr at  04/10/19 0800 .  acetaminophen (TYLENOL) tablet 650 mg, 650 mg, Oral, Q4H PRN **OR** acetaminophen (TYLENOL) suppository 650 mg, 650 mg, Rectal, Q4H PRN, Kary Kos, MD .  acetaminophen (TYLENOL) tablet 650 mg, 650 mg, Oral, Q6H WA, Judith Part, MD, 650 mg at 04/14/19 1419 .  alum & mag hydroxide-simeth (MAALOX/MYLANTA) 200-200-20 MG/5ML suspension 30 mL, 30 mL, Oral, Q6H PRN, Kary Kos, MD, 30 mL at 04/09/19 1634 .  amLODipine (NORVASC) tablet 5 mg, 5 mg, Oral, Daily, Mariel Aloe, MD, 5 mg at 04/14/19 0811 .  ARIPiprazole (ABILIFY) tablet 2 mg, 2 mg, Oral, Daily, Kary Kos, MD, 2 mg at 04/14/19 0932 .  bisacodyl (DULCOLAX) EC tablet 5 mg, 5 mg, Oral, Daily PRN, Meyran, [name], NP, 5 mg at 04/12/19 1331 .  bisacodyl (DULCOLAX) suppository 10 mg, 10 mg, Rectal, Daily PRN, Meyran, [name], NP, 10 mg at 04/12/19 2032 .  bupivacaine liposome (EXPAREL) 1.3 % injection 266 mg, 20 mL, Infiltration, Once, Kary Kos, MD .  buPROPion (WELLBUTRIN XL) 24 hr tablet 300 mg, 300 mg, Oral, Daily, Kary Kos, MD, 300 mg at 04/14/19 0810 .  cholecalciferol (VITAMIN D3) tablet 1,000 Units, 1,000 Units, Oral, Daily, Kary Kos, MD, 1,000 Units at 04/14/19 [phone] .  clonazePAM (KLONOPIN) tablet 0.5 mg, 0.5 mg, Oral, TID PRN, Kary Kos, MD, 0.5 mg at 04/13/19 2057 .  cyclobenzaprine (FLEXERIL) tablet 10 mg, 10 mg, Oral, TID PRN, Kary Kos, MD, 10 mg at 04/13/19 2054 .  DULoxetine (CYMBALTA) DR capsule 60 mg, 60 mg, Oral, Daily, Kary Kos, MD, 60 mg at 04/14/19 0811 .  feeding supplement (ENSURE ENLIVE) (ENSURE ENLIVE) liquid 237 mL, 237 mL, Oral, BID BM, Kary Kos, MD, 237 mL at 04/14/19 0932 .  feeding supplement (PRO-STAT SUGAR FREE 64) liquid 30 mL, 30 mL, Oral, Daily, Kary Kos, MD, 30 mL at 04/14/19 0812 .  heparin injection 5,000 Units, 5,000 Units, Subcutaneous, Q8H, Judith Part, MD, 5,000 Units at 04/14/19 0542 .  HYDROcodone-acetaminophen (NORCO) 10-325 MG per tablet 1  tablet, 1 tablet, Oral, Q6H PRN, Kary Kos, MD, 1 tablet at 04/14/19 1045 .  HYDROmorphone (DILAUDID) injection 0.5 mg, 0.5  mg, Intravenous, Q2H PRN, Kary Kos, MD, 0.5 mg at 04/11/19 0840 .  lactated ringers infusion, , Intravenous, Continuous, Effie Berkshire, MD, Stopped at 04/08/19 1802 .  levothyroxine (SYNTHROID) tablet 100 mcg, 100 mcg, Oral, QAC breakfast, Kary Kos, MD, 100 mcg at 04/14/19 0542 .  lidocaine (LIDODERM) 5 % 1 patch, 1 patch, Transdermal, Q24H, Ostergard, Joyice Faster, MD, 1 patch at 04/14/19 1240 .  losartan (COZAAR) tablet 50 mg, 50 mg, Oral, Daily, Kary Kos, MD, 50 mg at 04/14/19 2035 .  menthol-cetylpyridinium (CEPACOL) lozenge 3 mg, 1 lozenge, Oral, PRN **OR** phenol (CHLORASEPTIC) mouth spray 1 spray, 1 spray, Mouth/Throat, PRN, Kary Kos, MD .  methocarbamol (ROBAXIN) tablet 500 mg, 500 mg, Oral, Q6H PRN, Meyran, [name], NP, 500 mg at 04/13/19 2054 .  ondansetron (ZOFRAN) tablet 4 mg, 4 mg, Oral, Q6H PRN **OR** ondansetron (ZOFRAN) injection 4 mg, 4 mg, Intravenous, Q6H PRN, Kary Kos, MD .  oxyCODONE (Oxy IR/ROXICODONE) immediate release tablet 10 mg, 10 mg, Oral, Q3H PRN, Kary Kos, MD, 10 mg at 04/14/19 1238 .  pantoprazole (PROTONIX) EC tablet 40 mg, 40 mg, Oral, QHS, Rumbarger, Valeda Malm, RPH, 40 mg at 04/13/19 2056 .  polyethylene glycol (MIRALAX / GLYCOLAX) packet 17 g, 17 g, Oral, Daily PRN, Kary Kos, MD, 17 g at 04/12/19 1116 .  pravastatin (PRAVACHOL) tablet 40 mg, 40 mg, Oral, QPM, Kary Kos, MD, 40 mg at 04/13/19 1641 .  predniSONE (DELTASONE) tablet 10 mg, 10 mg, Oral, Q breakfast, Kary Kos, MD, 10 mg at 04/14/19 0810 .  sodium chloride flush (NS) 0.9 % injection 10-40 mL, 10-40 mL, Intracatheter, Q12H, Kary Kos, MD, 10 mL at 04/14/19 5974 .  sodium chloride flush (NS) 0.9 % injection 10-40 mL, 10-40 mL, Intracatheter, PRN, Kary Kos, MD, 10 mL at 04/02/19 2239 .  sodium chloride flush (NS) 0.9 % injection 3 mL, 3 mL, Intravenous, Q12H,  Kary Kos, MD, 3 mL at 04/14/19 0813 .  sodium chloride flush (NS) 0.9 % injection 3 mL, 3 mL, Intravenous, PRN, Kary Kos, MD  Patients Current Diet:     Diet Order                  Diet - low sodium heart healthy         Diet regular Room service appropriate? Yes with Assist; Fluid consistency: Thin  Diet effective now               Precautions / Restrictions Precautions Precautions: Fall, Back Precaution Booklet Issued: No Precaution Comments: pt has no recall of back precautions Spinal Brace: Applied in sitting position, Lumbar corset Restrictions Weight Bearing Restrictions: No   Has the patient had 2 or more falls or a fall with injury in the past year?Yes Fell in hospital with injury on 7/4 on [address]  Prior Activity Level Community (5-7x/wk): independent, driving, without AD prior to first surgery  Prior Functional Level Prior Function Level of Independence: Needs assistance Gait / Transfers Assistance Needed: ambulating with RW ADL's / Homemaking Assistance Needed: requiring assist with donning/doffing brace and LB dressing as pt iwth LB weakness. Comments: Had been using RW for mobility.   Self Care: Did the patient need help bathing, dressing, using the toilet or eating?  Independent  Indoor Mobility: Did the patient need assistance with walking from room to room (with or without device)? Independent  Stairs: Did the patient need assistance with internal or external stairs (with or without device)? Independent  Functional Cognition: Did the patient need help planning regular tasks such as shopping or remembering to take medications? [place] Devices/Equipment: Gilford Rile (specify type) Home Equipment: Walker - 2 wheels  Prior Device Use: Indicate devices/aids used by the patient prior to current illness, exacerbation or injury? None of the above  Current Functional Level  Cognition  Overall Cognitive Status: Impaired/Different from baseline Current Attention Level: Focused Orientation Level: Oriented X4 Following Commands: Follows one step commands consistently Safety/Judgement: Decreased awareness of safety General Comments: Excited to get to the chair today, needs max cues for sequencing/problem solving    Extremity Assessment (includes Sensation/Coordination)  Upper Extremity Assessment: Generalized weakness  Lower Extremity Assessment: Defer to PT evaluation LLE Deficits / Details: weakness    ADLs  Overall ADL's : Needs assistance/impaired Eating/Feeding: Minimal assistance Eating/Feeding Details (indicate cue type and reason): pt continues to require tactile cues to minA for hand eye coordination for drinking task.  Grooming: Minimal assistance, Brushing hair Grooming Details (indicate cue type and reason): applying make up (tendency for posterior lean) Upper Body Bathing: Moderate assistance, Sitting Lower Body Bathing: Maximal assistance, Sitting/lateral leans, Sit to/from stand Lower Body Bathing Details (indicate cue type and reason): unable to process LB AE even as a review from earlier this week, Upper Body Dressing : Moderate assistance, Sitting Upper Body Dressing Details (indicate cue type and reason): Pt doffing brace with  modA and wanting to lean back. Lower Body Dressing: Total assistance, Bed level Lower Body Dressing Details (indicate cue type and reason): Pt required max A and cues to iniate donning sock, but unable to follow through with activity, and unable to sustain attention   Toilet Transfer: Moderate assistance Toilet Transfer Details (indicate cue type and reason): Mod A +2  sit>stand with initial posterior lean, Mod A +2 stand pivot with small steps--both simulated from bed>recliner Toileting- Clothing Manipulation and Hygiene: Total assistance Toileting - Clothing Manipulation Details (indicate cue type and reason):  Mod A +2 sit>stand and Mod A to maintain standing Functional mobility during ADLs: Maximal assistance, +2 for physical assistance, +2 for safety/equipment General ADL Comments: pt only able to tolerate bed mobility, sitting EOB for short period of time; spouse present during session, both pt and spouse report MD states pt should "take it slow"     Mobility  Overal bed mobility: Needs Assistance Bed Mobility: Rolling, Sidelying to Sit Rolling: Min guard Sidelying to sit: Min assist Supine to sit: Mod assist, +2 for physical assistance Sit to sidelying: +2 for physical assistance, +2 for safety/equipment, Mod assist General bed mobility comments: Rolling to left side with cues for bending contralateral knee, reaching with arm for bed rails, minA to facilitate. MaxA + 2 for sidelying > sit with assist for legs off edge of bed and trunk elevation    Transfers  Overall transfer level: Needs assistance Equipment used: Rolling walker (2 wheeled) Transfers: Sit to/from Stand Sit to Stand: Mod assist, +2 physical assistance, Min assist Stand pivot transfers: Max assist, +2 physical assistance General transfer comment: intially lifting assist to stand, after three trials, closer to +2 min A for sit to stand from recliner    Ambulation / Gait / Stairs / Wheelchair Mobility  Ambulation/Gait Ambulation/Gait assistance: Min assist, +2 physical assistance, Mod assist Gait Distance (Feet): 5 Feet Assistive device: Rolling walker (2 wheeled) Gait Pattern/deviations: Step-to pattern, Decreased step length - left, Shuffle, Decreased dorsiflexion - left General Gait Details: attempting to scoot L foot  on floor, when she picked it up it landed on the other foot Gait velocity: Decreased     Posture / Balance Dynamic Sitting Balance Sitting balance - Comments: cues for anterior weight shift and occasional min to minguard A while combing hair and applying lipstick Balance Overall balance assessment:  Needs assistance Sitting-balance support: Bilateral upper extremity supported, Feet supported Sitting balance-Leahy Scale: Poor Sitting balance - Comments: cues for anterior weight shift and occasional min to minguard A while combing hair and applying lipstick Postural control: Posterior lean Standing balance support: Bilateral upper extremity supported Standing balance-Leahy Scale: Poor Standing balance comment: initially +2 for balance due to leaning back initially, then subsequent trials with standing pt with improved anterior weight shift    Special needs/care consideration BiPAP/CPAP n/a CPM n/a Continuous Drip IV yes with implantable port to right chest Dialysis n/a Life Vest n/a Oxygen n/a Special Bed fall risk; low bed with floor pads Trach Size n/a Wound Vac n/a Skin surgical incisions Bowel mgmt: continent 7/20 Bladder mgmt: external catheter Diabetic mgmt n/a Behavioral consideration  Has been disoriented due to meds which is resolving. Spouse, Simona Huh has been allowed visitation due to her severe confusion. He is aware that this will be re assessed first 24 to 48 hrs at [facility] for continued need. Chemo/radiation n/a   Previous Home Environment  Living Arrangements: Spouse/significant other  Lives With: Spouse Available Help at Discharge: Family, Available 24 hours/day Type of Home: House Home Layout: Two level, Full bath on main level, Able to live on main level with bedroom/bathroom Alternate Level Stairs-Rails: Can reach both Alternate Level Stairs-Number of Steps: full flight Home Access: Stairs to enter Entrance Stairs-Rails: Can reach both Entrance Stairs-Number of Steps: 3 Bathroom Shower/Tub: Gaffer, Charity fundraiser: Standard Bathroom Accessibility: Yes How Accessible: Accessible via walker [place]: Yes Type of Home Care Services: Home PT, [place] (if known): [place] after first surgery  Discharge Living Setting  Plans for Discharge Living Setting: Patient's home, Lives with (comment)(spouse) Type of Home at Discharge: House Discharge Home Layout: Two level, Full bath on main level, Able to live on main level with bedroom/bathroom Alternate Level Stairs-Rails: Right, Left, Can reach both Alternate Level Stairs-Number of Steps: flight Discharge Home Access: Stairs to enter Entrance Stairs-Rails: Right, Left, Can reach both Entrance Stairs-Number of Steps: 3 Discharge Bathroom Shower/Tub: Walk-in shower, Door Discharge Bathroom Toilet: Standard Discharge Bathroom Accessibility: Yes How Accessible: Accessible via walker Does the patient have any problems obtaining your medications?: No  Social/Family/Support Systems Patient Roles: Spouse Contact Information: spouse Simona Huh; cell phone receives text only, can leave voicemail on home land line Anticipated Caregiver: spouse and a firend coming from [name] to help once she is discharged Anticipated Caregiver's Contact Information: cell [phone] home [phone] Ability/Limitations of Caregiver: spouse with no limitations Caregiver Availability: 24/7 Discharge Plan Discussed with Primary Caregiver: Yes Is Caregiver In Agreement with Plan?: Yes Does Caregiver/Family have Issues with Lodging/Transportation while Pt is in Rehab?: No  Goals/Additional Needs Patient/Family Goal for Rehab: superivison to min PT, OT, and SLP Expected length of stay: [facility]OS 14 to 20 days Special Service Needs: Spouse has been approved to visit on acute; he is aware that once admitted to CIR, that visitaion will be reassessed for need Pt/Family Agrees to Admission and willing to participate: Yes Program Orientation Provided & Reviewed with Pt/Caregiver Including Roles  & Responsibilities: Yes  Decrease burden of Care through IP rehab admission: n/a  Possible need for  SNF placement upon discharge:not anticipated  Patient Condition: This patient's medical and  functional status has changed since the consult dated 03/31/2019 in which the Rehabilitation Physician determined and documented that the patient was potentially appropriate for intensive rehabilitative care in an inpatient rehabilitation facility. Issues have been addressed and update has been discussed with Dr. Naaman Plummer and patient now appropriate for inpatient rehabilitation. Will admit to inpatient rehab today.   Preadmission Screen Completed By:  Cleatrice Burke, RN MSN, 04/14/2019 3:24 PM ______________________________________________________________________   Discussed status with Dr. Naaman Plummer on 04/14/2019 at 1455 and received approval for admission today.  Admission Coordinator:  Cleatrice Burke RN MSN time [id number] Date 04/14/2019    Cosigned by: Meredith Staggers, MD at 04/14/2019 3:34 PM  Revision History

## 2019-04-15 ENCOUNTER — Inpatient Hospital Stay (HOSPITAL_COMMUNITY): Payer: Medicare Other

## 2019-04-15 ENCOUNTER — Inpatient Hospital Stay (HOSPITAL_COMMUNITY): Payer: Medicare Other | Admitting: Speech Pathology

## 2019-04-15 ENCOUNTER — Inpatient Hospital Stay (HOSPITAL_COMMUNITY): Payer: Medicare Other | Admitting: Occupational Therapy

## 2019-04-15 DIAGNOSIS — M5416 Radiculopathy, lumbar region: Secondary | ICD-10-CM

## 2019-04-15 DIAGNOSIS — R0989 Other specified symptoms and signs involving the circulatory and respiratory systems: Secondary | ICD-10-CM

## 2019-04-15 LAB — COMPREHENSIVE METABOLIC PANEL
ALT: 11 U/L (ref 0–44)
AST: 19 U/L (ref 15–41)
Albumin: 2.7 g/dL — ABNORMAL LOW (ref 3.5–5.0)
Alkaline Phosphatase: 86 U/L (ref 38–126)
Anion gap: 11 (ref 5–15)
BUN: 13 mg/dL (ref 8–23)
CO2: 21 mmol/L — ABNORMAL LOW (ref 22–32)
Calcium: 9.4 mg/dL (ref 8.9–10.3)
Chloride: 105 mmol/L (ref 98–111)
Creatinine, Ser: 0.97 mg/dL (ref 0.44–1.00)
GFR calc Af Amer: >60 mL/min (ref >60)
GFR calc non Af Amer: 56 mL/min — ABNORMAL LOW (ref >60)
Glucose, Bld: 83 mg/dL (ref 70–99)
Potassium: 4 mmol/L (ref 3.5–5.1)
Sodium: 137 mmol/L (ref 135–145)
Total Bilirubin: 0.6 mg/dL (ref 0.3–1.2)
Total Protein: 5.5 g/dL — ABNORMAL LOW (ref 6.5–8.1)

## 2019-04-15 LAB — CBC WITH DIFFERENTIAL/PLATELET
Abs Immature Granulocytes: 0.34 10*3/uL — ABNORMAL HIGH (ref 0.00–0.07)
Basophils Absolute: 0 10*3/uL (ref 0.0–0.1)
Basophils Relative: 0 %
Eosinophils Absolute: 0.3 10*3/uL (ref 0.0–0.5)
Eosinophils Relative: 4 %
HCT: 24.8 % — ABNORMAL LOW (ref 36.0–46.0)
Hemoglobin: 8 g/dL — ABNORMAL LOW (ref 12.0–15.0)
Immature Granulocytes: 4 %
Lymphocytes Relative: 22 %
Lymphs Abs: 1.8 10*3/uL (ref 0.7–4.0)
MCH: 32.9 pg (ref 26.0–34.0)
MCHC: 32.3 g/dL (ref 30.0–36.0)
MCV: 102.1 fL — ABNORMAL HIGH (ref 80.0–100.0)
Monocytes Absolute: 0.6 10*3/uL (ref 0.1–1.0)
Monocytes Relative: 8 %
Neutro Abs: 5.2 10*3/uL (ref 1.7–7.7)
Neutrophils Relative %: 62 %
Platelets: 415 10*3/uL — ABNORMAL HIGH (ref 150–400)
RBC: 2.43 MIL/uL — ABNORMAL LOW (ref 3.87–5.11)
RDW: 15.1 % (ref 11.5–15.5)
WBC: 8.3 10*3/uL (ref 4.0–10.5)
nRBC: 0.2 % (ref 0.0–0.2)

## 2019-04-15 MED ORDER — NONFORMULARY OR COMPOUNDED ITEM
15.0000 mg | Freq: Every day | Status: DC
  Administered 2019-04-15 – 2019-05-04 (×20): 15 mg via ORAL
  Filled 2019-04-15 (×20): qty 1

## 2019-04-15 MED ORDER — POLYSACCHARIDE IRON COMPLEX 150 MG PO CAPS
150.0000 mg | ORAL_CAPSULE | Freq: Two times a day (BID) | ORAL | Status: DC
  Administered 2019-04-15 – 2019-05-04 (×37): 150 mg via ORAL
  Filled 2019-04-15 (×38): qty 1

## 2019-04-15 NOTE — Progress Notes (Signed)
[facility] Individual Statement of Services  Patient Name:  Tiffany Gill  Date:  04/15/2019  Welcome to the [facility].  Our goal is to provide you with an individualized program based on your diagnosis and situation, designed to meet your specific needs.  With this comprehensive rehabilitation program, you will be expected to participate in at least 3 hours of rehabilitation therapies Monday-Friday, with modified therapy programming on the weekends.  Your rehabilitation program will include the following services:  Physical Therapy (PT), Occupational Therapy (OT), Speech Therapy (ST), 24 hour per day rehabilitation nursing, Neuropsychology, Case Management (Social Worker), Rehabilitation Medicine, Nutrition Services and Pharmacy Services  Weekly team conferences will be held on Wednesdays to discuss your progress.  Your Social Worker will talk with you frequently to get your input and to update you on team discussions.  Team conferences with you and your family in attendance may also be held.  Expected length of stay:  12 to 14 days  Overall anticipated outcome:  Supervision/contact guard assistance  Depending on your progress and recovery, your program may change. Your Social Worker will coordinate services and will keep you informed of any changes. Your Social Worker's name and contact numbers are listed  below.  The following services may also be recommended but are not provided by the [facility] will be made to provide these services after discharge if needed.  Arrangements include referral to agencies that provide these services.  Your insurance has been verified to be:  Medicare and [employer] Your primary doctor is:  Dr. Hulan Fess  Pertinent information will be shared with your  doctor and your insurance company.  Social Worker:  Alfonse Alpers, LCSW  [phone] or ([phone]  Information discussed with and copy given to patient by: Trey Sailors, 04/15/2019, 4:14 PM

## 2019-04-15 NOTE — Progress Notes (Addendum)
[facility] PHYSICAL MEDICINE & REHABILITATION PROGRESS NOTE   Subjective/Complaints:  No issues except pain, low BP yest met husband who brought in meds  ROS- neg CP SOB, N/V/D  Objective:   No results found. No results for input(s): WBC, HGB, HCT, PLT in the last 72 hours. No results for input(s): NA, K, CL, CO2, GLUCOSE, BUN, CREATININE, CALCIUM in the last 72 hours.  Intake/Output Summary (Last 24 hours) at 04/15/2019 0912 Last data filed at 04/15/2019 6269 Gross per 24 hour  Intake 200 ml  Output -  Net 200 ml     Physical Exam: Vital Signs Blood pressure 119/66, pulse 89, temperature 98.1 F (36.7 C), resp. rate 18, height 5' (1.524 m), weight 58.5 kg, SpO2 95 %.   General: No acute distress Mood and affect are appropriate Heart: Regular rate and rhythm no rubs murmurs or extra sounds Lungs: Clear to auscultation, breathing unlabored, no rales or wheezes Abdomen: Positive bowel sounds, soft nontender to palpation, nondistended Extremities: No clubbing, cyanosis, or edema  Neurologic: Cranial nerves II through XII intact, motor strength is 4/5 in bilateral deltoid, bicep, tricep, grip,Right  hip flexor, knee extensors, ankle dorsiflexor and plantar flexor 3- Left HF, 4- KE , 2- [place] DF Sensory exam normal sensation to light touch and proprioception in bilateral upper and lower extremities Cerebellar exam normal finger to nose to finger as well as heel to shin in bilateral upper and lower extremities Musculoskeletal: Full range of motion in all 4 extremities. No joint swelling   Assessment/Plan: 1. Functional deficits secondary to Lumbar radiculopathy with LLE weakness which require 3+ hours per day of interdisciplinary therapy in a comprehensive inpatient rehab setting.  Physiatrist is providing close team supervision and 24 hour management of active medical problems listed below.  Physiatrist and rehab team continue to assess barriers to discharge/monitor patient  progress toward functional and medical goals  Care Tool:  Bathing              Bathing assist       Upper Body Dressing/Undressing Upper body dressing        Upper body assist      Lower Body Dressing/Undressing Lower body dressing            Lower body assist       Toileting Toileting    Toileting assist Assist for toileting: Moderate Assistance - Patient 50 - 74%     Transfers Chair/bed transfer  Transfers assist           Locomotion Ambulation   Ambulation assist              Walk 10 feet activity   Assist           Walk 50 feet activity   Assist           Walk 150 feet activity   Assist           Walk 10 feet on uneven surface  activity   Assist           Wheelchair     Assist               Wheelchair 50 feet with 2 turns activity    Assist            Wheelchair 150 feet activity     Assist            Medical Problem List and Plan: 1.Functional deficits and lower extremity weaknesssecondary to lumbar  stenosis and poly-radiculopathy s/p decompression and L3-S1 fusion with subsequent re-do, also debilitated CIR PT, OT 2. Antithrombotics: -DVT/anticoagulation:Pharmaceutical:Heparin -antiplatelet therapy: N/A 3. Pain Management:Will continue oxycodone and flexeril prn-- -schedule oxycodone prior to AM therapies to help with tolerance as pain has been a major inhibiting factor. Pt/husband in agreement -d/c hydrocodone and robaxin to clear medicaiton list. -Will continue lidocaine patch to left hip. 4. Mood:Team to provide ego support. LCSW to follow for evaluation and support when appropriate.2 -antipsychotic agents: Continue Abilify. 5. Neuropsych: This patientis notfullycapable of making decisions onherown behalf. 6. Skin/Wound Care:Routine pressure relief measures. 7.  Fluids/Electrolytes/Nutrition:Monitor I/O. Check lytes in am.  8. HTN: Monitor BP tid--continue Cozaar and Norvasc.  Vitals:   04/14/19 1950 04/15/19 0454  BP: (!) 84/70 119/66  Pulse: 100 89  Resp: 17 18  Temp: 97.9 F (36.6 C) 98.1 F (36.7 C)  SpO2: 95% 95%  low BP yest pm, check ortho vitals 9. Acute on chronic renal failure?:SCr was around 1.0 prior to admission.Recheck labs in am--avoid hypotension. Monitor SCr which is trending upwards--question hypoperfusion.  10. Delirium: Keep lights on during the day --monitor sleep wake cycle and set schedule. -careful with opiates 11. MDD with anxiety:Was undergoing ECT at [facility] every 3 months (prior to start of pandemic) Continue Abilify, Cymbaltaand Wellbutrin with klonopin prn for anxiety.  12. ABLA: Monitor for signs of bleeding. Will recheck CBC in am. 13. Leucocytosis: Monitor for fevers and other signs of infection. Likely due ot low dose prednisone- afebrile since admit to rehab    LOS: 1 days A FACE TO [place] E Alverto Shedd 04/15/2019, 9:12 AM

## 2019-04-15 NOTE — Evaluation (Signed)
Physical Therapy Assessment and Plan  Patient Details  Name: Tiffany Gill MRN: [id number] Date of Birth: 1941-02-18  PT Diagnosis: Abnormal posture, Coordination disorder, Impaired cognition and Muscle weakness Rehab Potential: Good ELOS: 12-14 days   Today's Date: 04/15/2019 PT Individual Time: 0846-1000 PT Individual Time Calculation (min): 74 min    Problem List:  Patient Active Problem List   Diagnosis Date Noted  . Spondylolisthesis of lumbar region 04/14/2019  . Spondylisthesis 04/08/2019  . Back pain   . Anxiety and depression   . Chronic pain syndrome   . Acute blood loss anemia   . Stage 3 chronic kidney disease ([place])   . Spondylolisthesis at L4-L5 level 03/17/2019    Past Medical History:  Past Medical History:  Diagnosis Date  . Anxiety   . Gait disorder   . High cholesterol   . Hypertension   . Hypothyroidism   . Major depression in partial remission ([place])   . Memory loss   . Osteoarthritis    Past Surgical History:  Past Surgical History:  Procedure Laterality Date  . DECOMPRESSIVE LUMBAR LAMINECTOMY LEVEL 4  02/2019  . PARATHYROIDECTOMY      Assessment & Plan Clinical Impression: Patient is a 78 y.o. year old female with history of HTN, MDD, memory loss, LBP radiating to LLE>RLE due to spondylolisthesis with facet arthropathy and severe spinal stenosis affecting L4 and L5 nerve roots s/p 20 for L4-L5 decompressive laminectomy with fusion 03/17/19 who started developing progressive LBP and hip pain with follow up X rays in office revealing subsistence of spacer. She was admitted on 03/24/19 for work up and pain management. CT spine done revealing subsidence of right spacer into superior endplate of L5 but overall stable hardware. 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 She did sustained a fall striking her head on 07/04 with subsequent occipital  laceration. 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 CT left hip was negative for fracture.  She continued to have issues with delirium and buttock pain therefore hospitalist were consulted for input she was treated with fluid bolus with improvement in renal status and mentation. They recommended MRI lumbar spine which was done 04/02/19 showing severe thecal sac effacement L4/L5 due to listhesis with epidural collection at L5 and subdural collection from L2-L4 with displacement. Head CT repeated and was negative for bleed. Medications adjusted to help with pain management but she continued to have pain and anxiety limiting overall mobility. After discussion with husband, she was taken by to OR for re-exploration of L4/L5 with removal of residual spinous process and redo foraminotomies with screw fixation L3-S1 on 04/08/19 by Dr. Saintclair Halsted. Post op, she has had great improvement in pain and mild left foot drop reported to be stable. MD recommended CIR due to functional decline. Patient transferred to CIR on 04/14/2019 .   Patient currently requires mod with mobility secondary to muscle weakness, decreased coordination, decreased safety awareness and decreased standing balance, decreased postural control and decreased balance strategies.  Prior to hospitalization, patient was modified independent  with mobility and lived with Spouse in a House home.  Home access is 3Stairs to enter.  Patient will benefit from skilled PT intervention to maximize safe functional mobility, minimize fall risk and decrease caregiver burden for planned discharge home with 24 hour supervision.  Anticipate patient will benefit from  follow up [place] at discharge.  PT - End of Session Activity Tolerance: Tolerates 30+ min activity with multiple rests Endurance Deficit: Yes PT Assessment Rehab Potential (ACUTE/IP ONLY): Good PT Barriers to Discharge:  Behavior;Inaccessible home environment PT Barriers to Discharge Comments: anxiety with mobility, STE and 2 level home PT Patient demonstrates impairments in the following area(s): Balance;Behavior;Edema;Endurance;Motor;Pain;Perception;Safety;Sensory PT Transfers Functional Problem(s): Bed Mobility;Bed to Chair;Car;Furniture;Floor PT Locomotion Functional Problem(s): Ambulation;Wheelchair Mobility;Stairs PT Plan PT Intensity: Minimum of 1-2 x/day ,45 to 90 minutes PT Frequency: 5 out of 7 days PT Duration Estimated Length of Stay: 12-14 days PT Treatment/Interventions: Ambulation/gait training;DME/adaptive equipment instruction;UE/LE Strength taining/ROM;Psychosocial support;Balance/vestibular training;Functional electrical stimulation;Skin care/wound management;UE/LE Coordination activities;Cognitive remediation/compensation;Functional mobility training;Visual/perceptual remediation/compensation;Community reintegration;Neuromuscular re-education;Wheelchair propulsion/positioning;Stair training;Therapeutic Activities;Discharge planning;Pain management;Disease management/prevention;Patient/family education;Therapeutic Exercise PT Transfers Anticipated Outcome(s): supervision PT Locomotion Anticipated Outcome(s): supervision PT Recommendation Recommendations for Other Services: Neuropsych consult Follow Up Recommendations: Home health PT Patient destination: Home Equipment Recommended: To be determined  Skilled Therapeutic Intervention Evaluation completed (see details above and below) with education on PT POC and goals and individual treatment initiated with focus on functional mobility, review of back precautions, transfers and pain management. Pt supine in bed upon PT arrival, agreeable to therapy tx and reports pain 7/10 in low back, pain medicine given by RN this session for pain management. Therapist educated pt to call for pain medicine prior to therapy so that she can tolerate therapies. Pt  transferred to sitting EOB with mod assist and increased time secondary to pain, cues for techniques and for back precautions. Pt remained sitting EOB x 5 minutes to try to allow pain to subside before moving, supervision for static sitting balance. Pt performed stand pivot to the w/c with mod assist, cues for techniques. Pt transported to the gym. Vitals checked in sitting with HR 97 bpm and  BP 124/66. Pt performed sit<>stand with RW and mod assist, upon standing pt with posterior lean and reports "don't let me fall, don't let me fall." Pt then sits back in to w/c and not able to remain standing long enough to check BP. Pt propelled w/c x 50 ft using B UEs with min assist for steering, cues for longer UE stride. Pt transported to the ortho gym and performed car transfer this session from w/c<>car stand pivot with max assist. Pt transported back to room. Pt ambulated x 10 ft to the bed with RW and mod assist, pt limited by pain, narrow BOS and cues for RW management. Pt transferred from sit>supine with mod assist for LE management, doffed back brace and left supine in bed with needs in reach and bed alarm set.     PT Evaluation Precautions/Restrictions Precautions Precautions: Fall;Back Precaution Booklet Issued: No Precaution Comments: reviewed back precautions Required Braces or Orthoses: Spinal Brace Spinal Brace: Applied in sitting position;Lumbar corset Restrictions Weight Bearing Restrictions: No General   Vital Signs  Pain Pain Assessment Pain Scale: 0-10 Pain Score: 8  Pain Type: Surgical pain;Acute pain Pain Location: Back Pain Orientation: Mid;Lower Pain Descriptors / Indicators: Aching Pain Frequency: Constant Pain Onset: On-going Patients Stated Pain Goal: 4 Pain Intervention(s): Medication (See eMAR)(norco) Home Living/Prior Functioning Home Living Available Help at Discharge: Family;Available 24 hours/day Type of Home: House Home Access: Stairs to enter [employer] of Steps: 3 Entrance Stairs-Rails: Can reach both Home Layout: Two level;Full bath on main level;Able to live on main level with bedroom/bathroom Alternate Level Stairs-Number of Steps: full flight Alternate Level Stairs-Rails: Can reach both  Lives With: Spouse  Prior Function Level of Independence: Requires assistive device for independence;Independent with basic ADLs(per patient report)  Able to Take Stairs?: Yes Driving: No Comments: Used RW and w/c in the house per pt report Cognition Overall Cognitive Status: Impaired/Different from baseline Orientation Level: Oriented X4 Attention: Focused;Sustained Focused Attention: Appears intact Sustained Attention: Appears intact Memory: Impaired Memory Impairment: Decreased recall of new information Awareness: Impaired Sensation Sensation Light Touch: Appears Intact Proprioception: Appears Intact Coordination Gross Motor Movements are Fluid and Coordinated: No Fine Motor Movements are Fluid and Coordinated: No Coordination and Movement Description: limited secondary to pain/weakness Motor  Motor Motor - Skilled Clinical Observations: generalized weakness overall, L anterior tibialis weakness>R with mild foot drop  Mobility Bed Mobility Bed Mobility: Rolling Left;Supine to Sit;Sit to Supine Rolling Left: Moderate Assistance - Patient 50-74% Supine to Sit: Moderate Assistance - Patient 50-74% Sit to Supine: Moderate Assistance - Patient 50-74% Transfers Transfers: Sit to Stand;Stand Pivot Transfers Sit to Stand: Moderate Assistance - Patient 50-74% Stand Pivot Transfers: Moderate Assistance - Patient 50 - 74% Stand Pivot Transfer Details: Verbal cues for safe use of DME/AE;Verbal cues for precautions/safety;Verbal cues for technique;Manual facilitation for placement Transfer (Assistive device): None Locomotion  Gait Ambulation: Yes Gait Assistance: Moderate Assistance - Patient 50-74% Gait Distance (Feet): 10  Feet Assistive device: Rolling walker Gait Assistance Details: Verbal cues for gait pattern;Verbal cues for safe use of DME/AE;Verbal cues for precautions/safety Gait Gait: Yes Gait Pattern: Step-to pattern;Narrow base of support Gait velocity: Decreased  Stairs / Additional Locomotion Stairs: No Wheelchair Mobility Wheelchair Mobility: Yes Wheelchair Assistance: Minimal assistance - Patient >75% Wheelchair Propulsion: Both upper extremities Wheelchair Parts Management: Needs assistance Distance: 50 ft  Trunk/Postural Assessment  Cervical Assessment Cervical Assessment: Exceptions to WFL(fprward head posture) Thoracic Assessment Thoracic Assessment: Exceptions to WFL(rounded shoulders) Lumbar Assessment Lumbar Assessment: Exceptions to WFL(lumbar corset donned in sitting secondary to post op Lx foramenectomy) Postural Control Postural Control: Deficits on evaluation(posterior lean)  Balance Balance Balance Assessed: Yes Static Sitting Balance Static Sitting - Level of Assistance: 5: Stand by assistance Dynamic Sitting Balance Dynamic Sitting - Level of Assistance: 5: Stand by assistance;4: Min assist Static Standing Balance Static Standing - Level of Assistance: 3: Mod assist(posterior lean) Dynamic Standing Balance Dynamic Standing - Level of Assistance: 3: Mod assist Extremity Assessment  RLE Assessment RLE Assessment: Exceptions to [facility] Passive Range of Motion (PROM) Comments: DF to neutral, limited hip internal rotation General Strength Comments: impaired, see detailed testing below RLE Strength Right Hip Flexion: 3-/5 Right Hip Extension: 3+/5 Right Knee Flexion: 3+/5 Right Knee Extension: 3+/5 Right Ankle Dorsiflexion: 4/5 Right Ankle Plantar Flexion: 4/5 LLE Assessment LLE Assessment: Exceptions to [facility] Passive Range of Motion (PROM) Comments: limited DF to neutral Active Range of Motion (AROM) Comments: limited DF AROM secondary to weakness General Strength  Comments: impaired, see detailed testing below LLE Strength Left Hip Flexion: 3+/5 Left Hip Extension: 3+/5 Left Knee Flexion: 3+/5 Left Knee Extension: 4/5 Left Ankle Dorsiflexion: 2+/5 Left Ankle Plantar Flexion: 3+/5    Refer to Care Plan for Long Term Goals  Recommendations for other services: Neuropsych  Discharge Criteria: Patient will be discharged from PT if patient refuses treatment 3 consecutive times without medical reason, if treatment goals not met, if there is a change in medical status, if patient makes no progress towards goals or if patient is discharged from hospital.  The above assessment, treatment plan, treatment alternatives and goals were discussed and mutually agreed upon: by patient  Netta Corrigan,  PT, DPT 04/15/2019, 10:05 AM

## 2019-04-15 NOTE — Progress Notes (Signed)
Inpatient Rehabilitation  Patient information reviewed and entered into eRehab system by Dresean Beckel M. Keyosha Tiedt, M.A., CCC/SLP, PPS Coordinator.  Information including medical coding, functional ability and quality indicators will be reviewed and updated through discharge.    

## 2019-04-15 NOTE — Evaluation (Signed)
Occupational Therapy Assessment and Plan  Patient Details  Name: Tiffany Gill MRN: [id number] Date of Birth: Feb 10, 1941  OT Diagnosis: acute pain and muscle weakness (generalized) Rehab Potential: Rehab Potential (ACUTE ONLY): Good ELOS: ~10-14 days   Today's Date: 04/15/2019 OT Individual Time: 1300-1400 OT Individual Time Calculation (min): 60 min     Problem List:  Patient Active Problem List   Diagnosis Date Noted  . Spondylolisthesis of lumbar region 04/14/2019  . Spondylisthesis 04/08/2019  . Back pain   . Anxiety and depression   . Chronic pain syndrome   . Acute blood loss anemia   . Stage 3 chronic kidney disease ([place])   . Spondylolisthesis at L4-L5 level 03/17/2019    Past Medical History:  Past Medical History:  Diagnosis Date  . Anxiety   . Gait disorder   . High cholesterol   . Hypertension   . Hypothyroidism   . Major depression in partial remission ([place])   . Memory loss   . Osteoarthritis    Past Surgical History:  Past Surgical History:  Procedure Laterality Date  . DECOMPRESSIVE LUMBAR LAMINECTOMY LEVEL 4  02/2019  . PARATHYROIDECTOMY      Assessment & Plan Clinical Impression: Patient is a 78 y.o. year old female w history of HTN, MDD, memory loss, LBP radiating to LLE>RLE due to spondylolisthesis with facet arthropathy and severe spinal stenosis affecting L4 and L5 nerve roots s/p 20 for L4-L5 decompressive laminectomy with fusion 03/17/19 who started developing progressive LBP and hip pain with follow up X rays in office revealing subsistence of spacer. She was admitted on 03/24/19 for work up and pain management. CT spine done revealing subsidence of right spacer into superior endplate of L5 but overall stable hardware. 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 She did sustained a fall striking her head on 07/04 with subsequent occipital  laceration. 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 CT left hip was negative for fracture.   She continued to have issues with delirium and buttock pain therefore hospitalist were consulted for input she was treated with fluid bolus with improvement in renal status and mentation. They recommended MRI lumbar spine which was done 04/02/19 showing severe thecal sac effacement L4/L5 due to listhesis with epidural collection at L5 and subdural collection from L2-L4 with displacement. Head CT repeated and was negative for bleed. Medications adjusted to help with pain management but she continued to have pain and anxiety limiting overall mobility. After discussion with husband, she was taken by to OR for re-exploration of L4/L5 with removal of residual spinous process and redo foraminotomies with screw fixation L3-S1 on 04/08/19 by Dr. Saintclair Halsted. Post op, she has had great improvement in pain and mild left foot drop reported to be stable history of HTN, MDD, memory loss, LBP radiating to LLE>RLE due to spondylolisthesis with facet arthropathy and severe spinal stenosis affecting L4 and L5 nerve roots s/p 20 for L4-L5 decompressive laminectomy with fusion 03/17/19 who started developing progressive LBP and hip pain with follow up X rays in office revealing subsistence of spacer. She was admitted on 03/24/19 for work up and pain management. CT spine done revealing subsidence of right spacer into superior endplate of L5 but overall stable hardware. She was started on decadron for pain management but developed confusion with hallucinations felt to be due  to steroid psychosis therefore decadron d/c. She did sustained a fall striking her head on 07/04 with subsequent occipital laceration. 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 CT left hip was negative for  fracture.   She continued to have issues with delirium and buttock pain therefore hospitalist were consulted for input she was treated with fluid bolus with improvement in renal status and mentation. They recommended MRI lumbar spine which was done 04/02/19 showing severe thecal sac effacement L4/L5 due to listhesis with epidural collection at L5 and subdural collection from L2-L4 with displacement. Head CT repeated and was negative for bleed. Medications adjusted to help with pain management but she continued to have pain and anxiety limiting overall mobility. After discussion with husband, she was taken by to OR for re-exploration of L4/L5 with removal of residual spinous process and redo foraminotomies with screw fixation L3-S1 on 04/08/19 by Dr. Saintclair Halsted. Post op, she has had great improvement in pain and mild left foot drop reported to be stable.Patient transferred to CIR on 04/14/2019 .    Patient currently requires mod with basic self-care skills and min to mod A with mobility  secondary to muscle weakness and acute pain , decreased cardiorespiratoy endurance and decreased sitting balance, decreased standing balance and difficulty maintaining precautions.  Prior to hospitalization, patient could complete ADL with mod I to supervision.  Patient will benefit from skilled intervention to decrease level of assist with basic self-care skills and increase independence with basic self-care skills prior to discharge home with care partner.  Anticipate patient will require 24 hour supervision and intermittent supervision and follow up home health.  OT - End of Session Activity Tolerance: Tolerates 30+ min activity with multiple rests Endurance Deficit: Yes OT Assessment Rehab Potential (ACUTE ONLY): Good OT Patient demonstrates impairments in the following area(s): Balance;Cognition;Endurance;Motor;Pain;Skin Integrity;Safety OT Basic ADL's Functional Problem(s): Grooming;Bathing;Dressing;Toileting OT  Transfers Functional Problem(s): Toilet;Tub/Shower OT Additional Impairment(s): None OT Plan OT Intensity: Minimum of 1-2 x/day, 45 to 90 minutes OT Frequency: 5 out of 7 days OT Duration/Estimated Length of Stay: ~10-14 days OT Treatment/Interventions: Balance/vestibular training;Discharge planning;Pain management;Self Care/advanced ADL retraining;Therapeutic Activities;UE/LE Coordination activities;Cognitive remediation/compensation;Community reintegration;DME/adaptive equipment instruction;Neuromuscular re-education;Psychosocial support;UE/LE Strength taining/ROM;Wheelchair propulsion/positioning;Visual/perceptual remediation/compensation;Therapeutic Exercise;Skin care/wound managment;Patient/family education;Functional mobility training;Splinting/orthotics OT Self Feeding Anticipated Outcome(s): n/a OT Basic Self-Care Anticipated Outcome(s): supervision OT Toileting Anticipated Outcome(s): supervision OT Bathroom Transfers Anticipated Outcome(s): supervision OT Recommendation Recommendations for Other Services: Neuropsych consult Patient destination: Home Follow Up Recommendations: Home health OT Equipment Recommended: To be determined   Skilled Therapeutic Intervention 1:1 Ot eval initiated with Ot goals, purpose and role discussed. Self care retraining at sink level with focus on maintaining back precautions with functional mobility, sit to stands, stand pivot transfers, and activity tolerance. Pt able to come to eOB with min to mod A with cues to precautions. Pt appears anxious with all mobility in this session but with extra time and encouragement pt able to transfer with min A as well as sit to stands during ADL. Pt's husband present and supportive. Discussed being out of bed during the day and encourage sitting up out of bed for all meals.   OT Evaluation Precautions/Restrictions  Precautions Precautions: Back;Fall Precaution Comments: reviewed back precautions Required Braces or  Orthoses: Spinal Brace Spinal Brace: Applied in sitting position;Lumbar corset Restrictions Weight Bearing Restrictions: No General Chart Reviewed: Yes Family/Caregiver Present: Yes(husband present) Vital Signs Therapy Vitals Temp: 97.7 F (36.5 C) Resp: 18  Patient Position (if appropriate): Sitting Oxygen Therapy SpO2: 96 % O2 Device: Room Air Pain Pain Assessment Pain Scale: 0-10 Pain Score: 7  Pain Type: Acute pain;Surgical pain Pain Location: Back Pain Orientation: Mid;Lower Pain Descriptors / Indicators: Aching Pain Frequency: Constant Pain Onset: On-going Patients Stated Pain Goal: 4 Pain Intervention(s): Medication (See eMAR)(oxycodone) Home Living/Prior Functioning Home Living Family/patient expects to be discharged to:: Private residence Living Arrangements: Spouse/significant other Available Help at Discharge: Family, Available 24 hours/day Type of Home: House Home Access: Stairs to enter Technical brewer of Steps: 3 Entrance Stairs-Rails: Can reach both Home Layout: Two level, Full bath on main level, Able to live on main level with bedroom/bathroom Alternate Level Stairs-Number of Steps: full flight Alternate Level Stairs-Rails: Can reach both  Lives With: Spouse Prior Function Level of Independence: Requires assistive device for independence, Independent with basic ADLs(per patient report)  Able to Take Stairs?: Yes Driving: No Comments: Used RW and w/c in the house per pt report ADL ADL Eating: Set up Grooming: Setup Where Assessed-Grooming: Sitting at sink Upper Body Bathing: Supervision/safety Where Assessed-Upper Body Bathing: Sitting at sink, Wheelchair Lower Body Bathing: Maximal assistance Where Assessed-Lower Body Bathing: Sitting at sink, Wheelchair Upper Body Dressing: Maximal assistance Where Assessed-Upper Body Dressing: Wheelchair Lower Body Dressing: Maximal assistance Where Assessed-Lower Body Dressing: Sitting at sink,  Medical laboratory scientific officer Method: Not assessed Tub/Shower Transfer: Not assessed Social research officer, government: Not assessed Vision Baseline Vision/History: No visual deficits Patient Visual Report: No change from baseline Vision Assessment?: No apparent visual deficits Perception  Perception: Within Functional Limits Praxis Praxis: Intact Cognition Overall Cognitive Status: Within Functional Limits for tasks assessed Arousal/Alertness: Awake/alert Orientation Level: Person;Place;Situation Person: Oriented Place: Oriented Situation: Oriented Year: 2020 Month: July Day of Week: Correct Memory: Impaired Memory Impairment: Decreased short term memory;Decreased recall of new information Decreased Short Term Memory: Verbal basic;Functional basic Immediate Memory Recall: Sock;Blue;Bed Memory Recall Sock: Without Cue Memory Recall Blue: Without Cue Memory Recall Bed: Without Cue Focused Attention: Appears intact Sustained Attention: Appears intact Awareness: Appears intact Awareness Impairment: Emergent impairment Problem Solving: Impaired Problem Solving Impairment: Functional basic Safety/Judgment: Impaired Sensation Sensation Light Touch: Appears Intact Proprioception: Appears Intact Stereognosis: Appears Intact Coordination Gross Motor Movements are Fluid and Coordinated: No Fine Motor Movements are Fluid and Coordinated: Yes Motor  Motor Motor - Skilled Clinical Observations: generalized weakness overall, L anterior tibialis weakness>R with mild foot drop Mobility  Bed Mobility Supine to Sit: Moderate Assistance - Patient 50-74% Transfers Sit to Stand: Moderate Assistance - Patient 50-74%  Trunk/Postural Assessment  Cervical Assessment Cervical Assessment: (forward posture) Thoracic Assessment Thoracic Assessment: (rounded shoulder) Lumbar Assessment Lumbar Assessment: (posterior pelvic tilt)  Balance Static Sitting Balance Static Sitting - Level of Assistance:  5: Stand by assistance Dynamic Sitting Balance Dynamic Sitting - Level of Assistance: 5: Stand by assistance;4: Min assist Static Standing Balance Static Standing - Level of Assistance: 3: Mod assist Dynamic Standing Balance Dynamic Standing - Level of Assistance: 3: Mod assist Extremity/Trunk Assessment RUE Assessment RUE Assessment: Within Functional Limits LUE Assessment LUE Assessment: Within Functional Limits     Refer to Care Plan for Long Term Goals  Recommendations for other services: Neuropsych   Discharge Criteria: Patient will be discharged from OT if patient refuses treatment 3 consecutive times without medical reason, if treatment goals not met, if there is a change in medical status, if patient makes no progress towards goals or if patient is discharged from hospital.  The above assessment, treatment plan, treatment alternatives  and goals were discussed and mutually agreed upon: by patient  Nicoletta Ba 04/15/2019, 6:52 PM

## 2019-04-15 NOTE — Progress Notes (Signed)
Social Work Assessment and Plan   Patient Details  Name: Tiffany Gill MRN: [id number] Date of Birth: 1940/11/03  Today's Date: 04/15/2019  Problem List:  Patient Active Problem List   Diagnosis Date Noted  . Spondylolisthesis of lumbar region 04/14/2019  . Spondylisthesis 04/08/2019  . Back pain   . Anxiety and depression   . Chronic pain syndrome   . Acute blood loss anemia   . Stage 3 chronic kidney disease ([place])   . Spondylolisthesis at L4-L5 level 03/17/2019   Past Medical History:  Past Medical History:  Diagnosis Date  . Anxiety   . Gait disorder   . High cholesterol   . Hypertension   . Hypothyroidism   . Major depression in partial remission ([place])   . Memory loss   . Osteoarthritis    Past Surgical History:  Past Surgical History:  Procedure Laterality Date  . DECOMPRESSIVE LUMBAR LAMINECTOMY LEVEL 4  02/2019  . PARATHYROIDECTOMY     Social History:  reports that she has never smoked. She has never used smokeless tobacco. She reports current alcohol use of about 3.0 standard drinks of alcohol per week. She reports that she does not use drugs.  Family / Support Systems Marital Status: Married How Long?: 51 years Patient Roles: Spouse, Parent, Other (Comment)(grandmother; great grandmother; church member) Spouse/Significant Other: Hibah Odonnell - husband - [phone] (h) [phone] (m) only texts Children: 2 children Other Supports: friends; church family Anticipated Caregiver: spouse and a friend coming from [place] to help once she is discharged; church family may assist, as well Ability/Limitations of Caregiver: spouse with no limitations Caregiver Availability: 24/7 Family Dynamics: close, supportive husband  Social History Preferred language: English Religion: Episcopalian Education: college Read: Yes Write: Yes Employment Status: Retired Public relations account executive Issues: none reported Guardian/Conservator: MD has determined that  pt is not fully capable of making her own decisions.  Husband is next of kin for decision making.   Abuse/Neglect Abuse/Neglect Assessment Can Be Completed: Yes Physical Abuse: Denies Verbal Abuse: Denies Sexual Abuse: Denies Exploitation of patient/patient's resources: Denies Self-Neglect: Denies  Emotional Status Pt's affect, behavior and adjustment status: Pt was in pretty good spirits during CSW visit, but she admits to being a bit more cranky during therapy sessions.  This is due to pain and having to do things she doesn't feel is ready for.  She knows why she is on Rehab and knows it is what is best for her right now. Recent Psychosocial Issues: Pt with repeat back surgery with multiple hospitalizations. Psychiatric History: anxiety and depression hx Substance Abuse History: none reported  Patient / Family Perceptions, Expectations & Goals Pt/Family understanding of illness & functional limitations: Pt/husband have a good understanding of pt's condition and limitations and that it will take time to heal. Premorbid pt/family roles/activities: Pt was not doing much recently with COVID and first back surgery.  Likes to go to her church and to spend time with family. Anticipated changes in roles/activities/participation: Pt would like to resume these when she is able. Pt/family expectations/goals: Pt's husband wants pt to be able to get out of bed and stand/walk.  Community [employer] Agencies: None Premorbid Home Care/DME Agencies: Other (Comment)([facility] - but they had not been able to start since pt had to be readmitted and have surgery redone. Pt has a rollator.) Transportation available at discharge: husband Resource referrals recommended: Neuropsychology  Discharge Planning Living Arrangements: Spouse/significant other Support Systems: Spouse/significant other, Children, Other relatives, Friends/neighbors, Social worker community,  Home care staff Type of Residence:  Private residence Insurance Resources: Medicare, Multimedia programmer (specify)([employer] supplement) Financial Resources: Radio broadcast assistant Screen Referred: No Money Management: Spouse Does the patient have any problems obtaining your medications?: No Home Management: Pt and spouse share this, but most recently husband has been doing all home management. Patient/Family Preliminary Plans: Pt/husband plan for pt to return home and he or their friend will be with pt 24/7. Social Work Anticipated Follow Up Needs: HH/OP Expected length of stay: 12 to 14 days  Clinical Impression CSW met with pt and her husband to introduce self and role of CSW, as well as to complete assessment.  Pt was pleasant with CSW and expressed understanding of why she is on CIR.  Pt's husband is pleased she could come to CIR and is glad her pain and confusion are better. Husband is very supportive and plans to be here often to encourage pt.  Pt admits to not always being cooperative in therapy as it hurts and sometimes she's scared to do/try the things the therapists want her to.  CSW offered encouragement.  No current needs/concerns/questions.  CSW will continue to follow and assist as needed.  Milynn Quirion, Silvestre Mesi 04/15/2019, 4:30 PM

## 2019-04-15 NOTE — Evaluation (Signed)
Speech Language Pathology Assessment and Plan  Patient Details  Name: Tiffany Gill MRN: [id number] Date of Birth: 1940/10/30  SLP Diagnosis: Cognitive Impairments  Rehab Potential: Excellent ELOS: 12-14 days    Today's Date: 04/15/2019 SLP Individual Time: 9622-2979 SLP Individual Time Calculation (min): 55 min   Problem List:  Patient Active Problem List   Diagnosis Date Noted  . Spondylolisthesis of lumbar region 04/14/2019  . Spondylisthesis 04/08/2019  . Back pain   . Anxiety and depression   . Chronic pain syndrome   . Acute blood loss anemia   . Stage 3 chronic kidney disease ([place])   . Spondylolisthesis at L4-L5 level 03/17/2019   Past Medical History:  Past Medical History:  Diagnosis Date  . Anxiety   . Gait disorder   . High cholesterol   . Hypertension   . Hypothyroidism   . Major depression in partial remission ([place])   . Memory loss   . Osteoarthritis    Past Surgical History:  Past Surgical History:  Procedure Laterality Date  . DECOMPRESSIVE LUMBAR LAMINECTOMY LEVEL 4  02/2019  . PARATHYROIDECTOMY      Assessment / Plan / Recommendation Clinical Impression Patient is a 78 year old female with history of HTN, MDD, memory loss, LBP radiating to LLE>RLE due to spondylolisthesis with facet arthropathy and severe spinal stenosis affecting L4 and L5 nerve roots s/p 20 for L4-L5 decompressive laminectomy with fusion 03/17/19 who started developing progressive LBP and hip pain with follow up X rays in office revealing subsistence of spacer. She was admitted on 03/24/19 for work up and pain management. CT spine done revealing subsidence of right spacer into superior endplate of L5 but overall stable hardware. 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 She did sustained a fall striking her head on 07/04 with subsequent occipital laceration. 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 CT left hip was negative for fracture.  She continued to have issues with delirium and buttock pain therefore hospitalist were consulted for input she was treated with fluid bolus with improvement in renal status and mentation. They recommended MRI lumbar spine which was done 04/02/19 showing severe thecal sac effacement L4/L5 due to listhesis with epidural collection at L5 and subdural collection from L2-L4 with displacement. Head CT repeated and was negative for bleed. Medications adjusted to help with pain management but she continued to have pain and anxiety limiting overall mobility. After discussion with husband, she was taken by to OR for re-exploration of L4/L5 with removal of residual spinous process and redo foraminotomies with screw fixation L3-S1 on 04/08/19 by Dr. Saintclair Gill. Post op, she has had great improvement in pain and mild left foot drop reported to be stable. MD recommended CIR due to functional decline and patient admitted 04/14/19.   Patient administered the Cognistat and demonstrated moderate impairments in word-finding and short-term recall. Moderate impairments were also noted in emergent awareness and functional problem solving which impacts her safety with functional and familiar tasks. However, patient's husband present and reported mild baseline memory deficits and anxiety which can impact function at times and also feels pain medications is also impacting her overall functional independence. Patient would benefit from skilled SLP intervention for continued diagnostic treatment of cognitive-linguistic functioning in order to maximize her overall functional independence prior to discharge.    Skilled Therapeutic  Interventions          Administered a cognitive-linguistic evaluation, please see above for details. Educated patient and her husband in regards to current cognitive  impairments and goals of skilled SLP intervention, both verbalized understanding and agreement.   SLP Assessment  Patient will need skilled [place] Pathology Services during CIR admission    Recommendations  Recommendations for Other Services: Neuropsych consult Patient destination: Home Follow up Recommendations: Home Health SLP;24 hour supervision/assistance Equipment Recommended: None recommended by SLP    SLP Frequency 1 to 3 out of 7 days   SLP Duration  SLP Intensity  SLP Treatment/Interventions 12-14 days  Minumum of 1-2 x/day, 30 to 90 minutes  Cognitive remediation/compensation;Cueing hierarchy;Environmental controls;Functional tasks;Patient/family education;Internal/external aids    Pain Pain Assessment Pain Scale: 0-10 Pain Score: 7  Pain Type: Acute pain;Surgical pain Pain Location: Back Pain Orientation: Mid;Lower Pain Descriptors / Indicators: Aching Pain Frequency: Constant Pain Onset: On-going Patients Stated Pain Goal: 4 Pain Intervention(s): Medication (See eMAR)(oxycodone given)   Short Term Goals: Week 1: SLP Short Term Goal 1 (Week 1): Patient will demonstrate recall of functional information with Min A verbal cues for use of external aids. SLP Short Term Goal 2 (Week 1): Patient will self-monitor and correct errors during functional tasks with Min A verbal cues. SLP Short Term Goal 3 (Week 1): Patient will demonstrate functional problem solving for basic and familiar tasks with Min A verbal cues.  Refer to Care Plan for Long Term Goals  Recommendations for other services: Neuropsych  Discharge Criteria: Patient will be discharged from SLP if patient refuses treatment 3 consecutive times without medical reason, if treatment goals not met, if there is a change in medical status, if patient makes no progress towards goals or if patient is discharged from hospital.  The above assessment, treatment plan, treatment alternatives and goals were  discussed and mutually agreed upon: by patient and by family  Ethen Bannan 04/15/2019, 3:05 PM

## 2019-04-15 NOTE — Plan of Care (Signed)
  Problem: Consults Goal: RH SPINAL CORD INJURY PATIENT EDUCATION Description:  See Patient Education module for education specifics.  Outcome: Progressing   Problem: SCI BOWEL ELIMINATION Goal: RH STG MANAGE BOWEL WITH ASSISTANCE Description: STG Manage Bowel with min. Assistance. Outcome: Progressing Goal: RH STG SCI MANAGE BOWEL WITH MEDICATION WITH ASSISTANCE Description: STG SCI Manage bowel with medication with min assistance. Outcome: Progressing   Problem: SCI BLADDER ELIMINATION Goal: RH STG MANAGE BLADDER WITH ASSISTANCE Description: STG Manage Bladder With min. Assistance Outcome: Progressing   Problem: RH SKIN INTEGRITY Goal: RH STG SKIN FREE OF INFECTION/BREAKDOWN Description: With min. assist Outcome: Progressing Goal: RH STG MAINTAIN SKIN INTEGRITY WITH ASSISTANCE Description: STG Maintain Skin Integrity With min. Assistance. Outcome: Progressing Goal: RH STG ABLE TO PERFORM INCISION/WOUND CARE W/ASSISTANCE Description: STG Able To Perform Incision/Wound Care With min.Assistance. Outcome: Progressing   Problem: RH SAFETY Goal: RH STG ADHERE TO SAFETY PRECAUTIONS W/ASSISTANCE/DEVICE Description: STG Adhere to Safety Precautions With cues and reminder Assistance/Device. Outcome: Progressing   Problem: RH PAIN MANAGEMENT Goal: RH STG PAIN MANAGED AT OR BELOW PT'S PAIN GOAL Description: Less than 3. Outcome: Progressing   Problem: RH KNOWLEDGE DEFICIT SCI Goal: RH STG INCREASE KNOWLEDGE OF SELF CARE AFTER SCI Description: Pt. And family will be able to follow the safety precations to prevent falls at home. Outcome: Progressing

## 2019-04-16 ENCOUNTER — Inpatient Hospital Stay (HOSPITAL_COMMUNITY): Payer: Medicare Other

## 2019-04-16 ENCOUNTER — Inpatient Hospital Stay (HOSPITAL_COMMUNITY): Payer: Medicare Other | Admitting: Occupational Therapy

## 2019-04-16 ENCOUNTER — Encounter (HOSPITAL_COMMUNITY): Payer: Medicare Other | Admitting: Psychology

## 2019-04-16 ENCOUNTER — Inpatient Hospital Stay (HOSPITAL_COMMUNITY): Payer: Medicare Other | Admitting: Speech Pathology

## 2019-04-16 DIAGNOSIS — F331 Major depressive disorder, recurrent, moderate: Secondary | ICD-10-CM

## 2019-04-16 DIAGNOSIS — F411 Generalized anxiety disorder: Secondary | ICD-10-CM

## 2019-04-16 LAB — CBC
HCT: 25.1 % — ABNORMAL LOW (ref 36.0–46.0)
Hemoglobin: 8 g/dL — ABNORMAL LOW (ref 12.0–15.0)
MCH: 32.5 pg (ref 26.0–34.0)
MCHC: 31.9 g/dL (ref 30.0–36.0)
MCV: 102 fL — ABNORMAL HIGH (ref 80.0–100.0)
Platelets: 433 10*3/uL — ABNORMAL HIGH (ref 150–400)
RBC: 2.46 MIL/uL — ABNORMAL LOW (ref 3.87–5.11)
RDW: 15.1 % (ref 11.5–15.5)
WBC: 8.1 10*3/uL (ref 4.0–10.5)
nRBC: 0.4 % — ABNORMAL HIGH (ref 0.0–0.2)

## 2019-04-16 MED ORDER — TRAMADOL-ACETAMINOPHEN 37.5-325 MG PO TABS
1.0000 | ORAL_TABLET | Freq: Four times a day (QID) | ORAL | Status: DC | PRN
  Administered 2019-04-16 – 2019-04-21 (×5): 1 via ORAL
  Filled 2019-04-16 (×5): qty 1

## 2019-04-16 NOTE — Consult Note (Signed)
Neuropsychological Consultation   Patient:   Tiffany Gill   DOB:   09-Apr-1941  MR Number:  [id number]  Location:  [place] [facility] [place] [id number] [place] [address] Dept: [place]: [phone]           Date of Service:   04/16/2019  Start Time:   8 AM End Time:   9 AM  Provider/Observer:  Ilean Skill, Psy.D.       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  Patient underwent decompressive laminectomy with fusion on 03/17/2019.  Patient had worsening of pain and admitted on 6/29 for workup and pain management.  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  Patient had fall on 7/4 stricking head with occipital laceration.  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  Patient continued to have issues with delirium and pain and follow-up MRI on 7/8 showed severe changes in L area and patient returned to OR for re-exploration and removal of residual spinous process and redo screw fixation.  Great improvement in pain post op.    Patient has long history of severe recurrent depression with recurring ECT at [facility].  Her last ECT according to my review of med records was Feb 2020.   Patient reports that she been through many psychotropic trials in past with varying levels of success.    Reason for Service:  Patient was referred for neuropsycholgocal consulation due to coping and adjustment issues and monitor improving Mental Status and cognition.  Below is the HPI for the current admission.  [name] is a 78 year old female with history of HTN, MDD, memory loss, LBP  radiating to LLE>RLE due to spondylolisthesis with facet arthropathy and severe spinal stenosis affecting L4 and L5 nerve roots s/p 20 for L4-L5 decompressive laminectomy with fusion 03/17/19 who started developing progressive LBP and hip pain with follow up X rays in office revealing subsistence of spacer. She was admitted on 03/24/19 for work up and pain management. CT spine done revealing subsidence of right spacer into superior endplate of L5 but overall stable hardware. 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 She did sustained a fall striking her head on 07/04 with subsequent occipital laceration. 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 CT left hip was negative for fracture.   She continued to have issues with delirium and buttock pain therefore hospitalist were consulted for input she was treated with fluid bolus with improvement in renal status and mentation. They recommended MRI lumbar spine which was done 04/02/19 showing severe thecal sac effacement L4/L5 due to listhesis with epidural collection at L5 and subdural collection from L2-L4 with displacement. Head CT repeated and was negative for bleed. Medications adjusted to help with pain management but she continued to have pain and anxiety limiting overall mobility. After discussion with husband, she was taken by to OR for re-exploration of L4/L5 with removal of residual spinous process and redo foraminotomies with screw fixation L3-S1 on 04/08/19  by Dr. Saintclair Halsted. Post op, she has had great improvement in pain and mild left foot drop reported to be stable. MD recommended CIR due to functional decline.   Current Status:  Patient does acknowledge ongoing anxiety and worry but her mental status is improving and she was oriented with adequate comprehension and  understanding of situation.    Behavioral Observation: [name]  presents as a 78 y.o.-year-old Right Caucasian Female who appeared her stated age. her dress was Appropriate and she was Well Groomed and her manners were Appropriate to the situation.  her participation was indicative of Appropriate and Redirectable behaviors.  There were any physical disabilities noted.  she displayed an appropriate level of cooperation and motivation.     Interactions:    Active Appropriate and Redirectable  Attention:   abnormal and attention span appeared shorter than expected for age  Memory:   abnormal; remote memory intact, recent memory impaired  Visuo-spatial:  not examined  Speech (Volume):  normal  Speech:   normal; normal  Thought Process:  Coherent and Tangential  Though Content:  WNL; not suicidal and not homicidal  Orientation:   person, place, time/date and situation  Judgment:   Fair  Planning:   Fair  Affect:    Anxious  Mood:    Anxious  Insight:   Fair  Intelligence:   normal  Medical History:   Past Medical History:  Diagnosis Date  . Anxiety   . Gait disorder   . High cholesterol   . Hypertension   . Hypothyroidism   . Major depression in partial remission ([place])   . Memory loss   . Osteoarthritis         Psychiatric History:  Patient does have past history of depression and anxiety disorder.  She reports that she is dealing with more anxiety now than depressive symptoms.    Family Med/Psych History:  Family History  Problem Relation Age of Onset  . Stroke Mother   . Heart disease Father   . Heart disease Brother    Impression/DX:  Tiffany Gill is a 78 year old female with history of HTN, MDD, memory loss, LBP due to spondylolisthesis with facet arthropathy and severe spinal stenosis affecting L4 and L5 nerve roots.  Patient underwent decompressive laminectomy with fusion on 03/17/2019.  Patient had worsening of pain and admitted on 6/29 for workup  and pain management.  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  Patient had fall on 7/4 stricking head with occipital laceration.  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  Patient continued to have issues with delirium and pain and follow-up MRI on 7/8 showed severe changes in L area and patient returned to OR for re-exploration and removal of residual spinous process and redo screw fixation.  Great improvement in pain post op.    Patient has long history of severe recurrent depression with recurring ECT at [facility].  Patient reports that she been through many psychotropic trials in past with varying levels of success.    Patient does acknowledge ongoing anxiety and worry but her mental status is improving and she was oriented with adequate comprehension and understanding of situation.   Disposition/Plan:  Worked on issues of anxiety, worry and adjustment issues.  Patient does have both psychiatrist and psychologist so did not get too deep into therapeutic efforts.  We will check to see if her therapist can do WebEx with her.  Diagnosis:   Anxiety and long history of severe MDD with recurrent ECT at [facility].          Electronically Signed   _______________________ Ilean Skill, Psy.D.

## 2019-04-16 NOTE — Progress Notes (Signed)
Physical Therapy Session Note  Patient Details  Name: Tiffany Gill MRN: [id number] Date of Birth: [birth year]  Today's Date: 04/16/2019 PT Individual Time: 1415-1545 PT Individual Time Calculation (min): 90 min   Short Term Goals: Week 1:  PT Short Term Goal 1 (Week 1): Pt will perform bed mobility with min assist PT Short Term Goal 2 (Week 1): Pt will perform bed<>chair transfer with  min assist PT Short Term Goal 3 (Week 1): Pt will ambulated x 50 ft with RW and min assist  Skilled Therapeutic Interventions/Progress Updates:     Patient in bed with her husband at bedside upon PT arrival. Patient alert and agreeable to PT session.  Therapeutic Activity: Bed Mobility: Patient performed supine to/from sit with min-mod A in a flat bed with use of bed rails. Provided verbal cues for log roll technique to maintain spinal precautions, patient able to recall 3/3 precautions. Transfers: Patient performed stand pivot transfers to/from the w/c and to/from the toilet using the RW with mod A and max cueing for use of RW. Required total A for LB dressing and peri-care during toileting. She performed squat pivot x2 with min A after PT demonstrated technique, provided cues for sequencing and head-hips relationship. She also performed sit to/from stand x2 with min A using the RW. Provided verbal cues and demonstration for pushing up from a stable surface, but patient unable to stand with this technique and required both hands on RW to stand.    Gait Training:  Patient performed pre-gait standing balance with weight shifts with min A for physical support and facilitation of weight shifts using the RW. Progressed to small marching in place with cuing. Required a lot of encouragement due to decreased confidence and fear of falling.  Patient ambulated 2 steps forward and backwards and 10 feet using RW with min-mod A for physical support. Ambulated with very narrow BOS, very decreased step length and foot  clearance R>L, and posterior lean. Provided demonstration and verbal cues for shifting weight forward and using UE for balance and physical support and for increased BOS and step length/height.  Wheelchair Mobility:  Patient propelled wheelchair 30 feet with min A for initiation. Provided verbal cues for propulsion technique.   Patient in bed at end of session with breaks locked, bed alarm set, and all needs within reach. Educated patient and spouse about ELOS and POC updates from team conference today, both stated understanding and are excited about patient's current progress today.   Therapy Documentation Precautions:  Precautions Precautions: Back, Fall Precaution Booklet Issued: No Precaution Comments: reviewed back precautions Required Braces or Orthoses: Spinal Brace Spinal Brace: Applied in sitting position, Lumbar corset Restrictions Weight Bearing Restrictions: No Pain: Pain Assessment Pain Scale: 0-10 Pain Score: 5-6  Pain Type: Acute pain;Surgical pain Pain Location: Back Pain Orientation: Lower Pain Descriptors / Indicators: Aching Pain Frequency: Constant Pain Onset: On-going Pain Intervention(s): RN made aware;Repositioned;Distraction   Therapy/Group: Individual Therapy  Jalaila Caradonna L Kemoni Quesenberry PT, DPT  04/16/2019, 4:13 PM

## 2019-04-16 NOTE — Progress Notes (Signed)
Speech Language Pathology Daily Session Note  Patient Details  Name: Tiffany Gill MRN: [id number] Date of Birth: Nov 22, 1940  Today's Date: 04/16/2019 SLP Individual Time: 1497-0263 SLP Individual Time Calculation (min): 25 min  Short Term Goals: Week 1: SLP Short Term Goal 1 (Week 1): Patient will demonstrate recall of functional information with Min A verbal cues for use of external aids. SLP Short Term Goal 2 (Week 1): Patient will self-monitor and correct errors during functional tasks with Min A verbal cues. SLP Short Term Goal 3 (Week 1): Patient will demonstrate functional problem solving for basic and familiar tasks with Min A verbal cues.  Skilled Therapeutic Interventions: Skilled treatment session focused on cognitive goals. SLP facilitated session by providing Mod A verbal cues for organization and recall and Min A verbal cues for problem solving with a basic money management task. Patient aware of difficulty of task and verbalized "shock" that she required assistance with task. Patient left upright in wheelchair with all needs within reach. Continue with current plan of care.      Pain Pain Assessment Pain Scale: 0-10 Pain Score: 7  Pain Type: Acute pain;Surgical pain Pain Location: Back Pain Orientation: Lower Pain Descriptors / Indicators: Aching Pain Frequency: Constant Pain Onset: On-going Patients Stated Pain Goal: 4 Pain Intervention(s): Medication (See eMAR)(ultracet)  Therapy/Group: Individual Therapy  [name] 04/16/2019, 3:03 PM

## 2019-04-16 NOTE — Plan of Care (Signed)
  Problem: Consults Goal: RH SPINAL CORD INJURY PATIENT EDUCATION Description:  See Patient Education module for education specifics.  Outcome: Progressing   Problem: SCI BOWEL ELIMINATION Goal: RH STG MANAGE BOWEL WITH ASSISTANCE Description: STG Manage Bowel with min. Assistance. Outcome: Progressing Goal: RH STG SCI MANAGE BOWEL WITH MEDICATION WITH ASSISTANCE Description: STG SCI Manage bowel with medication with min assistance. Outcome: Progressing   Problem: SCI BLADDER ELIMINATION Goal: RH STG MANAGE BLADDER WITH ASSISTANCE Description: STG Manage Bladder With min. Assistance Outcome: Progressing   Problem: RH SKIN INTEGRITY Goal: RH STG SKIN FREE OF INFECTION/BREAKDOWN Description: With min. assist Outcome: Progressing Goal: RH STG MAINTAIN SKIN INTEGRITY WITH ASSISTANCE Description: STG Maintain Skin Integrity With min. Assistance. Outcome: Progressing Goal: RH STG ABLE TO PERFORM INCISION/WOUND CARE W/ASSISTANCE Description: STG Able To Perform Incision/Wound Care With min.Assistance. Outcome: Progressing   Problem: RH SAFETY Goal: RH STG ADHERE TO SAFETY PRECAUTIONS W/ASSISTANCE/DEVICE Description: STG Adhere to Safety Precautions With cues and reminder Assistance/Device. Outcome: Progressing   Problem: RH PAIN MANAGEMENT Goal: RH STG PAIN MANAGED AT OR BELOW PT'S PAIN GOAL Description: Less than 3. Outcome: Progressing   Problem: RH KNOWLEDGE DEFICIT SCI Goal: RH STG INCREASE KNOWLEDGE OF SELF CARE AFTER SCI Description: Pt. And family will be able to follow the safety precations to prevent falls at home. Outcome: Progressing

## 2019-04-16 NOTE — Progress Notes (Signed)
Tiffany Gill PHYSICAL MEDICINE & REHABILITATION PROGRESS NOTE   Subjective/Complaints:  No issues overnite , appears brighter today.  Discussed pt with Neuropsych, some lability noted.  THe pt states she voluntarily stopped ECT since Dec to "see how she would do"  ROS- neg CP SOB, N/V/D  Objective:   No results found. Recent Labs    04/15/19 0854 04/16/19 0520  WBC 8.3 8.1  HGB 8.0* 8.0*  HCT 24.8* 25.1*  PLT 415* 433*   Recent Labs    04/15/19 0854  NA 137  K 4.0  CL 105  CO2 21*  GLUCOSE 83  BUN 13  CREATININE 0.97  CALCIUM 9.4    Intake/Output Summary (Last 24 hours) at 04/16/2019 0907 Last data filed at 04/16/2019 0811 Gross per 24 hour  Intake 640 ml  Output -  Net 640 ml     Physical Exam: Vital Signs Blood pressure 119/63, pulse 90, temperature 97.6 F (36.4 C), resp. rate 18, height 5' (1.524 m), weight 58.5 kg, SpO2 95 %.   General: No acute distress Mood and affect are appropriate Heart: Regular rate and rhythm no rubs murmurs or extra sounds Lungs: Clear to auscultation, breathing unlabored, no rales or wheezes Abdomen: Positive bowel sounds, soft nontender to palpation, nondistended Extremities: No clubbing, cyanosis, or edema  Neurologic: Cranial nerves II through XII intact, motor strength is 4/5 in bilateral deltoid, bicep, tricep, grip,Right  hip flexor, knee extensors, ankle dorsiflexor and plantar flexor 3- Left HF, 4- KE , 3- [place] DF Sensory exam normal sensation to light touch and proprioception in bilateral upper and lower extremities Cerebellar exam normal finger to nose to finger as well as heel to shin in bilateral upper and lower extremities Musculoskeletal: Full range of motion in all 4 extremities. No joint swelling   Assessment/Plan: 1. Functional deficits secondary to Lumbar radiculopathy with LLE weakness which require 3+ hours per day of interdisciplinary therapy in a comprehensive inpatient rehab setting.  Physiatrist is  providing close team supervision and 24 hour management of active medical problems listed below.  Physiatrist and rehab team continue to assess barriers to discharge/monitor patient progress toward functional and medical goals  Care Tool:  Bathing    Body parts bathed by patient: Right arm, Left arm, Chest, Abdomen, Right upper leg, Left upper leg, Face   Body parts bathed by helper: Front perineal area, Buttocks, Right lower leg, Left lower leg     Bathing assist Assist Level: Minimal Assistance - Patient > 75%     Upper Body Dressing/Undressing Upper body dressing   What is the patient wearing?: Hospital gown only    Upper body assist Assist Level: Minimal Assistance - Patient > 75%    Lower Body Dressing/Undressing Lower body dressing      What is the patient wearing?: Incontinence brief, Pants     Lower body assist Assist for lower body dressing: Maximal Assistance - Patient 25 - 49%     Toileting Toileting    Toileting assist Assist for toileting: Moderate Assistance - Patient 50 - 74%     Transfers Chair/bed transfer  Transfers assist     Chair/bed transfer assist level: Minimal Assistance - Patient > 75%     Locomotion Ambulation   Ambulation assist      Assist level: Moderate Assistance - Patient 50 - 74% Assistive device: Walker-rolling Max distance: 10 ft   Walk 10 feet activity   Assist     Assist level: Moderate Assistance - Patient -   50 - 74% Assistive device: Walker-rolling   Walk 50 feet activity   Assist Walk 50 feet with 2 turns activity did not occur: Safety/medical concerns(pain)         Walk 150 feet activity   Assist Walk 150 feet activity did not occur: Safety/medical concerns         Walk 10 feet on uneven surface  activity   Assist Walk 10 feet on uneven surfaces activity did not occur: Safety/medical concerns         Wheelchair     Assist Will patient use wheelchair at discharge?: Yes Type  of Wheelchair: Manual    Wheelchair assist level: Minimal Assistance - Patient > 75% Max wheelchair distance: 50 ft    Wheelchair 50 feet with 2 turns activity    Assist        Assist Level: Minimal Assistance - Patient > 75%   Wheelchair 150 feet activity     Assist Wheelchair 150 feet activity did not occur: Safety/medical concerns          Medical Problem List and Plan: 1.Functional deficits and lower extremity weaknesssecondary to lumbar stenosis and poly-radiculopathy s/p decompression and L3-S1 fusion with subsequent re-do, also debilitated CIR PT, OT Team conference today please see physician documentation under team conference tab, met with team face-to-face to discuss problems,progress, and goals. Formulized individual treatment plan based on medical history, underlying problem and comorbidities. 2. Antithrombotics: -DVT/anticoagulation:Pharmaceutical:Heparin -antiplatelet therapy: N/A 3. Pain Management:Will continue oxycodone and flexeril prn-- -schedule oxycodone prior to AM therapies to help with tolerance as pain has been a major inhibiting factor. Pt/husband in agreement -d/c hydrocodone and robaxin to clear medicaiton list. -Will continue lidocaine patch to left hip. 4. Mood:Team to provide ego support. LCSW to follow for evaluation and support when appropriate.2 -antipsychotic agents: Continue Abilify. 5. Neuropsych: This patientis notfullycapable of making decisions onherown behalf. 6. Skin/Wound Care:Routine pressure relief measures. 7. Fluids/Electrolytes/Nutrition:Monitor I/O. Check lytes in am.  8. HTN: Monitor BP tid--continue Cozaar and Norvasc.  Vitals:   04/16/19 0532 04/16/19 0534  BP: (!) 115/54 119/63  Pulse: 90   Resp: 18 18  Temp: 97.6 F (36.4 C)   SpO2: 95%   low BP yest pm, check ortho vitals 9. Acute on chronic renal failure?:SCr  was around 1.0 prior to admission.Recheck labs in am--avoid hypotension. Monitor SCr which is trending upwards--question hypoperfusion.  10. Delirium: Keep lights on during the day --monitor sleep wake cycle and set schedule. -careful with opiates 11. MDD with anxiety:Was undergoing ECT at [facility] every 3 months (prior to start of pandemic) Continue Abilify, Cymbaltaand Wellbutrin with klonopin prn for anxiety.  12. ABLA: Monitor for signs of bleeding. Will recheck CBC in am. 13. Leucocytosis: Monitor for fevers and other signs of infection. Likely due ot low dose prednisone- afebrile since admit to rehab    LOS: 2 days A FACE TO FACE EVALUATION WAS PERFORMED   E  04/16/2019, 9:07 AM    

## 2019-04-16 NOTE — Progress Notes (Signed)
Occupational Therapy Session Note  Patient Details  Name: Tiffany Gill MRN: [id number] Date of Birth: April 18, 1941  Today's Date: 04/16/2019 OT Individual Time: 0700-0800 OT Individual Time Calculation (min): 60 min    Short Term Goals: Week 1:  OT Short Term Goal 1 (Week 1): Pt will perform 3/3 toileting steps with mod A OT Short Term Goal 2 (Week 1): Pt will perform sit to stands in prep for clothing management with supervision OT Short Term Goal 3 (Week 1): Pt will don UB clothing with min A OT Short Term Goal 4 (Week 1): Pt will perform bed mobility with min A with cues in prep for ADL task  Skilled Therapeutic Interventions/Progress Updates:    Pt resting in bed upon arrival eating breakfast.  Assisted with repositioning pt in bed to complete breakfast prior to engaging in BADL training and functional mobility.  OT intervention with focus on bed mobility, sit<>stand, stand pivot transfers, BADL training, and activity tolerance to increase independence with BADLs. Pt required min A for supine to sit with min verbal cues for sequencing and technique.  Pt maintains sitting balance with supervision.  Pt required min A for donning LSO. Pt required min A for sit<>stand and stand pivot transfer to w/c.  Pt with difficulty advancing LLE.  Pt with increased anxiety with all transitional movements.  Pt engaged in bathing/dressing with sit<>stand from w/c at sink.  Pt requires min A for sit<>stand and standing balance.  Pt requires max A for LB dressing tasks without AE. Pt remained seated in w/c with belt alarm activated and all needs within reach.   Therapy Documentation Precautions:  Precautions Precautions: Back, Fall Precaution Booklet Issued: No Precaution Comments: reviewed back precautions Required Braces or Orthoses: Spinal Brace Spinal Brace: Applied in sitting position, Lumbar corset Restrictions Weight Bearing Restrictions: No Pain: Pain Assessment Pain Scale: 0-10 Pain Score:  8  Pain Type: Acute pain;Surgical pain Pain Location: Back Pain Orientation: Mid;Lower Pain Descriptors / Indicators: Aching Pain Frequency: Constant Pain Onset: On-going Patients Stated Pain Goal: 4 Pain Intervention(s):RN M[place] notified and repositioned  Therapy/Group: Individual Therapy  [name] 04/16/2019, 9:11 AM

## 2019-04-16 NOTE — Progress Notes (Addendum)
Occupational Therapy Session Note  Patient Details  Name: Tiffany Gill MRN: [id number] Date of Birth: 12-13-1940  Today's Date: 04/16/2019 OT Individual Time: 1020-1100 OT Individual Time Calculation (min): 40 min    Short Term Goals: Week 1:  OT Short Term Goal 1 (Week 1): Pt will perform 3/3 toileting steps with mod A OT Short Term Goal 2 (Week 1): Pt will perform sit to stands in prep for clothing management with supervision OT Short Term Goal 3 (Week 1): Pt will don UB clothing with min A OT Short Term Goal 4 (Week 1): Pt will perform bed mobility with min A with cues in prep for ADL task  Skilled Therapeutic Interventions/Progress Updates:    Pt received in wc with spouse in the room. Pt stated she was hurting a great deal but was willing to try.  Reviewed back precautions which pt was able to recall with mod verbal cues.   BLE AROM with knee extension and ankle pumps. Limited L ankle dorsiflexion.  Instructed pt in how to do self ROM to ankle using the gait belt.  Demonstrated to her spouse and encouraged him to have her work on that stretch 30 seconds at a time in between therapies.  Practiced wt shift forward to work on scooting forward and sit to partial stand by pushing up with hands from arm rests.  Pt was so distracted by pain that her attention span was only a few seconds.  Spoke with nurse who brought more pain medication.  At this point, pt was very tired. Attempted a full stand 3x to stand pivot to bed but she was in too much pain so did a squat pivot with mod A to bed.  Moved into supine mod A.  Placed pillow under knees for support.  Pt resting comfortably. All needs met and bed alarm set.   Therapy Documentation Precautions:  Precautions Precautions: Back, Fall Precaution Booklet Issued: No Precaution Comments: reviewed back precautions Required Braces or Orthoses: Spinal Brace Spinal Brace: Applied in sitting position, Lumbar corset Restrictions Weight Bearing  Restrictions: No      Pain: Pain Assessment Pain Scale: 0-10 Pain Score: 9  Pain Type: Acute pain;Surgical pain Pain Location: Back Pain Orientation: Mid;Lower Pain Descriptors / Indicators: Stabbing Pain Onset: Progressive Patients Stated Pain Goal: 4 Pain Intervention(s): RN made aware   Therapy/Group: Individual Therapy  [place] 04/16/2019, 11:08 AM

## 2019-04-16 NOTE — IPOC Note (Signed)
Overall Plan of Care [facility]) Patient Details Name: Tiffany Gill MRN: [id number] DOB: [birth year]  Admitting Diagnosis: <principal problem not specified>  Hospital Problems: Active Problems:   Spondylolisthesis of lumbar region     Functional Problem List: Nursing Bladder, Endurance, Safety, Skin Integrity  PT Balance, Behavior, Edema, Endurance, Motor, Pain, Perception, Safety, Sensory  OT Balance, Cognition, Endurance, Motor, Pain, Skin Integrity, Safety  SLP Cognition  TR         Basic ADL's: OT Grooming, Bathing, Dressing, Toileting     Advanced  ADL's: OT       Transfers: PT Bed Mobility, Bed to Chair, Car, [employer], Futures trader, Metallurgist: PT Ambulation, Emergency planning/management officer, Stairs     Additional Impairments: OT None  SLP Social Cognition   Memory, Awareness, Problem Solving  TR      Anticipated Outcomes Item Anticipated Outcome  Self Feeding n/a  Swallowing      Basic self-care  supervision  Toileting  supervision   Bathroom Transfers supervision  Bowel/Bladder  To be continent to B7B with min. assist.  Transfers  supervision  Locomotion  supervision  Communication     Cognition  Supervision  Pain  Less than 3,on 1 to 10 scale.  Safety/Judgment  Free from falls during stay in rehab   Therapy Plan: PT Intensity: Minimum of 1-2 x/day ,45 to 90 minutes PT Frequency: 5 out of 7 days PT Duration Estimated Length of Stay: 12-14 days OT Intensity: Minimum of 1-2 x/day, 45 to 90 minutes OT Frequency: 5 out of 7 days OT Duration/Estimated Length of Stay: ~10-14 days SLP Intensity: Minumum of 1-2 x/day, 30 to 90 minutes SLP Frequency: 1 to 3 out of 7 days SLP Duration/Estimated Length of Stay: 12-14 days   Due to the current state of emergency, patients may not be receiving their 3-hours of Medicare-mandated therapy.   Team Interventions: Nursing Interventions Patient/Family Education, Pain Management, Skin Care/Wound  Management, Bladder Management  PT interventions Ambulation/gait training, DME/adaptive equipment instruction, UE/LE Strength taining/ROM, Psychosocial support, Balance/vestibular training, Functional electrical stimulation, Skin care/wound management, UE/LE Coordination activities, Cognitive remediation/compensation, Functional mobility training, Visual/perceptual remediation/compensation, Community reintegration, Neuromuscular re-education, Wheelchair propulsion/positioning, IT trainer, Therapeutic Activities, Discharge planning, Pain management, Disease management/prevention, Patient/family education, Therapeutic Exercise  OT Interventions Balance/vestibular training, Discharge planning, Pain management, Self Care/advanced ADL retraining, Therapeutic Activities, UE/LE Coordination activities, Cognitive remediation/compensation, Academic librarian, Engineer, drilling, Neuromuscular re-education, Psychosocial support, UE/LE Strength taining/ROM, Wheelchair propulsion/positioning, Visual/perceptual remediation/compensation, Therapeutic Exercise, Skin care/wound managment, Patient/family education, Functional mobility training, Splinting/orthotics  SLP Interventions Cognitive remediation/compensation, English as a second language teacher, Environmental controls, Functional tasks, Patient/family education, Internal/external aids  TR Interventions    SW/CM Interventions Discharge Planning, Psychosocial Support, Patient/Family Education   Barriers to Discharge MD  Medical stability  Nursing      PT Behavior, Inaccessible home environment anxiety with mobility, STE and 2 level home  OT      SLP      SW       Team Discharge Planning: Destination: PT-Home ,OT- Home , SLP-Home Projected Follow-up: PT-Home health PT, OT-  Home health OT, SLP-Home Health SLP, 24 hour supervision/assistance Projected Equipment Needs: PT-To be determined, OT- To be determined, SLP-None recommended by SLP Equipment  Details: PT- , OT-  Patient/family involved in discharge planning: PT- Patient, Family member/caregiver,  OT-Patient, SLP-Patient  MD ELOS: 14-20d Medical Rehab Prognosis:  Good Assessment:  78 year old female with history of HTN, MDD, memory loss, LBP radiating to  LLE>RLE due to spondylolisthesis with facet arthropathy and severe spinal stenosis affecting L4 and L5 nerve roots s/p 20 for L4-L5 decompressive laminectomy with fusion 03/17/19 who started developing progressive LBP and hip pain with follow up X rays in office revealing subsistence of spacer. She was admitted on 03/24/19 for work up and pain management. CT spine done revealing subsidence of right spacer into superior endplate of L5 but overall stable hardware. 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 She did sustained a fall striking her head on 07/04 with subsequent occipital laceration. 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 CT left hip was negative for fracture.   She continued to have issues with delirium and buttock pain therefore hospitalist were consulted for input she was treated with fluid bolus with improvement in renal status and mentation. They recommended MRI lumbar spine which was done 04/02/19 showing severe thecal sac effacement L4/L5 due to listhesis with epidural collection at L5 and subdural collection from L2-L4 with displacement. Head CT repeated and was negative for bleed. Medications adjusted to help with pain management but she continued to have pain and anxiety limiting overall mobility. After discussion with husband, she was taken by to OR for re-exploration of L4/L5 with removal of residual spinous process and redo foraminotomies with screw fixation L3-S1 on 04/08/19 by Dr. Saintclair Gill. Post op, she has had great improvement in pain  and mild left foot drop reported to be stable   See Team Conference Notes for weekly updates to the plan of care   Now requiring 24/7 Rehab RN,MD, as well as CIR level PT, OT and SLP.  Treatment team will focus on ADLs and mobility with goals set at Supervision

## 2019-04-16 NOTE — Progress Notes (Signed)
Occupational Therapy Session Note  Patient Details  Name: FREDA JAQUITH MRN: [id number] Date of Birth: 04-13-1941  Today's Date: 04/16/2019 OT Individual Time: 1130-1155 OT Individual Time Calculation (min): 25 min    Short Term Goals: Week 1:  OT Short Term Goal 1 (Week 1): Pt will perform 3/3 toileting steps with mod A OT Short Term Goal 2 (Week 1): Pt will perform sit to stands in prep for clothing management with supervision OT Short Term Goal 3 (Week 1): Pt will don UB clothing with min A OT Short Term Goal 4 (Week 1): Pt will perform bed mobility with min A with cues in prep for ADL task  Skilled Therapeutic Interventions/Progress Updates:    Pt resting in bed upon arrival with husband present.  Pt just returned to bed after a busy morning of therapy.  Ot intervention with focus on family education, purpose of OT and therapy while in Rehab, bed mobility, and discharge planning.  Pt requires mod A for bed mobility to reposition.  Pt states she is exhausted from a full morning of therapy.  Request to scheduling to space out therapies to allow for rest breaks.  Pt remained in bed with all needs within reach and bed alarm activated.  Husband present.   Therapy Documentation Precautions:  Precautions Precautions: Back, Fall Precaution Booklet Issued: No Precaution Comments: reviewed back precautions Required Braces or Orthoses: Spinal Brace Spinal Brace: Applied in sitting position, Lumbar corset Restrictions Weight Bearing Restrictions: No   Pain: Pain Assessment Pain Scale: 0-10 Pain Score: 7  Pain Type: Acute pain;Surgical pain Pain Location: Back Pain Orientation: Lower Pain Descriptors / Indicators: Aching Pain Frequency: Constant Pain Onset: On-going Patients Stated Pain Goal: 4 Pain Intervention(s): Pt premedicated prior to therapy  Therapy/Group: Individual Therapy  [name] 04/16/2019, 11:58 AM

## 2019-04-17 ENCOUNTER — Inpatient Hospital Stay (HOSPITAL_COMMUNITY): Payer: Medicare Other

## 2019-04-17 ENCOUNTER — Inpatient Hospital Stay (HOSPITAL_COMMUNITY): Payer: Medicare Other | Admitting: Physical Therapy

## 2019-04-17 LAB — OCCULT BLOOD X 1 CARD TO LAB, STOOL: Fecal Occult Bld: NEGATIVE

## 2019-04-17 NOTE — Progress Notes (Signed)
Occupational Therapy Session Note  Patient Details  Name: Tiffany Gill MRN: [id number] Date of Birth: [birth year]  Today's Date: 04/17/2019 OT Individual Time: 1300-1340 OT Individual Time Calculation (min): 40 min    Short Term Goals: Week 1:  OT Short Term Goal 1 (Week 1): Pt will perform 3/3 toileting steps with mod A OT Short Term Goal 2 (Week 1): Pt will perform sit to stands in prep for clothing management with supervision OT Short Term Goal 3 (Week 1): Pt will don UB clothing with min A OT Short Term Goal 4 (Week 1): Pt will perform bed mobility with min A with cues in prep for ADL task  Skilled Therapeutic Interventions/Progress Updates:    Pt resting in bed upon arrival, laying diagonally in bed.  Pt's husband present.  Pt required min A for supine>sit EOB with increased pain noted by pt. Pt required min A to don LSO.  Sitting balance with supervision.  Pt with ongoing anxiety in anticipation of and during transitional movements.  Sit<>stand from EOB X 4 with min A; slight posterior lean requiring min A for correction.  Pt performed stand pivot tranfser with mod A.  Pt with difficulty managing BLE and advancing.  Sit<>stand from w/c X 4 with min A.  Pt remained in w/c with lunch tray positioned on table and husband present.  Belt alarm activated.   Therapy Documentation Precautions:  Precautions Precautions: Back, Fall Precaution Booklet Issued: No Precaution Comments: reviewed back precautions Required Braces or Orthoses: Spinal Brace Spinal Brace: Applied in sitting position, Lumbar corset Restrictions Weight Bearing Restrictions: No    Pain: Pain Assessment Pain Scale: 0-10 Pain Score: 8  Pain Type: Acute pain;Surgical pain Pain Location: Back Pain Orientation: Lower Pain Descriptors / Indicators: Aching Pain Frequency: Constant Pain Onset: On-going Patients Stated Pain Goal: 4 Pain Intervention(s):RN aware and meds admin; repositioned    Therapy/Group:  Individual Therapy  [name] 04/17/2019, 1:31 PM

## 2019-04-17 NOTE — Progress Notes (Signed)
Occupational Therapy Session Note  Patient Details  Name: Tiffany Gill MRN: [id number] Date of Birth: [birth year]  Today's Date: 04/17/2019 OT Individual Time: 3734-2876 OT Individual Time Calculation (min): 70 min    Short Term Goals: Week 1:  OT Short Term Goal 1 (Week 1): Pt will perform 3/3 toileting steps with mod A OT Short Term Goal 2 (Week 1): Pt will perform sit to stands in prep for clothing management with supervision OT Short Term Goal 3 (Week 1): Pt will don UB clothing with min A OT Short Term Goal 4 (Week 1): Pt will perform bed mobility with min A with cues in prep for ADL task  Skilled Therapeutic Interventions/Progress Updates:    Pt resting in bed upon arrival.  NT had just assisted pt with use of bed pan for BM. Pt agreeable to therapy and getting OOB.  Supine>sit EOB with min A-pt pushing up from sidelying better this morning.  Pt very anxious this morning and hesitant with sit<>stand.  Pt states she feels weak and hands trembling.  Sit<>stand with min A and min A for standing balance.  Attempted stand pivot transfer with RW but pt unable to lift feet and advance.  Pt performed squat pivot transfer to w/c with mod A. Pt declined bathing this morning or changing shirt.  Pt required max A for donning pants and tot A for donning socks/shoes. Pt transitioned to gym and performed sit<>stand X 2 with increased pain and anxiety. RN admin Tylenol for pain. Pt with increased anxiety this morning which is inhibiting pt's tolerance of standing and transitional movements.  Pt returned to room and remained in w/c with belt alarm activated and all needs within reach.   Therapy Documentation Precautions:  Precautions Precautions: Back, Fall Precaution Booklet Issued: No Precaution Comments: reviewed back precautions Required Braces or Orthoses: Spinal Brace Spinal Brace: Applied in sitting position, Lumbar corset Restrictions Weight Bearing Restrictions: No  Pain: Pt c/o 9/10  back pain resting and when standing; RN [place] aware and admin mends   Therapy/Group: Individual Therapy  [name] 04/17/2019, 9:29 AM

## 2019-04-17 NOTE — Progress Notes (Signed)
[place] PHYSICAL MEDICINE & REHABILITATION PROGRESS NOTE   Subjective/Complaints:  Remains anxious during therapy according to OT, some pain complaints as well  ROS- neg CP SOB, N/V/D  Objective:   No results found. Recent Labs    04/15/19 0854 04/16/19 0520  WBC 8.3 8.1  HGB 8.0* 8.0*  HCT 24.8* 25.1*  PLT 415* 433*   Recent Labs    04/15/19 0854  NA 137  K 4.0  CL 105  CO2 21*  GLUCOSE 83  BUN 13  CREATININE 0.97  CALCIUM 9.4    Intake/Output Summary (Last 24 hours) at 04/17/2019 1554 Last data filed at 04/17/2019 1300 Gross per 24 hour  Intake 380 ml  Output -  Net 380 ml     Physical Exam: Vital Signs Blood pressure 105/70, pulse 88, temperature (!) 97.4 F (36.3 C), resp. rate 18, height 5' (1.524 m), weight 58.5 kg, SpO2 97 %.   General: No acute distress Mood and affect are appropriate Heart: Regular rate and rhythm no rubs murmurs or extra sounds Lungs: Clear to auscultation, breathing unlabored, no rales or wheezes Abdomen: Positive bowel sounds, soft nontender to palpation, nondistended Extremities: No clubbing, cyanosis, or edema  Neurologic: Cranial nerves II through XII intact, motor strength is 4/5 in bilateral deltoid, bicep, tricep, grip,Right  hip flexor, knee extensors, ankle dorsiflexor and plantar flexor 3- Left HF, 4- KE , 3- [place] DF unchanged  Musculoskeletal: Full range of motion in all 4 extremities. No joint swelling   Assessment/Plan: 1. Functional deficits secondary to Lumbar radiculopathy with LLE weakness which require 3+ hours per day of interdisciplinary therapy in a comprehensive inpatient rehab setting.  Physiatrist is providing close team supervision and 24 hour management of active medical problems listed below.  Physiatrist and rehab team continue to assess barriers to discharge/monitor patient progress toward functional and medical goals  Care Tool:  Bathing    Body parts bathed by patient: Right arm,  Left arm, Chest, Abdomen, Right upper leg, Left upper leg   Body parts bathed by helper: Front perineal area, Buttocks, Right lower leg, Left lower leg     Bathing assist Assist Level: Moderate Assistance - Patient 50 - 74%     Upper Body Dressing/Undressing Upper body dressing   What is the patient wearing?: Pull over shirt    Upper body assist Assist Level: Moderate Assistance - Patient 50 - 74%    Lower Body Dressing/Undressing Lower body dressing      What is the patient wearing?: Incontinence brief     Lower body assist Assist for lower body dressing: Maximal Assistance - Patient 25 - 49%     Toileting Toileting    Toileting assist Assist for toileting: Moderate Assistance - Patient 50 - 74%     Transfers Chair/bed transfer  Transfers assist     Chair/bed transfer assist level: Minimal Assistance - Patient > 75%     Locomotion Ambulation   Ambulation assist      Assist level: Moderate Assistance - Patient 50 - 74% Assistive device: Walker-rolling Max distance: 10'   Walk 10 feet activity   Assist     Assist level: Moderate Assistance - Patient - 50 - 74% Assistive device: Walker-rolling   Walk 50 feet activity   Assist Walk 50 feet with 2 turns activity did not occur: Safety/medical concerns(pain)         Walk 150 feet activity   Assist Walk 150 feet activity did not occur: Safety/medical concerns  Walk 10 feet on uneven surface  activity   Assist Walk 10 feet on uneven surfaces activity did not occur: Safety/medical concerns         Wheelchair     Assist Will patient use wheelchair at discharge?: Yes Type of Wheelchair: Manual    Wheelchair assist level: Minimal Assistance - Patient > 75%, Set up assist Max wheelchair distance: 61'    Wheelchair 50 feet with 2 turns activity    Assist        Assist Level: Minimal Assistance - Patient > 75%   Wheelchair 150 feet activity     Assist  Wheelchair 150 feet activity did not occur: Safety/medical concerns          Medical Problem List and Plan: 1.Functional deficits and lower extremity weaknesssecondary to lumbar stenosis and poly-radiculopathy s/p decompression and L3-S1 fusion with subsequent re-do, also debilitated CIR PT, OT  2. Antithrombotics: -DVT/anticoagulation:Pharmaceutical:Heparin -antiplatelet therapy: N/A 3. Pain Management:Will continue oxycodone and flexeril prn-- -schedule oxycodone prior to AM therapies to help with tolerance as pain has been a major inhibiting factor. Pt/husband in agreement -d/c hydrocodone and robaxin to clear medicaiton list. -Will continue lidocaine patch to left hip. 4. Mood:Team to provide ego support. LCSW to follow for evaluation and support when appropriate.2 -antipsychotic agents: Continue Abilify. 5. Neuropsych: This patientis notfullycapable of making decisions onherown behalf. 6. Skin/Wound Care:Routine pressure relief measures. 7. Fluids/Electrolytes/Nutrition:Monitor I/O. Check lytes in am.  8. HTN: Monitor BP tid--continue Cozaar and Norvasc.  Vitals:   04/17/19 1125 04/17/19 1403  BP: 120/78 105/70  Pulse: 86 88  Resp:  18  Temp:  (!) 97.4 F (36.3 C)  SpO2:  97%  low BP yest pm, check ortho vitals 9. Acute on chronic renal failure?:SCr was around 1.0 prior to admission.Recheck labs in am--avoid hypotension. Monitor SCr which is trending upwards--question hypoperfusion.  10. Delirium: Keep lights on during the day --monitor sleep wake cycle and set schedule. -careful with opiates 11. MDD with anxiety:Was undergoing ECT at [facility] every 3 months (prior to start of pandemic) Continue Abilify, Cymbaltaand Wellbutrin with klonopin prn for anxiety.  12. ABLA: Monitor for signs of bleeding. Will recheck CBC in am. 13. Leucocytosis: Monitor for fevers and  other signs of infection. Likely due ot low dose prednisone- afebrile since admit to rehab    LOS: 3 days A FACE TO [place]  04/17/2019, 3:54 PM

## 2019-04-17 NOTE — Plan of Care (Signed)
  Problem: Consults Goal: RH SPINAL CORD INJURY PATIENT EDUCATION Description:  See Patient Education module for education specifics.  Outcome: Progressing   Problem: SCI BOWEL ELIMINATION Goal: RH STG MANAGE BOWEL WITH ASSISTANCE Description: STG Manage Bowel with min. Assistance. Outcome: Progressing Goal: RH STG SCI MANAGE BOWEL WITH MEDICATION WITH ASSISTANCE Description: STG SCI Manage bowel with medication with min assistance. Outcome: Progressing   Problem: SCI BLADDER ELIMINATION Goal: RH STG MANAGE BLADDER WITH ASSISTANCE Description: STG Manage Bladder With min. Assistance Outcome: Progressing   Problem: RH SKIN INTEGRITY Goal: RH STG SKIN FREE OF INFECTION/BREAKDOWN Description: With min. assist Outcome: Progressing Goal: RH STG MAINTAIN SKIN INTEGRITY WITH ASSISTANCE Description: STG Maintain Skin Integrity With min. Assistance. Outcome: Progressing Goal: RH STG ABLE TO PERFORM INCISION/WOUND CARE W/ASSISTANCE Description: STG Able To Perform Incision/Wound Care With min.Assistance. Outcome: Progressing   Problem: RH SAFETY Goal: RH STG ADHERE TO SAFETY PRECAUTIONS W/ASSISTANCE/DEVICE Description: STG Adhere to Safety Precautions With cues and reminder Assistance/Device. Outcome: Progressing   Problem: RH PAIN MANAGEMENT Goal: RH STG PAIN MANAGED AT OR BELOW PT'S PAIN GOAL Description: Less than 3. Outcome: Progressing   Problem: RH KNOWLEDGE DEFICIT SCI Goal: RH STG INCREASE KNOWLEDGE OF SELF CARE AFTER SCI Description: Pt. And family will be able to follow the safety precations to prevent falls at home. Outcome: Progressing

## 2019-04-17 NOTE — Progress Notes (Signed)
Physical Therapy Session Note  Patient Details  Name: Tiffany Gill MRN: [id number] Date of Birth: January 05, 1941  Today's Date: 04/17/2019 PT Individual Time: 1015-1130 PT Individual Time Calculation (min): 75 min   Short Term Goals: Week 1:  PT Short Term Goal 1 (Week 1): Pt will perform bed mobility with min assist PT Short Term Goal 2 (Week 1): Pt will perform bed<>chair transfer with  min assist PT Short Term Goal 3 (Week 1): Pt will ambulated x 50 ft with RW and min assist  Skilled Therapeutic Interventions/Progress Updates:     Patient in w/c with husband in room upon PT arrival. Patient alert and agreeable to PT session. Patient reported increased back pain and B LE fatigue and soreness at beginning of session. She also stated that she had considered getting back to bed prior to session on her own, but decided not to due to the clutter on the bed. PT re-educated about calling for assistance to get up due to increased fall risk, educated about seat belt alarm and use of call bell. Patient in agreement that she needs assistance to get up at this time and agreed to use call bell and demonstrated how to use it with min cues. Patient stated that she felt her sessions were very long today. PT discussed having more shorter sessions throughout the day and patient and her husband agreed that may work better, scheduling made aware. Patient also stated that she gets bored between sessions. PT discussed patient hobbies and she stated she likes to read and listen to classical music. Husband stated he would bring in some books for her tomorrow. Requested recreational therapy consult to address stress/anxiety and address leasure activities.    Therapeutic Activity: Bed Mobility: Patient performed sit to supine with supervision performing log roll technique to maintain spinal precautions in a flat bed without use of the bed rail. Provided verbal cues for log roll technique. Transfers: Patient performed sit  to/from stand x3 with mod A using a RW. Provided verbal cues for performing diaphragmatic breathing proir to stand to reduce stress/anxiety about standing, scooting forward with feet flat on the floor, and hand placement on RW (patient prefers pushing up with both hands on RW, as she is unable to stand pushing up from the w/c). She reported feeling light headed durirng second and thrid stand. BP in sitting after 2 minutes of rest after second stand: 121/62 HR: 93. BP in standing during third stand: 80/69 HR 87. Provided a 2 min sitting rest break due to low BP with light headedness then assisted the patient back to bed performing squat pivot transfer with min A and cues for hand placement and head hips relationship. BP in supine returned to 120/78 HR 86. RN made aware of orthostatic hypotension with standing.  Therapeutic Exercise: Patient performed the following exercises with verbal and tactile cues for proper technique. -B seated marching 2x10 -B LAQ 2x10 -B seated isometric hip adduction with 5 second hold using a pillow 2x10 -B seated isometric hip abduction with 5 second hold using manual resistance 2x10  Patient in bed at end of session with breaks locked, bed alarm set, and all needs within reach.    Therapy Documentation Precautions:  Precautions Precautions: Back, Fall Precaution Booklet Issued: No Precaution Comments: reviewed back precautions Required Braces or Orthoses: Spinal Brace Spinal Brace: Applied in sitting position, Lumbar corset Restrictions Weight Bearing Restrictions: No Vital Signs: Therapy Vitals Temp: (!) 97.4 F (36.3 C) Pulse Rate: 88 Resp: 18  BP: 105/70 Patient Position (if appropriate): Sitting Oxygen Therapy SpO2: 97 % O2 Device: Room Air Pain: Pain Assessment Pain Scale: 0-10 Pain Score: 8  Pain Type: Acute pain;Surgical pain Pain Location: Back Pain Orientation: Lower Pain Descriptors / Indicators: Aching Pain Frequency: Constant Pain  Onset: On-going Pain Intervention(s): RN made aware;Reposition;Distraction;Relaxation    Therapy/Group: Individual Therapy  Maddalynn Barnard L Sussan Meter PT, DPT  04/17/2019, 3:43 PM

## 2019-04-17 NOTE — Progress Notes (Signed)
Physical Therapy Session Note  Patient Details  Name: Tiffany Gill MRN: [id number] Date of Birth: [birth year]  Today's Date: 04/17/2019 PT Individual Time: 1779-3903 PT Individual Time Calculation (min): 30 min   Short Term Goals: Week 1:  PT Short Term Goal 1 (Week 1): Pt will perform bed mobility with min assist PT Short Term Goal 2 (Week 1): Pt will perform bed<>chair transfer with  min assist PT Short Term Goal 3 (Week 1): Pt will ambulated x 50 ft with RW and min assist  Skilled Therapeutic Interventions/Progress Updates:   Pt received asleep in bed, awakens to her name and appears very drowsy but is agreeable to therapy session. Pt speaking in very low voice during session and upon questioning able to recall 2/3 spine precautions (no lifting nor twisting). Supine>sit, HOB flat and using bedrails, via logroll technique with max cuing for sequencing and min assist for trunk upright. Upon sitting on EOB pt states "I don't know where I am." Therapist re-oriented her to location and then pt able to recall situation without cuing. Donned corset brace sitting EOB with min assist for positioning and for improved fit. Stand pivot EOB>w/c using RW with mod assist for lifting and pivoting due to heavy posterior lean; mod/max cuing for sequencing of transfer - pt demonstrates poor proprioceptive awareness of B LE positioning prior to coming to standing. Transported to day room in w/c. Assessed vitals BP 109/53 (MAP 70) and HR 88bpm. Stand pivot w/c>Nustep using RW with mod assist as described above and pt continuing to demonstrate poor proprioceptive awareness of B LEs as well as initiating sitting prior to turning fully despite cuing. Performed B LE reciprocal movements on Nustep focusing on neuro re-ed and strengthening against level 1 resistance for 4 minutes totaling 50 steps with pt requiring constant cuing to maintain task and frequent min assist for pushing with L LE as pt demonstrates poor muscle  recruitment and strength to move pedal. Squat pivot Nustep>w/c with mod assist for lifting/pivoting hips and manual facilitation for proper B LE placement. Transported back to room and pt left sitting in w/c with needs in reach, seat belt alarm on, and reinforced education on using call bell to call for nursing assistance.  Therapy Documentation Precautions:  Precautions Precautions: Back, Fall Precaution Booklet Issued: No Precaution Comments: reviewed back precautions Required Braces or Orthoses: Spinal Brace Spinal Brace: Applied in sitting position, Lumbar corset Restrictions Weight Bearing Restrictions: No  Vital Signs: Therapy Vitals Temp: (!) 97.4 F (36.3 C) Pulse Rate: 88 Resp: 18 BP: (!) 109/53 Patient Position (if appropriate): Sitting Oxygen Therapy SpO2: 97 % O2 Device: Room Air   Pain: Denies pain upon questioning and no reports of pain during session.  Therapy/Group: Individual Therapy  Tawana Scale, PT, DPT 04/17/2019, 4:12 PM

## 2019-04-18 ENCOUNTER — Inpatient Hospital Stay (HOSPITAL_COMMUNITY): Payer: Medicare Other | Admitting: Speech Pathology

## 2019-04-18 ENCOUNTER — Inpatient Hospital Stay (HOSPITAL_COMMUNITY): Payer: Medicare Other

## 2019-04-18 NOTE — Progress Notes (Signed)
Occupational Therapy Session Note  Patient Details  Name: Tiffany Gill MRN: [id number] Date of Birth: [birth year]  Today's Date: 04/18/2019 OT Individual Time: 9528-4132 OT Individual Time Calculation (min): 60 min  and Today's Date: 04/18/2019 OT Missed Time: 15 Minutes Missed Time Reason: Pain;Patient fatigue;Other (comment)(anxiety)   Short Term Goals: Week 1:  OT Short Term Goal 1 (Week 1): Pt will perform 3/3 toileting steps with mod A OT Short Term Goal 2 (Week 1): Pt will perform sit to stands in prep for clothing management with supervision OT Short Term Goal 3 (Week 1): Pt will don UB clothing with min A OT Short Term Goal 4 (Week 1): Pt will perform bed mobility with min A with cues in prep for ADL task  Skilled Therapeutic Interventions/Progress Updates:    Pt resting in bed upon arrival and agreeable to therapy.  Supine>sit EOB with min A and min verbal cues for sequencing and technique.  Pt indicating increased back pain with all transitional movements and increased anxiety. Min A for sitting balance to don LSO. Mod A for squat pivot tranfser to w/c.  Attempted stand pivot transfer with RW but pt unable to advance BLE. Pt stated she needed to use bathroom for BM.  Max A squat pivot transfer to [facility].  Pt continued to c/o increased pain and wanted to get back in bed but didn't want to get on bed pan because it was uncomfortable.  She requested to get in bed and have bowel movement on towel.  Informed pt that is not option.  Pt with gas and did void but no bowel movement.  Sit<>stand with mod A for hygiene and donning pants.  Pt with increased anxiety throughout session limiting participation.  Pt missed 15 mins skilled OT services secondary to pain, fatigue, and anxiety.  Pt remained seated in w/c with belt alarm activated and all needs within reach.   Therapy Documentation Precautions:  Precautions Precautions: Back, Fall Precaution Booklet Issued: No Precaution Comments:  reviewed back precautions Required Braces or Orthoses: Spinal Brace Spinal Brace: Applied in sitting position, Lumbar corset Restrictions Weight Bearing Restrictions: No General: General OT Amount of Missed Time: 15 Minutes Pain: Pt c/o 10/10 back pain with all transitional movements; RN [place] aware and repositioned in w/c with LSO donned  Therapy/Group: Individual Therapy  [name] 04/18/2019, 9:25 AM

## 2019-04-18 NOTE — Plan of Care (Signed)
  Problem: Consults Goal: RH SPINAL CORD INJURY PATIENT EDUCATION Description:  See Patient Education module for education specifics.  04/18/2019 1017 by Gerald Stabs, RN Outcome: Progressing 04/18/2019 1017 by Gerald Stabs, RN Outcome: Progressing   Problem: SCI BOWEL ELIMINATION Goal: RH STG MANAGE BOWEL WITH ASSISTANCE Description: STG Manage Bowel with min. Assistance. 04/18/2019 1017 by Gerald Stabs, RN Outcome: Progressing 04/18/2019 1017 by Gerald Stabs, RN Outcome: Progressing Goal: RH STG SCI MANAGE BOWEL WITH MEDICATION WITH ASSISTANCE Description: STG SCI Manage bowel with medication with min assistance. 04/18/2019 1017 by Gerald Stabs, RN Outcome: Progressing 04/18/2019 1017 by Gerald Stabs, RN Outcome: Progressing   Problem: SCI BLADDER ELIMINATION Goal: RH STG MANAGE BLADDER WITH ASSISTANCE Description: STG Manage Bladder With min. Assistance 04/18/2019 1017 by Gerald Stabs, RN Outcome: Progressing 04/18/2019 1017 by Gerald Stabs, RN Outcome: Progressing   Problem: RH SKIN INTEGRITY Goal: RH STG SKIN FREE OF INFECTION/BREAKDOWN Description: With min. assist 04/18/2019 1017 by Gerald Stabs, RN Outcome: Progressing 04/18/2019 1017 by Gerald Stabs, RN Outcome: Progressing Goal: RH STG MAINTAIN SKIN INTEGRITY WITH ASSISTANCE Description: STG Maintain Skin Integrity With min. Assistance. 04/18/2019 1017 by Gerald Stabs, RN Outcome: Progressing 04/18/2019 1017 by Gerald Stabs, RN Outcome: Progressing Goal: RH STG ABLE TO PERFORM INCISION/WOUND CARE W/ASSISTANCE Description: STG Able To Perform Incision/Wound Care With min.Assistance. 04/18/2019 1017 by Gerald Stabs, RN Outcome: Progressing 04/18/2019 1017 by Gerald Stabs, RN Outcome: Progressing   Problem: RH SAFETY Goal: RH STG ADHERE TO SAFETY PRECAUTIONS W/ASSISTANCE/DEVICE Description: STG Adhere to Safety Precautions With cues and reminder Assistance/Device. 04/18/2019 1017  by Gerald Stabs, RN Outcome: Progressing 04/18/2019 1017 by Gerald Stabs, RN Outcome: Progressing   Problem: RH PAIN MANAGEMENT Goal: RH STG PAIN MANAGED AT OR BELOW PT'S PAIN GOAL Description: Less than 3. 04/18/2019 1017 by Gerald Stabs, RN Outcome: Progressing 04/18/2019 1017 by Gerald Stabs, RN Outcome: Progressing   Problem: RH KNOWLEDGE DEFICIT SCI Goal: RH STG INCREASE KNOWLEDGE OF SELF CARE AFTER SCI Description: Pt. And family will be able to identify environmental hazards and follow safety precations to prevent falls at home with cues and supervision assist from significant other.  04/18/2019 1017 by Gerald Stabs, RN Outcome: Progressing 04/18/2019 1017 by Gerald Stabs, RN Outcome: Progressing

## 2019-04-18 NOTE — Progress Notes (Signed)
Physical Therapy Note  Patient Details  Name: Tiffany Gill MRN: [id number] Date of Birth: 14-Jul-1941 Today's Date: 04/18/2019    Downgraded to 15/7 due to limited participation with therapies secondary to pain and fatigue.   Marriah Sanderlin L Karmyn Lowman PT, DPT  04/18/2019, 11:13 AM

## 2019-04-18 NOTE — Progress Notes (Signed)
Speech Language Pathology Daily Session Note  Patient Details  Name: Tiffany Gill MRN: [id number] Date of Birth: 25-Sep-1941  Today's Date: 04/18/2019 SLP Individual Time: 9357-0177 SLP Individual Time Calculation (min): 25 min  Short Term Goals: Week 1: SLP Short Term Goal 1 (Week 1): Patient will demonstrate recall of functional information with Min A verbal cues for use of external aids. SLP Short Term Goal 2 (Week 1): Patient will self-monitor and correct errors during functional tasks with Min A verbal cues. SLP Short Term Goal 3 (Week 1): Patient will demonstrate functional problem solving for basic and familiar tasks with Min A verbal cues.  Skilled Therapeutic Interventions: Skilled treatment session focused on cognitive goals. Upon arrival, patient was anxious due to pain, RN aware and patient premedicated. Patient independently recalled 1/3 back precautions, therefore, an external aid was created that the patient was able to utilize independently. SLP also utilized a calendar to maximize recall of the date and keep a "countdown" until she can discharge. Patient verbalized frustration with her current therapy schedule due to "strating to early." Patient generated a new therapy schedule with overall Mod A verbal cues for problem solving and reasoning. New schedule will be initiated as able. Patient left upright in wheelchair with NT present. Continue with current plan of care.      Pain Pain Assessment Pain Scale: 0-10 Pain Type: Acute pain;Surgical pain Pain Location: Back Pain Orientation: Lower Pain Descriptors / Indicators: Aching;Constant Pain Frequency: Constant Pain Onset: On-going Patients Stated Pain Goal: 4 Pain Intervention(s): Medication (See eMAR)(oxycodone)  Therapy/Group: Individual Therapy  Monica Zahler 04/18/2019, 12:51 PM

## 2019-04-18 NOTE — Progress Notes (Signed)
Physical Therapy Session Note  Patient Details  Name: Tiffany Gill MRN: [id number] Date of Birth: [birth year]  Today's Date: 04/18/2019 PT Individual Time: 1015-1030 and  1415-1455 PT Individual Time Calculation (min): 15 and 40 min   Short Term Goals: Week 1:  PT Short Term Goal 1 (Week 1): Pt will perform bed mobility with min assist PT Short Term Goal 2 (Week 1): Pt will perform bed<>chair transfer with  min assist PT Short Term Goal 3 (Week 1): Pt will ambulated x 50 ft with RW and min assist  Skilled Therapeutic Interventions/Progress Updates:     Session 1: Patient in w/c with husband in room upon PT arrival. Patient alert and reporting 10/10 low back pain and fatigue and requesting to get back to bed to lie down. RN made aware of pain/fatigue and PT provided repositioning, relaxation, and distraction for pain interventions.  Therapeutic Activity: Bed Mobility: Patient performed sit to supine with min A for LE managment. Provided verbal cues for performing log roll technique to maintain spinal precautions and manage low back pain. . Transfers: Patient performed squat pivot transfer from w/c to bed with min A. Provided verbal cues for head-hips relationship and hand placment.  Educated on diaphragmatic breathing and relaxation techniques while patient was lying in the bed for pain and anxiety management. Patient responded well appearing more relaxed after.   Discussed patients limited participation secondary to pain and fatigue with the patient and her husband. Patient in agreement that she may participate more with fewer hours of therapy a day. PT stated she will discuss this with the team.    Patient in bed at end of session with breaks locked, bed alarm set, and all needs within reach. Patient missed 30 min of skilled PT due to pain/fatigue.   Session 2: Patient in bed with her husband at bedside upon PT arrival. Patient lethargic and shaking LEs upon entry and agreeable to PT  session. Patient was difficult to understand and speaking very softly at beginning of session, seems confused about what she was trying to say. Her husband reported that this is how she is when she takes Flexeril and/or when she takes Clonazepam several days in a row. Discussed husbands concerns and patients status with RN, RN already aware. Reported mild low back pain at beginning of session when provided cues for mild, moderate, or sever pain, unable to rate pain on numeric scale this session. PT provided repositioning and distraction throughout session for pain interventions.   Therapeutic Activity: Bed Mobility: Patient performed supine to/from sit with mod A-min A with increased trunk support this session due to lethargy. Provided verbal cues for log roll technique to maintain spinal precautions. The patient sat EOB for sitting tolerance x10 min with mod A initially and CGA and improved alertness and cognition upon sitting EOB. Patient's husband donned corset brace and the patient doffed the brace with patient sitting EOB with cues for proper placement and technique. Patient reported "I have to lie back down" after 10 min, but was agreeable to continuing with bed level exercises.   Therapeutic Exercise: Patient performed the following exercises with verbal and tactile cues for proper technique. -B Heel slides with manual resistance 2x10 -B quad sets 2x10 with tactile cues for muscle activation and timing -B glut sets 2x10 with tactile cues for muscle activation and timing -Attempted reduced ROM R SLR, but patient cried out in pain after first attempt and exercise was terminated due to pain -B isometric hip adduction  with towel roll between knees 2x10 with 5 sec hold with tactile cues for muscle activation and timing  Patient asleep in bed with her husband at bedside at end of session with breaks locked, bed alarm set, and all needs within reach. Patient missed 5 min skilled PT due to lethargy.     Therapy Documentation Precautions:  Precautions Precautions: Back, Fall Precaution Booklet Issued: No Precaution Comments: reviewed back precautions Required Braces or Orthoses: Spinal Brace Spinal Brace: Applied in sitting position, Lumbar corset Restrictions Weight Bearing Restrictions: No General: PT Amount of Missed Time (min): 30 Minutes and 5 minutes PT Missed Treatment Reason: Patient fatigue;Pain;Lethargy Vital Signs: Therapy Vitals Temp: 97.8 F (36.6 C) Pulse Rate: 88 Resp: 16 BP: 118/73 Patient Position (if appropriate): Lying Oxygen Therapy SpO2: 99 % O2 Device: Room Air    Therapy/Group: Individual Therapy  Tiffany Gill L [name] PT, DPT  04/18/2019, 4:41 PM

## 2019-04-19 ENCOUNTER — Inpatient Hospital Stay (HOSPITAL_COMMUNITY): Payer: Medicare Other

## 2019-04-19 ENCOUNTER — Inpatient Hospital Stay (HOSPITAL_COMMUNITY): Payer: Medicare Other | Admitting: Rehabilitation

## 2019-04-19 ENCOUNTER — Inpatient Hospital Stay (HOSPITAL_COMMUNITY): Payer: Medicare Other | Admitting: Speech Pathology

## 2019-04-19 NOTE — Progress Notes (Signed)
Occupational Therapy Session Note  Patient Details  Name: Tiffany Gill MRN: [id number] Date of Birth: Apr 06, 1941  Today's Date: 04/19/2019 OT Individual Time: 1545-1650 OT Individual Time Calculation (min): 65 min    Short Term Goals: Week 1:  OT Short Term Goal 1 (Week 1): Pt will perform 3/3 toileting steps with mod A OT Short Term Goal 2 (Week 1): Pt will perform sit to stands in prep for clothing management with supervision OT Short Term Goal 3 (Week 1): Pt will don UB clothing with min A OT Short Term Goal 4 (Week 1): Pt will perform bed mobility with min A with cues in prep for ADL task  Skilled Therapeutic Interventions/Progress Updates:    initially attempted to see pt at scheduled time of 8am however pt complained it was too early and does not want to start tx till after 10. Pt agreed upon switching tx time to 1545.  1:1. Pt reporting 6/10 pain premedicated. Pt requires encouragement to participate in session. Pt completes grooming at sink with VC for locating paper towels and encouragement to reach for things on sink instead of asking OT to retrieve them. Pt propels w/c to ADL apartment with VC for larger more efficient pushes. Pt educated on TTB sequencing and transfers. Pt completes MOD A squat pivot w/c<>TTB with VC for initiation. Pt very anxious throughout new type of transfer requiring VC for deep breathing to slow hyperventhilation. Pt completes 5 sit to stands at high low table to fold 3 towels with MIN A to power up and MOD A to maintain balance d/t posterior bias with VC for forward weight shifts. Pt transfers back to bed with MOD A squat pivot and remains in bed at end of session with call light in reach and all needs met  Therapy Documentation Precautions:  Precautions Precautions: Back, Fall Precaution Booklet Issued: No Precaution Comments: reviewed back precautions Required Braces or Orthoses: Spinal Brace Spinal Brace: Applied in sitting position, Lumbar  corset Restrictions Weight Bearing Restrictions: No General:   Vital Signs: Therapy Vitals Temp: 98 F (36.7 C) Temp Source: Oral Pulse Rate: 87 Resp: 14 BP: 140/64 Patient Position (if appropriate): Lying Oxygen Therapy SpO2: 96 % O2 Device: Room Air Pain:   ADL: ADL Eating: Set up Grooming: Setup Where Assessed-Grooming: Sitting at sink Upper Body Bathing: Supervision/safety Where Assessed-Upper Body Bathing: Sitting at sink, Wheelchair Lower Body Bathing: Maximal assistance Where Assessed-Lower Body Bathing: Sitting at sink, Wheelchair Upper Body Dressing: Maximal assistance Where Assessed-Upper Body Dressing: Wheelchair Lower Body Dressing: Maximal assistance Where Assessed-Lower Body Dressing: Sitting at sink, Medical laboratory scientific officer Method: Not assessed Tub/Shower Transfer: Not assessed Social research officer, government: Not assessed Vision   Perception    Praxis   Exercises:   Other Treatments:     Therapy/Group: Individual Therapy  Tonny Branch 04/19/2019, 8:14 AM

## 2019-04-19 NOTE — Progress Notes (Signed)
Social Work Patient ID: Tiffany Gill, female   DOB: 12-11-1940, 78 y.o.   MRN: [id number]   CSW met with pt and her husband on 04-16-19 after team conference meeting to update them on discussion and targeted d/c date of 04-29-19.  Pt/husband were pleased with this.  CSW encouraged pt and told her that she was doing well and to keep working hard.  They were both very appreciative of CIR care and were looking Gill to more time here for pt to get stronger before returning home.  CSW will continue to follow and assist as needed.

## 2019-04-19 NOTE — Progress Notes (Addendum)
Tiffany Gill is a 78 y.o. female July 06, 1941 [id number]  Subjective: No new complaints. No new problems. Slept well. Feeling OK.  Objective: Vital signs in last 24 hours: Temp:  [97.4 F (36.3 C)-98 F (36.7 C)] 98 F (36.7 C) (07/25 0421) Pulse Rate:  [86-88] 87 (07/25 0421) Resp:  [14-16] 14 (07/25 0421) BP: (118-140)/(60-73) 140/64 (07/25 0752) SpO2:  [96 %-100 %] 96 % (07/25 0421) Weight:  [55.3 kg] 55.3 kg (07/25 0421) Weight change:  Last BM Date: 04/17/19  Intake/Output from previous day: 07/24 0701 - 07/25 0700 In: 280 [P.O.:280] Out: -  Last cbgs: CBG (last 3)  No results for input(s): GLUCAP in the last 72 hours.   Physical Exam General: No apparent distress   HEENT: not dry Lungs: Normal effort. Lungs clear to auscultation, no crackles or wheezes. Cardiovascular: Regular rate and rhythm, no edema Abdomen: S/NT/ND; BS(+) Musculoskeletal:  unchanged Neurological: No new neurological deficits Wounds: Clean Skin: clear  Aging changes Mental state: Asleep    Lab Results: BMET    Component Value Date/Time   NA 137 04/15/2019 0854   K 4.0 04/15/2019 0854   CL 105 04/15/2019 0854   CO2 21 (L) 04/15/2019 0854   GLUCOSE 83 04/15/2019 0854   BUN 13 04/15/2019 0854   CREATININE 0.97 04/15/2019 0854   CALCIUM 9.4 04/15/2019 0854   GFRNONAA 56 (L) 04/15/2019 0854   GFRAA >60 04/15/2019 0854   CBC    Component Value Date/Time   WBC 8.1 04/16/2019 0520   RBC 2.46 (L) 04/16/2019 0520   HGB 8.0 (L) 04/16/2019 0520   HCT 25.1 (L) 04/16/2019 0520   PLT 433 (H) 04/16/2019 0520   MCV 102.0 (H) 04/16/2019 0520   MCH 32.5 04/16/2019 0520   MCHC 31.9 04/16/2019 0520   RDW 15.1 04/16/2019 0520   LYMPHSABS 1.8 04/15/2019 0854   MONOABS 0.6 04/15/2019 0854   EOSABS 0.3 04/15/2019 0854   BASOSABS 0.0 04/15/2019 0854    Studies/Results: No results found.  Medications: I have reviewed the patient's current medications.  Assessment/Plan:  1.  Functional  deficits and lower extremity weakness secondary to lumbar stenosis and polyradiculopathy, status post decompression and L3- S1 fusion with subsequent, also debilitated.  Continue CIR  2.  DVT prophylaxis with Coumadin  3.  Pain management with oxycodone and Flexeril as needed.  Lidoderm patch 4.  Ego support. 5.  The patient is not fully capable of making decisions on her own behalf 6.  Routine skin and the wound care 7.  Hypertension.  Continue with Cozaar and Norvasc 8.  Acute on chronic renal failure.  We will continue to monitor labs 9.  Delirium -- will monitor sleep wake cycle 10.  MDD with anxiety.  The patient was undergoing ECT at [facility] every 3 months prior to the epidemic.  Continue Abilify, Cymbalta and Wellbutrin with Klonopin as needed 11.  Acute blood loss anemia.  Monitor CBC 12.  Leukocytosis, continue to monitor      Length of stay, days: 5  Walker Kehr , MD 04/19/2019, 1:15 PM

## 2019-04-19 NOTE — Progress Notes (Signed)
Physical Therapy Session Note  Patient Details  Name: Tiffany Gill MRN: [id number] Date of Birth: 09/25/1941  Today's Date: 04/19/2019 PT Individual Time: 9774-1423 PT Individual Time Calculation (min): 55 min   Short Term Goals: Week 1:  PT Short Term Goal 1 (Week 1): Pt will perform bed mobility with min assist PT Short Term Goal 2 (Week 1): Pt will perform bed<>chair transfer with  min assist PT Short Term Goal 3 (Week 1): Pt will ambulated x 50 ft with RW and min assist  Skilled Therapeutic Interventions/Progress Updates:   Pt received lying in bed, husband in room.  Pt reporting she needs to use restroom, therefore pulled out 3-in-1, however was too elevated and unable to lower therefore retrieved drop arm bedside commode which was appropriate height.  Performed bed mobility at CGA to S level today with cues for log roll technique.  Once at EOB, PT donned corset.  Performed stand pivot transfer with RW at min/mod A with cues for sequencing and RW placement.  Pt continues to be very anxious when upright and tends to want to sit quickly.  Pt unable to allow PT to remove brief before voiding, therefore had pt stand again, move brief, and allow for total A pericare during session.  Performed another transfer back to bed as above with pt returning to supine so that PT could assist with donning new brief.  Performed rolling at S level with bed rails and cuing.  Pt agreeable to get back up and into recliner, therefore performed bed mobility as above and transfer to recliner.  Once seated she states "are we going to do anymore."  PT asked if she was willing to attempt gait in room.  Pt agreeable therefore with +2A pt ambulated x 3' forwards/backwards with RW at mod A level with cues to continue with gait, forward weight shift, stepping sequence and safety.  Pt very anxious and wanting to return to chair.  Following rest break, pt ambulated x 5' forward with RW with same cues/assist as above.  Tech  able to get w/c behind pt.  Pt remained in w/c with chair alarm on and all needs in reach.  RN notified of pt being in w/c for safety.   Therapy Documentation Precautions:  Precautions Precautions: Back, Fall Precaution Booklet Issued: No Precaution Comments: reviewed back precautions Required Braces or Orthoses: Spinal Brace Spinal Brace: Applied in sitting position, Lumbar corset Restrictions Weight Bearing Restrictions: No Pain: Pain Assessment Pain Score: 5     Therapy/Group: Individual Therapy  Denice Bors 04/19/2019, 3:20 PM

## 2019-04-19 NOTE — Progress Notes (Signed)
Speech Language Pathology Daily Session Note  Patient Details  Name: Tiffany Gill MRN: [id number] Date of Birth: [birth year]  Today's Date: 04/19/2019 SLP Individual Time: 1045-1130 SLP Individual Time Calculation (min): 45 min  Short Term Goals: Week 1: SLP Short Term Goal 1 (Week 1): Patient will demonstrate recall of functional information with Min A verbal cues for use of external aids. SLP Short Term Goal 2 (Week 1): Patient will self-monitor and correct errors during functional tasks with Min A verbal cues. SLP Short Term Goal 3 (Week 1): Patient will demonstrate functional problem solving for basic and familiar tasks with Min A verbal cues.  Skilled Therapeutic Interventions:  Skilled treatment session focused on cognition goals. SLP recieved pt supine in bed with husband present. SLP facilitated session by providing general question cues to recall/state back precautions. Pt able to recall with general question/Mod I ability. Additional education was provided to pt's husband on use of calendar (posted on pt's wall) to reiterate orientation information as well as amount of time left in rehab. All of pt and husband's questions answered to satisfaction. Pt left supine in bed with husband present.      Pain Pain Assessment Pain Scale: 0-10 Pain Score: 8   Therapy/Group: Individual Therapy  Koby Pickup 04/19/2019, 2:51 PM

## 2019-04-19 NOTE — Patient Care Conference (Signed)
Inpatient RehabilitationTeam Conference and Plan of Care Update Date: 04/16/2019   Time: 11:10 AM    Patient Name: Tiffany Gill      Medical Record Number: [id number]  Date of Birth: 1941/03/29 Sex: Female         Room/Bed: 4M12C/4M12C-01 Payor Info: Payor: MEDICARE / Plan: MEDICARE PART A AND B / Product Type: *No Product type* /    Admitting Diagnosis: 5. Gen Team  Other Neuro, Lumbar radiculopathy; 13-14days  Admit Date/Time:  04/14/2019  4:44 PM Admission Comments: No comment available   Primary Diagnosis:  <principal problem not specified> Principal Problem: <principal problem not specified>  Patient Active Problem List   Diagnosis Date Noted  . Anxiety state   . Moderate episode of recurrent major depressive disorder ([place])   . Spondylolisthesis of lumbar region 04/14/2019  . Spondylisthesis 04/08/2019  . Back pain   . Anxiety and depression   . Chronic pain syndrome   . Acute blood loss anemia   . Stage 3 chronic kidney disease ([place])   . Spondylolisthesis at L4-L5 level 03/17/2019    Expected Discharge Date: Expected Discharge Date: 04/29/19  Team Members Present: Physician leading conference: Dr. Alysia Penna Social Worker Present: Alfonse Alpers, LCSW Nurse Present: Mohammed Kindle, RN;Chelsea Amalia Hailey, RN PT Present: Apolinar Junes, PT OT Present: Roanna Epley, COTA PPS Coordinator present : Gunnar Fusi, SLP     Current Status/Progress Goal Weekly Team Focus  Medical   severe anxiety of falling, hx depression but no ECT x 78mo.  Pain related to lumbar surgery LLE>RLE weakness  reduce fear of falling  Pain in low back some problems with safety awareness   Bowel/Bladder   continent of b/b; LBM: 07/21  remain continent of b/b  assist with tolieting needs prn   Swallow/Nutrition/ Hydration             ADL's   bathing-min A/mod A; dressing-mod A/max A; functional tranfsers-min/mod A with RW  supervision overall  activity tolerance, BADL training, functoinal  transfers, education, safety awareness   Mobility   mod assist for bed mobility, transfers and sit<>stand, mod assist RW gait 10 ft limited by pain  supervision-CGA  pain management, balance, strength, activity tolerance   Communication             Safety/Cognition/ Behavioral Observations  Min-Mod A  Supervision  recall with use of aids, basic problem solving, awareness   Pain   c/o pain to surgical incision; prn oxycodone  pain level <4/10  assess pain level QS and prn   Skin   surgical incision on back, honeycomb dressing  remain free of new skin breakdown/infection  assess skin QS and prn    Rehab Goals Patient on target to meet rehab goals: Yes Rehab Goals Revised: none *See Care Plan and progress notes for long and short-term goals.     Barriers to Discharge  Current Status/Progress Possible Resolutions Date Resolved   Physician    Medical stability     initiating therapy  pain med adjustment      Nursing                  PT  Behavior;Inaccessible home environment  anxiety with mobility, STE and 2 level home              OT                  SLP  SW                Discharge Planning/Teaching Needs:  Pt plans to return to her home where her husband will provide the necessary level of care.  Family education to occure prior to d/c.   Team Discussion:  Pt with lumbar stenosis, requiring decompression fusion surgery and readmitted for redo of surgery.  Pt has a hx of depression with ECT at [facility].  She has not been since December and neuropsychology to see pt while she is here.  Pt has a counselor she sees regularly.  Pt with c/o pain.  Pt is mostly continent with some urgency.  She is taking oxy and norco for her pain.  She can be forgetful at times.  Pt's staples look good.  Pt has S overall OT goals..  She is min A for bed mobility and for sit to stand txs.  She is anxious and fearful, but does quite well.  She is min/mod A for bathing UB, max A LB dressing  and CGA UB dressing.  Pt walked 10' with RW at mod A, mainly limited to pain.  Pt has some baseline memory deficits.  Her anxiety impacts her functional level.  Husband wants ST to tease out what meds vs.other reasons for deficits that may be impacting cognition.    Revisions to Treatment Plan:  none    Continued Need for Acute Rehabilitation Level of Care: The patient requires daily medical management by a physician with specialized training in physical medicine and rehabilitation for the following conditions: Daily direction of a multidisciplinary physical rehabilitation program to ensure safe treatment while eliciting the highest outcome that is of practical value to the patient.: Yes Daily medical management of patient stability for increased activity during participation in an intensive rehabilitation regime.: Yes Daily analysis of laboratory values and/or radiology reports with any subsequent need for medication adjustment of medical intervention for : Neurological problems;Post surgical problems   I attest that I was present, lead the team conference, and concur with the assessment and plan of the team.Team conference was held via web/ teleconference due to [place] - 19.   Aeisha Minarik, Silvestre Mesi 04/19/2019, 12:03 AM

## 2019-04-20 NOTE — Progress Notes (Signed)
Tiffany Gill is a 78 y.o. female 06-03-1941 [id number]  Subjective: No new complaints. No new problems. Slept well. Feeling OK.  Objective: Vital signs in last 24 hours: Temp:  [97.6 F (36.4 C)-98.1 F (36.7 C)] 97.6 F (36.4 C) (07/26 0416) Pulse Rate:  [88-95] 88 (07/26 0416) Resp:  [14-19] 14 (07/26 0416) BP: (93-138)/(62-87) 138/87 (07/26 0847) SpO2:  [96 %-100 %] 96 % (07/26 0416) Weight change:  Last BM Date: 04/17/19  Intake/Output from previous day: 07/25 0701 - 07/26 0700 In: 100 [P.O.:100] Out: -  Last cbgs: CBG (last 3)  No results for input(s): GLUCAP in the last 72 hours.   Physical Exam General: No apparent distress   HEENT: not dry Lungs: Normal effort. Lungs clear to auscultation, no crackles or wheezes. Cardiovascular: Regular rate and rhythm, no edema Abdomen: S/NT/ND; BS(+) Musculoskeletal:  unchanged Neurological: No new neurological deficits Wounds: Clean Skin: clear  Aging changes Mental state: Alert, cooperative    Lab Results: BMET    Component Value Date/Time   NA 137 04/15/2019 0854   K 4.0 04/15/2019 0854   CL 105 04/15/2019 0854   CO2 21 (L) 04/15/2019 0854   GLUCOSE 83 04/15/2019 0854   BUN 13 04/15/2019 0854   CREATININE 0.97 04/15/2019 0854   CALCIUM 9.4 04/15/2019 0854   GFRNONAA 56 (L) 04/15/2019 0854   GFRAA >60 04/15/2019 0854   CBC    Component Value Date/Time   WBC 8.1 04/16/2019 0520   RBC 2.46 (L) 04/16/2019 0520   HGB 8.0 (L) 04/16/2019 0520   HCT 25.1 (L) 04/16/2019 0520   PLT 433 (H) 04/16/2019 0520   MCV 102.0 (H) 04/16/2019 0520   MCH 32.5 04/16/2019 0520   MCHC 31.9 04/16/2019 0520   RDW 15.1 04/16/2019 0520   LYMPHSABS 1.8 04/15/2019 0854   MONOABS 0.6 04/15/2019 0854   EOSABS 0.3 04/15/2019 0854   BASOSABS 0.0 04/15/2019 0854    Studies/Results: No results found.  Medications: I have reviewed the patient's current medications.  Assessment/Plan:   1.  Functional deficits and lower  extremity weakness secondary to lumbar stenosis and polyradiculopathy, status post decompression and L3- S1 fusion with subsequent, also debilitated.  Continue CIR 2.  DVT prophylaxis with Coumadin 3.  Pain management with oxycodone and Flexeril as needed.  Lidoderm patch 4.  Ego support 5.  The patient is not fully capable of making decisions on her own behalf 6.  Routine skin care and wound care 7.  Hypertension.  Continue with Cozaar and Norvasc 8.  Acute on chronic renal failure.  Continue to monitor labs 9.  Delirium-continue to monitor sleep wake cycle 10.  MDD with anxiety.  The patient was undergoing ECT at [facility] every 3 months prior to the COVID-19 epidemic.  Continue Abilify, Cymbalta, Wellbutrin, Klonopin as needed 11.  Acute blood loss anemia.  Monitor CBC 12.  Leukocytosis.  Continue to monitor    Length of stay, days: 6  Walker Kehr , MD 04/20/2019, 12:36 PM

## 2019-04-21 ENCOUNTER — Inpatient Hospital Stay (HOSPITAL_COMMUNITY): Payer: Medicare Other | Admitting: Speech Pathology

## 2019-04-21 ENCOUNTER — Inpatient Hospital Stay (HOSPITAL_COMMUNITY): Payer: Medicare Other

## 2019-04-21 DIAGNOSIS — R410 Disorientation, unspecified: Secondary | ICD-10-CM

## 2019-04-21 DIAGNOSIS — D62 Acute posthemorrhagic anemia: Secondary | ICD-10-CM

## 2019-04-21 DIAGNOSIS — F05 Delirium due to known physiological condition: Secondary | ICD-10-CM

## 2019-04-21 DIAGNOSIS — N183 Chronic kidney disease, stage 3 unspecified: Secondary | ICD-10-CM

## 2019-04-21 DIAGNOSIS — G8918 Other acute postprocedural pain: Secondary | ICD-10-CM

## 2019-04-21 DIAGNOSIS — I10 Essential (primary) hypertension: Secondary | ICD-10-CM

## 2019-04-21 LAB — CBC
HCT: 28.5 % — ABNORMAL LOW (ref 36.0–46.0)
Hemoglobin: 9.1 g/dL — ABNORMAL LOW (ref 12.0–15.0)
MCH: 32.7 pg (ref 26.0–34.0)
MCHC: 31.9 g/dL (ref 30.0–36.0)
MCV: 102.5 fL — ABNORMAL HIGH (ref 80.0–100.0)
Platelets: 447 10*3/uL — ABNORMAL HIGH (ref 150–400)
RBC: 2.78 MIL/uL — ABNORMAL LOW (ref 3.87–5.11)
RDW: 14.7 % (ref 11.5–15.5)
WBC: 8.3 10*3/uL (ref 4.0–10.5)
nRBC: 0.2 % (ref 0.0–0.2)

## 2019-04-21 LAB — BASIC METABOLIC PANEL
Anion gap: 14 (ref 5–15)
BUN: 16 mg/dL (ref 8–23)
CO2: 21 mmol/L — ABNORMAL LOW (ref 22–32)
Calcium: 9.4 mg/dL (ref 8.9–10.3)
Chloride: 106 mmol/L (ref 98–111)
Creatinine, Ser: 1.02 mg/dL — ABNORMAL HIGH (ref 0.44–1.00)
GFR calc Af Amer: >60 mL/min (ref >60)
GFR calc non Af Amer: 53 mL/min — ABNORMAL LOW (ref >60)
Glucose, Bld: 86 mg/dL (ref 70–99)
Potassium: 4.4 mmol/L (ref 3.5–5.1)
Sodium: 141 mmol/L (ref 135–145)

## 2019-04-21 LAB — OCCULT BLOOD X 1 CARD TO LAB, STOOL: Fecal Occult Bld: NEGATIVE

## 2019-04-21 MED ORDER — TRAMADOL HCL 50 MG PO TABS
50.0000 mg | ORAL_TABLET | Freq: Three times a day (TID) | ORAL | Status: DC
  Administered 2019-04-21 – 2019-04-23 (×6): 50 mg via ORAL
  Filled 2019-04-21 (×8): qty 1

## 2019-04-21 NOTE — Plan of Care (Signed)
  Problem: Consults Goal: RH SPINAL CORD INJURY PATIENT EDUCATION Description:  See Patient Education module for education specifics.  Outcome: Progressing   Problem: SCI BOWEL ELIMINATION Goal: RH STG MANAGE BOWEL WITH ASSISTANCE Description: STG Manage Bowel with min. Assistance. Outcome: Progressing Goal: RH STG SCI MANAGE BOWEL WITH MEDICATION WITH ASSISTANCE Description: STG SCI Manage bowel with medication with min assistance. Outcome: Progressing   Problem: SCI BLADDER ELIMINATION Goal: RH STG MANAGE BLADDER WITH ASSISTANCE Description: STG Manage Bladder With min. Assistance Outcome: Progressing   Problem: RH SKIN INTEGRITY Goal: RH STG SKIN FREE OF INFECTION/BREAKDOWN Description: With min. assist Outcome: Progressing Goal: RH STG MAINTAIN SKIN INTEGRITY WITH ASSISTANCE Description: STG Maintain Skin Integrity With min. Assistance. Outcome: Progressing Goal: RH STG ABLE TO PERFORM INCISION/WOUND CARE W/ASSISTANCE Description: STG Able To Perform Incision/Wound Care With min.Assistance. Outcome: Progressing   Problem: RH SAFETY Goal: RH STG ADHERE TO SAFETY PRECAUTIONS W/ASSISTANCE/DEVICE Description: STG Adhere to Safety Precautions With cues and reminder Assistance/Device. Outcome: Progressing   Problem: RH PAIN MANAGEMENT Goal: RH STG PAIN MANAGED AT OR BELOW PT'S PAIN GOAL Description: Less than 3. Outcome: Progressing   Problem: RH KNOWLEDGE DEFICIT SCI Goal: RH STG INCREASE KNOWLEDGE OF SELF CARE AFTER SCI Description: Pt. And family will be able to identify environmental hazards and follow safety precations to prevent falls at home with cues and supervision assist from significant other.  Outcome: Progressing

## 2019-04-21 NOTE — Progress Notes (Signed)
Occupational Therapy Weekly Progress Note  Patient Details  Name: Tiffany Gill MRN: [id number] Date of Birth: 03-07-1941  Beginning of progress report period: April 15, 2019 End of progress report period: April 21, 2019  Patient has met 2 of 4 short term goals.  Pt progress has been slow and inconsistent.  Pt continues to c/o increased pain with all transitional movements.  Pt exhibits increased anxiety with all transitional movements and requires max encouragement/ressurance when engaging in all BADLs and functional tranfsers.  Pt currently requires mod A for bathing and max A for LB dressing tasks.  Pt requires min A for supine>sit EOB and min/mod A for squat pivot transfers.  Pt is not currently tolerating 3+ hours of therapy and schedule modified to 15 houts/7 days.  LTG downgraded to min A overall.   Patient continues to demonstrate the following deficits: muscle weakness and acute pain, decreased cardiorespiratoy endurance and decreased standing balance, decreased postural control, decreased balance strategies and difficulty maintaining precautions and therefore will continue to benefit from skilled OT intervention to enhance overall performance with BADL and Reduce care partner burden.  Patient not progressing toward long term goals.  See goal revision..  Plan of care revisions: include downgrading goals and bringing husband in for education and support   OT Short Term Goals Week 1:  OT Short Term Goal 1 (Week 1): Pt will perform 3/3 toileting steps with mod A OT Short Term Goal 1 - Progress (Week 1): Progressing toward goal OT Short Term Goal 2 (Week 1): Pt will perform sit to stands in prep for clothing management with supervision OT Short Term Goal 2 - Progress (Week 1): Progressing toward goal OT Short Term Goal 3 (Week 1): Pt will don UB clothing with min A OT Short Term Goal 3 - Progress (Week 1): Met OT Short Term Goal 4 (Week 1): Pt will perform bed mobility with min A with cues  in prep for ADL task OT Short Term Goal 4 - Progress (Week 1): Met Week 2:  OT Short Term Goal 1 (Week 2): STG=LTG secondary to ELOS (LTG downgraded)   [name] 04/21/2019, 6:34 AM

## 2019-04-21 NOTE — Progress Notes (Signed)
Physical Therapy Session Note  Patient Details  Name: Tiffany Gill MRN: [id number] Date of Birth: 30-Jun-1941  Today's Date: 04/21/2019 PT Individual Time: 1515-1605 PT Individual Time Calculation (min): 50 min   Short Term Goals: Week 1:  PT Short Term Goal 1 (Week 1): Pt will perform bed mobility with min assist PT Short Term Goal 2 (Week 1): Pt will perform bed<>chair transfer with  min assist PT Short Term Goal 3 (Week 1): Pt will ambulated x 50 ft with RW and min assist     Skilled Therapeutic Interventions/Progress Updates:   Pt resting in bed.  Husband present.  Pt rated pain low back 3/10, premedicated.  Pt overtly anxious, with trembling limbs.  Pt educated pt and hubsand about options to use for relaxation apps, and educated pt on deep breathing techniques, with good carryover.  Pt donned pants in supine with min assist, rolling L with mod assist and mod cues to finish pulling up over hips.  She donned L sock with set up and R sock with mod assist, in sitting EOB.  PT donned bil shoes.  Supine>< sit with mod assist.  Pt unable to don without twisting.   Pt stood from raised bed to RW with mod assist, but remained standing less than 2 seconds due to posterior lean.  Pt is very fearful of falling due to multiple falls before back surgery.  Further attempts at standing up unsuccessful.  PT moved recliner close to bed and pt performed squat pivot with mod assist.    R/L ankle pumps x 10 each with cues and demo. Hamstrings and heel cords tight bil.   Attempted transfer training forward wt shift in sitting, but pt reluctant and appearing to bend back, rather than flex trunk over hips. LSO donned by PT in sitting.    Pt became more anxious as she tried to transfer back to bed.  PT introduced slide board; mod assist level transfer back to bed.  PT doffed LSO.   At end of session, pt resting in bed with alarm set and needs at hand.  Pt's husband requested that pt have 4 bed rails up  when he is not in room.  PT updated safety plan.      Therapy Documentation Precautions:  Precautions Precautions: Back, Fall Precaution Booklet Issued: No Precaution Comments: reviewed back precautions Required Braces or Orthoses: Spinal Brace Spinal Brace: Applied in sitting position, Lumbar corset Restrictions Weight Bearing Restrictions: No      Therapy/Group: Individual Therapy  Terrionna Bridwell 04/21/2019, 4:14 PM

## 2019-04-21 NOTE — Progress Notes (Addendum)
Occupational Therapy Session Note  Patient Details  Name: Tiffany Gill MRN: [id number] Date of Birth: Feb 03, 1941  Today's Date: 04/22/2019 OT Individual Time: 0930-1015 OT Individual Time Calculation (min): 45 min    Short Term Goals: Week 2:  OT Short Term Goal 1 (Week 2): STG=LTG secondary to ELOS (LTG downgraded)  Skilled Therapeutic Interventions/Progress Updates:    Pt resting in bed upon arrival with eyes closed.  Pt easily aroused.  Pt not oriented to date or day of week.  Reviewed schedule with pt. Pt continually referred to schedule during session.  Pt required max encouragement to engaged in therapy this morning, consistently stating that she couldn't "do this." Assisted with selection of clothing.  Pt required min A for supine>sit EOB in preparation for transfer to w/c.  LOS donned and pt became tearful, stating that she couldn't move to w/c and attempted to lay back in bed.  After max encouragement, pt agreed to changing her shirt while seated EOB.  Pt required min A to doff shirt but was able to don clean shirt without assistance.  Continued to encourage pt to sit in w/c but pt insistent that she couldn't and became physically resistant. Pt assisted to supine in bed with max A to lift BLE onto bed.  Pt repositioned in bed and remained in bed with bed alarm activated and all needs within reach.  RN notified of pt's request for pain medication.   Therapy Documentation Precautions:  Precautions Precautions: Back, Fall Precaution Booklet Issued: No Precaution Comments: reviewed back precautions Required Braces or Orthoses: Spinal Brace Spinal Brace: Applied in sitting position, Lumbar corset Restrictions Weight Bearing Restrictions: No   Pain: Pain Assessment Pain Scale: 0-10 Pain Score: 8  Faces Pain Scale: Hurts whole lot Pain Type: Acute pain;Surgical pain Pain Location: Back Pain Orientation: Lower;Medial Patients Stated Pain Goal: 2 Pain Intervention(s):RN  [name] notified and meds admin during therapy    Therapy/Group: Individual Therapy  [name] 04/22/2019, 10:36 AM

## 2019-04-21 NOTE — Progress Notes (Signed)
[place] PHYSICAL MEDICINE & REHABILITATION PROGRESS NOTE   Subjective/Complaints: Patient seen laying in bed this morning.  She states she slept well overnight.  She states she had a good weekend.  ROS-denies CP SOB, N/V/D  Objective:   No results found. Recent Labs    04/21/19 0759  WBC 8.3  HGB 9.1*  HCT 28.5*  PLT 447*   Recent Labs    04/21/19 0759  NA 141  K 4.4  CL 106  CO2 21*  GLUCOSE 86  BUN 16  CREATININE 1.02*  CALCIUM 9.4    Intake/Output Summary (Last 24 hours) at 04/21/2019 1443 Last data filed at 04/21/2019 0800 Gross per 24 hour  Intake 121 ml  Output -  Net 121 ml     Physical Exam: Vital Signs Blood pressure (!) 147/63, pulse 98, temperature 97.6 F (36.4 C), temperature source Oral, resp. rate 19, height 5' (1.524 m), weight 55.3 kg, SpO2 90 %. Constitutional: No distress . Vital signs reviewed. HENT: Normocephalic.  Atraumatic. Eyes: EOMI. No discharge. Cardiovascular: No JVD. Respiratory: Normal effort. GI: Non-distended. Musc: No edema or tenderness in extremities. Neurologic: Alert and oriented Motor: 4-4+/5 grossly throughout Generalized tremors noted  Assessment/Plan: 1. Functional deficits secondary to Lumbar radiculopathy with LLE weakness which require 3+ hours per day of interdisciplinary therapy in a comprehensive inpatient rehab setting.  Physiatrist is providing close team supervision and 24 hour management of active medical problems listed below.  Physiatrist and rehab team continue to assess barriers to discharge/monitor patient progress toward functional and medical goals  Care Tool:  Bathing    Body parts bathed by patient: Right arm, Left arm, Chest, Abdomen, Right upper leg, Left upper leg   Body parts bathed by helper: Front perineal area, Buttocks, Right lower leg, Left lower leg     Bathing assist Assist Level: Maximal Assistance - Patient 24 - 49%     Upper Body Dressing/Undressing Upper body  dressing   What is the patient wearing?: Pull over shirt    Upper body assist Assist Level: Supervision/Verbal cueing    Lower Body Dressing/Undressing Lower body dressing      What is the patient wearing?: Incontinence brief, Pants     Lower body assist Assist for lower body dressing: Maximal Assistance - Patient 25 - 49%     Toileting Toileting    Toileting assist Assist for toileting: Maximal Assistance - Patient 25 - 49%     Transfers Chair/bed transfer  Transfers assist     Chair/bed transfer assist level: Minimal Assistance - Patient > 75%     Locomotion Ambulation   Ambulation assist      Assist level: 2 helpers(mod A with w/c follow for safety) Assistive device: Walker-rolling Max distance: 5   Walk 10 feet activity   Assist     Assist level: Moderate Assistance - Patient - 50 - 74% Assistive device: Walker-rolling   Walk 50 feet activity   Assist Walk 50 feet with 2 turns activity did not occur: Safety/medical concerns(pain)         Walk 150 feet activity   Assist Walk 150 feet activity did not occur: Safety/medical concerns         Walk 10 feet on uneven surface  activity   Assist Walk 10 feet on uneven surfaces activity did not occur: Safety/medical concerns         Wheelchair     Assist Will patient use wheelchair at discharge?: Yes Type of Wheelchair: Manual  Wheelchair assist level: Minimal Assistance - Patient > 75%, Set up assist Max wheelchair distance: 52'    Wheelchair 50 feet with 2 turns activity    Assist        Assist Level: Minimal Assistance - Patient > 75%   Wheelchair 150 feet activity     Assist Wheelchair 150 feet activity did not occur: Safety/medical concerns          Medical Problem List and Plan: 1.Functional deficits and lower extremity weaknesssecondary to 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 Antithrombotics: -DVT/anticoagulation:Pharmaceutical:Heparin -antiplatelet therapy: N/A 3. Pain Management:Will continue oxycodone and flexeril prn -schedule oxycodone prior to AM therapies to help with tolerance as pain has been a major inhibiting factor. Pt/husband in agreement -d/ced hydrocodone and robaxin to clear medicaiton list. Continue lidocaine patch to left hip.  Appears to be relatively controlled on 7/27 with medications 4. Mood:Team to provide ego support. LCSW to follow for evaluation and support when appropriate. -antipsychotic agents: Continue Abilify. 5. Neuropsych: This patientis not fully capable of making decisions onherown behalf. 6. Skin/Wound Care:Routine pressure relief measures. 7. Fluids/Electrolytes/Nutrition:Monitor I/O.  8. HTN: Monitor BP tid--continue Cozaar and Norvasc.  Vitals:   04/21/19 0525 04/21/19 0530  BP: (!) 143/61 (!) 147/63  Pulse: 93 98  Resp: 17 19  Temp: 97.6 F (36.4 C)   SpO2: 90%    Labile on 7/27, orthostatics negative 9. Acute on chronic renal failure?:SCr was around 1.0 prior to admission.  Creatinine 1.02 on 7/27  Continue to monitor 10. Delirium: Keep lights on during the day --monitor sleep wake cycle and set schedule. Limit cognitively altering medications as much as possible 11. MDD with anxiety:Was undergoing ECT at [facility] every 3 months (prior to start of pandemic) Continue Abilify, Cymbaltaand Wellbutrin with klonopin prn for anxiety.  12. ABLA: Monitor for signs of bleeding.   Hemoglobin 9.1 on 7/27  Continue to monitor 13. Leucocytosis: Resolved   LOS: 7 days A FACE TO FACE  EVALUATION WAS PERFORMED  Ross Hefferan Lorie Phenix 04/21/2019, 2:43 PM

## 2019-04-21 NOTE — Progress Notes (Signed)
Speech Language Pathology Discharge Summary  Patient Details  Name: Tiffany Gill MRN: [id number] Date of Birth: 13-May-1941  Today's Date: 04/21/2019 SLP Individual Time: 1300-1345 SLP Individual Time Calculation (min): 45 min   Skilled Therapeutic Interventions:   Skilled treatment session focused on cognitive goals and completion of family education with the patient's husband. SLP facilitated session by providing Min A verbal and visual cues for recall of events from previous therapy sessions with use of external aids. Patient's husband present and educated on memory compensatory strategies and way to incorporate strategies at home to maximize recall and carryover as well as overall safety and functional independence. He verbalized understanding of all information. Suspect patient is at her cognitive baseline and cognition fluctuates based on fatigue and anxiety. Patient's husband agreed, therefore, patient will be discharged from skilled SLP intervention. Both verbalized understanding and agreement. Patient left upright in bed with husband present. Continue with current plan of care.   Patient has met 4 of 4 long term goals.  Patient to discharge at overall Supervision;Min level.   Reasons goals not met: N/A   Clinical Impression/Discharge Summary:  Patient has made functional gains and has met 4 of 4 LTGs this admission. Currently, patient requires supervision-Min A verbal cues to complete functional and familiar tasks safely in regards to recall with use of external aids, basic problem solving and awareness. Patient's cognitive functioning can fluctuate throughout the day due to pain and anxiety which is baseline per both the patient and her husband. Suspect patient is at her cognitive baseline, therefore, she will be discharged from skilled SLP intervention and f/u is not warranted at this time.   Care Partner:  Caregiver Able to Provide Assistance: Yes  Type of Caregiver Assistance:  Physical;Cognitive  Recommendation:  24 hour supervision/assistance      Equipment:     Reasons for discharge: Treatment goals met   Patient/Family Agrees with Progress Made and Goals Achieved: Yes    Verbon Giangregorio 04/21/2019, 3:30 PM

## 2019-04-22 ENCOUNTER — Inpatient Hospital Stay (HOSPITAL_COMMUNITY): Payer: Medicare Other

## 2019-04-22 MED ORDER — PREDNISONE 10 MG PO TABS
5.0000 mg | ORAL_TABLET | Freq: Every day | ORAL | Status: DC
  Administered 2019-04-23 – 2019-05-01 (×9): 5 mg via ORAL
  Filled 2019-04-22 (×9): qty 1

## 2019-04-22 MED ORDER — CLONAZEPAM 0.25 MG PO TBDP
0.2500 mg | ORAL_TABLET | Freq: Three times a day (TID) | ORAL | Status: DC | PRN
  Administered 2019-04-23 – 2019-04-24 (×4): 0.25 mg via ORAL
  Filled 2019-04-22 (×4): qty 1

## 2019-04-22 MED ORDER — CLONAZEPAM 0.5 MG PO TABS: 0.2500 mg | ORAL_TABLET | Freq: Three times a day (TID) | ORAL | Status: DC | PRN

## 2019-04-22 NOTE — Progress Notes (Addendum)
Physical Therapy Weekly Progress Note  Patient Details  Name: Tiffany Gill MRN: [id number] Date of Birth: 04-28-1941  Beginning of progress report period: April 15, 2019 End of progress report period: April 22, 2019  Today's Date: 04/22/2019 PT Individual Time: 1300-1345 and 1400-1415 PT Individual Time Calculation (min): 45 min and 15 min   Patient has met 0 of 3 short term goals.  She has been very limited by pain and increased anxiety this week. She has expressed a extreme fear of falling that has become progressively worse during sessions. She is slowly progressing towards her goals, performing min A for bed mobility and transfers intermittently, but mod A more consistently. She has also performed ambulation up to 10 feet with a RW with min-mod A. PT has initiated some hands on family education, but again limited by patient's anxiety about falling.  Patient continues to demonstrate the following deficits muscle weakness and muscle joint tightness, decreased cardiorespiratoy endurance, impaired timing and sequencing, motor apraxia, decreased coordination and decreased motor planning and decreased sitting balance, decreased standing balance, decreased postural control, decreased balance strategies and difficulty maintaining precautions and therefore will continue to benefit from skilled PT intervention to increase functional independence with mobility.  Patient progressing toward long term goals..  Plan of care revisions: Reduced longer term goals to min A overall, please see Care Plan for details. .  PT Short Term Goals Week 1:  PT Short Term Goal 1 (Week 1): Pt will perform bed mobility with min assist PT Short Term Goal 1 - Progress (Week 1): Progressing toward goal PT Short Term Goal 2 (Week 1): Pt will perform bed<>chair transfer with  min assist PT Short Term Goal 2 - Progress (Week 1): Progressing toward goal PT Short Term Goal 3 (Week 1): Pt will ambulated x 50 ft with RW and min  assist PT Short Term Goal 3 - Progress (Week 1): Not met Week 2:  PT Short Term Goal 1 (Week 2): Patient will perform bed mobility with min A. PT Short Term Goal 2 (Week 2): Patient will perform basic transfers with min A using LRAD. PT Short Term Goal 3 (Week 2): Patient will propel w/c >30 feet. PT Short Term Goal 4 (Week 2): Patient will re-initiate gait training >10 feet using LRAD.  Skilled Therapeutic Interventions/Progress Updates:     Session 1: [phone] Patient in bed with her husband at bedside upon PT arrival. Patient alert and agreeable to PT session. She requested to use the [facility] at beginning of session.  Therapeutic Activity: Bed Mobility: Patient performed supine to/from sit 2 with mod A from her husband on first trial and from PT on second trial. Provided verbal cues for log roll technique and proper body mechanics for the husband when assisting. Patient became very anxious sitting EOB after first attempt, UE and LE shaking. PT provided cues for diaphragmatic breathing which resolved some anxiety momentarily, but she then returned to shaking and cried out that she had to lie down and was assist back to lying in the bed. PT provided relaxation techniques and comforted patient lying in the bed and patient appeared more relaxed and agreed to attempt sitting EOB again. On the second trial she was able to sit EOB and have her husband don her corset brace. She performed rolling L and R x3 with min A initially then mod A secondary to fear of falling off the bed, despite being in the center of the bed with PT and railing blocker her  from the edge of the bed. Patient was incontinent of bladder and required total A for peri-care and LB dressing to don a new brief and paints and remove corset brace in supine.  Transfers: Patient performed sit to/from stand x1 in an attempt to perform a stand pivot transfer to the toilet with B HHA from PT with mod A. In standing, the patient cried out again  grabbing onto PT and requiring PT to assist her back to sitting EOB. Asked patient if she cried out due to pain or fear of falling and she stated that she is afraid she will fall and she asked to lie back down as she still felt like she was going to fall. PT assisted her back to lying, see mobility details above. Patient asked to take a break and lie down for a while in the bed.    Patient's husband reported that he had never seen her anxiety this bad and had questions about her medications. PT deferred to nursing and medical staff for questions about medications and informed the husband that this therapist would discuss the patient's anxiety with her rehab team while the patient took a break to relax.   Patient in bed with her husband at bedside at end of session with breaks locked, bed alarm set, and all needs within reach.   Patient missed 15 minutes of skilled PT due to increased anxiety/fatigue.  Session 2: [id number] Patient in bed with husband at bedside, appearing more relaxed upon PT arrival. Patient alert and agreeable to PT session, stating she would like to try getting up to the w/c. Informed the patient and her husband that this therapist had spoke with Tiffany Patella, PA and Tiffany Gill, [place] about patient's anxiety. Per CSW, patient is to see the Neuropsychologist tomorrow. The patient and her husband seems pleased to her this.  Therapeutic Activity: Bed Mobility: Patient performed supine to/from sit with mod A and PT donned corset brace in sitting. Provided verbal cues for log roll technique to maintain spinal precautions. Transfers: Patient performed squat pivot transfer with mod A from bed to w/c, required 2 attempts due to patient's anxiety about falling during transfer. PT comforted patient and provided education on technique and safe performance of the transfer prior to second attempt that resulted in a successful transfer to the w/c. Provided verbal cues for hand placement and proper technique to  maintain spinal precautions.  Patient in w/c with a pillow behind her back for comfort and her husband in the room at end of session with breaks locked, seat belt alarm set, and all needs within reach.    Therapy Documentation Precautions:  Precautions Precautions: Back, Fall Precaution Booklet Issued: No Precaution Comments: reviewed back precautions Required Braces or Orthoses: Spinal Brace Spinal Brace: Applied in sitting position, Lumbar corset Restrictions Weight Bearing Restrictions: No General: PT Amount of Missed Time (min): 15 Minutes PT Missed Treatment Reason: Other (Comment)(increased anxiety) Pain: Pain Assessment Pain Score: 2/10 Pain location: back Pain interventions: repositioning; distraction; rest   Therapy/Group: Individual Therapy  Climmie Buelow L Rhealyn Cullen PT, DPT  04/22/2019, 3:47 PM

## 2019-04-22 NOTE — Plan of Care (Signed)
  Problem: RH Balance Goal: LTG Patient will maintain dynamic standing with ADLs (OT) Description: LTG:  Patient will maintain dynamic standing balance with assist during activities of daily living (OT)  Flowsheets (Taken 04/22/2019 1356) LTG: Pt will maintain dynamic standing balance during ADLs with: (downgraded JLS) Minimal Assistance - Patient > 75% Note: Downgraded due to anxiousness with activity JLS   Problem: Sit to Stand Goal: LTG:  Patient will perform sit to stand in prep for activites of daily living with assistance level (OT) Description: LTG:  Patient will perform sit to stand in prep for activites of daily living with assistance level (OT) Flowsheets (Taken 04/22/2019 1356) LTG: PT will perform sit to stand in prep for activites of daily living with assistance level: (downgraded JLS) Minimal Assistance - Patient > 75% Note: Downgraded due to anxiousness with activity JLS   Problem: RH Bathing Goal: LTG Patient will bathe all body parts with assist levels (OT) Description: LTG: Patient will bathe all body parts with assist levels (OT) Flowsheets (Taken 04/22/2019 1356) LTG: Pt will perform bathing with assistance level/cueing: (downgraded) Minimal Assistance - Patient > 75% Note: Downgraded due to anxiousness with activity JLS   Problem: RH Dressing Goal: LTG Patient will perform lower body dressing w/assist (OT) Description: LTG: Patient will perform lower body dressing with assist, with/without cues in positioning using equipment (OT) Flowsheets (Taken 04/22/2019 1356) LTG: Pt will perform lower body dressing with assistance level of: (downgraded JLS) Moderate Assistance - Patient 50 - 74% Note: Downgraded due to anxiousness with activity JLS   Problem: RH Toileting Goal: LTG Patient will perform toileting task (3/3 steps) with assistance level (OT) Description: LTG: Patient will perform toileting task (3/3 steps) with assistance level (OT)  Flowsheets (Taken 04/22/2019  1356) LTG: Pt will perform toileting task (3/3 steps) with assistance level: (downgradedJLS) Moderate Assistance - Patient 50 - 74% Note: Downgraded due to anxiousness with activity JLS   Problem: RH Toilet Transfers Goal: LTG Patient will perform toilet transfers w/assist (OT) Description: LTG: Patient will perform toilet transfers with assist, with/without cues using equipment (OT) Flowsheets (Taken 04/22/2019 1356) LTG: Pt will perform toilet transfers with assistance level of: (downgraded JLS) Minimal Assistance - Patient > 75% Note: Downgraded due to anxiousness with activity JLS

## 2019-04-22 NOTE — Plan of Care (Signed)
Problem: RH Balance Goal: LTG Patient will maintain dynamic standing balance (PT) Description: LTG:  Patient will maintain dynamic standing balance with assistance during mobility activities (PT) Flowsheets (Taken 04/22/2019 1548) LTG: Pt will maintain dynamic standing balance during mobility activities with:: (goal downgraded) Minimal Assistance - Patient > 75% Note: [place] downgraded due to slow progress, limited by pain and anxiety.    Problem: Sit to Stand Goal: LTG:  Patient will perform sit to stand with assistance level (PT) Description: LTG:  Patient will perform sit to stand with assistance level (PT) Flowsheets (Taken 04/22/2019 1548) LTG: PT will perform sit to stand in preparation for functional mobility with assistance level: (downgraded goal) Minimal Assistance - Patient > 75% Note: Gaol downgraded due to slow progress, limited by pain and anxiety.    Problem: RH Bed Mobility Goal: LTG Patient will perform bed mobility with assist (PT) Description: LTG: Patient will perform bed mobility with assistance, with/without cues (PT). Flowsheets (Taken 04/22/2019 1548) LTG: Pt will perform bed mobility with assistance level of: (goal downgraded) Minimal Assistance - Patient > 75% Note: Gaol downgraded due to slow progress, limited by pain and anxiety.    Problem: RH Bed to Chair Transfers Goal: LTG Patient will perform bed/chair transfers w/assist (PT) Description: LTG: Patient will perform bed to chair transfers with assistance (PT). Flowsheets (Taken 04/22/2019 1548) LTG: Pt will perform Bed to Chair Transfers with assistance level: (goal downgraded) Minimal Assistance - Patient > 75% Note: Gaol downgraded due to slow progress, limited by pain and anxiety.    Problem: RH Car Transfers Goal: LTG Patient will perform car transfers with assist (PT) Description: LTG: Patient will perform car transfers with assistance (PT). Flowsheets (Taken 04/22/2019 1548) LTG: Pt will perform car  transfers with assist:: (goal downgraded) Minimal Assistance - Patient > 75% Note: Gaol downgraded due to slow progress, limited by pain and anxiety.    Problem: RH Ambulation Goal: LTG Patient will ambulate in controlled environment (PT) Description: LTG: Patient will ambulate in a controlled environment, # of feet with assistance (PT). Flowsheets (Taken 04/22/2019 1548) LTG: Pt will ambulate in controlled environ  assist needed:: (goal downgraded) Minimal Assistance - Patient > 75% LTG: Ambulation distance in controlled environment: 25 ft using LRAD Note: Gaol downgraded due to slow progress, limited by pain and anxiety.  Goal: LTG Patient will ambulate in home environment (PT) Description: LTG: Patient will ambulate in home environment, # of feet with assistance (PT). Flowsheets (Taken 04/22/2019 1548) LTG: Pt will ambulate in home environ  assist needed:: (goal downgraded) Minimal Assistance - Patient > 75% LTG: Ambulation distance in home environment: 25 ft using LRAD Note: Gaol downgraded due to slow progress, limited by pain and anxiety.    Problem: RH Wheelchair Mobility Goal: LTG Patient will propel w/c in controlled environment (PT) Description: LTG: Patient will propel wheelchair in controlled environment, # of feet with assist (PT) Flowsheets (Taken 04/15/2019 1013 by Enid Skeens, Raquel Sarna, PT) LTG: Pt will propel w/c in controlled environ  assist needed:: Supervision/Verbal cueing LTG: Propel w/c distance in controlled environment: 50 ft   Problem: RH Stairs Goal: LTG Patient will ambulate up and down stairs w/assist (PT) Description: LTG: Patient will ambulate up and down # of stairs with assistance (PT) Flowsheets Taken 04/22/2019 1548 by Jago Carton L, PT LTG: Pt will ambulate up/down stairs assist needed:: Moderate Assistance - Patient 50 - 74% Taken 04/15/2019 1251 by Enid Skeens, Raquel Sarna, PT LTG: Pt will  ambulate up and down number of stairs:  4 steps with rails

## 2019-04-22 NOTE — Progress Notes (Signed)
[place] PHYSICAL MEDICINE & REHABILITATION PROGRESS NOTE  Subjective/Complaints: Patient seen laying in bed this morning.  She states she slept fairly overnight.  She is noted to be shaking upon my arrival this morning however nearly resolved with deep breathing exercises with patient.  ROS: Denies CP SOB, N/V/D  Objective:   No results found. Recent Labs    04/21/19 0759  WBC 8.3  HGB 9.1*  HCT 28.5*  PLT 447*   Recent Labs    04/21/19 0759  NA 141  K 4.4  CL 106  CO2 21*  GLUCOSE 86  BUN 16  CREATININE 1.02*  CALCIUM 9.4    Intake/Output Summary (Last 24 hours) at 04/22/2019 1016 Last data filed at 04/21/2019 1817 Gross per 24 hour  Intake 240 ml  Output -  Net 240 ml     Physical Exam: Vital Signs Blood pressure 133/74, pulse 85, temperature 98.1 F (36.7 C), temperature source Oral, resp. rate 18, height 5' (1.524 m), weight 55.3 kg, SpO2 94 %. Constitutional: No distress . Vital signs reviewed. HENT: Normocephalic.  Atraumatic. Eyes: EOMI.  No discharge. Cardiovascular: No JVD. Respiratory: Normal effort. GI: Non-distended. Musc: No edema or tenderness in extremities. Neurologic: Alert and oriented Motor: 4-4+/5 grossly throughout Generalized tremors noted  Assessment/Plan: 1. Functional deficits secondary to Lumbar radiculopathy with LLE weakness which require 3+ hours per day of interdisciplinary therapy in a comprehensive inpatient rehab setting.  Physiatrist is providing close team supervision and 24 hour management of active medical problems listed below.  Physiatrist and rehab team continue to assess barriers to discharge/monitor patient progress toward functional and medical goals  Care Tool:  Bathing    Body parts bathed by patient: Right arm, Left arm, Chest, Abdomen, Right upper leg, Left upper leg   Body parts bathed by helper: Front perineal area, Buttocks, Right lower leg, Left lower leg     Bathing assist Assist Level: Maximal  Assistance - Patient 24 - 49%     Upper Body Dressing/Undressing Upper body dressing   What is the patient wearing?: Pull over shirt    Upper body assist Assist Level: Supervision/Verbal cueing    Lower Body Dressing/Undressing Lower body dressing      What is the patient wearing?: Incontinence brief, Pants     Lower body assist Assist for lower body dressing: Maximal Assistance - Patient 25 - 49%     Toileting Toileting    Toileting assist Assist for toileting: Maximal Assistance - Patient 25 - 49%     Transfers Chair/bed transfer  Transfers assist     Chair/bed transfer assist level: Moderate Assistance - Patient 50 - 74%     Locomotion Ambulation   Ambulation assist      Assist level: 2 helpers(mod A with w/c follow for safety) Assistive device: Walker-rolling Max distance: 5   Walk 10 feet activity   Assist     Assist level: Moderate Assistance - Patient - 50 - 74% Assistive device: Walker-rolling   Walk 50 feet activity   Assist Walk 50 feet with 2 turns activity did not occur: Safety/medical concerns(pain)         Walk 150 feet activity   Assist Walk 150 feet activity did not occur: Safety/medical concerns         Walk 10 feet on uneven surface  activity   Assist Walk 10 feet on uneven surfaces activity did not occur: Safety/medical concerns         Wheelchair  Assist Will patient use wheelchair at discharge?: Yes Type of Wheelchair: Manual    Wheelchair assist level: Minimal Assistance - Patient > 75%, Set up assist Max wheelchair distance: 4'    Wheelchair 50 feet with 2 turns activity    Assist        Assist Level: Minimal Assistance - Patient > 75%   Wheelchair 150 feet activity     Assist Wheelchair 150 feet activity did not occur: Safety/medical concerns          Medical Problem List and Plan: 1.Functional deficits and lower extremity weaknesssecondary to lumbar stenosis and  poly-radiculopathy s/p decompression and L3-S1 fusion with subsequent re-do, also debilitated  Continue CIR 2. Antithrombotics: -DVT/anticoagulation:Pharmaceutical:Heparin -antiplatelet therapy: N/A 3. Pain Management:Will continue oxycodone and flexeril prn -schedule oxycodone prior to AM therapies to help with tolerance as pain has been a major inhibiting factor. Pt/husband in agreement -d/ced hydrocodone and robaxin to clear medicaiton list. Continue lidocaine patch to left hip.  Appears to be relatively controlled on 7/28 with medications 4. Mood:Team to provide ego support. LCSW to follow for evaluation and support when appropriate. -antipsychotic agents: Continue Abilify. 5. Neuropsych: This patientis not fully capable of making decisions onherown behalf. 6. Skin/Wound Care:Routine pressure relief measures. 7. Fluids/Electrolytes/Nutrition:Monitor I/O.  8. HTN: Monitor BP tid--continue Cozaar and Norvasc.  Vitals:   04/22/19 0519 04/22/19 0521  BP: 130/65 133/74  Pulse: 81 85  Resp: 18   Temp: 98.1 F (36.7 C)   SpO2: 94%    Controlled on 7/28, orthostatics negative on 7/28 9.  Likely CKD:SCr was around 1.0 prior to admission.  Creatinine 1.02 on 7/27  Continue to monitor 10. Delirium: Keep lights on during the day --monitor sleep wake cycle and set schedule. Limit cognitively altering medications as much as possible 11. MDD with anxiety:Was undergoing ECT at [facility] every 3 months (prior to start of pandemic) Continue Abilify, Cymbaltaand Wellbutrin with klonopin prn for anxiety.   See #4  Team to provide support with reassurance  12. ABLA: Monitor for signs of bleeding.   Hemoglobin 9.1 on 7/27  Hemoccult negative x2  Continue to monitor 13. Leucocytosis: Resolved   LOS: 8 days A FACE TO FACE EVALUATION WAS PERFORMED  Tiffany Gill Lorie Phenix 04/22/2019, 10:16 AM

## 2019-04-22 NOTE — Progress Notes (Signed)
Occupational Therapy Session Note  Patient Details  Name: Tiffany Gill MRN: [id number] Date of Birth: [birth year]  Today's Date: 04/22/2019 OT Individual Time: 0930-1000 OT Individual Time Calculation (min): 30 min  and Today's Date: 04/22/2019 OT Missed Time: 27 Minutes Missed Time Reason: Pain;Patient fatigue;Other (comment)(anxiety)   Short Term Goals: Week 2:  OT Short Term Goal 1 (Week 2): STG=LTG secondary to ELOS (LTG downgraded)  Skilled Therapeutic Interventions/Progress Updates:    Pt resting in bed, partially covered with blanket, upon arrival.  Pt states that her RLE is hurting this morning.  Pt states this has "happened before." Pt with increased anxiety this morning with BUE tremors.  Pt states she is not cold but pulls blanket up over chest when offered blanket. Attempted to engage pt in bed mobility in preparation for sitting EOB but pt resistant and becomes more anxious.  Informed pt that I would give her a break but on return pt was asleep.  RN [name] of RLE pain.  Meds admin prior to therapy.  Pt missed 45 mins skilled OT services 2/2 pain, fatigue, and increased anxiety with all transitional movements. Discussed with CSW who will contact husband to arrange for his presence with future therapies.   Therapy Documentation Precautions:  Precautions Precautions: Back, Fall Precaution Booklet Issued: No Precaution Comments: reviewed back precautions Required Braces or Orthoses: Spinal Brace Spinal Brace: Applied in sitting position, Lumbar corset Restrictions Weight Bearing Restrictions: No General: General OT Amount of Missed Time: 45 Minutes Pain:  Pt c/o RLE (thigh) pain since earlier in morning (unrated); emotional support, meds admin prior to therapy, repositioned   Therapy/Group: Individual Therapy  [name] 04/22/2019, 10:25 AM

## 2019-04-23 ENCOUNTER — Encounter (HOSPITAL_COMMUNITY): Payer: Medicare Other | Admitting: Psychology

## 2019-04-23 ENCOUNTER — Inpatient Hospital Stay (HOSPITAL_COMMUNITY): Payer: Medicare Other

## 2019-04-23 LAB — URINALYSIS, ROUTINE W REFLEX MICROSCOPIC
Bilirubin Urine: NEGATIVE
Glucose, UA: NEGATIVE mg/dL
Ketones, ur: 20 mg/dL — AB
Nitrite: NEGATIVE
Protein, ur: NEGATIVE mg/dL
Specific Gravity, Urine: 1.014 (ref 1.005–1.030)
pH: 7 (ref 5.0–8.0)

## 2019-04-23 NOTE — Progress Notes (Signed)
Pt had order for orthostatic VS. The tech tried to assist the pt with sitting up on the side of the bed, pt was getting upset and yell how much it hurt. Once in the sitting position, pt was able to hold the position w/o this nurse holding her. Pt was unable to stand. This nurse and nurse tech assisted the pt to standing position with max assist, pt was unable to stand with out support from staff.

## 2019-04-23 NOTE — Progress Notes (Signed)
Attempted to discuss issues regarding husband's impression of patient's pain as well as anxiety that has been reported by therapy. He has been declining klonopin for patient and relayed to him that anxiety has been an issue and this likely exacerbates her pain. He did relay that she was sedentary at home and is now doing more than at baseline. Heard patient asking him to let her have klonopin and he is willing to allow the lower dose.

## 2019-04-23 NOTE — Progress Notes (Signed)
Occupational Therapy Session Note  Patient Details  Name: Tiffany Gill MRN: [id number] Date of Birth: 1940/10/21  Today's Date: 04/23/2019 OT Individual Time: 1100-1200 OT Individual Time Calculation (min): 60 min    Short Term Goals: Week 2:  OT Short Term Goal 1 (Week 2): STG=LTG secondary to ELOS (LTG downgraded)  Skilled Therapeutic Interventions/Progress Updates:    Pt resting in bed upon arrival with husband present.  OT intervention with focus on family education, bed mobility, sit<>stand, standing balance, functional transfers, dressing tasks, relaxation techniques. Pt sat EOB with supervision but quickly became anxious in anticipation of transfer to w/c.  Back brace donned and pt required max A for squat pivot transfer (pt afraid to attempt and stated "just do it."  Pt stood at sink X 3 with min A for LB clothing management.  Pt required extra time to regulate breathing after standing each time.  Pt became more anxious as session progressed and transferred back to bed to don pants.  Continued education with pt and husband.  Husband reports that he was providing very little assistance prior to this hospitalization.  Pt requested to remain in bed with all needs within reach and husband present.   Therapy Documentation Precautions:  Precautions Precautions: Back, Fall Precaution Booklet Issued: No Precaution Comments: reviewed back precautions Required Braces or Orthoses: Spinal Brace Spinal Brace: Applied in sitting position, Lumbar corset Restrictions Weight Bearing Restrictions: No   Pain:  Pt denies pain but grimaces with transitional movements   Therapy/Group: Individual Therapy  [name] 04/23/2019, 12:27 PM

## 2019-04-23 NOTE — Progress Notes (Signed)
RN went in to give heparin SQ and husband refused. Stated he wanted to wait and talk to Dr. Posey Pronto or Dr. Sima Matas regarding medication and SE. Explained importance of heparin dose esp where pt not ambulatory but husband wants to wait to have talk before giving any more doses. Will notify MD/PA.  Erie Noe, RN

## 2019-04-23 NOTE — Progress Notes (Signed)
[place] PHYSICAL MEDICINE & REHABILITATION PROGRESS NOTE  Subjective/Complaints: Patient seen resting comfortably in bed upon arrival.  She is easy to arouse and states that she slept well overnight.  She denies complaints.  ROS: Denies CP SOB, N/V/D  Objective:   No results found. Recent Labs    04/21/19 0759  WBC 8.3  HGB 9.1*  HCT 28.5*  PLT 447*   Recent Labs    04/21/19 0759  NA 141  K 4.4  CL 106  CO2 21*  GLUCOSE 86  BUN 16  CREATININE 1.02*  CALCIUM 9.4    Intake/Output Summary (Last 24 hours) at 04/23/2019 1123 Last data filed at 04/22/2019 1200 Gross per 24 hour  Intake 120 ml  Output -  Net 120 ml     Physical Exam: Vital Signs Blood pressure 123/69, pulse 83, temperature 98.2 F (36.8 C), temperature source Oral, resp. rate 14, height 5' (1.524 m), weight 53.1 kg, SpO2 99 %. Constitutional: No distress . Vital signs reviewed. HENT: Normocephalic.  Atraumatic. Eyes: EOMI.  No discharge. Cardiovascular: No JVD. Respiratory: Normal effort. GI: Non-distended. Musc: No edema or tenderness in extremities. Neurologic: Alert and oriented Motor: 4-4+/5 grossly throughout, unchanged Psych: Anxious  Assessment/Plan: 1. Functional deficits secondary to Lumbar radiculopathy with LLE weakness which require 3+ hours per day of interdisciplinary therapy in a comprehensive inpatient rehab setting.  Physiatrist is providing close team supervision and 24 hour management of active medical problems listed below.  Physiatrist and rehab team continue to assess barriers to discharge/monitor patient progress toward functional and medical goals  Care Tool:  Bathing    Body parts bathed by patient: Right arm, Left arm, Chest, Abdomen, Right upper leg, Left upper leg   Body parts bathed by helper: Front perineal area, Buttocks, Right lower leg, Left lower leg     Bathing assist Assist Level: Maximal Assistance - Patient 24 - 49%     Upper Body  Dressing/Undressing Upper body dressing   What is the patient wearing?: Pull over shirt    Upper body assist Assist Level: Supervision/Verbal cueing    Lower Body Dressing/Undressing Lower body dressing      What is the patient wearing?: Incontinence brief, Pants     Lower body assist Assist for lower body dressing: Maximal Assistance - Patient 25 - 49%     Toileting Toileting    Toileting assist Assist for toileting: Maximal Assistance - Patient 25 - 49%     Transfers Chair/bed transfer  Transfers assist     Chair/bed transfer assist level: Moderate Assistance - Patient 50 - 74%     Locomotion Ambulation   Ambulation assist      Assist level: 2 helpers(mod A with w/c follow for safety) Assistive device: Walker-rolling Max distance: 5   Walk 10 feet activity   Assist     Assist level: Moderate Assistance - Patient - 50 - 74% Assistive device: Walker-rolling   Walk 50 feet activity   Assist Walk 50 feet with 2 turns activity did not occur: Safety/medical concerns(pain)         Walk 150 feet activity   Assist Walk 150 feet activity did not occur: Safety/medical concerns         Walk 10 feet on uneven surface  activity   Assist Walk 10 feet on uneven surfaces activity did not occur: Safety/medical concerns         Wheelchair     Assist Will patient use wheelchair at discharge?: Yes Type  of Wheelchair: Manual    Wheelchair assist level: Minimal Assistance - Patient > 75%, Set up assist Max wheelchair distance: 30'    Wheelchair 50 feet with 2 turns activity    Assist        Assist Level: Minimal Assistance - Patient > 75%   Wheelchair 150 feet activity     Assist Wheelchair 150 feet activity did not occur: Safety/medical concerns          Medical Problem List and Plan: 1.Functional deficits and lower extremity weaknesssecondary to lumbar stenosis and poly-radiculopathy s/p decompression and L3-S1  fusion with subsequent re-do, also debilitated  Continue CIR  Team conference today to discuss current and goals and coordination of care, home and environmental barriers, and discharge planning with nursing, case manager, and therapies.  2. Antithrombotics: -DVT/anticoagulation:Pharmaceutical:Heparin -antiplatelet therapy: N/A 3. Pain Management:Will continue oxycodone Oxycodone PRN  Continue lidocaine patch to left hip.  Appears to be relatively controlled on 7/29 with medications 4. Mood:Team to provide ego support. LCSW to follow for evaluation and support when appropriate. -antipsychotic agents: Continue Abilify. 5. Neuropsych: This patientis not fully capable of making decisions onherown behalf. 6. Skin/Wound Care:Routine pressure relief measures. 7. Fluids/Electrolytes/Nutrition:Monitor I/O.  8. HTN: Monitor BP tid--continue Cozaar and Norvasc.  Vitals:   04/22/19 1949 04/23/19 0519  BP: 126/66 123/69  Pulse: 87 83  Resp: 17 14  Temp: (!) 97.5 F (36.4 C) 98.2 F (36.8 C)  SpO2: 96% 99%   Stable with negative orthostatics on 7/29 9.  Likely CKD:SCr was around 1.0 prior to admission.  Creatinine 1.02 on 7/27  Continue to monitor 10. Delirium: Keep lights on during the day --monitor sleep wake cycle and set schedule. Limit cognitively altering medications as much as possible 11. MDD with anxiety:Was undergoing ECT at [facility] every 3 months (prior to start of pandemic) Continue Abilify, Cymbaltaand Wellbutrin  Klonopin dose decreased and frequency increased on 7/29  See #4  Team to provide support with reassurance  12. ABLA: Monitor for signs of bleeding.   Hemoglobin 9.1 on 7/27  Hemoccult negative x2  Continue to monitor 13. Leucocytosis: Resolved   LOS: 9 days A FACE TO FACE EVALUATION WAS PERFORMED  Leroi Haque Lorie Phenix 04/23/2019, 11:23 AM

## 2019-04-23 NOTE — Progress Notes (Signed)
Physical Therapy Session Note  Patient Details  Name: Tiffany Gill MRN: [id number] Date of Birth: December 09, 1940  Today's Date: 04/23/2019 PT Individual Time: 4098-1191 PT Individual Time Calculation (min): 58 min   Short Term Goals:  Week 2:  PT Short Term Goal 1 (Week 2): Patient will perform bed mobility with min A. PT Short Term Goal 2 (Week 2): Patient will perform basic transfers with min A using LRAD. PT Short Term Goal 3 (Week 2): Patient will propel w/c >30 feet. PT Short Term Goal 4 (Week 2): Patient will re-initiate gait training >10 feet using LRAD.  Skilled Therapeutic Interventions/Progress Updates:  Pt resting in bed, with bed set with  bil LEs as high as upper body.  She was visibly trembling/twitching x 4 extremities and trunk and appeared to be having hallucinations, speaking out randomly.  Husband present.  He stated that pt was hallucinating due to Tramadol.    Attempted to place bed flat, but pt cried out in pain.  Pt rolled R/L with log rolling, CGA and mod cues, and PT able to lower foot of bed as she rolled.    Therapeutic exercises performed with LEs to increase strength for functional mobility: R/L side lying 10 x 1 active assistive:  L/R clam shell abduction, L/R hip extension with flexed knee, L/R knee extension; supine 2 x 10 bil bridging and cervical flexion;  1 x 10 R/L scapular protraction, 15 x 1 bil elbow flexion/extension.  Pt demonstrated DOE with q bout of exs.  PT reviewed diaphragmatic breathing , and with mod cues after each set, pt able to slow breathing and take deeper breaths.  PT raised HOB and encouraged pt to drink a few sips of water.  Pt has a strong malodorous urine odor.  PT informed Kayla, LPN.   At end of session, pt resting in bed with alarm set, and husband present.       Therapy Documentation Precautions:  Precautions Precautions: Back, Fall Precaution Booklet Issued: No Precaution Comments: reviewed back precautions Required  Braces or Orthoses: Spinal Brace Spinal Brace: Applied in sitting position, Lumbar corset Restrictions Weight Bearing Restrictions: No Therapy Vitals Temp: 97.9 F (36.6 C) Pulse Rate: 86 Resp: 18 BP: (!) 121/57 Patient Position (if appropriate): Lying Oxygen Therapy SpO2: 97 % O2 Device: Room Air Pain:  2/10 surgical; premedicated         Therapy/Group: Individual Therapy  Ziyad Dyar 04/23/2019, 4:45 PM

## 2019-04-23 NOTE — Plan of Care (Signed)
  Problem: Consults Goal: RH SPINAL CORD INJURY PATIENT EDUCATION Description:  See Patient Education module for education specifics.  Outcome: Progressing   Problem: SCI BOWEL ELIMINATION Goal: RH STG MANAGE BOWEL WITH ASSISTANCE Description: STG Manage Bowel with min. Assistance. Outcome: Progressing Goal: RH STG SCI MANAGE BOWEL WITH MEDICATION WITH ASSISTANCE Description: STG SCI Manage bowel with medication with min assistance. Outcome: Progressing   Problem: SCI BLADDER ELIMINATION Goal: RH STG MANAGE BLADDER WITH ASSISTANCE Description: STG Manage Bladder With min. Assistance Outcome: Progressing   Problem: RH SKIN INTEGRITY Goal: RH STG SKIN FREE OF INFECTION/BREAKDOWN Description: With min. assist Outcome: Progressing Goal: RH STG MAINTAIN SKIN INTEGRITY WITH ASSISTANCE Description: STG Maintain Skin Integrity With min. Assistance. Outcome: Progressing Goal: RH STG ABLE TO PERFORM INCISION/WOUND CARE W/ASSISTANCE Description: STG Able To Perform Incision/Wound Care With min.Assistance. Outcome: Progressing   Problem: RH PAIN MANAGEMENT Goal: RH STG PAIN MANAGED AT OR BELOW PT'S PAIN GOAL Description: Less than 3. Outcome: Progressing   Problem: RH KNOWLEDGE DEFICIT SCI Goal: RH STG INCREASE KNOWLEDGE OF SELF CARE AFTER SCI Description: Pt. And family will be able to identify environmental hazards and follow safety precations to prevent falls at home with cues and supervision assist from significant other.  Outcome: Progressing

## 2019-04-23 NOTE — Consult Note (Signed)
Neuropsychological Consultation   Patient:   Tiffany Gill   DOB:   1940/12/24  MR Number:  [id number]  Location:  [facility] [address] C[facility] [address] [place] [id number] [place] [address] Dept: [place]: [phone]           Date of Service:   04/23/2019  Start Time:   10 AM End Time:   11 AM  Provider/Observer:  Ilean Skill, Psy.D.       Clinical Neuropsychologist       Billing Code/Service: [phone], [phone]  Chief Complaint:    Morgan Keinath is a 78 year old female with history of HTN, MDD, memory loss, LBP due to spondylolisthesis with facet arthropathy and severe spinal stenosis affecting L4 and L5 nerve roots.  Patient underwent decompressive laminectomy with fusion on 03/17/2019.  Patient had worsening of pain and admitted on 6/29 for workup and pain management.  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  Patient had fall on 7/4 stricking head with occipital laceration.  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  Patient continued to have issues with delirium and pain and follow-up MRI on 7/8 showed severe changes in L area and patient returned to OR for re-exploration and removal of residual spinous process and redo screw fixation.  Great improvement in pain post op.    Patient has long history of severe recurrent depression with recurring ECT at [facility].  Her last ECT according to my review of med records was Feb 2020.   Patient reports that she been through many psychotropic trials in past with varying levels of success.  04/23/2019: Today was a follow-up visit with the patient.  The patient was also present in the room with her husband as well.  The patient described it appeared to be displaying increased symptoms of anxiety since I saw her previously.  The patient's husband reports that he is noting more anxiety and  was concerned that it may be related to medication changes.  The patient does have a long history of significant depression anxiety that is been treated previously with ECT and the patient has not been able to have her regular ECT treatments initially due to [place] [facility] restrictions at her providers office but now due to residual effects of her recent decompressive laminectomy surgery in June.  Reason for Service:  Patient was referred for neuropsycholgocal consulation due to coping and adjustment issues and monitor improving Mental Status and cognition.  Below is the HPI for the current admission.  JME:QASTM D Naves is a 78 year old female with history of HTN, MDD, memory loss, LBP radiating to LLE>RLE due to spondylolisthesis with facet arthropathy and severe spinal stenosis affecting L4 and L5 nerve roots s/p 20 for L4-L5 decompressive laminectomy with fusion 03/17/19 who started developing progressive LBP and hip pain with follow up X rays in office revealing subsistence of spacer. She was admitted on 03/24/19 for work up and pain management. CT spine done revealing subsidence of right spacer into superior endplate of L5 but overall stable hardware. 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 She did sustained a fall striking her head on 07/04 with subsequent occipital laceration. 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 CT left hip was  negative for fracture.   She continued to have issues with delirium and buttock pain therefore hospitalist were consulted for input she was treated with fluid bolus with improvement in renal status and mentation. They recommended MRI lumbar spine which was done 04/02/19 showing severe thecal sac effacement L4/L5 due to listhesis with epidural collection at L5 and subdural collection  from L2-L4 with displacement. Head CT repeated and was negative for bleed. Medications adjusted to help with pain management but she continued to have pain and anxiety limiting overall mobility. After discussion with husband, she was taken by to OR for re-exploration of L4/L5 with removal of residual spinous process and redo foraminotomies with screw fixation L3-S1 on 04/08/19 by Dr. Saintclair Halsted. Post op, she has had great improvement in pain and mild left foot drop reported to be stable. MD recommended CIR due to functional decline.   Current Status:  The patient reports increased anxiety and worry but no significant worsening of her depressive symptoms.  The patient is having regular visits by her husband and she feels like this is helpful.  Her husband was very active in his questions today with his efforts trying to figure out some of the etiological factors that may be playing a role in her increased anxiety.  Behavioral Observation: KAYLIANNA DETERT  presents as a 78 y.o.-year-old Right Caucasian Female who appeared her stated age. her dress was Appropriate and she was Well Groomed and her manners were Appropriate to the situation.  her participation was indicative of Appropriate and Redirectable behaviors.  There were any physical disabilities noted.  she displayed an appropriate level of cooperation and motivation.     Interactions:    Active Appropriate and Redirectable  Attention:   abnormal and attention span appeared shorter than expected for age  Memory:   abnormal; remote memory intact, recent memory impaired  Visuo-spatial:  not examined  Speech (Volume):  normal  Speech:   normal; normal  Thought Process:  Coherent and Tangential  Though Content:  WNL; not suicidal and not homicidal  Orientation:   person, place, time/date and situation  Judgment:   Fair  Planning:   Fair  Affect:    Anxious  Mood:    Anxious  Insight:   Fair  Intelligence:   normal  Medical  History:   Past Medical History:  Diagnosis Date  . Anxiety   . Gait disorder   . High cholesterol   . Hypertension   . Hypothyroidism   . Major depression in partial remission ([place])   . Memory loss   . Osteoarthritis         Psychiatric History:  Patient does have past history of depression and anxiety disorder.  She reports that she is dealing with more anxiety now than depressive symptoms.    Family Med/Psych History:  Family History  Problem Relation Age of Onset  . Stroke Mother   . Heart disease Father   . Heart disease Brother    Impression/DX:  Ronnica Dreese is a 78 year old female with history of HTN, MDD, memory loss, LBP due to spondylolisthesis with facet arthropathy and severe spinal stenosis affecting L4 and L5 nerve roots.  Patient underwent decompressive laminectomy with fusion on 03/17/2019.  Patient had worsening of pain and admitted on 6/29 for workup and pain management.  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  Patient had fall on 7/4 stricking head with occipital laceration.  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  Patient continued to have issues with delirium and pain and follow-up MRI on 7/8 showed severe changes in L area and patient returned to OR for re-exploration and removal of residual spinous process and redo screw fixation.  Great improvement in pain post op.    Patient has long history of severe recurrent depression with recurring ECT at [facility].  Patient reports that she been through many psychotropic trials in past with varying levels of success.    Patient does acknowledge ongoing anxiety and worry but her mental status is improving and she was oriented with adequate comprehension and understanding of situation.   Disposition/Plan:  I reviewed issues regarding her anxiety and status with Dr. Posey Pronto.  We also worked on trying to make available a WebEx  appointment with her treating psychologist that she has been seen for quite some time here in [place].  Diagnosis:   Anxiety and long history of severe MDD with recurrent ECT at [facility].          Electronically Signed   _______________________ Ilean Skill, Psy.D.

## 2019-04-24 ENCOUNTER — Inpatient Hospital Stay (HOSPITAL_COMMUNITY): Payer: Medicare Other

## 2019-04-24 MED ORDER — MELATONIN 3 MG PO TABS
1.5000 mg | ORAL_TABLET | Freq: Every day | ORAL | Status: DC
  Administered 2019-04-24: 1.5 mg via ORAL
  Filled 2019-04-24 (×2): qty 0.5

## 2019-04-24 MED ORDER — CLONAZEPAM 0.25 MG PO TBDP
0.2500 mg | ORAL_TABLET | Freq: Two times a day (BID) | ORAL | Status: DC
  Administered 2019-04-24 – 2019-05-04 (×21): 0.25 mg via ORAL
  Filled 2019-04-24 (×22): qty 1

## 2019-04-24 MED ORDER — NON FORMULARY: 1.5000 mg | Freq: Every day | Status: DC

## 2019-04-24 NOTE — Progress Notes (Signed)
[facility] PHYSICAL MEDICINE & REHABILITATION PROGRESS NOTE  Subjective/Complaints: Patient seen laying in bed this AM.  She states she slept well overnight, however, received a call from nursing overnight regarding a fall. Discussed with nursing this AM.  She is restless AM.  Discussed with therapies. Encouraged keeping patient awake during the day.  Discussed with PA, team, and prolonged discussion with husband regarding meds.   ROS: Denies CP SOB, N/V/D  Objective:   No results found. No results for input(s): WBC, HGB, HCT, PLT in the last 72 hours. No results for input(s): NA, K, CL, CO2, GLUCOSE, BUN, CREATININE, CALCIUM in the last 72 hours.  Intake/Output Summary (Last 24 hours) at 04/24/2019 0803 Last data filed at 04/23/2019 1824 Gross per 24 hour  Intake 120 ml  Output -  Net 120 ml     Physical Exam: Vital Signs Blood pressure (!) 143/72, pulse 94, temperature 98.1 F (36.7 C), temperature source Oral, resp. rate 18, height 5' (1.524 m), weight 53.1 kg, SpO2 95 %. Constitutional: No distress . Vital signs reviewed. HENT: Normocephalic. Atraumatic. Eyes: EOMI. No discharge. Cardiovascular: No JVD. Respiratory: Normal effort. GI: Non-distended. Musc: No edema or tenderness in extremities. Neurologic: Alert and oriented Motor: 4-4+/5 grossly throughout, stable Psych: Anxious  Assessment/Plan: 1. Functional deficits secondary to Lumbar radiculopathy with LLE weakness which require 3+ hours per day of interdisciplinary therapy in a comprehensive inpatient rehab setting.  Physiatrist is providing close team supervision and 24 hour management of active medical problems listed below.  Physiatrist and rehab team continue to assess barriers to discharge/monitor patient progress toward functional and medical goals  Care Tool:  Bathing    Body parts bathed by patient: Right arm, Left arm, Chest, Abdomen, Right upper leg, Left upper leg   Body parts bathed by helper:  Front perineal area, Buttocks, Right lower leg, Left lower leg     Bathing assist Assist Level: Maximal Assistance - Patient 24 - 49%     Upper Body Dressing/Undressing Upper body dressing   What is the patient wearing?: Pull over shirt    Upper body assist Assist Level: Contact Guard/Touching assist    Lower Body Dressing/Undressing Lower body dressing      What is the patient wearing?: Incontinence brief, Pants     Lower body assist Assist for lower body dressing: Total Assistance - Patient < 25%     Toileting Toileting    Toileting assist Assist for toileting: Maximal Assistance - Patient 25 - 49%     Transfers Chair/bed transfer  Transfers assist     Chair/bed transfer assist level: Moderate Assistance - Patient 50 - 74%     Locomotion Ambulation   Ambulation assist      Assist level: 2 helpers(mod A with w/c follow for safety) Assistive device: Walker-rolling Max distance: 5   Walk 10 feet activity   Assist     Assist level: Moderate Assistance - Patient - 50 - 74% Assistive device: Walker-rolling   Walk 50 feet activity   Assist Walk 50 feet with 2 turns activity did not occur: Safety/medical concerns(pain)         Walk 150 feet activity   Assist Walk 150 feet activity did not occur: Safety/medical concerns         Walk 10 feet on uneven surface  activity   Assist Walk 10 feet on uneven surfaces activity did not occur: Safety/medical concerns         Wheelchair     Assist  Will patient use wheelchair at discharge?: Yes Type of Wheelchair: Manual    Wheelchair assist level: Minimal Assistance - Patient > 75%, Set up assist Max wheelchair distance: 58'    Wheelchair 50 feet with 2 turns activity    Assist        Assist Level: Minimal Assistance - Patient > 75%   Wheelchair 150 feet activity     Assist Wheelchair 150 feet activity did not occur: Safety/medical concerns          Medical  Problem List and Plan: 1.Functional deficits and lower extremity weaknesssecondary to 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  Continue CIR  Prolonged discussion with husband regarding medications 2. Antithrombotics: -DVT/anticoagulation:Pharmaceutical:Heparin -antiplatelet therapy: N/A 3. Pain Management:Will continue oxycodone Oxycodone PRN  Continue lidocaine patch to left hip.  Appears to be relatively controlled on 7/30 with medications 4. Mood:Team to provide ego support. LCSW to follow for evaluation and support when appropriate. -antipsychotic agents: Continue Abilify. 5. Neuropsych: This patientis not fully capable of making decisions onherown behalf.  Tele-sitter for safety  Discussed anxiety with Neuropsych 6. Skin/Wound Care:Routine pressure relief measures. 7. Fluids/Electrolytes/Nutrition:Monitor I/O.  8. HTN: Monitor BP tid--continue Cozaar and Norvasc.  Vitals:   04/24/19 0056 04/24/19 0317  BP: 108/73 (!) 143/72  Pulse: 97 94  Resp: 16 18  Temp: 97.8 F (36.6 C) 98.1 F (36.7 C)  SpO2: 97% 95%   Labile on 7/30 9.  Likely CKD:SCr was around 1.0 prior to admission.  Creatinine 1.02 on 7/27  Continue to monitor 10. Delirium: Keep lights on during the day --monitor sleep wake cycle and set schedule. Limit cognitively altering medications as much as possible 11. MDD with anxiety:Was undergoing ECT at [facility] every 3 months (prior to start of pandemic) Continue Abilify, Cymbaltaand Wellbutrin  Klonopin changed to scheduled 0.25 with breakfast and lunch  Discussed use of aroma therapy with nursing  See #4  Team to provide support with reassurance  12. ABLA: Monitor for signs of bleeding.   Hemoglobin 9.1 on 7/27, labs ordered for tomorrow  Hemoccult negative x2  Continue to monitor 13. Leucocytosis:  Resolved 14. Abnormal UA  Equivocal UA  Ucx pending 15. Sleep disturbance  Melatonin started on 7/30  >35 minutes spent with >30 minutes in counseling and coordination of care with patient regarding anxiety, pain, medications, interventions, etc.    LOS: 10 days A FACE TO FACE EVALUATION WAS PERFORMED  Ankit Lorie Phenix 04/24/2019, 8:03 AM

## 2019-04-24 NOTE — Progress Notes (Addendum)
Physical Therapy Session Note  Patient Details  Name: Tiffany Gill MRN: [id number] Date of Birth: February 25, 1941  Today's Date: 04/24/2019 PT Individual Time: 1300-1405 PT Individual Time Calculation (min): 65 min   Short Term Goals:  Week 2:  PT Short Term Goal 1 (Week 2): Patient will perform bed mobility with min A. PT Short Term Goal 2 (Week 2): Patient will perform basic transfers with min A using LRAD. PT Short Term Goal 3 (Week 2): Patient will propel w/c >30 feet. PT Short Term Goal 4 (Week 2): Patient will re-initiate gait training >10 feet using LRAD.  Skilled Therapeutic Interventions/Progress Updates:   Supine> sit with min assist, to L, in flat bed.  Attempted stand pivot but pt unable to keep L foot under her.  Squat pivot to w/c, to L with mod> max assist.  Sit> stand in static stander with mod assist and max cues for reaching forward bil hands.  Pt tolerated 1st bout x 45 seconds, wt shifting R><L.  2nd bout x 1 minute, including using R hand to place pegs in correct row by color with 80% accuracy, 3rd bout x 1 minute to remove 15 pegs from board with min assist to initiate.  4th bout x 1 minute of standing while holding hands with her husband standing in front of stander, wt shifting L><R.  Yoga block between feet to prevent crossing over.  As pt fatigued, she demonstrated greater posterior lean in supported standing.  Pt drank a couple of ounces of water during session, with min assist to place straw in her mouth as she held cup with either hand.  W/c propulsion with hand over hand assist> close superviison, x 10' x 2.  Slide board transfer to L to return to bed, slightly downhill.  Pt initated movement, mod assist overall.  Min assist to supine.  At end of session, pt resting in bed with needs at hand, husband present, and alarm set.     Therapy Documentation Precautions:  Precautions Precautions: Back, Fall Precaution Booklet Issued: No Precaution Comments:  reviewed back precautions Required Braces or Orthoses: Spinal Brace Spinal Brace: Applied in sitting position, Lumbar corset Restrictions Weight Bearing Restrictions: No  Pain: no c/o         Therapy/Group: Individual Therapy  Sayla Golonka 04/24/2019, 2:15 PM

## 2019-04-24 NOTE — Progress Notes (Signed)
04/24/19 0056  What Happened  Was fall witnessed? No  Was patient injured? No  Patient found on floor;other (Comment) (in kneeling position )  Found by Staff-comment (Tiffany Young, LPN )  Stated prior activity ambulating-unassisted  Follow Up  MD notified Danella Sensing, NP  Time MD notified [phone] (After 2 prior attempts )  Additional tests No  Simple treatment Other (comment) (repositioned )  Progress note created (see row info) Yes  Adult Fall Risk Assessment  Risk Factor Category (scoring not indicated) High fall risk per protocol (document High fall risk)  Age 78  Fall History: Fall within 6 months prior to admission 5  Elimination; Bowel and/or Urine Incontinence 2  Elimination; Bowel and/or Urine Urgency/Frequency 0  Medications: includes PCA/Opiates, Anti-convulsants, Anti-hypertensives, Diuretics, Hypnotics, Laxatives, Sedatives, and Psychotropics 5  Patient Care Equipment 0  Mobility-Assistance 2  Mobility-Gait 2  Mobility-Sensory Deficit 0  Altered awareness of immediate physical environment 0  Impulsiveness 2  Lack of understanding of one's physical/cognitive limitations 0  Total Score 20  Patient Fall Risk Level High fall risk  Adult Fall Risk Interventions  Required Bundle Interventions *See Row Information* High fall risk - low, moderate, and high requirements implemented  Additional Interventions Use of appropriate toileting equipment (bedpan, BSC, etc.);Lap belt while in chair/wheelchair;Camera surveillance (with patient/family notification & education) Oncologist)  Screening for Fall Injury Risk (To be completed on HIGH fall risk patients) - Assessing Need for Low Bed  Risk For Fall Injury- Low Bed Criteria Previous fall this admission  Will Implement Low Bed and Floor Mats Yes  Specialty Low Bed Contraindicated Requires therapeutic low air loss mattress  Screening for Fall Injury Risk (To be completed on HIGH fall risk patients who do not meet crieteria  for Low Bed) - Assessing Need for Floor Mats Only  Risk For Fall Injury- Criteria for Floor Mats Confusion/dementia (+NuDESC, CIWA, TBI, etc.)  Will Implement Floor Mats Yes  Vitals  Temp 97.8 F (36.6 C)  BP 108/73  MAP (mmHg) 85  BP Location Left Arm  BP Method Automatic  Patient Position (if appropriate) Lying  Pulse Rate 97  Resp 16  Oxygen Therapy  SpO2 97 %  O2 Device Room Air  Pain Assessment  Pain Scale Faces  Faces Pain Scale 0 (Chronic pain but not as a result of fall)  PCA/Epidural/Spinal Assessment  Respiratory Pattern Regular;Unlabored  Neurological  Neuro (WDL) X  Level of Consciousness Alert  Orientation Level Oriented to person;Disoriented to place;Disoriented to time;Disoriented to situation  Ship broker  Pupil Assessment  No  Motor Function/Sensation Assessment Grip;Motor response  R Hand Grip Weak  L Hand Grip Weak   R Foot Dorsiflexion Weak  L Foot Dorsiflexion Weak  R Foot Plantar Flexion Weak  L Foot Plantar Flexion Weak  RUE Motor Response Purposeful movement  RUE Sensation Full sensation  RUE Motor Strength 4  LUE Motor Response Purposeful movement  LUE Sensation Full sensation  LUE Motor Strength 4  RLE Motor Response Purposeful movement  RLE Sensation Full sensation  RLE Motor Strength 4  LLE Motor Response Purposeful movement  LLE Sensation Full sensation  LLE Motor Strength 4  Neuro Symptoms Anxiety  Neuro symptoms relieved by Rest;Relaxation techniques (Comment)  Neuro Additional Assessments No  Neuro Additional Assessments No  Musculoskeletal  Musculoskeletal (WDL) X  Assistive Device Wheelchair  Generalized Weakness Yes  Weight Bearing Restrictions No  Musculoskeletal Details  RLE Weakness  LLE Weakness  Lower Back  Limited movement  Lower Back Ortho/Supportive Device Back Brace  Back Brace Off  Integumentary  Integumentary (WDL) X  Skin Integrity Surgical Incision (see LDA);Ecchymosis;Skin  tear  Ecchymosis Location Abdomen;Arm  Ecchymosis Location Orientation Bilateral  Ecchymosis Intervention Other (Comment) (Assessed)  Skin Tear Location Head  Skin Tear Location Orientation Right  Skin Tear Intervention Other (Comment) (Assessed)

## 2019-04-24 NOTE — Progress Notes (Signed)
Occupational Therapy Session Note  Patient Details  Name: Tiffany Gill MRN: [id number] Date of Birth: 06-23-1941  Today's Date: 04/24/2019 OT Individual Time: 1100-1145 OT Individual Time Calculation (min): 45 min  and Today's Date: 04/24/2019 OT Missed Time: 15 Minutes Missed Time Reason: Pain;Patient fatigue;Other (comment)(increased anxiety)   Short Term Goals: Week 2:  OT Short Term Goal 1 (Week 2): STG=LTG secondary to ELOS (LTG downgraded)  Skilled Therapeutic Interventions/Progress Updates:    Pt asleep in bed upon arrival with husband present at bedside.  Pt easily aroused and startled on awakening.  Pt agreeable to sitting EOB and donning LSO but became increasingly anxious when sitting EOB to don socks/shoes.  Pt became resistant to sitting EOB and actively lay back onto bed.  Encouraged breathing techniques to decrease anxiety and donned pt's socks and shoes while supine.  Pt sat EOB again (min A) and performed squat pivot transfer with max A.  Pt agreeable to attempt walking.  Pt performed sit<>stand with min A and initiated advancing RW.  Pt immediately began scissoring BLE and unable to place feet correctly to continue amb.  Pt's husband unsuccessful but therapist able to place feet appropriately to complete a stand pivot transfer.  Pt required mod A for lateral scoots to HOB.  Pt with increased posterior lean when attempting sit<>stand. Pt repositioned in bed and remained in bed with all needs within reach and bed alarm activated.  Husband present.   Therapy Documentation Precautions:  Precautions Precautions: Back, Fall Precaution Booklet Issued: No Precaution Comments: reviewed back precautions Required Braces or Orthoses: Spinal Brace Spinal Brace: Applied in sitting position, Lumbar corset Restrictions Weight Bearing Restrictions: No General: General OT Amount of Missed Time: 15 Minutes Pain: Pain Assessment Pain Scale: Faces Faces Pain Scale: No  hurt   Therapy/Group: Individual Therapy  [name] 04/24/2019, 11:49 AM

## 2019-04-24 NOTE — Plan of Care (Signed)
  Problem: Consults Goal: RH SPINAL CORD INJURY PATIENT EDUCATION Description:  See Patient Education module for education specifics.  Outcome: Progressing   Problem: SCI BOWEL ELIMINATION Goal: RH STG MANAGE BOWEL WITH ASSISTANCE Description: STG Manage Bowel with min. Assistance. Outcome: Progressing Goal: RH STG SCI MANAGE BOWEL WITH MEDICATION WITH ASSISTANCE Description: STG SCI Manage bowel with medication with min assistance. Outcome: Progressing   Problem: SCI BLADDER ELIMINATION Goal: RH STG MANAGE BLADDER WITH ASSISTANCE Description: STG Manage Bladder With min. Assistance Outcome: Progressing   Problem: RH SKIN INTEGRITY Goal: RH STG SKIN FREE OF INFECTION/BREAKDOWN Description: With min. assist Outcome: Progressing Goal: RH STG MAINTAIN SKIN INTEGRITY WITH ASSISTANCE Description: STG Maintain Skin Integrity With min. Assistance. Outcome: Progressing Goal: RH STG ABLE TO PERFORM INCISION/WOUND CARE W/ASSISTANCE Description: STG Able To Perform Incision/Wound Care With min.Assistance. Outcome: Progressing   Problem: RH SAFETY Goal: RH STG ADHERE TO SAFETY PRECAUTIONS W/ASSISTANCE/DEVICE Description: STG Adhere to Safety Precautions With cues and reminder min Assistance/Device. Outcome: Progressing   Problem: RH PAIN MANAGEMENT Goal: RH STG PAIN MANAGED AT OR BELOW PT'S PAIN GOAL Description: Less than 3. Outcome: Progressing   Problem: RH KNOWLEDGE DEFICIT SCI Goal: RH STG INCREASE KNOWLEDGE OF SELF CARE AFTER SCI Description: Pt. And family will be able to identify environmental hazards and follow safety precations to prevent falls at home with cues and supervision assist from significant other.  Outcome: Progressing

## 2019-04-25 ENCOUNTER — Ambulatory Visit (INDEPENDENT_AMBULATORY_CARE_PROVIDER_SITE_OTHER): Payer: Medicare Other | Admitting: Psychology

## 2019-04-25 ENCOUNTER — Inpatient Hospital Stay (HOSPITAL_COMMUNITY): Payer: Medicare Other

## 2019-04-25 DIAGNOSIS — F332 Major depressive disorder, recurrent severe without psychotic features: Secondary | ICD-10-CM | POA: Diagnosis not present

## 2019-04-25 DIAGNOSIS — G479 Sleep disorder, unspecified: Secondary | ICD-10-CM

## 2019-04-25 LAB — CBC WITH DIFFERENTIAL/PLATELET
Abs Immature Granulocytes: 0.08 10*3/uL — ABNORMAL HIGH (ref 0.00–0.07)
Basophils Absolute: 0 10*3/uL (ref 0.0–0.1)
Basophils Relative: 0 %
Eosinophils Absolute: 0 10*3/uL (ref 0.0–0.5)
Eosinophils Relative: 0 %
HCT: 30.5 % — ABNORMAL LOW (ref 36.0–46.0)
Hemoglobin: 9.6 g/dL — ABNORMAL LOW (ref 12.0–15.0)
Immature Granulocytes: 1 %
Lymphocytes Relative: 14 %
Lymphs Abs: 1.2 10*3/uL (ref 0.7–4.0)
MCH: 31.5 pg (ref 26.0–34.0)
MCHC: 31.5 g/dL (ref 30.0–36.0)
MCV: 100 fL (ref 80.0–100.0)
Monocytes Absolute: 0.8 10*3/uL (ref 0.1–1.0)
Monocytes Relative: 9 %
Neutro Abs: 6.5 10*3/uL (ref 1.7–7.7)
Neutrophils Relative %: 76 %
Platelets: 406 10*3/uL — ABNORMAL HIGH (ref 150–400)
RBC: 3.05 MIL/uL — ABNORMAL LOW (ref 3.87–5.11)
RDW: 14.5 % (ref 11.5–15.5)
WBC: 8.7 10*3/uL (ref 4.0–10.5)
nRBC: 0 % (ref 0.0–0.2)

## 2019-04-25 LAB — URINE CULTURE: Culture: >=100000 — AB

## 2019-04-25 LAB — OCCULT BLOOD X 1 CARD TO LAB, STOOL: Fecal Occult Bld: NEGATIVE

## 2019-04-25 MED ORDER — CEPHALEXIN 250 MG PO CAPS
250.0000 mg | ORAL_CAPSULE | Freq: Four times a day (QID) | ORAL | Status: DC
  Administered 2019-04-25 – 2019-05-02 (×27): 250 mg via ORAL
  Filled 2019-04-25 (×28): qty 1

## 2019-04-25 MED ORDER — MELATONIN 3 MG PO TABS
3.0000 mg | ORAL_TABLET | Freq: Every day | ORAL | Status: DC
  Administered 2019-04-25 – 2019-05-03 (×9): 3 mg via ORAL
  Filled 2019-04-25 (×10): qty 1

## 2019-04-25 NOTE — Plan of Care (Signed)
Alert/oriented X2. In bed , calm, cooperative. Skin warm and dry. Pt tried earlier to get out of bed, was reoriented. Denies any discomfort at this time. No respiratory distress noted.

## 2019-04-25 NOTE — Progress Notes (Signed)
[place] PHYSICAL MEDICINE & REHABILITATION PROGRESS NOTE  Subjective/Complaints: Patient seen laying in bed this morning.  She states she slept well overnight.  She slept fairly poor sleep chart.  Discussed with nursing family concerns regarding medications.  ROS: Denies CP SOB, N/V/D  Objective:   No results found. Recent Labs    04/25/19 0745  WBC 8.7  HGB 9.6*  HCT 30.5*  PLT 406*   No results for input(s): NA, K, CL, CO2, GLUCOSE, BUN, CREATININE, CALCIUM in the last 72 hours.  Intake/Output Summary (Last 24 hours) at 04/25/2019 1241 Last data filed at 04/25/2019 0900 Gross per 24 hour  Intake 360 ml  Output -  Net 360 ml     Physical Exam: Vital Signs Blood pressure (!) 144/129, pulse 92, temperature 98.8 F (37.1 C), temperature source Oral, resp. rate (!) 21, height 5' (1.524 m), weight 53.1 kg, SpO2 94 %. Constitutional: No distress . Vital signs reviewed. HENT: Normocephalic.  Atraumatic. Eyes: EOMI.  No discharge. Cardiovascular: No JVD. Respiratory: Normal effort. GI: Non-distended. Musc: No edema or tenderness in extremities. Neurologic: Alert  Motor: 4-4+/5 grossly throughout, unchanged Skin: Distal aspect of incision with some serosanguineous drainage Psych: Anxious  Assessment/Plan: 1. Functional deficits secondary to Lumbar radiculopathy with LLE weakness which require 3+ hours per day of interdisciplinary therapy in a comprehensive inpatient rehab setting.  Physiatrist is providing close team supervision and 24 hour management of active medical problems listed below.  Physiatrist and rehab team continue to assess barriers to discharge/monitor patient progress toward functional and medical goals  Care Tool:  Bathing    Body parts bathed by patient: Right arm, Left arm, Chest, Abdomen, Right upper leg, Left upper leg   Body parts bathed by helper: Front perineal area, Buttocks, Right lower leg, Left lower leg     Bathing assist Assist Level:  Maximal Assistance - Patient 24 - 49%     Upper Body Dressing/Undressing Upper body dressing   What is the patient wearing?: Pull over shirt    Upper body assist Assist Level: Contact Guard/Touching assist    Lower Body Dressing/Undressing Lower body dressing      What is the patient wearing?: Incontinence brief, Pants     Lower body assist Assist for lower body dressing: Total Assistance - Patient < 25%     Toileting Toileting    Toileting assist Assist for toileting: Maximal Assistance - Patient 25 - 49%     Transfers Chair/bed transfer  Transfers assist     Chair/bed transfer assist level: Moderate Assistance - Patient 50 - 74%     Locomotion Ambulation   Ambulation assist      Assist level: 2 helpers(mod A with w/c follow for safety) Assistive device: Walker-rolling Max distance: 5   Walk 10 feet activity   Assist     Assist level: Moderate Assistance - Patient - 50 - 74% Assistive device: Walker-rolling   Walk 50 feet activity   Assist Walk 50 feet with 2 turns activity did not occur: Safety/medical concerns(pain)         Walk 150 feet activity   Assist Walk 150 feet activity did not occur: Safety/medical concerns         Walk 10 feet on uneven surface  activity   Assist Walk 10 feet on uneven surfaces activity did not occur: Safety/medical concerns         Wheelchair     Assist Will patient use wheelchair at discharge?: Yes Type of Wheelchair:  Manual    Wheelchair assist level: Contact Guard/Touching assist Max wheelchair distance: 10    Wheelchair 50 feet with 2 turns activity    Assist        Assist Level: Minimal Assistance - Patient > 75%   Wheelchair 150 feet activity     Assist Wheelchair 150 feet activity did not occur: Safety/medical concerns          Medical Problem List and Plan: 1.Functional deficits and lower extremity weaknesssecondary to 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  Continue CIR 2. Antithrombotics: -DVT/anticoagulation:Pharmaceutical:Heparin -antiplatelet therapy: N/A 3. Pain Management:Will continue oxycodone Oxycodone PRN  Continue lidocaine patch to left hip.  Appears to be relatively stable on 7/31 with medications 4. Mood:Team to provide ego support. LCSW to follow for evaluation and support when appropriate. -antipsychotic agents: Continue Abilify. 5. Neuropsych: This patientis not fully capable of making decisions onherown behalf.  Continue tele-sitter for safety  Discussed anxiety with Neuropsych 6. Skin/Wound Care:Routine pressure relief measures.  Wound culture ordered  Will start empiric Keflex given change in mental status, equivocal UA, and wound drainage 7. Fluids/Electrolytes/Nutrition:Monitor I/O.  8. HTN: Monitor BP tid--continue Cozaar and Norvasc.  Vitals:   04/24/19 1925 04/25/19 0507  BP: (!) 151/73 (!) 144/129  Pulse: 91 92  Resp: 20 (!) 21  Temp: 97.7 F (36.5 C) 98.8 F (37.1 C)  SpO2: 94% 94%   Elevated on 7/31 9.  Likely CKD:SCr was around 1.0 prior to admission.  Creatinine 1.02 on 7/27  Continue to monitor 10. Delirium: Keep lights on during the day --monitor sleep wake cycle and set schedule. Limit cognitively altering medications as much as possible 11. MDD with anxiety:Was undergoing ECT at [facility] every 3 months (prior to start of pandemic) Continue Abilify, Cymbaltaand Wellbutrin  Klonopin changed to scheduled 0.25 with breakfast and lunch  Discussed use of aroma therapy with nursing  See #4  Team to provide support with reassurance  12. ABLA: Monitor for signs of bleeding.   Hemoglobin 9.6 on 7/31  Hemoccult negative x2  Continue to monitor 13. Leucocytosis: Resolved 14. Abnormal UA  Equivocal UA  Ucx with multiple species 15.  Sleep disturbance  Melatonin started on 7/30, increased on 7/31  LOS: 11 days A FACE TO FACE EVALUATION WAS PERFORMED  Ankit Lorie Phenix 04/25/2019, 12:41 PM

## 2019-04-25 NOTE — Progress Notes (Signed)
Physical Therapy Note  Patient Details  Name: Tiffany Gill MRN: [id number] Date of Birth: 1941/01/24 Today's Date: 04/25/2019    Patient sleeping with her husband at bedside upon entry. Patient easily aroused to verbal stimulus, but sated she needed to sleep and is too tired to work with therapy today. Assisted patient's husband with scooting patient up in bed for comfort and better positioning. Patient in bed with bed alarm set, husband at bedside, and all needs in reach. Patient missed 60 min of skilled PT due to fatigue. Will re-attempt to see patient to make up missed time as able.   Dorella Laster L Maysen Sudol PT, DPT  04/25/2019, 1:22 PM

## 2019-04-25 NOTE — Progress Notes (Signed)
Occupational Therapy Session Note  Patient Details  Name: KAMBRIA GRIMA MRN: [id number] Date of Birth: 20-Nov-1940  Today's Date: 04/25/2019 OT Individual Time: 1100-1130 OT Individual Time Calculation (min): 30 min  and Today's Date: 04/25/2019 OT Missed Time: 30 Minutes Missed Time Reason: Other (comment)(unable to arouse pt sufficiently to participate)   Short Term Goals: Week 2:  OT Short Term Goal 1 (Week 2): STG=LTG secondary to ELOS (LTG downgraded)  Skilled Therapeutic Interventions/Progress Updates:    Pt asleep in bed upon arrival with husband present.  Attempted to arouse pt without alarming her and instigated increased anxiety/alarm.  Gentle tactile and verbal cues provided with no results.  Cold wash cloth applied to face with no positive results.  Husband in agreement that a more forceful approach would result in alarm and increased anxiety.  Pt remained in bed with husband at bedside. Pt missed 30 mins skilled OT services.   Therapy Documentation Precautions:  Precautions Precautions: Back, Fall Precaution Booklet Issued: No Precaution Comments: reviewed back precautions Required Braces or Orthoses: Spinal Brace Spinal Brace: Applied in sitting position, Lumbar corset Restrictions Weight Bearing Restrictions: No General: General OT Amount of Missed Time: 30 Minutes Vital Signs:  Pain:  Pt with no s/s of pain (asleep)   Therapy/Group: Individual Therapy  [name] 04/25/2019, 11:50 AM

## 2019-04-25 NOTE — Progress Notes (Signed)
Patient spouse present for majority of the day until about 3PM, patient sleeping spouse requested for staff to leave her alone and let her sleep. After patient spouse left facility, patient continued to sleep and refused any further medications attempted to be administered. Continue plan of care. Audie Clear, LPN

## 2019-04-25 NOTE — Progress Notes (Signed)
Physical Therapy Note  Patient Details  Name: Tiffany Gill MRN: [id number] Date of Birth: [birth year] Today's Date: 04/25/2019    Returned to re-attempt PT session. Patient was asleep upon entry with her husband at bedside. Patient did not respond to verbal or tactile stimulus, but was breathing evenly and appeared in no distress. Discussed POC and ELOS with husband. Husband expressed concerns about d/c date set for 03/29/19 due to patient's limited progress due to anxiety. Husband reports his daughter and a friend may be able to provide some intermittent assistance at d/c. PT discussed plan to discuss the husbands concerns with the team and determine a new discharge date as appropriate next week. Patient's husband was very appreciative following discussion.   Aravind Chrismer L Enjoli Tidd PT, DPT  04/25/2019, 2:30 PM

## 2019-04-25 NOTE — Progress Notes (Signed)
Called regarding patient's wound, I'm currently covering for Dr. Saintclair Halsted. Dellia Nims, there is a ~1.5-2cm dehiscence that appears to be only superficial. The remainder of the wound appears well healed without significant tension on this portion of the wound. I did see some yellow material that looked like granulation tissue, but was unable to express any drainage or purulent material. No significant erythema or induration around that portion of the incision and it is not very painful. I think that, at this time, it appears to be an isolated area of superficial dehiscence, so I would recommend treating this with local wound care for now. If the dehiscence extends further or it begins to drain purulent material, please let me know, I will be on call all weekend.  Judith Part, MD

## 2019-04-26 ENCOUNTER — Inpatient Hospital Stay (HOSPITAL_COMMUNITY): Payer: Medicare Other

## 2019-04-26 DIAGNOSIS — T8131XA Disruption of external operation (surgical) wound, not elsewhere classified, initial encounter: Secondary | ICD-10-CM

## 2019-04-26 DIAGNOSIS — R52 Pain, unspecified: Secondary | ICD-10-CM

## 2019-04-26 DIAGNOSIS — T8131XS Disruption of external operation (surgical) wound, not elsewhere classified, sequela: Secondary | ICD-10-CM

## 2019-04-26 MED ORDER — LIDOCAINE 5 % EX PTCH
2.0000 | MEDICATED_PATCH | CUTANEOUS | Status: DC
  Administered 2019-04-26 – 2019-05-04 (×9): 2 via TRANSDERMAL
  Filled 2019-04-26 (×9): qty 2

## 2019-04-26 NOTE — Progress Notes (Signed)
[place] PHYSICAL MEDICINE & REHABILITATION PROGRESS NOTE  Subjective/Complaints: Patient seen lying in bed this morning.  She slept fairly overnight per sleep chart.  Approach by nursing, stating patient with leg pain early this a.m. that was resolved with Tylenol and ice packs.  Upon arrival, patient noted to be anxious stating that her leg hurt and was awaiting neurosurgery.  Encouraged and perform deep breathing exercises with patient again.  She was seen by neurosurgery yesterday, notes reviewed, appreciate evaluation.  ROS: Denies CP SOB, N/V/D  Objective:   No results found. Recent Labs    04/25/19 0745  WBC 8.7  HGB 9.6*  HCT 30.5*  PLT 406*   No results for input(s): NA, K, CL, CO2, GLUCOSE, BUN, CREATININE, CALCIUM in the last 72 hours.  Intake/Output Summary (Last 24 hours) at 04/26/2019 1037 Last data filed at 04/26/2019 0742 Gross per 24 hour  Intake 220 ml  Output -  Net 220 ml     Physical Exam: Vital Signs Blood pressure (!) 141/69, pulse 84, temperature 98.3 F (36.8 C), temperature source Oral, resp. rate 18, height 5' (1.524 m), weight 53.1 kg, SpO2 100 %. Constitutional: + Distressed. Vital signs reviewed. HENT: Normocephalic.  Atraumatic. Eyes: EOMI.  No discharge. Cardiovascular: No JVD. Respiratory: Normal effort. GI: Non-distended. Musc: No edema or tenderness in extremities. Neurologic: Alert Motor: 4-4+/5 grossly throughout, for the most part, right lower extremity limited due to anxiety and?  Pain this a.m. Skin: Distal dehiscence of wound Psych: Anxious  Assessment/Plan: 1. Functional deficits secondary to Lumbar radiculopathy with LLE weakness which require 3+ hours per day of interdisciplinary therapy in a comprehensive inpatient rehab setting.  Physiatrist is providing close team supervision and 24 hour management of active medical problems listed below.  Physiatrist and rehab team continue to assess barriers to discharge/monitor patient  progress toward functional and medical goals  Care Tool:  Bathing    Body parts bathed by patient: Right arm, Left arm, Chest, Abdomen, Right upper leg, Left upper leg   Body parts bathed by helper: Front perineal area, Buttocks, Right lower leg, Left lower leg     Bathing assist Assist Level: Maximal Assistance - Patient 24 - 49%     Upper Body Dressing/Undressing Upper body dressing   What is the patient wearing?: Pull over shirt    Upper body assist Assist Level: Contact Guard/Touching assist    Lower Body Dressing/Undressing Lower body dressing      What is the patient wearing?: Incontinence brief, Pants     Lower body assist Assist for lower body dressing: Total Assistance - Patient < 25%     Toileting Toileting    Toileting assist Assist for toileting: Maximal Assistance - Patient 25 - 49%     Transfers Chair/bed transfer  Transfers assist     Chair/bed transfer assist level: Moderate Assistance - Patient 50 - 74%     Locomotion Ambulation   Ambulation assist      Assist level: 2 helpers(mod A with w/c follow for safety) Assistive device: Walker-rolling Max distance: 5   Walk 10 feet activity   Assist     Assist level: Moderate Assistance - Patient - 50 - 74% Assistive device: Walker-rolling   Walk 50 feet activity   Assist Walk 50 feet with 2 turns activity did not occur: Safety/medical concerns(pain)         Walk 150 feet activity   Assist Walk 150 feet activity did not occur: Safety/medical concerns  Walk 10 feet on uneven surface  activity   Assist Walk 10 feet on uneven surfaces activity did not occur: Safety/medical concerns         Wheelchair     Assist Will patient use wheelchair at discharge?: Yes Type of Wheelchair: Manual    Wheelchair assist level: Contact Guard/Touching assist Max wheelchair distance: 10    Wheelchair 50 feet with 2 turns activity    Assist        Assist  Level: Minimal Assistance - Patient > 75%   Wheelchair 150 feet activity     Assist Wheelchair 150 feet activity did not occur: Safety/medical concerns          Medical Problem List and Plan: 1.Functional deficits and lower extremity weaknesssecondary to 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  Continue CIR  Back x-ray ordered 2. Antithrombotics: -DVT/anticoagulation:Pharmaceutical:Heparin -antiplatelet therapy: N/A 3. Pain Management: Oxycodone PRN  Continue lidocaine patch to back, ordered for right hip as well. 4. Mood:Team to provide ego support. LCSW to follow for evaluation and support when appropriate. -antipsychotic agents: Continue Abilify. 5. Neuropsych: This patientis not fully capable of making decisions onherown behalf.  Continue tele-sitter for safety  Discussed anxiety with Neuropsych 6. Skin/Wound Care:Routine pressure relief measures.  Evaluated by neurosurgery- suggesting superficial dehiscence  Wound culture ordered, pending and antibiotics already started  Will start empiric Keflex given change in mental status, equivocal UA, and wound drainage 7. Fluids/Electrolytes/Nutrition:Monitor I/O.  8. HTN: Monitor BP tid--continue Cozaar and Norvasc.  Vitals:   04/25/19 1950 04/26/19 0419  BP: 120/75 (!) 141/69  Pulse: 83 84  Resp: 16 18  Temp: 98.1 F (36.7 C) 98.3 F (36.8 C)  SpO2: 97% 100%   Labile on 8/1 9.  Likely CKD:SCr was around 1.0 prior to admission.  Creatinine 1.02 on 7/2, labs ordered for Monday  Continue to monitor 10. Delirium: Keep lights on during the day --monitor sleep wake cycle and set schedule. Limit cognitively altering medications as much as possible 11. MDD with anxiety:Was undergoing ECT at [facility] every 3 months (prior to start of pandemic) Continue Abilify, Cymbaltaand  Wellbutrin  Klonopin changed to scheduled 0.25 with breakfast and lunch  Discussed use of aroma therapy with nursing  See #4  Team to provide support with reassurance  12. ABLA: Monitor for signs of bleeding.   Hemoglobin 9.6 on 7/31  Hemoccult negative again  Continue to monitor 13. Leucocytosis: Resolved 14. Abnormal UA  Equivocal UA  Ucx with multiple species 15. Sleep disturbance  Melatonin started on 7/30, increased on 7/31  LOS: 12 days A FACE TO FACE EVALUATION WAS PERFORMED  Ankit Lorie Phenix 04/26/2019, 10:37 AM

## 2019-04-26 NOTE — Patient Care Conference (Signed)
Inpatient RehabilitationTeam Conference and Plan of Care Update Date: 04/23/2019   Time: 2:10 PM    Patient Name: Tiffany Gill      Medical Record Number: [id number]  Date of Birth: 07/20/1941 Sex: Female         Room/Bed: 4M12C/4M12C-01 Payor Info: Payor: MEDICARE / Plan: MEDICARE PART A AND B / Product Type: *No Product type* /    Admitting Diagnosis: 5. Gen Team  Other Neuro, Lumbar radiculopathy; 13-14days  Admit Date/Time:  04/14/2019  4:44 PM Admission Comments: No comment available   Primary Diagnosis:  Spondylolisthesis of lumbar region Principal Problem: Spondylolisthesis of lumbar region  Patient Active Problem List   Diagnosis Date Noted  . Pain   . Rupture of operation wound   . Sleep disturbance   . Subacute delirium   . CKD (chronic kidney disease), stage III ([place])   . Essential hypertension   . Postoperative pain   . Anxiety state   . Moderate episode of recurrent major depressive disorder ([place])   . Spondylolisthesis of lumbar region 04/14/2019  . Spondylisthesis 04/08/2019  . Back pain   . Anxiety and depression   . Chronic pain syndrome   . Acute blood loss anemia   . Stage 3 chronic kidney disease ([place])   . Spondylolisthesis at L4-L5 level 03/17/2019    Expected Discharge Date: Expected Discharge Date: (TBD)  Team Members Present: Physician leading conference: Dr. Delice Lesch Social Worker Present: Alfonse Alpers, LCSW Nurse Present: Rosita Fire, RN PT Present: Georjean Mode, PT OT Present: Willeen Cass, OT;Roanna Epley, COTA PPS Coordinator present : Gunnar Fusi, SLP     Current Status/Progress Goal Weekly Team Focus  Medical   Functional deficits and lower extremity weakness  secondary to lumbar stenosis and poly-radiculopathy s/p decompression and L3-S1 fusion with subsequent re-do, also debilitated  Improve mobility, mood, pain  See above   Bowel/Bladder   Pt is incontinent of B/B through out the night. Pt is also unable to tell if adult  brief is soiled.  Pt will be continent of B/B during the day and night.  Timed toileting.   Swallow/Nutrition/ Hydration             ADL's   pt continues to exhibit increased anxiety with all transitional movements inhibiting active participation in therapy; pt currently mod/max A for BADLs at bed level and sitting EOB, pt unable to use BSC 2/2 pain/anxiety  downgraded to min A overall  activity tolerance, BADL training, functional transfers, education, safety awareness, family education   Mobility   Mod A for bed mobility and transfer, ambulated 10 feet with RW with Mod A last week, but has not progressed with ambulation this week due to increased anxiety.  Downgraded goals to min A overall, except mod A for stairs, due to slow progress secondary to pain and anxiety during therapy.  Relaxation techniques, strenght/ROM, bed mobility, transfer training, gait training, balance, patient/family education, [facility]   Communication             Safety/Cognition/ Behavioral Observations            Pain   Pt c/o extreme pain in the lower back, R hip and both legs. due to mental status, pt is unable to give pain score.  Pt will be free of pain  Assess pain Qshift/ PRN   Skin   Surgical incision on lower back with honeycomb, little drainage noted.  Remain free of skin infection and breakdown.  Assess skin Qshift/PRN  Rehab Goals Patient on target to meet rehab goals: No Rehab Goals Revised: none *See Care Plan and progress notes for long and short-term goals.     Barriers to Discharge  Current Status/Progress Possible Resolutions Date Resolved   Physician    Medical stability;Other (comments)  Significant anxiety/depression  See above  Therapies, optimize mood/pain meds      Nursing                  PT  Inaccessible home environment  Limited progress due to increased anxiety and fear of falling.  Patient has 3-4 STE with 1 rail.              OT                  SLP                SW                 Discharge Planning/Teaching Needs:  Pt plans to return to her home where her husband will provide the necessary level of care.  Family education to occure prior to d/c.   Team Discussion:  Pt with acute blood loss, anxiety and pain medication adjustments, pt missed ECT.  Husband does not want pt to take Tramadol and PA has already d/c'd this.  PRN Klonopin.  Back incision is red, RN put betadine and tegaderm on it.  Pt did a little better with husband present for OT visit, her best day since eval, but she was shaking everywhere.  OT worked on deep breathing.  Pt is strong enough to do what therapists are asking, but her anxiety, not pain limits her.  Pt has not walked since day of eval 10'.  ST is done working with pt.  Revisions to Treatment Plan:  none    Continued Need for Acute Rehabilitation Level of Care: The patient requires daily medical management by a physician with specialized training in physical medicine and rehabilitation for the following conditions: Daily direction of a multidisciplinary physical rehabilitation program to ensure safe treatment while eliciting the highest outcome that is of practical value to the patient.: Yes Daily medical management of patient stability for increased activity during participation in an intensive rehabilitation regime.: Yes Daily analysis of laboratory values and/or radiology reports with any subsequent need for medication adjustment of medical intervention for : Neurological problems;Post surgical problems;Mood/behavior problems   I attest that I was present, lead the team conference, and concur with the assessment and plan of the team.Team conference was held via web/ teleconference due to [place] - 19.   Ragna Kramlich, Silvestre Mesi 04/26/2019, 7:16 PM

## 2019-04-26 NOTE — Progress Notes (Signed)
Physical Therapy Session Note  Patient Details  Name: Tiffany Gill MRN: [id number] Date of Birth: [birth year]  Today's Date: 04/26/2019 PT Individual Time: 1305-1350 PT Individual Time Calculation (min): 45 min   Short Term Goals: Week 2:  PT Short Term Goal 1 (Week 2): Patient will perform bed mobility with min A. PT Short Term Goal 2 (Week 2): Patient will perform basic transfers with min A using LRAD. PT Short Term Goal 3 (Week 2): Patient will propel w/c >30 feet. PT Short Term Goal 4 (Week 2): Patient will re-initiate gait training >10 feet using LRAD.  Skilled Therapeutic Interventions/Progress Updates:     Patient in bed asleep with husband at bedside upon PT arrival. Patient easily aroused and agreeable to PT session. Patient reported increased posterolateral R hip/buttock pain, described as burning, throughout session, causing patient to cry out x2. Patient unable to rate pain at this time. She was pre-medicated by RN and PT provided relaxation, repositioning, distraction, and rest breaks as pain interventions throughout session.   Therapeutic Activity: Bed Mobility: Patient performed rolling R and L and supine to/from sit with mod A. Provided verbal cues for log roll technique to maintain spinal precautions. Transfers: Patient performed stand pivot transfers to/from w/c with mod A with B HHA. Provided verbal cues for forward weight shift with a straight back to maintain spinal precautions. Patient sat in w/c to eat lunch, patient took 2 bites of chicken and 1 bite of potatoes and claimed she could not eat any more due to her R back and hip pain and that she needed to lie back down. PT provided repositioning and relaxation techniques, including diaphragmatic breathing, without any relief. Patient requested to lie back down and she was assisted back to bed as described above.   Therapeutic Exercise: Patient performed the following exercises with verbal and tactile cues for proper  technique. Patient unable to tolerate lying supine initially, but agreeable to exercises in L sidelying.  Performed B knee and hip flexion/extension in side lying for increased ROM and control of movement 2x10. Patient unable to tolerate hip abducting in side lying due to R hip pain.   Patient in supine in the bed with a pillow under he knees for comfort and her husband at bedside at end of session with breaks locked, bed alarm set, and all needs within reach. Discussed POC with patient's husband, patient was already asleep. He stated that he has started to look for additional resources for assistance with caring for his wife at home. He also provided a copy of a letter to Dr. Posey Pronto about his thoughts and concerns about her progress and questions about how to proceed with helping her progress with therapies, which has been limited by severe anxiety due to fear of falling.     Therapy Documentation Precautions:  Precautions Precautions: Back, Fall Precaution Booklet Issued: No Precaution Comments: reviewed back precautions Required Braces or Orthoses: Spinal Brace Spinal Brace: Applied in sitting position, Lumbar corset Restrictions Weight Bearing Restrictions: No   Therapy/Group: Individual Therapy  Dejion Grillo L Afsa Meany PT, DPT  04/26/2019, 4:10 PM

## 2019-04-26 NOTE — Progress Notes (Signed)
Social Work Patient ID: Tiffany Gill, female   DOB: 04/20/1941, 78 y.o.   MRN: [id number]   CSW met with pt and her husband on 04-23-19 to update them on team conference discussion and about d/c date.  Pt's husband is concerned that pt will not be ready next week.  CSW told him that the team will re-evaluate once we try the medication changes, husband being here for therapy appointments, and phone appt CSW set up with pt's psychologist, Dr. Cheryln Manly on 04-25-19.  Husband thought this was a good plan.  On 04-25-19, therapy team came to [place] stating their concern that pt will not reach goals by 04-29-19.  CSW spoke with Dr. Posey Pronto about this and d/c date will be taken off calendar for now and team will re-evaluate once all of the above is in place, including Dr. Cheryln Manly talking with Dr. Posey Pronto and Dr. Sima Matas. CSW will continue to follow and assist as needed.

## 2019-04-26 NOTE — Progress Notes (Signed)
Occupational Therapy Session Note  Patient Details  Name: Tiffany Gill MRN: [id number] Date of Birth: 03/20/1941  Today's Date: 04/26/2019 OT Individual Time: 1125-1207 OT Individual Time Calculation (min): 42 min    Short Term Goals: Week 2:  OT Short Term Goal 1 (Week 2): STG=LTG secondary to ELOS (LTG downgraded)  Skilled Therapeutic Interventions/Progress Updates:    Pt received supine in bed, husband Dennis present. Pt very difficult to arouse, requiring grading of cues to increase alertness without startling pt. Pt eventually was able to maintain alertness and environmental factors were adjusted, I.e. lights increased and volume of voice increased. Pt VERY anxious and resistant to any movement. LPN entered room to administer pain medication. Pt was able to take medication and tolerate bed in more upright position. Pt completed rolling R, reaching across to bedrail with (S). Attempted to help bend pt's R knee but pt was unable to tolerate 2/2 pain. Pt rolled L, achieving full sidelying position with (S) and heavy cueing. Edu/encouragemnet provided throughout session re back precautions, pain management, and importance of OOB mobility. Pt transitioned to sitting EOB with increased time and cueing, min A overall. Pt tolerate sitting for 4 minutes before returning to supine. Pt brushed teeth while supine with set up assist. Pt was left with all needs met, Dennis still present.   Therapy Documentation Precautions:  Precautions Precautions: Back, Fall Precaution Booklet Issued: No Precaution Comments: reviewed back precautions Required Braces or Orthoses: Spinal Brace Spinal Brace: Applied in sitting position, Lumbar corset Restrictions Weight Bearing Restrictions: No  Therapy/Group: Individual Therapy   H  04/26/2019, 6:52 AM 

## 2019-04-27 ENCOUNTER — Inpatient Hospital Stay (HOSPITAL_COMMUNITY): Payer: Medicare Other

## 2019-04-27 DIAGNOSIS — F419 Anxiety disorder, unspecified: Secondary | ICD-10-CM

## 2019-04-27 MED ORDER — HEPARIN SOD (PORK) LOCK FLUSH 100 UNIT/ML IV SOLN
500.0000 [IU] | INTRAVENOUS | Status: AC | PRN
  Administered 2019-04-27: 09:00:00 500 [IU]

## 2019-04-27 NOTE — Progress Notes (Addendum)
Physical Therapy Session Note  Patient Details  Name: Tiffany Gill MRN: [id number] Date of Birth: 12/14/1940  Today's Date: 04/27/2019 PT Individual Time: 1300-1400; 1500-1508 PT Individual Time Calculation (min): 60 min , 8 min  Short Term Goals:  Week 2:  PT Short Term Goal 1 (Week 2): Patient will perform bed mobility with min A. PT Short Term Goal 2 (Week 2): Patient will perform basic transfers with min A using LRAD. PT Short Term Goal 3 (Week 2): Patient will propel w/c >30 feet. PT Short Term Goal 4 (Week 2): Patient will re-initiate gait training >10 feet using LRAD. Week 3:     Skilled Therapeutic Interventions/Progress Updates:   tx 1:  Pt resting in bed.  Husband present.  Anxiety significanlty decreased since PT saw this pt on 7/30.  Rolling R (log) without use of rails, CGA.  L side lying> sit with mod assist.  Pt initiated standing , max assist for stand pivot transfer to w/c.  W/c propulsion using bil UEs x 30' with supervision.  Seated Therapeutic exercise performed with LE to increase strength for functional mobility.:  10 x 1 R/L ankle pumps, R/L long arc quad knee extensions, resisted bil hip adduction.    Without back support, pt folded towels x 5 minutes using bil hands, at a table.  Gait training with RW on level tile, x 4', x 15' with mod assist for balance and intermittently RLE progression.   neuromuscular re-education via forced use, multimodal cues for alternating reciprocal movement bil LEs from w/c level, using KInetron at resistance 70, x 15 cycles x 5.  At end of session, pt resting inw/c with seat belt alarm set and needs at hand; husband present.  tx 2:  Husband requested that PT get pt back to bed.  Stand pivot with max assist to sit EOB.  Min assist for RLE for sit> supine.  Pt resting with needs at hand and bed alarm set at end of tx.     Therapy Documentation Precautions:  Precautions Precautions: Back, Fall Precaution Booklet  Issued: No Precaution Comments: reviewed back precautions Required Braces or Orthoses: Spinal Brace Spinal Brace: Applied in sitting position, Lumbar corset Restrictions Weight Bearing Restrictions: No  Pain: tx 1-  6/10 R hip/low back.  Premedicated.        Therapy/Group: Individual Therapy  Jayziah Bankhead 04/27/2019, 2:40 PM

## 2019-04-27 NOTE — Progress Notes (Signed)
[facility] PHYSICAL MEDICINE & REHABILITATION PROGRESS NOTE  Subjective/Complaints: Patient seen laying in bed this morning.  She slept well overnight per sleep chart, confirmed with patient.  She appears to be more calm this morning.  She denies pain.  ROS: Denies CP SOB, N/V/D  Objective:   Dg Lumbar Spine Complete  Result Date: 04/26/2019 CLINICAL DATA:  Functional deficits secondary to Lumbar radiculopathy with LLE weakness. Pt having lumbar pain. EXAM: LUMBAR SPINE - COMPLETE 4+ VIEW COMPARISON:  CT 04/07/2019 FINDINGS: Interval revision of posterior fusion hardware L4-5 and removal of interbody cages, with bilateral posterior pedicle screws and vertical interconnecting rods L3-S1. Stable anterolisthesis L4-5. Chronic mild T11 and moderate T12 compression deformities. Extensive aortic calcifications without suggestion of aneurysm. Bilateral pelvic phleboliths. Cholecystectomy clips. IMPRESSION: 1. Postop changes L3-S1 without apparent complication. 2. Old T11 and T12 compression deformities.  No acute fracture. 3.  Aortic Atherosclerosis (ICD10-170.0). Electronically Signed   By: Lucrezia Europe M.D.   On: 04/26/2019 13:32   Recent Labs    04/25/19 0745  WBC 8.7  HGB 9.6*  HCT 30.5*  PLT 406*   No results for input(s): NA, K, CL, CO2, GLUCOSE, BUN, CREATININE, CALCIUM in the last 72 hours.  Intake/Output Summary (Last 24 hours) at 04/27/2019 1021 Last data filed at 04/26/2019 2214 Gross per 24 hour  Intake 462 ml  Output -  Net 462 ml     Physical Exam: Vital Signs Blood pressure 134/71, pulse 93, temperature (!) 97.4 F (36.3 C), temperature source Oral, resp. rate 19, height 5' (1.524 m), weight 53.1 kg, SpO2 95 %. Constitutional: NAD.  Vital signs reviewed. HENT: Normocephalic.  Atraumatic. Eyes: EOMI.  No discharge. Cardiovascular: No JVD. Respiratory: Normal effort. GI: Non-distended. Musc: No edema or tenderness in extremities. Neurologic: Alert Motor: 4+/5 grossly  throughout, and patient provides full participation Skin: Distal dehiscence of incision, stable Psych: Anxious  Assessment/Plan: 1. Functional deficits secondary to Lumbar radiculopathy with LLE weakness which require 3+ hours per day of interdisciplinary therapy in a comprehensive inpatient rehab setting.  Physiatrist is providing close team supervision and 24 hour management of active medical problems listed below.  Physiatrist and rehab team continue to assess barriers to discharge/monitor patient progress toward functional and medical goals  Care Tool:  Bathing    Body parts bathed by patient: Right arm, Left arm, Chest, Abdomen, Right upper leg, Left upper leg   Body parts bathed by helper: Front perineal area, Buttocks, Right lower leg, Left lower leg     Bathing assist Assist Level: Maximal Assistance - Patient 24 - 49%     Upper Body Dressing/Undressing Upper body dressing   What is the patient wearing?: Pull over shirt    Upper body assist Assist Level: Contact Guard/Touching assist    Lower Body Dressing/Undressing Lower body dressing      What is the patient wearing?: Incontinence brief, Pants     Lower body assist Assist for lower body dressing: Total Assistance - Patient < 25%     Toileting Toileting    Toileting assist Assist for toileting: Maximal Assistance - Patient 25 - 49%     Transfers Chair/bed transfer  Transfers assist     Chair/bed transfer assist level: Moderate Assistance - Patient 50 - 74%     Locomotion Ambulation   Ambulation assist      Assist level: 2 helpers(mod A with w/c follow for safety) Assistive device: Walker-rolling Max distance: 5   Walk 10 feet activity  Assist     Assist level: Moderate Assistance - Patient - 50 - 74% Assistive device: Walker-rolling   Walk 50 feet activity   Assist Walk 50 feet with 2 turns activity did not occur: Safety/medical concerns(pain)         Walk 150 feet  activity   Assist Walk 150 feet activity did not occur: Safety/medical concerns         Walk 10 feet on uneven surface  activity   Assist Walk 10 feet on uneven surfaces activity did not occur: Safety/medical concerns         Wheelchair     Assist Will patient use wheelchair at discharge?: Yes Type of Wheelchair: Manual    Wheelchair assist level: Contact Guard/Touching assist Max wheelchair distance: 10    Wheelchair 50 feet with 2 turns activity    Assist        Assist Level: Minimal Assistance - Patient > 75%   Wheelchair 150 feet activity     Assist Wheelchair 150 feet activity did not occur: Safety/medical concerns          Medical Problem List and Plan: 1.Functional deficits and lower extremity weaknesssecondary to 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  Continue CIR  Back x-ray personally reviewed, stable. 2. Antithrombotics: -DVT/anticoagulation:Pharmaceutical:Heparin -antiplatelet therapy: N/A 3. Pain Management: Oxycodone PRN  Continue lidocaine patch to back, ordered for right hip as well with benefit. 4. Mood:Team to provide ego support. LCSW to follow for evaluation and support when appropriate. -antipsychotic agents: Continue Abilify. 5. Neuropsych: This patientis not fully capable of making decisions onherown behalf.  Continue tele-sitter for safety  Discussed anxiety with Neuropsych 6. Skin/Wound Care:Routine pressure relief measures.  Evaluated by neurosurgery- suggesting superficial dehiscence  Wound culture ordered, remains pending and antibiotics already started  Started on empiric Keflex given change in mental status, equivocal UA, and wound drainage 7. Fluids/Electrolytes/Nutrition:Monitor I/O.  8. HTN: Monitor BP tid--continue Cozaar and Norvasc.  Vitals:   04/26/19 1922  04/27/19 0554  BP: 140/78 134/71  Pulse: 87 93  Resp: 18 19  Temp: (!) 97.5 F (36.4 C) (!) 97.4 F (36.3 C)  SpO2: 95% 95%   Labile on 8/2 9.  Likely CKD:SCr was around 1.0 prior to admission.  Creatinine 1.02 on 7/2, labs ordered for tomorrow  Continue to monitor 10. Delirium: Keep lights on during the day --monitor sleep wake cycle and set schedule. Limit cognitively altering medications as much as possible 11. MDD with severe anxiety:Was undergoing ECT at [facility] every 3 months (prior to start of pandemic) Continue Abilify, Cymbaltaand Wellbutrin  Klonopin changed to scheduled 0.25 with breakfast and lunch  Discussed use of aroma therapy with nursing  See #4  Team to provide support with reassurance  12. ABLA: Monitor for signs of bleeding.   Hemoglobin 9.6 on 7/31  Hemoccult negative again  Continue to monitor 13. Leucocytosis: Resolved 14. Abnormal UA  Equivocal UA  Ucx with multiple species 15. Sleep disturbance  Melatonin started on 7/30, increased on 7/31  Improving  Consider Ambien if necessary  LOS: 13 days A FACE TO FACE EVALUATION WAS PERFORMED   Lorie Phenix 04/27/2019, 10:21 AM

## 2019-04-27 NOTE — Plan of Care (Signed)
  Problem: Consults Goal: RH SPINAL CORD INJURY PATIENT EDUCATION Description:  See Patient Education module for education specifics.  Outcome: Progressing   Problem: SCI BOWEL ELIMINATION Goal: RH STG MANAGE BOWEL WITH ASSISTANCE Description: STG Manage Bowel with min. Assistance. Outcome: Progressing   Problem: SCI BLADDER ELIMINATION Goal: RH STG MANAGE BLADDER WITH ASSISTANCE Description: STG Manage Bladder With min. Assistance Outcome: Progressing   Problem: RH SKIN INTEGRITY Goal: RH STG SKIN FREE OF INFECTION/BREAKDOWN Description: With min. assist Outcome: Progressing Goal: RH STG MAINTAIN SKIN INTEGRITY WITH ASSISTANCE Description: STG Maintain Skin Integrity With min. Assistance. Outcome: Progressing   Problem: RH SAFETY Goal: RH STG ADHERE TO SAFETY PRECAUTIONS W/ASSISTANCE/DEVICE Description: STG Adhere to Safety Precautions With cues and reminder min Assistance/Device. Outcome: Progressing

## 2019-04-27 NOTE — Progress Notes (Addendum)
Patient slept for 9 hours this shift. Medicated x2 with oxycodone 10 mg po for c/o back pain-patient noted yelling out at times, resting quietly when reassessed after pain med administered. CHG bath completed. Vital signs stable.

## 2019-04-28 ENCOUNTER — Inpatient Hospital Stay (HOSPITAL_COMMUNITY): Payer: Medicare Other

## 2019-04-28 DIAGNOSIS — R8290 Unspecified abnormal findings in urine: Secondary | ICD-10-CM

## 2019-04-28 DIAGNOSIS — F419 Anxiety disorder, unspecified: Secondary | ICD-10-CM

## 2019-04-28 LAB — CBC
HCT: 33.8 % — ABNORMAL LOW (ref 36.0–46.0)
Hemoglobin: 10.5 g/dL — ABNORMAL LOW (ref 12.0–15.0)
MCH: 31.6 pg (ref 26.0–34.0)
MCHC: 31.1 g/dL (ref 30.0–36.0)
MCV: 101.8 fL — ABNORMAL HIGH (ref 80.0–100.0)
Platelets: 338 10*3/uL (ref 150–400)
RBC: 3.32 MIL/uL — ABNORMAL LOW (ref 3.87–5.11)
RDW: 14.1 % (ref 11.5–15.5)
WBC: 7.7 10*3/uL (ref 4.0–10.5)
nRBC: 0 % (ref 0.0–0.2)

## 2019-04-28 LAB — BASIC METABOLIC PANEL
Anion gap: 10 (ref 5–15)
BUN: 28 mg/dL — ABNORMAL HIGH (ref 8–23)
CO2: 22 mmol/L (ref 22–32)
Calcium: 9.6 mg/dL (ref 8.9–10.3)
Chloride: 106 mmol/L (ref 98–111)
Creatinine, Ser: 1.07 mg/dL — ABNORMAL HIGH (ref 0.44–1.00)
GFR calc Af Amer: 58 mL/min — ABNORMAL LOW (ref >60)
GFR calc non Af Amer: 50 mL/min — ABNORMAL LOW (ref >60)
Glucose, Bld: 87 mg/dL (ref 70–99)
Potassium: 2.9 mmol/L — ABNORMAL LOW (ref 3.5–5.1)
Sodium: 138 mmol/L (ref 135–145)

## 2019-04-28 NOTE — Progress Notes (Signed)
Physical Therapy Session Note  Patient Details  Name: Tiffany Gill MRN: [id number] Date of Birth: May 27, 1941  Today's Date: 04/28/2019 PT Individual Time: 1305-1405 PT Individual Time Calculation (min): 60 min   Short Term Goals: Week 2:  PT Short Term Goal 1 (Week 2): Patient will perform bed mobility with min A. PT Short Term Goal 2 (Week 2): Patient will perform basic transfers with min A using LRAD. PT Short Term Goal 3 (Week 2): Patient will propel w/c >30 feet. PT Short Term Goal 4 (Week 2): Patient will re-initiate gait training >10 feet using LRAD.  Skilled Therapeutic Interventions/Progress Updates:     Patient in bed with husband at bedside upon PT arrival. Patient alert and agreeable to PT session. Per discussion with Pam, PA, patient went down for an x-ray for her R hip due to increased R hip pain this morning, bed level exercises only with therapies until x-ray results come back.   Therapeutic Activity: Bed Mobility: Patient performed rolling L with min A and scooting up in bed with total A of 2 people during session. Provided verbal cues for reaching with opposite arm to the bed rail and bending opposite knee to assist with rolling.  Therapeutic Exercise: Patient performed the following exercises with verbal and tactile cues for proper technique. -B ankle pumps 2x10 -B glut sets 2x10 with 5 sec hold -B heel slides 2x10 with resistance on L -R SLR 2x10, increased pain on L x1 -B shoulder flexion with 1 lb dowel rod 2x10 -B chest press with 1 lb dowel rod 2x10  Patient in bed at end of session with breaks locked, bed alarm set, and all needs within reach. Educated patient and husband on holding POC decisions until x-ray results and MD's orders related to patient's hip pain. Patient and her husband seem excited about patient's progress over the weekend prior to the hip pain. Attempted to sit patient up in bed to finish lunch, patient unable to tolerate the HOB elevated to  >10 degrees due to increased R hip pain. Educated on risk of choking/asperation when eating in a lying position.    Therapy Documentation Precautions:  Precautions Precautions: Back, Fall Precaution Booklet Issued: No Precaution Comments: reviewed back precautions Required Braces or Orthoses: Spinal Brace Spinal Brace: Applied in sitting position, Lumbar corset Restrictions Weight Bearing Restrictions: No Pain: Patient reported 4/10 R hip pain, pointing to lateral gluteal region. RN provided pain medicine during session and PT provided repositioning, distraction, and relaxation as pain interventions during session.    Therapy/Group: Individual Therapy  [name] PT, DPT  04/28/2019, 3:58 PM

## 2019-04-28 NOTE — Progress Notes (Signed)
[place] PHYSICAL MEDICINE & REHABILITATION PROGRESS NOTE  Subjective/Complaints: Patient seen laying in bed this morning.  She slept well overnight per sleep chart.  She is resting comfortably, but states that she has pain in her leg this a.m.  Discussed x-rays with patient.  ROS: + Right leg pain.  Denies CP SOB, N/V/D  Objective:   Dg Lumbar Spine Complete  Result Date: 04/26/2019 CLINICAL DATA:  Functional deficits secondary to Lumbar radiculopathy with LLE weakness. Pt having lumbar pain. EXAM: LUMBAR SPINE - COMPLETE 4+ VIEW COMPARISON:  CT 04/07/2019 FINDINGS: Interval revision of posterior fusion hardware L4-5 and removal of interbody cages, with bilateral posterior pedicle screws and vertical interconnecting rods L3-S1. Stable anterolisthesis L4-5. Chronic mild T11 and moderate T12 compression deformities. Extensive aortic calcifications without suggestion of aneurysm. Bilateral pelvic phleboliths. Cholecystectomy clips. IMPRESSION: 1. Postop changes L3-S1 without apparent complication. 2. Old T11 and T12 compression deformities.  No acute fracture. 3.  Aortic Atherosclerosis (ICD10-170.0). Electronically Signed   By: Lucrezia Europe M.D.   On: 04/26/2019 13:32   Recent Labs    04/28/19 0600  WBC 7.7  HGB 10.5*  HCT 33.8*  PLT 338   Recent Labs    04/28/19 0600  NA 138  K 2.9*  CL 106  CO2 22  GLUCOSE 87  BUN 28*  CREATININE 1.07*  CALCIUM 9.6    Intake/Output Summary (Last 24 hours) at 04/28/2019 1028 Last data filed at 04/28/2019 0900 Gross per 24 hour  Intake 360 ml  Output -  Net 360 ml     Physical Exam: Vital Signs Blood pressure 129/66, pulse 98, temperature 98.5 F (36.9 C), temperature source Oral, resp. rate 18, height 5' (1.524 m), weight 53.1 kg, SpO2 95 %. Constitutional: NAD.  Vital signs reviewed. HENT: Normocephalic.  Atraumatic. Eyes: EOMI.  No discharge. Cardiovascular: No JVD. Respiratory: Normal effort. GI: Non-distended. Musc: No edema or  tenderness in extremities. Neurologic: Alert Motor: 4+/5 grossly throughout, when patient provides full participation, right lower extremity limited this a.m. Skin: Distal dehiscence of incision, unchanged Psych: Anxious  Assessment/Plan: 1. Functional deficits secondary to Lumbar radiculopathy with LLE weakness which require 3+ hours per day of interdisciplinary therapy in a comprehensive inpatient rehab setting.  Physiatrist is providing close team supervision and 24 hour management of active medical problems listed below.  Physiatrist and rehab team continue to assess barriers to discharge/monitor patient progress toward functional and medical goals  Care Tool:  Bathing    Body parts bathed by patient: Right arm, Left arm, Chest, Abdomen, Right upper leg, Left upper leg   Body parts bathed by helper: Front perineal area, Buttocks, Right lower leg, Left lower leg     Bathing assist Assist Level: Maximal Assistance - Patient 24 - 49%     Upper Body Dressing/Undressing Upper body dressing   What is the patient wearing?: Pull over shirt    Upper body assist Assist Level: Contact Guard/Touching assist    Lower Body Dressing/Undressing Lower body dressing      What is the patient wearing?: Incontinence brief, Pants     Lower body assist Assist for lower body dressing: Total Assistance - Patient < 25%     Toileting Toileting    Toileting assist Assist for toileting: Maximal Assistance - Patient 25 - 49%     Transfers Chair/bed transfer  Transfers assist     Chair/bed transfer assist level: Maximal Assistance - Patient 25 - 49%     Locomotion Ambulation  Ambulation assist      Assist level: Moderate Assistance - Patient 50 - 74% Assistive device: Walker-rolling Max distance: 15   Walk 10 feet activity   Assist     Assist level: Moderate Assistance - Patient - 50 - 74% Assistive device: Walker-rolling   Walk 50 feet activity   Assist Walk 50  feet with 2 turns activity did not occur: Safety/medical concerns(pain)         Walk 150 feet activity   Assist Walk 150 feet activity did not occur: Safety/medical concerns         Walk 10 feet on uneven surface  activity   Assist Walk 10 feet on uneven surfaces activity did not occur: Safety/medical concerns         Wheelchair     Assist Will patient use wheelchair at discharge?: Yes Type of Wheelchair: Manual    Wheelchair assist level: Supervision/Verbal cueing Max wheelchair distance: 30    Wheelchair 50 feet with 2 turns activity    Assist        Assist Level: Minimal Assistance - Patient > 75%   Wheelchair 150 feet activity     Assist Wheelchair 150 feet activity did not occur: Safety/medical concerns          Medical Problem List and Plan: 1.Functional deficits and lower extremity weaknesssecondary to 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  Continue CIR  Back x-ray personally reviewed, stable. 2. Antithrombotics: -DVT/anticoagulation:Pharmaceutical:Heparin -antiplatelet therapy: N/A 3. Pain Management: Oxycodone PRN  Continue lidocaine patch to back, ordered for right hip as well with benefit.  Significantly confounded by anxiety 4. Mood:Team to provide ego support. LCSW to follow for evaluation and support when appropriate. -antipsychotic agents: Continue Abilify.  Discussed anxiety with neuropsychology-we will likely continue to have anxiety while inpatient. 5. Neuropsych: This patientis not fully capable of making decisions onherown behalf.  Continue tele-sitter for safety  Discussed anxiety with Neuropsych 6. Skin/Wound Care:Routine pressure relief measures.  Evaluated by neurosurgery- suggesting superficial dehiscence  Wound culture ordered, remains pending and antibiotics already  started  Continue empiric Keflex given change in mental status, equivocal UA, and wound drainage (started on 7/31) 7. Fluids/Electrolytes/Nutrition:Monitor I/O.  8. HTN: Monitor BP tid--continue Cozaar and Norvasc.  Vitals:   04/28/19 0510 04/28/19 0512  BP: 127/71 129/66  Pulse: 98   Resp: 18   Temp: 98.5 F (36.9 C)   SpO2: 95%    Stable on 8/3 9.  Likely CKD:SCr was around 1.0 prior to admission.  Creatinine 1.07 on 8/3  Continue to monitor 10. Delirium: Keep lights on during the day --monitor sleep wake cycle and set schedule. Limit cognitively altering medications as much as possible 11. MDD with severe anxiety:Was undergoing ECT at [facility] every 3 months (prior to start of pandemic) Continue Abilify, Cymbaltaand Wellbutrin  Klonopin changed to scheduled 0.25 with breakfast and lunch  Discussed use of aroma therapy with nursing  See #4  Team to provide support with reassurance  12. ABLA: Monitor for signs of bleeding.   Hemoglobin 10.5 on 8/3  Hemoccult negative again  Continue to monitor 13. Leucocytosis: Resolved 14. Abnormal UA  Equivocal UA  Ucx with multiple species 15. Sleep disturbance  Melatonin started on 7/30, increased on 7/31  Improving overall  Consider Ambien if necessary  LOS: 14 days A FACE TO FACE EVALUATION WAS PERFORMED  Anda Sobotta Lorie Phenix 04/28/2019, 10:28 AM

## 2019-04-28 NOTE — Progress Notes (Signed)
Occupational Therapy Session Note  Patient Details  Name: Tiffany Gill MRN: [id number] Date of Birth: 1941/05/04  Today's Date: 04/28/2019 OT Individual Time: 1045-1130 OT Individual Time Calculation (min): 45 min  and Today's Date: 04/28/2019 OT Missed Time: 30 Minutes Missed Time Reason: Pain   Short Term Goals: Week 2:  OT Short Term Goal 1 (Week 2): STG=LTG secondary to ELOS (LTG downgraded)  Skilled Therapeutic Interventions/Progress Updates:    Pt asleep in bed upon arrival with husband present.  Pt awoke while conversing with husband.  Pt agreeable to getting pants on and getting OOB.  Pt required min A for supine>sit EOB and CGA for sitting balance.  Pt required tot A for donning pants prior to transfer to w/c.  Pt required tot a for donning LSO. Pt required encouragement and emotional support prior to transferring to w/c.  Pt c/o increase R hip pain while seated EOB. Pt performed stand pivot transfer to w/c with max A.  Pt did not want to try using RW for transfer.  Pt transitioned to day room for standing activities at table.  Pt c/o increased pain in R hip and became tearful with increased anxiety.  Pt returned to room and transferred back to bed.  Pt remained in bed with bed alarm activated and husband present.   Therapy Documentation Precautions:  Precautions Precautions: Back, Fall Precaution Booklet Issued: No Precaution Comments: reviewed back precautions Required Braces or Orthoses: Spinal Brace Spinal Brace: Applied in sitting position, Lumbar corset Restrictions Weight Bearing Restrictions: No General: General OT Amount of Missed Time: 30 Minutes    Pain:  Pt c/o increased pain in R hip (8/10) with increased activity and sitting in w/c   Therapy/Group: Individual Therapy  [name] 04/28/2019, 11:46 AM

## 2019-04-29 ENCOUNTER — Inpatient Hospital Stay (HOSPITAL_COMMUNITY): Payer: Medicare Other | Admitting: Rehabilitation

## 2019-04-29 ENCOUNTER — Inpatient Hospital Stay (HOSPITAL_COMMUNITY): Payer: Medicare Other

## 2019-04-29 MED ORDER — POTASSIUM CHLORIDE CRYS ER 20 MEQ PO TBCR
30.0000 meq | EXTENDED_RELEASE_TABLET | Freq: Two times a day (BID) | ORAL | Status: DC
  Administered 2019-04-29 – 2019-04-30 (×3): 30 meq via ORAL
  Filled 2019-04-29 (×3): qty 1

## 2019-04-29 NOTE — Progress Notes (Signed)
Occupational Therapy Session Note  Patient Details  Name: Tiffany Gill MRN: [id number] Date of Birth: Oct 22, 1940  Today's Date: 04/29/2019 OT Individual Time: 1100-1145 OT Individual Time Calculation (min): 45 min  and Today's Date: 04/29/2019 OT Missed Time: 15 Minutes Missed Time Reason: Pain   Short Term Goals: Week 3:  OT Short Term Goal 1 (Week 3): STG=LTG secondary to ELOS (LTG downgraded)  Skilled Therapeutic Interventions/Progress Updates:    Pt resting in bed upon arrival with husband present.  Pt initially resistant to engaging in therapy and insistently pulled covers back after pulling down.  Pt's husband selected new clothing with directions from pt.  Pt agreeable to sitting EOB to don pants and transfer to w/c.  Pt min A for sit<>stand from EOB and assisted with pulling pants over hips.  Pt required max multimodal cues for sequencing with squat pivot transfer to w/c.  Pt engaged in UB bathing/dressing tasks seated in w/c at sink.  Pt c/o increased pain in R hip while seated in w/c and became insistent on returning to bed.  Pt returned to bed.  Pt remained in bed with all needs within reach and bed alarm activated. Husband present.   Therapy Documentation Precautions:  Precautions Precautions: Back, Fall Precaution Booklet Issued: No Precaution Comments: reviewed back precautions Required Braces or Orthoses: Spinal Brace Spinal Brace: Applied in sitting position, Lumbar corset Restrictions Weight Bearing Restrictions: No General: General OT Amount of Missed Time: 15 Minutes Pain: Pain Assessment Pain Scale: 0-10 Pain Score: 8  Pain Type: Acute pain Pain Location: Hip Pain Orientation: Right Pain Descriptors / Indicators: Aching Pain Frequency: Intermittent Pain Onset: With Activity Pain Intervention(s): repositioned, emotional support  Therapy/Group: Individual Therapy  [name] 04/29/2019, 12:04 PM

## 2019-04-29 NOTE — Progress Notes (Signed)
Occupational Therapy Weekly Progress Note  Patient Details  Name: Tiffany Gill MRN: [id number] Date of Birth: Dec 04, 1940  Beginning of progress report period: April 21, 2019 End of progress report period: April 29, 2019  Pt progress and participation has been limited this past week, although improved from last week.  Pt continues to exhibit increased anxiety in anticipation and during all transitional and functional tasks.  Supine>sit EOB with min A. Sitting balance with min A.  Sit<>stand with min A. Functional transfers with min/mod A. Nursing staff is currently assisting pt with bathing/dressing tasks secondary to pt's inability to actively participate in activities.   Pt's MD has consulted with pt's psychiatrist and [facility] staff neuropsychologist to manage anxiety.   Patient continues to demonstrate the following deficits: muscle weakness, decreased cardiorespiratoy endurance, decreased initiation, decreased attention, decreased awareness, decreased problem solving, decreased safety awareness and delayed processing and decreased sitting balance, decreased standing balance, decreased balance strategies and difficulty maintaining precautions and therefore will continue to benefit from skilled OT intervention to enhance overall performance with BADL.  Patient progressing toward long term goals..  Continue plan of care.  OT Short Term Goals Week 2:  OT Short Term Goal 1 (Week 2): STG=LTG secondary to ELOS (LTG downgraded) OT Short Term Goal 1 - Progress (Week 2): Progressing toward goal Week 3:  OT Short Term Goal 1 (Week 3): STG=LTG secondary to ELOS (LTG downgraded)  [name] 04/29/2019, 6:42 AM

## 2019-04-29 NOTE — Progress Notes (Signed)
[place] PHYSICAL MEDICINE & REHABILITATION PROGRESS NOTE  Subjective/Complaints: Patient seen laying in bed this AM, she states she slept well overnight.  Husband wanted hip imaging due to pain.  Patient states she feels well, but wants to go home.   ROS: Denies CP SOB, N/V/D  Objective:   Dg Hip Unilat With Pelvis 2-3 Views Right  Result Date: 04/28/2019 CLINICAL DATA:  Right hip pain. EXAM: DG HIP (WITH OR WITHOUT PELVIS) 2-3V RIGHT COMPARISON:  None. FINDINGS: Both hips are normally located. No acute bony findings or plain film evidence of AVN. The pubic symphysis and SI joints are intact. No pelvic fractures or bone lesions. Lumbar fusion hardware is noted and appears stable. New S1 pedicle screws. IMPRESSION: No acute bony findings or significant degenerative changes. Electronically Signed   By: Marijo Sanes M.D.   On: 04/28/2019 13:12   Recent Labs    04/28/19 0600  WBC 7.7  HGB 10.5*  HCT 33.8*  PLT 338   Recent Labs    04/28/19 0600  NA 138  K 2.9*  CL 106  CO2 22  GLUCOSE 87  BUN 28*  CREATININE 1.07*  CALCIUM 9.6    Intake/Output Summary (Last 24 hours) at 04/29/2019 1053 Last data filed at 04/29/2019 0838 Gross per 24 hour  Intake 190 ml  Output -  Net 190 ml     Physical Exam: Vital Signs Blood pressure 113/69, pulse 93, temperature 98 F (36.7 C), temperature source Oral, resp. rate 18, height 5' (1.524 m), weight 53.1 kg, SpO2 100 %. Constitutional: No distress . Vital signs reviewed. HENT: Normocephalic.  Atraumatic. Eyes: EOMI. No discharge. Cardiovascular: No JVD. Respiratory: Normal effort.  No stridor. GI: Non-distended. Skin: Warm and dry.  Intact. Psych: Normal mood.  Normal behavior. Musc: No edema.  No tenderness. Neurologic: Alert Motor: 4+/5 grossly throughout, when patient provides full participation, limited in LE this AM due to participation Skin: Distal dehiscence of incision, not examined today Psych: Calm this  AM  Assessment/Plan: 1. Functional deficits secondary to Lumbar radiculopathy with LLE weakness which require 3+ hours per day of interdisciplinary therapy in a comprehensive inpatient rehab setting.  Physiatrist is providing close team supervision and 24 hour management of active medical problems listed below.  Physiatrist and rehab team continue to assess barriers to discharge/monitor patient progress toward functional and medical goals  Care Tool:  Bathing    Body parts bathed by patient: Right arm, Left arm, Chest, Abdomen, Right upper leg, Left upper leg   Body parts bathed by helper: Front perineal area, Buttocks, Right lower leg, Left lower leg     Bathing assist Assist Level: Maximal Assistance - Patient 24 - 49%     Upper Body Dressing/Undressing Upper body dressing   What is the patient wearing?: Pull over shirt    Upper body assist Assist Level: Contact Guard/Touching assist    Lower Body Dressing/Undressing Lower body dressing      What is the patient wearing?: Incontinence brief, Pants     Lower body assist Assist for lower body dressing: Total Assistance - Patient < 25%     Toileting Toileting    Toileting assist Assist for toileting: Maximal Assistance - Patient 25 - 49%     Transfers Chair/bed transfer  Transfers assist     Chair/bed transfer assist level: Maximal Assistance - Patient 25 - 49%     Locomotion Ambulation   Ambulation assist      Assist level: Moderate Assistance -  Patient 50 - 74% Assistive device: Walker-rolling Max distance: 15   Walk 10 feet activity   Assist     Assist level: Moderate Assistance - Patient - 50 - 74% Assistive device: Walker-rolling   Walk 50 feet activity   Assist Walk 50 feet with 2 turns activity did not occur: Safety/medical concerns(pain)         Walk 150 feet activity   Assist Walk 150 feet activity did not occur: Safety/medical concerns         Walk 10 feet on uneven  surface  activity   Assist Walk 10 feet on uneven surfaces activity did not occur: Safety/medical concerns         Wheelchair     Assist Will patient use wheelchair at discharge?: Yes Type of Wheelchair: Manual    Wheelchair assist level: Supervision/Verbal cueing Max wheelchair distance: 30    Wheelchair 50 feet with 2 turns activity    Assist        Assist Level: Minimal Assistance - Patient > 75%   Wheelchair 150 feet activity     Assist Wheelchair 150 feet activity did not occur: Safety/medical concerns          Medical Problem List and Plan: 1.Functional deficits and lower extremity weaknesssecondary to 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  Continue CIR  Back x-ray personally reviewed, stable.  Hip xray personally reviewed, unremarkable 2. Antithrombotics: -DVT/anticoagulation:Pharmaceutical:Heparin -antiplatelet therapy: N/A 3. Pain Management: Oxycodone PRN  Continue lidocaine patch to back, ordered for right hip as well with benefit.  Significantly confounded by anxiety, persists at times 4. Mood:Team to provide ego support. LCSW to follow for evaluation and support when appropriate. -antipsychotic agents: Continue Abilify.  Discussed anxiety with neuropsychology-we will likely continue to have anxiety while inpatient. 5. Neuropsych: This patientis not fully capable of making decisions onherown behalf.  Cont tele-sitter for safety  Discussed anxiety with Neuropsych 6. Skin/Wound Care:Routine pressure relief measures.  Evaluated by neurosurgery- suggesting superficial dehiscence  Wound culture ordered, remains pending and antibiotics already started  Continue empiric Keflex given change in mental status, equivocal UA, and wound drainage (started on 7/31) 7. Fluids/Electrolytes/Nutrition:Monitor I/O.   8. HTN: Monitor BP tid--continue Cozaar and Norvasc.  Vitals:   04/29/19 0326 04/29/19 0835  BP: 124/71 113/69  Pulse: 93   Resp: 18   Temp: 98 F (36.7 C)   SpO2: 100%    Stable on 8/4 9.  Likely CKD:SCr was around 1.0 prior to admission.  Creatinine 1.07 on 8/3  Encourage fluids  Continue to monitor 10. Delirium: Keep lights on during the day --monitor sleep wake cycle and set schedule. Limit cognitively altering medications as much as possible 11. MDD with severe anxiety:Was undergoing ECT at [facility] every 3 months (prior to start of pandemic) Continue Abilify, Cymbaltaand Wellbutrin  Klonopin changed to scheduled 0.25 with breakfast and lunch  Discussed use of aroma therapy with nursing  See #4  Team to provide support with reassurance  12. ABLA: Monitor for signs of bleeding.   Hemoglobin 10.5 on 8/3  Hemoccult negative again  Continue to monitor 13. Leucocytosis: Resolved 14. Abnormal UA  Equivocal UA  Ucx with multiple species 15. Sleep disturbance  Melatonin started on 7/30, increased on 7/31  Improving overall  Consider Ambien if necessary 16. Hypokalemia  Potassium 2.9 on 8/3, labs ordered for tomorrow  Supplement ordered x2 days  LOS: 15 days A FACE TO FACE EVALUATION WAS  PERFORMED   Lorie Phenix 04/29/2019, 10:53 AM

## 2019-04-29 NOTE — Progress Notes (Signed)
Physical Therapy Session Note  Patient Details  Name: Tiffany Gill MRN: [id number] Date of Birth: 03-Jan-1941  Today's Date: 04/29/2019 PT Individual Time: 1300-1405 PT Individual Time Calculation (min): 65 min   Short Term Goals: Week 2:  PT Short Term Goal 1 (Week 2): Patient will perform bed mobility with min A. PT Short Term Goal 2 (Week 2): Patient will perform basic transfers with min A using LRAD. PT Short Term Goal 3 (Week 2): Patient will propel w/c >30 feet. PT Short Term Goal 4 (Week 2): Patient will re-initiate gait training >10 feet using LRAD.  Skilled Therapeutic Interventions/Progress Updates:     Patient in bed with husband at bedside upon PT arrival. Patient alert and agreeable to PT session. Patient reported 7/10 R hip/buttock pain and stated she was pre-medicated prior to session. PT provided repositioning, relaxation techniques, rest breaks, and distraction as pain interventions during session.   Therapeutic Activity: Bed Mobility: Patient rolling and performed supine to/from sit with min A. Provided verbal cues for log roll technique to maintain spinal precautions. Transfers: Patient performed sit to/from stand x2 with mod A with RW and stand pivot x1 with max A from therapist and x1 with max A from patient's husband and CGA from therapist for safety. Provided verbal cues and demonstration for stand pivot transfer for patient's husband to assist with proper body mechanics and safety, provided cues for patient to push up from the bed or w/c then hold onto PT or husband's B UEs during transfer.  Gait Training:  Patient ambulated 2 feet x2, limited distance due to increase R hip pain, using RW with mod A and w/c follow. Reported increased hip pain and fear of falling during first trial. Patient provided a seated rest break with education on diaphragmatic breathing with improved pain and anxiety after. On second attempt patient cried out in pain after taking 2 small steps  and was assisted with total A to sitting in the w/c. Terminated ambulation at this time. RN made aware about patient's pain level during session. Ambulated with very small step length and narrow BOS. Provided verbal cues for erect posture and increased step length.  Wheelchair Mobility:  Patient was transported with total A in the w/c to/from the ortho gym for energy conservation and time management.   Manual Therapy: Provided soft tissue mobilization and trigger point release to R gluteal muscles with decreased pain after. Provided PROM progressing to [facility] for sciatic nerve flossing in supine with additional pain relief. Pain 6/10 after and patient lying comfortably in side lying.   Patient in bed with her husband at bedside at end of session with breaks locked, bed alarm set, and all needs within reach. Discussed patient's lack of progress with therapy and barriers to progress so far. Patient and husband agreeable to patient reaching w/c level goals in order to go home and continue to progress with home health therapies and patient's husband stated he will look in to options for building a ramp. This therapist will discuss with rehab team tomorrow in conference and stated that the patient and her husband will be updated on the patient's plan of care after.    Therapy Documentation Precautions:  Precautions Precautions: Back, Fall Precaution Booklet Issued: No Precaution Comments: reviewed back precautions Required Braces or Orthoses: Spinal Brace Spinal Brace: Applied in sitting position, Lumbar corset Restrictions Weight Bearing Restrictions: No Vital Signs: Therapy Vitals Temp: (!) 97.5 F (36.4 C) Temp Source: Oral Pulse Rate: 94 Resp: 17 BP:  101/62 Patient Position (if appropriate): Lying Oxygen Therapy SpO2: 97 % O2 Device: Room Air    Therapy/Group: Individual Therapy  Zlata Alcaide L Akylah Hascall PT, DPT  04/29/2019, 5:01 PM

## 2019-04-30 ENCOUNTER — Encounter (HOSPITAL_COMMUNITY): Payer: Medicare Other | Admitting: Psychology

## 2019-04-30 ENCOUNTER — Inpatient Hospital Stay (HOSPITAL_COMMUNITY): Payer: Medicare Other

## 2019-04-30 DIAGNOSIS — M79604 Pain in right leg: Secondary | ICD-10-CM

## 2019-04-30 LAB — BASIC METABOLIC PANEL
Anion gap: 9 (ref 5–15)
BUN: 36 mg/dL — ABNORMAL HIGH (ref 8–23)
CO2: 23 mmol/L (ref 22–32)
Calcium: 9.7 mg/dL (ref 8.9–10.3)
Chloride: 108 mmol/L (ref 98–111)
Creatinine, Ser: 1.13 mg/dL — ABNORMAL HIGH (ref 0.44–1.00)
GFR calc Af Amer: 54 mL/min — ABNORMAL LOW (ref >60)
GFR calc non Af Amer: 47 mL/min — ABNORMAL LOW (ref >60)
Glucose, Bld: 128 mg/dL — ABNORMAL HIGH (ref 70–99)
Potassium: 4.8 mmol/L (ref 3.5–5.1)
Sodium: 140 mmol/L (ref 135–145)

## 2019-04-30 MED ORDER — SODIUM CHLORIDE 0.9% FLUSH
10.0000 mL | INTRAVENOUS | Status: DC | PRN
  Administered 2019-05-02 – 2019-05-04 (×2): 10 mL
  Filled 2019-04-30 (×2): qty 40

## 2019-04-30 MED ORDER — SODIUM CHLORIDE 0.9 % IV SOLN
INTRAVENOUS | Status: AC
  Administered 2019-04-30: 20:00:00 via INTRAVENOUS

## 2019-04-30 NOTE — Progress Notes (Signed)
[facility] PHYSICAL MEDICINE & REHABILITATION PROGRESS NOTE  Subjective/Complaints: Patient seen laying in bed this AM.  She states she slept well overnight and that she wants to go home.   ROS: Denies CP SOB, N/V/D  Objective:   Dg Hip Unilat With Pelvis 2-3 Views Right  Result Date: 04/28/2019 CLINICAL DATA:  Right hip pain. EXAM: DG HIP (WITH OR WITHOUT PELVIS) 2-3V RIGHT COMPARISON:  None. FINDINGS: Both hips are normally located. No acute bony findings or plain film evidence of AVN. The pubic symphysis and SI joints are intact. No pelvic fractures or bone lesions. Lumbar fusion hardware is noted and appears stable. New S1 pedicle screws. IMPRESSION: No acute bony findings or significant degenerative changes. Electronically Signed   By: Marijo Sanes M.D.   On: 04/28/2019 13:12   Recent Labs    04/28/19 0600  WBC 7.7  HGB 10.5*  HCT 33.8*  PLT 338   Recent Labs    04/28/19 0600 04/30/19 0648  NA 138 140  K 2.9* 4.8  CL 106 108  CO2 22 23  GLUCOSE 87 128*  BUN 28* 36*  CREATININE 1.07* 1.13*  CALCIUM 9.6 9.7    Intake/Output Summary (Last 24 hours) at 04/30/2019 1049 Last data filed at 04/30/2019 0830 Gross per 24 hour  Intake 435 ml  Output 100 ml  Net 335 ml     Physical Exam: Vital Signs Blood pressure 122/68, pulse 94, temperature 97.7 F (36.5 C), temperature source Oral, resp. rate 16, height 5' (1.524 m), weight 54.9 kg, SpO2 98 %. Constitutional: No distress . Vital signs reviewed. HENT: Normocephalic.  Atraumatic. Eyes: EOMI. No discharge. Cardiovascular: No JVD. Respiratory: Normal effort.  No stridor. GI: Non-distended. Skin: Warm and dry.  Intact. Psych: Normal mood.  Normal behavior. Musc: No edema.  No tenderness. Neurologic: Alert Motor: 4-4+/5 grossly throughout, when patient provides full participation, some limited in LE today due to participation Skin: Distal dehiscence of incision, not examined today Psych: Calm this  AM  Assessment/Plan: 1. Functional deficits secondary to Lumbar radiculopathy with LLE weakness which require 3+ hours per day of interdisciplinary therapy in a comprehensive inpatient rehab setting.  Physiatrist is providing close team supervision and 24 hour management of active medical problems listed below.  Physiatrist and rehab team continue to assess barriers to discharge/monitor patient progress toward functional and medical goals  Care Tool:  Bathing    Body parts bathed by patient: Right arm, Left arm, Chest, Abdomen, Right upper leg, Left upper leg   Body parts bathed by helper: Front perineal area, Buttocks, Right lower leg, Left lower leg     Bathing assist Assist Level: Maximal Assistance - Patient 24 - 49%     Upper Body Dressing/Undressing Upper body dressing   What is the patient wearing?: Pull over shirt    Upper body assist Assist Level: Supervision/Verbal cueing    Lower Body Dressing/Undressing Lower body dressing      What is the patient wearing?: Pants     Lower body assist Assist for lower body dressing: Maximal Assistance - Patient 25 - 49%     Toileting Toileting    Toileting assist Assist for toileting: Maximal Assistance - Patient 25 - 49%     Transfers Chair/bed transfer  Transfers assist     Chair/bed transfer assist level: Maximal Assistance - Patient 25 - 49%     Locomotion Ambulation   Ambulation assist      Assist level: Moderate Assistance - Patient 50 -  74% Assistive device: Walker-rolling Max distance: 2'   Walk 10 feet activity   Assist     Assist level: Moderate Assistance - Patient - 50 - 74% Assistive device: Walker-rolling   Walk 50 feet activity   Assist Walk 50 feet with 2 turns activity did not occur: Safety/medical concerns(pain)         Walk 150 feet activity   Assist Walk 150 feet activity did not occur: Safety/medical concerns         Walk 10 feet on uneven surface   activity   Assist Walk 10 feet on uneven surfaces activity did not occur: Safety/medical concerns         Wheelchair     Assist Will patient use wheelchair at discharge?: Yes Type of Wheelchair: Manual    Wheelchair assist level: Supervision/Verbal cueing Max wheelchair distance: 30    Wheelchair 50 feet with 2 turns activity    Assist        Assist Level: Minimal Assistance - Patient > 75%   Wheelchair 150 feet activity     Assist Wheelchair 150 feet activity did not occur: Safety/medical concerns          Medical Problem List and Plan: 1.Functional deficits and lower extremity weaknesssecondary to 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  Continue CIR  Back x-ray personally reviewed, stable.  Hip xray personally reviewed, unremarkable  Will ask Neurosurg for recs for LE pain  Team conference today to discuss current and goals and coordination of care, home and environmental barriers, and discharge planning with nursing, case manager, and therapies.  2. Antithrombotics: -DVT/anticoagulation:Pharmaceutical:Heparin -antiplatelet therapy: N/A 3. Pain Management: Oxycodone PRN  Continue lidocaine patch to back, ordered for right hip as well with benefit.  Significantly confounded by anxiety, persists at times 4. Mood:Team to provide ego support. LCSW to follow for evaluation and support when appropriate. -antipsychotic agents: Continue Abilify.  Discussed anxiety with neuropsychology-we will likely continue to have anxiety while inpatient.  5. Neuropsych: This patientis not fully capable of making decisions onherown behalf.  Cont tele-sitter for safety  Discussed anxiety with Neuropsych 6. Skin/Wound Care:Routine pressure relief measures.  Evaluated by neurosurgery- suggesting superficial dehiscence  Wound culture  ordered, remains pending and antibiotics already started  Continue empiric Keflex given change in mental status, equivocal UA, and wound drainage (started on 7/31) 7. Fluids/Electrolytes/Nutrition:Monitor I/O.  8. HTN: Monitor BP tid--continue Cozaar and Norvasc.  Vitals:   04/29/19 1956 04/30/19 0554  BP: 110/73 122/68  Pulse: 96 94  Resp: 14 16  Temp: 97.8 F (36.6 C) 97.7 F (36.5 C)  SpO2: 95% 98%   Stable on 8/4 9.  Likely CKD:SCr was around 1.0 prior to admission.  Creatinine 1.13 on 8/5, labs ordered for tomorrow  IVF x1 night ordered  Encourage fluids  Continue to monitor 10. Delirium: Keep lights on during the day --monitor sleep wake cycle and set schedule. Limit cognitively altering medications as much as possible 11. MDD with severe anxiety:Was undergoing ECT at [facility] every 3 months (prior to start of pandemic) Continue Abilify, Cymbaltaand Wellbutrin  Klonopin changed to scheduled 0.25 with breakfast and lunch  Discussed use of aroma therapy with nursing  See #4  Team to provide support with reassurance  12. ABLA: Monitor for signs of bleeding.   Hemoglobin 10.5 on 8/3  Hemoccult negative again  Continue to monitor 13. Leucocytosis: Resolved 14. Abnormal UA  Equivocal UA  Ucx with multiple  species 15. Sleep disturbance  Melatonin started on 7/30, increased on 7/31  Improving overall  Consider Ambien if necessary 16. Hypokalemia  Potassium 4.8 on 8/5  Supplement d/ced  LOS: 16 days A FACE TO [place] 04/30/2019, 10:49 AM

## 2019-04-30 NOTE — Progress Notes (Unsigned)
Was called about patient having some right hip pain still. She states that this is the same pain as before her second surgery but not having back spasms anymore. Says that the pain is worse at night. I would recommend putting her on some Decadron 4mg  Q8 to see if this helps her pain. Lumbar xrays reviewed from 3 days ago and I do not see any complicating features that concern me. Please call if I can be of further assitance

## 2019-04-30 NOTE — Consult Note (Signed)
Neuropsychological Consultation   Patient:   Tiffany Gill   DOB:   04-12-1941  MR Number:  [id number]  Location:  [facility] [address] C[address] [place] [id number] [place] [address] Dept: [place]: [phone]           Date of Service:   04/30/2019  Start Time:   2 PM End Time:   3 PM  Provider/Observer:  Ilean Skill, Psy.D.       Clinical Neuropsychologist       Billing Code/Service: [phone], [phone]  Chief Complaint:    Tiffany Gill is a 78 year old female with history of HTN, MDD, memory loss, LBP due to spondylolisthesis with facet arthropathy and severe spinal stenosis affecting L4 and L5 nerve roots.  Patient underwent decompressive laminectomy with fusion on 03/17/2019.  Patient had worsening of pain and admitted on 6/29 for workup and pain management.  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  Patient had fall on 7/4 stricking head with occipital laceration.  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  Patient continued to have issues with delirium and pain and follow-up MRI on 7/8 showed severe changes in L area and patient returned to OR for re-exploration and removal of residual spinous process and redo screw fixation.  Great improvement in pain post op.    Patient has long history of severe recurrent depression with recurring ECT at [facility].  Her last ECT according to my review of med records was Feb 2020.   Patient reports that she been through many psychotropic trials in past with varying levels of success.  04/23/2019: Today was a follow-up visit with the patient.  The patient was also present in the room with her husband as well.  The patient described it appeared to be displaying increased symptoms of anxiety since I saw her previously.  The patient's husband reports that he is noting more anxiety and was  concerned that it may be related to medication changes.  The patient does have a long history of significant depression anxiety that is been treated previously with ECT and the patient has not been able to have her regular ECT treatments initially due to [place] 19 restrictions at her providers office but now due to residual effects of her recent decompressive laminectomy surgery in June.  04/30/19:  Patient with reports of significant reduction in anxiety, which was likely exacerbated by UTI.  Patient's husband was in room and reports his perception is significant reduction in anxiety.  Wife does want consultation with her local psychiatrist.  Patient is likely to be discharged this Sunday, so I will not be able to see her again before discharge.    Reason for Service:  Patient was referred for neuropsycholgocal consulation due to coping and adjustment issues and monitor improving Mental Status and cognition.  Below is the HPI for the current admission.  Tiffany Gill is a 78 year old female with history of HTN, MDD, memory loss, LBP radiating to LLE>RLE due to spondylolisthesis with facet arthropathy and severe spinal stenosis affecting L4 and L5 nerve roots s/p 20 for L4-L5 decompressive laminectomy with fusion 03/17/19 who started developing progressive LBP and hip pain with follow up X rays in office revealing subsistence of spacer. She was admitted on 03/24/19 for work up and pain management. CT spine done revealing subsidence of right spacer into superior endplate of L5  but overall stable hardware. 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 She did sustained a fall striking her head on 07/04 with subsequent occipital laceration. 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 CT left hip was negative for fracture.    She continued to have issues with delirium and buttock pain therefore hospitalist were consulted for input she was treated with fluid bolus with improvement in renal status and mentation. They recommended MRI lumbar spine which was done 04/02/19 showing severe thecal sac effacement L4/L5 due to listhesis with epidural collection at L5 and subdural collection from L2-L4 with displacement. Head CT repeated and was negative for bleed. Medications adjusted to help with pain management but she continued to have pain and anxiety limiting overall mobility. After discussion with husband, she was taken by to OR for re-exploration of L4/L5 with removal of residual spinous process and redo foraminotomies with screw fixation L3-S1 on 04/08/19 by Dr. Saintclair Halsted. Post op, she has had great improvement in pain and mild left foot drop reported to be stable. MD recommended CIR due to functional decline.   Current Status:  The patient reports increased anxiety and worry but no significant worsening of her depressive symptoms.  The patient is having regular visits by her husband and she feels like this is helpful.  Her husband was very active in his questions today with his efforts trying to figure out some of the etiological factors that may be playing a role in her increased anxiety.  Behavioral Observation: Tiffany Gill  presents as a 78 y.o.-year-old Right Caucasian Female who appeared her stated age. her dress was Appropriate and she was Well Groomed and her manners were Appropriate to the situation.  her participation was indicative of Appropriate and Redirectable behaviors.  There were any physical disabilities noted.  she displayed an appropriate level of cooperation and motivation.     Interactions:    Active Appropriate and Redirectable  Attention:   abnormal and attention span appeared shorter than expected for age  Memory:   abnormal; remote memory intact, recent memory impaired  Visuo-spatial:  not  examined  Speech (Volume):  normal  Speech:   normal; normal  Thought Process:  Coherent and Tangential  Though Content:  WNL; not suicidal and not homicidal  Orientation:   person, place, time/date and situation  Judgment:   Fair  Planning:   Fair  Affect:    Anxious  Mood:    Anxious  Insight:   Fair  Intelligence:   normal  Medical History:   Past Medical History:  Diagnosis Date  . Anxiety   . Gait disorder   . High cholesterol   . Hypertension   . Hypothyroidism   . Major depression in partial remission ([place])   . Memory loss   . Osteoarthritis         Psychiatric History:  Patient does have past history of depression and anxiety disorder.  She reports that she is dealing with more anxiety now than depressive symptoms.    Family Med/Psych History:  Family History  Problem Relation Age of Onset  . Stroke Mother   . Heart disease Father   . Heart disease Brother    Impression/DX:  Tiffany Gill is a 78 year old female with history of HTN, MDD, memory loss, LBP due to spondylolisthesis with facet arthropathy  and severe spinal stenosis affecting L4 and L5 nerve roots.  Patient underwent decompressive laminectomy with fusion on 03/17/2019.  Patient had worsening of pain and admitted on 6/29 for workup and pain management.  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  Patient had fall on 7/4 stricking head with occipital laceration.  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  Patient continued to have issues with delirium and pain and follow-up MRI on 7/8 showed severe changes in L area and patient returned to OR for re-exploration and removal of residual spinous process and redo screw fixation.  Great improvement in pain post op.    Patient has long history of severe recurrent depression with recurring ECT at [facility].  Patient reports that she been through many psychotropic trials  in past with varying levels of success.    Patient does acknowledge ongoing anxiety and worry but her mental status is improving and she was oriented with adequate comprehension and understanding of situation.   04/30/19:  Patient with reports of significant reduction in anxiety, which was likely exacerbated by UTI.  Patient's husband was in room and reports his perception is significant reduction in anxiety.  Wife does want consultation with her local psychiatrist.  Patient is likely to be discharged this Sunday, so I will not be able to see her again before discharge.  Disposition/Plan:  Still need to see if we can get input from treating psychiatrist.  Patient not likely to get ECT anytime soon..  Diagnosis:   Anxiety and long history of severe MDD with recurrent ECT at [facility].          Electronically Signed   _______________________ Ilean Skill, Psy.D.

## 2019-04-30 NOTE — Progress Notes (Addendum)
Physical Therapy Weekly Progress Note  Patient Details  Name: Tiffany Gill MRN: [id number] Date of Birth: 1941-01-21  Beginning of progress report period: April 22, 2019 End of progress report period: April 30, 2019  Today's Date: 04/30/2019 PT Individual Time: 1300-1350 PT Individual Time Calculation (min): 50 min   Patient has met 0 of 4 short term goals.  Patient with slow progression towards goals, limited by R hip and back pain and anxiety, including fear of falling, during sessions. She currently requires mod A for bed mobility, however, has been able to perform with min A x1, requires mod-min A for sit<>stand transfers with RW and max A for stand pivot transfers. She is very inconsistent with w/c mobility and gait at this time. She has propelled the w/c up to 30 feet and ambulated up to 15 feet, both with min A, however due to patient's pain with activity and increased anxiety with mobility, progress towards these goals has been very limited.    Patient continues to demonstrate the following deficits muscle weakness and muscle joint tightness, decreased cardiorespiratoy endurance, impaired timing and sequencing, decreased coordination and decreased motor planning, decreased problem solving, decreased safety awareness, decreased memory and delayed processing and decreased sitting balance, decreased standing balance, decreased postural control, decreased balance strategies and difficulty maintaining precautions and therefore will continue to benefit from skilled PT intervention to increase functional independence with mobility.  Patient with slow progression toward long term goals.  See goal revision..  Plan of care revisions: Updated LTGs to reflect wheelchair level at discharge with min A for mobility. Progress limited by R hip and back pain and patient anxiety at this time.   PT Short Term Goals Week 1:  PT Short Term Goal 1 (Week 1): Pt will perform bed mobility with min assist PT Short  Term Goal 1 - Progress (Week 1): Progressing toward goal PT Short Term Goal 2 (Week 1): Pt will perform bed<>chair transfer with  min assist PT Short Term Goal 2 - Progress (Week 1): Progressing toward goal PT Short Term Goal 3 (Week 1): Pt will ambulated x 50 ft with RW and min assist PT Short Term Goal 3 - Progress (Week 1): Not met Week 2:  PT Short Term Goal 1 (Week 2): Patient will perform bed mobility with min A. PT Short Term Goal 2 (Week 2): Patient will perform basic transfers with min A using LRAD. PT Short Term Goal 3 (Week 2): Patient will propel w/c >30 feet. PT Short Term Goal 4 (Week 2): Patient will re-initiate gait training >10 feet using LRAD. Week 3:  PT Short Term Goal 2 (Week 3): STG=LTG due to ELOS.  Skilled Therapeutic Interventions/Progress Updates:     Patient in bed with husband at bedside upon PT arrival. Patient alert and agreeable to PT session. Patient reported 7/10 R hip/buttock pain and RN provided pain medicine and Lidocaine patches to back and R hip/buttock during session. PT provided repositioning, relaxation techniques, rest breaks, and distraction as pain interventions during session.   Therapeutic Activity: Bed Mobility: Patient rolling and performed supine to/from sit with mod A with her husband assiting. Provided verbal cues for log roll technique to maintain spinal precautions and proper body mechanics for patient's husband during assist. Transfers: Patient performed sit to/from stand x1 with mod A with RW, stood for 1 min while RN applied Lidocaine patches with total A for LB dressing for access to pain sites. She also performed stand pivot x2 with mod-max A  from therapist x1 and with mod-max A from patient's husband and CGA from therapist for safety. Provided verbal cues and demonstration for stand pivot transfer for patient's husband to assist with proper body mechanics and safety, provided cues for patient to push up from the bed or w/c then hold onto PT  or husband's B UEs during transfer. Patient reported that she felt very comfortable with her husband assisting today. Patient sat in w/c with a pillow behind her back x2-3 min then cried out in pain and requested to get back to bed. PT attempted to reposition the patient in the w/c for comfort without success and PT assisted patient's husband to assit her back to bed as above.   Patient in bed with her husband at bedside at end of session with breaks locked, bed alarm set, and all needs within reach. Discussed patient's limited progress with therapy and barriers to progress so far, as well and team conference meeting today including setting d/c for Sunday 8/9. Patient and husband agreeable to patient reaching w/c level goals in order to go home and continue to progress with home health therapies and patient's husband stated he has started to look into building a ramp at home and will continue to discuss options with CSW.   Therapy Documentation Precautions:  Precautions Precautions: Back, Fall Precaution Booklet Issued: No Precaution Comments: reviewed back precautions Required Braces or Orthoses: Spinal Brace Spinal Brace: Applied in sitting position, Lumbar corset Restrictions Weight Bearing Restrictions: No   Therapy/Group: Individual Therapy  Ahmaya Ostermiller L Levelle Edelen PT, DPT  04/30/2019, 2:35 PM

## 2019-04-30 NOTE — Progress Notes (Signed)
Occupational Therapy Session Note  Patient Details  Name: Tiffany Gill MRN: [id number] Date of Birth: December 09, 1940  Today's Date: 04/30/2019 OT Individual Time: 1100-1155 OT Individual Time Calculation (min): 55 min    Short Term Goals: Week 2:  OT Short Term Goal 1 (Week 2): STG=LTG secondary to ELOS (LTG downgraded) OT Short Term Goal 1 - Progress (Week 2): Progressing toward goal  Skilled Therapeutic Interventions/Progress Updates:    Pt resting in bed with husband present.  Extensive discussion/education regarding pt's present status, especially as compared to function at admission.  Pt and husband in agreement that pt has made minimal progress and will require considerable assistance at home.  Pt's husband states he will not be able to provide 24 hour assistance but is reaching out to friends and church community. CSW to provide infor about home health agencies that can assist.  Pt insistent on donning pants while supine.  Pt experienced difficulty and agreed to try with sit<>stand from EOB.  Pt agreed that it is easier to don pants with sit<>stand.  Pt practiced squat pivot transfers X 2 with mod A (min A for sit<>stand). Pt declined practicing with husband but agreed to practice with husband during afternoon PT session. Pt remained in bed with bed alarm activated and husband present.   Therapy Documentation Precautions:  Precautions Precautions: Back, Fall Precaution Booklet Issued: No Precaution Comments: reviewed back precautions Required Braces or Orthoses: Spinal Brace Spinal Brace: Applied in sitting position, Lumbar corset Restrictions Weight Bearing Restrictions: No    Pain:  Pt c/o increased pain with activity (R hip/lower back); repositioned and ice applied   Therapy/Group: Individual Therapy  [name] 04/30/2019, 12:39 PM

## 2019-04-30 NOTE — Patient Care Conference (Signed)
Inpatient RehabilitationTeam Conference and Plan of Care Update Date: 04/30/2019   Time: 10:15 AM    Patient Name: Tiffany Gill      Medical Record Number: [id number]  Date of Birth: Feb 08, 1941 Sex: Female         Room/Bed: 4M12C/4M12C-01 Payor Info: Payor: MEDICARE / Plan: MEDICARE PART A AND B / Product Type: *No Product type* /    Admitting Diagnosis: 5. Gen Team  Other Neuro, Lumbar radiculopathy; 13-14days  Admit Date/Time:  04/14/2019  4:44 PM Admission Comments: No comment available   Primary Diagnosis:  Spondylolisthesis of lumbar region Principal Problem: Spondylolisthesis of lumbar region  Patient Active Problem List   Diagnosis Date Noted  . Abnormal urinalysis   . Severe anxiety   . Pain   . Rupture of operation wound   . Sleep disturbance   . Subacute delirium   . CKD (chronic kidney disease), stage III ([place])   . Essential hypertension   . Postoperative pain   . Anxiety state   . Moderate episode of recurrent major depressive disorder ([place])   . Spondylolisthesis of lumbar region 04/14/2019  . Spondylisthesis 04/08/2019  . Back pain   . Anxiety and depression   . Chronic pain syndrome   . Acute blood loss anemia   . Stage 3 chronic kidney disease ([place])   . Spondylolisthesis at L4-L5 level 03/17/2019    Expected Discharge Date: Expected Discharge Date: (TBD - 05-03-19 vs. 05-04-19)  Team Members Present: Physician leading conference: Dr. Delice Lesch Social Worker Present: Alfonse Alpers, LCSW Nurse Present: Judee Clara, LPN PT Present: Apolinar Junes, Renaye Rakers, PT OT Present: Willeen Cass, OT;Roanna Epley, COTA SLP Present: Weston Anna, SLP PPS Coordinator present : Gunnar Fusi, SLP     Current Status/Progress Goal Weekly Team Focus  Medical   Functional deficits and lower extremity weakness  secondary to 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  Improve mobility,  anxiety, pain  See above   Bowel/Bladder   Continent of urine x2 this shift  Continent of bladder/bowel  toilet training continue   Swallow/Nutrition/ Hydration             ADL's   anxiety improved but continues to inhibit progress and consistent active participation in therapy; night bath; max A LB dressing, min A UB dressing; transfers-min A/max A  min A overall  activity tolerance, education, functional transfers, safety awareness   Mobility   min-mod assist for bed mobility, max stand pivot, mod-max A sit<>stand, mod assist RW gait 15 ft limited by pain  min A bed mobility, transfers, gait 25', and mod A 4 steps with rails  diaphragmatic breathing for anxiety, pain management, balance, strength, activity tolerance   Communication             Safety/Cognition/ Behavioral Observations            Pain   Rates Pain on pain scale  7-8/10- Right hip and Right Lower Leg, Qxycodone 10mg  q 3 hrs and Tylenol 325-650mg  prn states pain is 4/10 after Narcotic, Lidoderm patch x 2 inplace Q 12 hrs  Maintain pain at a tolerable level 3/10  QS/PRN assessment   Skin   Posterior Head incision/and Lower back surgical incision healing with no drainage, or signs of infection , both are open to air, ecchymotic areas to arms, hands subsiding  Remain free of Infection  Assess Incisional sites QS/ PRN for signs of skin irritaion,or infections, sites  healing well    Rehab Goals Patient on target to meet rehab goals: No Rehab Goals Revised: goals may have to be downgraded to mod A/mainly w/c level *See Care Plan and progress notes for long and short-term goals.     Barriers to Discharge  Current Status/Progress Possible Resolutions Date Resolved   Physician    Medical stability;Other (comments)     See above  Therapies, optimize mood/pain meds      Nursing                  PT  Inaccessible home environment;Home environment access/layout;Behavior  4 STE multi level home; patient has significant anxiety  and severe R hip/buttock pain limiting progress with mobility  Anxiety improved this week, but limited by sever R hip/buttock pain, patient's husband is open to putting in a ramped enterance if needed, patient can stay on first level of home with bedroom and full bathroom.           OT                  SLP                SW                Discharge Planning/Teaching Needs:  Pt plans to return to her home where her husband will provide the necessary level of care.  Family education is currently ongoing.   Team Discussion:  Pt's anxiety is better, but she now has right leg pain/hip pain.  X-rays of back and hip were normal.  Pt is on Keflex for UTI vs. back wound.  Potassium supplement given and labs ordered for today.  Pt is more alert and oriented x 4, skin is good, and nurse is trying to manage pain for pt.  Pt is participating more and anxiety is okay the first half of the session and then it gets worse.  She is not close to min A goals and goals may need to be downgraded to mod A at mainly w/c level at home.  Husband looking into ramp after PT talked with him about .  Pt is max A for txs, min A bed mobility, walked 10'-15'.   Revisions to Treatment Plan:  none    Continued Need for Acute Rehabilitation Level of Care: The patient requires daily medical management by a physician with specialized training in physical medicine and rehabilitation for the following conditions: Daily direction of a multidisciplinary physical rehabilitation program to ensure safe treatment while eliciting the highest outcome that is of practical value to the patient.: Yes Daily medical management of patient stability for increased activity during participation in an intensive rehabilitation regime.: Yes Daily analysis of laboratory values and/or radiology reports with any subsequent need for medication adjustment of medical intervention for : Neurological problems;Post surgical problems;Mood/behavior problems   I  attest that I was present, lead the team conference, and concur with the assessment and plan of the team.Team conference was held via web/ teleconference due to [place] - 19.   Kedar Sedano, Silvestre Mesi 04/30/2019, 10:50 AM

## 2019-04-30 NOTE — Plan of Care (Signed)
  Problem: RH Ambulation Goal: LTG Patient will ambulate in controlled environment (PT) Description: LTG: Patient will ambulate in a controlled environment, # of feet with assistance (PT). Outcome: Not Progressing Flowsheets (Taken 04/30/2019 1504) LTG: Pt will ambulate in controlled environ  assist needed:: (Discontinue goal) -- Note: Discontinue goal due to lack of progress limited by pain and anxiety with mobility.  Goal: LTG Patient will ambulate in home environment (PT) Description: LTG: Patient will ambulate in home environment, # of feet with assistance (PT). Outcome: Not Progressing Flowsheets (Taken 04/30/2019 1504) LTG: Pt will ambulate in home environ  assist needed:: (Discontinue goal.) -- Note: Discontinue goal due to lack of progress limited by pain and anxiety with mobility.    Problem: RH Stairs Goal: LTG Patient will ambulate up and down stairs w/assist (PT) Description: LTG: Patient will ambulate up and down # of stairs with assistance (PT) Outcome: Not Progressing Flowsheets (Taken 04/30/2019 1504) LTG: Pt will ambulate up/down stairs assist needed:: (Discontinue goal) -- Note: Discontinue goal due to lack of progress limited by pain and anxiety with mobility, patient's husband is having a ramp built.

## 2019-05-01 ENCOUNTER — Inpatient Hospital Stay (HOSPITAL_COMMUNITY): Payer: Medicare Other

## 2019-05-01 DIAGNOSIS — R0989 Other specified symptoms and signs involving the circulatory and respiratory systems: Secondary | ICD-10-CM

## 2019-05-01 MED ORDER — PREDNISONE 10 MG PO TABS: 10.0000 mg | ORAL_TABLET | Freq: Every day | ORAL | Status: DC

## 2019-05-01 MED ORDER — PREDNISONE 10 MG PO TABS
15.0000 mg | ORAL_TABLET | Freq: Once | ORAL | Status: AC
  Administered 2019-05-01: 15 mg via ORAL
  Filled 2019-05-01: qty 2

## 2019-05-01 MED ORDER — PREDNISONE 20 MG PO TABS
20.0000 mg | ORAL_TABLET | Freq: Every day | ORAL | Status: DC
  Administered 2019-05-02: 09:00:00 20 mg via ORAL
  Filled 2019-05-01: qty 1

## 2019-05-01 NOTE — Progress Notes (Signed)
Staples out (8) ; patient tolerated; site healed.

## 2019-05-01 NOTE — Progress Notes (Signed)
Social Work Discharge Note   The overall goal for the admission was met for:   Discharge location: Yes - home  Length of Stay: Yes - 19 days  Discharge activity level: No - mod A level  Home/community participation: Yes  Services provided included: MD, RD, PT, OT, SLP, RN, Pharmacy, Neuropsych and SW  Financial Services: Medicare and Private Insurance:  [employer] supplement  Follow-up services arranged: Home Health: PT/OT, DME: 16"x16" lightweight w/c with basic seat and back cushions; youth rolling walker and Patient/Family request agency HH: [facility], DME: [employer]  Comments (or additional information): Pt's husband has received family education and feels prepared to care for pt, although he's a little nervous of how it will go.  Team feels pt may be less anxious at home and is more comfortable with her husband assisting her than therapists at times.  Patient/Family verbalized understanding of follow-up arrangements: Yes  Individual responsible for coordination of the follow-up plan: pt's husband, Blyss Lugar [phone] (h); [phone] (m)  Confirmed correct DME delivered: Trey Sailors 05/01/2019    Azilee Pirro, Silvestre Mesi

## 2019-05-01 NOTE — Progress Notes (Signed)
[facility] PHYSICAL MEDICINE & REHABILITATION PROGRESS NOTE  Subjective/Complaints: Patient seen laying in bed this morning.  She states she slept well overnight.  She denies pain.  ROS: Denies CP SOB, N/V/D  Objective:   No results found. No results for input(s): WBC, HGB, HCT, PLT in the last 72 hours. Recent Labs    04/30/19 0648  NA 140  K 4.8  CL 108  CO2 23  GLUCOSE 128*  BUN 36*  CREATININE 1.13*  CALCIUM 9.7    Intake/Output Summary (Last 24 hours) at 05/01/2019 1504 Last data filed at 05/01/2019 0840 Gross per 24 hour  Intake 118 ml  Output -  Net 118 ml     Physical Exam: Vital Signs Blood pressure 139/66, pulse 96, temperature 97.6 F (36.4 C), temperature source Oral, resp. rate 16, height 5' (1.524 m), weight 56.2 kg, SpO2 97 %. Constitutional: No distress . Vital signs reviewed. HENT: Normocephalic.  Atraumatic. Eyes: EOMI. No discharge. Cardiovascular: No JVD. Respiratory: Normal effort.  No stridor. GI: Non-distended. Skin: Warm and dry.  Intact. Psych: Normal mood.  Normal behavior. Musc: No pain with SLR or internal/external hip rotation-patient only complains of pain in foot at site of pressure  Neurologic: Alert Motor: 4-4+/5 grossly throughout, when patient provides full participation, some limited in LE due to participation Skin: Distal dehiscence of incision, not examined today Psych: Calm this AM  Assessment/Plan: 1. Functional deficits secondary to Lumbar radiculopathy with LLE weakness which require 3+ hours per day of interdisciplinary therapy in a comprehensive inpatient rehab setting.  Physiatrist is providing close team supervision and 24 hour management of active medical problems listed below.  Physiatrist and rehab team continue to assess barriers to discharge/monitor patient progress toward functional and medical goals  Care Tool:  Bathing    Body parts bathed by patient: Right arm, Left arm, Chest, Abdomen, Right upper leg,  Left upper leg   Body parts bathed by helper: Front perineal area, Buttocks, Right lower leg, Left lower leg     Bathing assist Assist Level: Maximal Assistance - Patient 24 - 49%     Upper Body Dressing/Undressing Upper body dressing   What is the patient wearing?: Pull over shirt    Upper body assist Assist Level: Supervision/Verbal cueing    Lower Body Dressing/Undressing Lower body dressing      What is the patient wearing?: Pants     Lower body assist Assist for lower body dressing: Maximal Assistance - Patient 25 - 49%     Toileting Toileting    Toileting assist Assist for toileting: Maximal Assistance - Patient 25 - 49%     Transfers Chair/bed transfer  Transfers assist     Chair/bed transfer assist level: Maximal Assistance - Patient 25 - 49%     Locomotion Ambulation   Ambulation assist      Assist level: Moderate Assistance - Patient 50 - 74% Assistive device: Walker-rolling Max distance: 2'   Walk 10 feet activity   Assist     Assist level: Moderate Assistance - Patient - 50 - 74% Assistive device: Walker-rolling   Walk 50 feet activity   Assist Walk 50 feet with 2 turns activity did not occur: Safety/medical concerns(pain)         Walk 150 feet activity   Assist Walk 150 feet activity did not occur: Safety/medical concerns         Walk 10 feet on uneven surface  activity   Assist Walk 10 feet on uneven surfaces  activity did not occur: Safety/medical concerns         Wheelchair     Assist Will patient use wheelchair at discharge?: Yes Type of Wheelchair: Manual    Wheelchair assist level: Supervision/Verbal cueing Max wheelchair distance: 30    Wheelchair 50 feet with 2 turns activity    Assist        Assist Level: Minimal Assistance - Patient > 75%   Wheelchair 150 feet activity     Assist Wheelchair 150 feet activity did not occur: Safety/medical concerns          Medical  Problem List and Plan: 1.Functional deficits and lower extremity weaknesssecondary to 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  Continue CIR  Back x-ray personally reviewed, stable.  Hip xray personally reviewed, unremarkable  Informed by the social worker that neurosurgery evaluated patient and recommended steroids, also discussed with husband, however no neurosurgery note  Will increase prednisone to 20 mg x 4 days, followed by discharge on 10 mg 2. Antithrombotics: -DVT/anticoagulation:Pharmaceutical:Heparin -antiplatelet therapy: N/A 3. Pain Management: Oxycodone PRN  Continue lidocaine patch to back, ordered for right hip as well with benefit.  Significantly confounded by anxiety, persists at times, but overall improved 4. Mood:Team to provide ego support. LCSW to follow for evaluation and support when appropriate. -antipsychotic agents: Continue Abilify.  Discussed anxiety with neuropsychology-we will likely continue to have anxiety while inpatient.  5. Neuropsych: This patientis not fully capable of making decisions onherown behalf.  Continue tele-sitter for safety  Discussed anxiety with Neuropsych 6. Skin/Wound Care:Routine pressure relief measures.  Evaluated by neurosurgery- suggesting superficial dehiscence  Wound culture ordered, remains pending and antibiotics already started  Continue empiric Keflex given change in mental status, equivocal UA, and wound drainage (started on 7/31) 7. Fluids/Electrolytes/Nutrition:Monitor I/O.  8. HTN: Monitor BP tid--continue Cozaar and Norvasc.  Vitals:   05/01/19 0319 05/01/19 1450  BP: (!) 158/83 139/66  Pulse: 96 96  Resp:    Temp:  97.6 F (36.4 C)  SpO2:  97%   Labile on 8/6 9.  Likely CKD:SCr was around 1.0 prior to admission.  Creatinine 1.13 on 8/5, labs ordered for tomorrow  IVF x1  night ordered  Encourage fluids  Continue to monitor 10. Delirium: Keep lights on during the day --monitor sleep wake cycle and set schedule. Limit cognitively altering medications as much as possible 11. MDD with severe anxiety:Was undergoing ECT at [facility] every 3 months (prior to start of pandemic) Continue Abilify, Cymbaltaand Wellbutrin  Klonopin changed to scheduled 0.25 with breakfast and lunch  Discussed use of aroma therapy with nursing  See #4  Team to provide support with reassurance  12. ABLA: Monitor for signs of bleeding.   Hemoglobin 10.5 on 8/3  Hemoccult negative again  Continue to monitor 13. Leucocytosis: Resolved 14. Abnormal UA  Equivocal UA  Ucx with multiple species 15. Sleep disturbance  Melatonin started on 7/30, increased on 7/31  Improving overall  Consider Ambien if necessary 16. Hypokalemia  Potassium 4.8 on 8/5  Supplement d/ced  LOS: 17 days A FACE TO [place] 05/01/2019, 3:04 PM

## 2019-05-01 NOTE — Progress Notes (Signed)
Occupational Therapy Session Note  Patient Details  Name: Tiffany Gill MRN: [id number] Date of Birth: [birth year]  Today's Date: 05/01/2019 OT Individual Time: 1100-1155 OT Individual Time Calculation (min): 55 min    Short Term Goals: Week 3:  OT Short Term Goal 1 (Week 3): STG=LTG secondary to ELOS (LTG downgraded)  Skilled Therapeutic Interventions/Progress Updates:    Pt resting in bed upon arrival with husband present.  OT intervention with focus on continued family education.  Pt's husband assisted pt to EOB and performed stand pivot tranfser to w/c.  Husband encouraged pt to wash face, brush teeth, apply lip gloss while seated in w/c at sink.  Discussed importance of having schedule at home with daily/weekly goals for time OOB. Pt and husband verbalized understanding. Pt became anxious stating that sitting up in w/c made her back pain worse.  Pt's husband assisted pt back to bed and repositioned.  Pt remained in bed with all needs within reach, bed alarm activated, and husband present. RN notified that head staples had not been removed yet.  Husband inquired about steriods for pt's back/R hip.  RN notified.  Therapy Documentation Precautions:  Precautions Precautions: Back, Fall Precaution Booklet Issued: No Precaution Comments: reviewed back precautions Required Braces or Orthoses: Spinal Brace Spinal Brace: Applied in sitting position, Lumbar corset Restrictions Weight Bearing Restrictions: No  Pain:  Pt c/o 10/10 pain in back at end of session (in addition to increased anxiety); repositioned and transferred to bed   Therapy/Group: Individual Therapy  [name] 05/01/2019, 1:32 PM

## 2019-05-01 NOTE — Progress Notes (Signed)
Patient's husband concern about meds for wife re: inflammation on her back. Pam PA made aware. Per husband he called Dr. Gari Crown office as well.

## 2019-05-01 NOTE — Progress Notes (Signed)
Physical Therapy Session Note  Patient Details  Name: Tiffany Gill MRN: [id number] Date of Birth: 01-14-1941  Today's Date: 05/01/2019 PT Individual Time: 1305-1405 PT Individual Time Calculation (min): 60 min   Short Term Goals: Week 3:  PT Short Term Goal 2 (Week 3): STG=LTG due to ELOS.  Skilled Therapeutic Interventions/Progress Updates:     Patient in bed with her husband, Simona Huh, at bedside upon PT arrival. Patient alert and agreeable to PT session. She reported 4/10 R hip/buttock pain at beginning of session, per RN patient was pre-medicated prior to session. PT provided rest breaks, repositioning, and distraction as pain interventions throughout session.   Therapeutic Activity: Bed Mobility: Patient performed supine to/from sit with min-mod A from her husband. Provided verbal cues for proper set up and body mechanics. Patient's husband was able to cue patient with log roll technique to maintain spinal precautions independently.  Transfers: Patient performed stand pivot transfers x2 with her husband and a car transfer with PT assisting getting into the car and her husband assisting getting out of the car with max A x1 and mod A for all other transfers. Provided verbal cues for scooting forward and having her feet under her prior to transfers, proper body mechanics for patient's husband, and having the patient's husband wait for the patient to initiate standing prior to lift/assisting the patient to stand.  Wheelchair Mobility:  Patient propelled wheelchair 30 feet with supervision. Provided verbal cues for increased stroke length during propulsion and turning technique.   Discussed d/c plan with patient and her husband. Her husband plans to start to build a ramp tomorrow and update the team on the status of the ramp for d/c on Sunday tomorrow. He had several questions about equipment needs and concerns about medication. PT provided education on all equipment that has been recommended  by the therapy team and notified RN and Pam, PA about medication concerns. PT educated on fall risk/prevention at home and activation of emergency response in the event of a fall during session.   Patient in bed at end of session with breaks locked, bed alarm set, and all needs within reach. Patient reported 9/10 R hip/buttock pain at end of session. PT repositioned patient with several pillows and notified RN on patient's current pain level.   Therapy Documentation Precautions:  Precautions Precautions: Back, Fall Precaution Booklet Issued: No Precaution Comments: reviewed back precautions Required Braces or Orthoses: Spinal Brace Spinal Brace: Applied in sitting position, Lumbar corset Restrictions Weight Bearing Restrictions: No    Therapy/Group: Individual Therapy  Lyndsee Casa L Memorie Yokoyama PT, DPT  05/01/2019, 5:21 PM

## 2019-05-01 NOTE — Progress Notes (Signed)
As this chaplain was walking by room, patient called out. When approached, pt asked if "staff had seen her husband, Tiffany Gill." Chaplain indicated she would ask. Subsequently, told pt that no one had seen her husband, but would be on the lookout. Pt reported that she was waiting for him because she couldn't get out of bed and "didn't know what to do". Chaplain acknowledged patient's frustration; she seemed to calm down somewhat when told staff were alerted to her concerns. Pt would benefit from spiritual support. Will refer to floor chaplain.   Please contact as needed for further support.  Minus Liberty, [place]

## 2019-05-01 NOTE — Progress Notes (Signed)
Social Work Patient ID: Tiffany Gill, female   DOB: 06-09-1941, 78 y.o.   MRN: [id number]   CSW met with pt and her husband to update them on team conference discussion and targeted d/c date of 05-04-19.  Pt's husband's questions were answered and he is undergoing hands on family education.  He is also trying to figure out how to have a ramp in place for pt's return home - resources given.  Discussed HH and DME.  CSW remains available to assist further, if needed.

## 2019-05-01 NOTE — Progress Notes (Deleted)
As this chaplain was walking by room, patient called out. When approached, pt asked if "staff had seen her husband, Simona Huh."  Chaplain indicated she would ask.  Subsequently, told pt that no one had seen her husband, but would be on the lookout.  Pt reported that she was waiting for him because she couldn't get out of bed and "didn't know what to do".  Chaplain acknowledged patient's frustration; she seemed to calm down somewhat when told staff were alerted to her concerns.  Pt would benefit from spiritual support. Will refer to floor chaplain.    Please contact as needed for further support.   Minus Liberty, [place]

## 2019-05-01 NOTE — Progress Notes (Signed)
  Patient ID: Tiffany Gill, female   DOB: [birth year], 78 y.o.   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  A walker or cane will not resolve issue with performing activities of daily living.  A wheelchair will allow patient to safely perform daily activities.  Patient is not able to propel herself in the home using a standard weight wheelchair due to general weakness and endurance.  Patient can self propel in the lightweight wheelchair.

## 2019-05-02 ENCOUNTER — Inpatient Hospital Stay (HOSPITAL_COMMUNITY): Payer: Medicare Other

## 2019-05-02 LAB — BASIC METABOLIC PANEL
Anion gap: 9 (ref 5–15)
BUN: 19 mg/dL (ref 8–23)
CO2: 23 mmol/L (ref 22–32)
Calcium: 9.5 mg/dL (ref 8.9–10.3)
Chloride: 103 mmol/L (ref 98–111)
Creatinine, Ser: 0.87 mg/dL (ref 0.44–1.00)
GFR calc Af Amer: >60 mL/min (ref >60)
GFR calc non Af Amer: >60 mL/min (ref >60)
Glucose, Bld: 108 mg/dL — ABNORMAL HIGH (ref 70–99)
Potassium: 4.2 mmol/L (ref 3.5–5.1)
Sodium: 135 mmol/L (ref 135–145)

## 2019-05-02 LAB — GLUCOSE, CAPILLARY: Glucose-Capillary: 82 mg/dL (ref 70–99)

## 2019-05-02 MED ORDER — AMLODIPINE BESYLATE 2.5 MG PO TABS: 5.0000 mg | ORAL_TABLET | Freq: Every day | ORAL | 0 refills | Status: AC

## 2019-05-02 MED ORDER — OXYCODONE HCL 10 MG PO TABS: 10.0000 mg | ORAL_TABLET | Freq: Four times a day (QID) | ORAL | 0 refills | Status: DC | PRN

## 2019-05-02 MED ORDER — PREDNISONE 5 MG PO TABS
7.5000 mg | ORAL_TABLET | Freq: Every day | ORAL | Status: DC
  Administered 2019-05-03 – 2019-05-04 (×2): 7.5 mg via ORAL
  Filled 2019-05-02 (×2): qty 1

## 2019-05-02 MED ORDER — POLYSACCHARIDE IRON COMPLEX 150 MG PO CAPS: 150.0000 mg | ORAL_CAPSULE | Freq: Two times a day (BID) | ORAL | 0 refills | Status: AC

## 2019-05-02 MED ORDER — PANTOPRAZOLE SODIUM 40 MG PO TBEC: 40.0000 mg | DELAYED_RELEASE_TABLET | Freq: Every day | ORAL | 0 refills | Status: AC

## 2019-05-02 MED ORDER — ACETAMINOPHEN 325 MG PO TABS: 325.0000 mg | ORAL_TABLET | ORAL | Status: AC | PRN

## 2019-05-02 MED ORDER — PREDNISONE 5 MG PO TABS: 7.5000 mg | ORAL_TABLET | Freq: Every day | ORAL | 0 refills | Status: AC

## 2019-05-02 MED ORDER — LIDOCAINE 5 % EX PTCH: MEDICATED_PATCH | CUTANEOUS | 0 refills | Status: AC

## 2019-05-02 MED ORDER — MELATONIN 3 MG PO TABS: 3.0000 mg | ORAL_TABLET | Freq: Every day | ORAL | 0 refills | Status: AC

## 2019-05-02 NOTE — Discharge Instructions (Signed)
Inpatient Rehab Discharge Instructions  Tiffany Gill Discharge date and time: 05/04/19   Activities/Precautions/ Functional Status: Activity: no lifting, driving, or strenuous exercise till cleared by MD Diet: regular diet Wound Care: keep wound clean and dry. Contact Dr. Saintclair Halsted if you develop any problems with your incision/wound--redness, swelling, increase in pain, drainage or if you develop fever or chills.   Functional status:  ___ No restrictions     ___ Walk up steps independently _X__ 24/7 supervision/assistance   ___ Walk up steps with assistance ___ Intermittent supervision/assistance  ___ Bathe/dress independently ___ Walk with walker     __X_ Bathe/dress with assistance ___ Walk Independently    ___ Shower independently _X__ Walk with assistance    ___ Shower with assistance _X__ No alcohol     ___ Return to work/school ________   Special Instructions: 1. Husband to help manage medications.  2. No bending, twisting, arching or lifting items over 5 lbs.   COMMUNITY REFERRALS UPON DISCHARGE:   Home Health:   PT     OT     Agency:  [facility] Phone:  [phone] Medical Equipment/Items Ordered:  16"x16" lightweight wheelchair with basic seat and back cushions; youth rolling walker  Agency/Supplier:  [employer]         Phone:  [phone]     My questions have been answered and I understand these instructions. I will adhere to these goals and the provided educational materials after my discharge from the hospital.  Patient/Caregiver Signature _______________________________ Date __________  Clinician Signature _______________________________________ Date __________  Please bring this form and your medication list with you to all your follow-up doctor's appointments.

## 2019-05-02 NOTE — Discharge Summary (Signed)
Physician Discharge Summary  Patient ID: Tiffany Gill MRN: [id number] DOB/AGE: [birth year] 78 y.o.  Admit date: 04/14/2019 Discharge date: 05/04/2019  Discharge Diagnoses:  Principal Problem:   Spondylolisthesis of lumbar region Active Problems:   Acute blood loss anemia   Anxiety state   Moderate episode of recurrent major depressive disorder (HCC)   Subacute delirium   CKD (chronic kidney disease), stage III (HCC)   Essential hypertension   Postoperative pain   Sleep disturbance   Rupture of operation wound   Severe anxiety   Right leg pain   Discharged Condition:  Stable   Significant Diagnostic Studies: Dg Lumbar Spine Complete  Result Date: 04/26/2019 CLINICAL DATA:  Functional deficits secondary to Lumbar radiculopathy with LLE weakness. Pt having lumbar pain. EXAM: LUMBAR SPINE - COMPLETE 4+ VIEW COMPARISON:  CT 04/07/2019 FINDINGS: Interval revision of posterior fusion hardware L4-5 and removal of interbody cages, with bilateral posterior pedicle screws and vertical interconnecting rods L3-S1. Stable anterolisthesis L4-5. Chronic mild T11 and moderate T12 compression deformities. Extensive aortic calcifications without suggestion of aneurysm. Bilateral pelvic phleboliths. Cholecystectomy clips. IMPRESSION: 1. Postop changes L3-S1 without apparent complication. 2. Old T11 and T12 compression deformities.  No acute fracture. 3.  Aortic Atherosclerosis (ICD10-170.0). Electronically Signed   By: Lucrezia Europe M.D.   On: 04/26/2019 13:32   Dg Sacrum/coccyx  Result Date: 04/02/2019 CLINICAL DATA:  Fall with tailbone pain. EXAM: SACRUM AND COCCYX - 2+ VIEW COMPARISON:  Left hip radiograph 03/28/2019 FINDINGS: Cortical margins of the sacrum and coccyx are intact. No evidence of fracture. Sacroiliac joints are congruent. Postsurgical change at L4-L5, evaluated on lumbar spine MRI earlier today. IMPRESSION: No evidence of sacral or coccygeal fracture. Electronically Signed   By: Keith Rake M.D.   On: 04/02/2019 21:55   Ct Lumbar Spine Wo Contrast  Dg Hip Unilat With Pelvis 2-3 Views Right  Result Date: 04/28/2019 CLINICAL DATA:  Right hip pain. EXAM: DG HIP (WITH OR WITHOUT PELVIS) 2-3V RIGHT COMPARISON:  None. FINDINGS: Both hips are normally located. No acute bony findings or plain film evidence of AVN. The pubic symphysis and SI joints are intact. No pelvic fractures or bone lesions. Lumbar fusion hardware is noted and appears stable. New S1 pedicle screws. IMPRESSION: No acute bony findings or significant degenerative changes. Electronically Signed   By: Marijo Sanes M.D.   On: 04/28/2019 13:12    Labs:  Basic Metabolic Panel: Recent Labs  Lab 04/28/19 0600 04/30/19 0648 05/02/19 0400  NA 138 140 135  K 2.9* 4.8 4.2  CL 106 108 103  CO2 22 23 23   GLUCOSE 87 128* 108*  BUN 28* 36* 19  CREATININE 1.07* 1.13* 0.87  CALCIUM 9.6 9.7 9.5    CBC: CBC Latest Ref Rng & Units 04/28/2019 04/25/2019 04/21/2019  WBC 4.0 - 10.5 K/uL 7.7 8.7 8.3  Hemoglobin 12.0 - 15.0 g/dL 10.5(L) 9.6(L) 9.1(L)  Hematocrit 36.0 - 46.0 % 33.8(L) 30.5(L) 28.5(L)  Platelets 150 - 400 K/uL 338 406(H) 447(H)    CBG: Recent Labs  Lab 05/02/19 0638  GLUCAP 82    Brief HPI:   Tiffany Gill is a 78 year old female with history of HTN, MDD--ongoing ECT, memory loss, LBP radiating to LLE greater than RLE with spondylolisthesis and facet arthropathy with severe spinal stenosis affecting L4 and L5 nerve roots.  She underwent decompressive laminectomy with fusion of L4-L5 on 622/20.  She started developing progressive back and hip pain with follow-up x-rays in office revealing subsistence  of spacer.  She was admitted on 03/24/2019 for work-up and for pain management.  She was started on Decadron however developed severe confusion with hallucination and delirium due to steroid psychosis therefore this was DC'd.  She did sustain a fall and struck her head on 07/04 with subsequent occipital  laceration.  CT of head was negative for acute abnormality.    She continued to have issues with delirium and buttock pain therefore MRI lumbar spine was done on 07/08 showing severe thecal sac effacement L4/L5 retrolisthesis with epidural collection L5 and subdural collection from L2-L4 with displacement.  She continued to have issues with pain and anxiety limiting overall mobility after extensive discussion with husband she was taken to the OR for reexploration of L4-L5 with removal of residual spinous process and redo foraminotomies with screw fixation L3-S1 on 04/08/2019 by Dr. Saintclair Halsted.  Postop has had great improvement in pain.  Mild left foot drop was reported to be stable.  Cognition was improving with decreased in anxiety and family requested intensive rehab program to help with functional deficits.  She was making progress with therapy and she was felt to be a good rehab candidate.     Hospital Course: Tiffany Gill was admitted to rehab 04/14/2019 for inpatient therapies to consist of PT, ST and OT at least three hours five days a week. Past admission physiatrist, therapy team and rehab RN have worked together to provide customized collaborative inpatient rehab.  Her blood pressures have been monitored on twice daily basis and have been stable.  UA was ordered due to leukocytosis and was negative for infection.  She was noted to develop AKI on 08/05 and was treated with IV fluids x12 hours.  Follow-up labs showed resolution of AKI.  She has been afebrile during her stay.  She continued to have high levels of anxiety requiring ego support throughout his stay.  Husband was very resistant about using Klonopin prn despite decrease in dose. Aromatherapy was used to help with anxiety levels.  Neuropsych has been following patient for support and to help with anxiety. Dr. Sima Matas felt that her overall anxiety level had improved.   She has also had a chance to speak with her psychiatrist during the stay and  is to follow-up with him post discharge.   Serial CBC was done to monitor leukocytosis.  Rise in white count felt to be due to steroids and had resolved by discharge.  Acute blood loss anemia is resolving with rise in H&H.   She did develop recurrent hip pain and pelvic x-rays done were negative for acute changes.  Attempted to briefly increase prednisone to 20 mg resulted in recurrent steroid psychosis therefore prednisone was decreased back to 7.5 mg daily and she is to follow-up with neurosurgery for further tapering of medication. Back incision has been monitored daily.  She did have some dehiscence of lower aspect of incision and neurosurgery was contacted for input.  She was started on Keflex empirically for wound prophylaxis.  Dr. Venetia Constable felt that dehiscence was superficial without signs of infection as no drainage or erythema noted.  Local care was recommended with monitoring for any changes.  Her participation and progress has fluctuated during her stay.  She had made limited progress due to refusal to consistently participate in therapy and team felt that she would perform better in her familiar own environment..  Her length of stay was extended per family request to complete education.  She continues to require mod assist overall  and will continue to receive further follow-up home health PT and OT by [facility] after discharge.   Rehab course: During patient's stay in rehab weekly team conferences were held to monitor patient's progress, set goals and discuss barriers to discharge. At admission, patient required mod assist with mobility and ADL tasks. She exhibited moderate impairments in word finding and short-term recall, moderate impairments emergent awareness and functional problem-solving affecting safety with functional family tasks.She was initially making some progress however has been limited by anxiety as well as pain.  She has also been unwilling to consistently participate in  therapy sessions. She is able to complete ADL tasks with min to mod assist.  She is able to perform transfers with mod assist. She is able to ambulate 12 feet with mod to max assist and use of rolling walker and tactile cues for sequencing.   She requires min assist to utilize external aids to help with recall and carry over for basic and familiar tasks. Family education was completed with husband regarding all aspects of safety, care and mobility.  Disposition: Home  Diet: Regular  Special Instructions: 1. Family to assist with all activity. 2.  Monitor wound for any drainage. Contact Dr. Saintclair Halsted for any changes.  3. Continue back precautions.    Discharge Instructions    Ambulatory referral to Physical Medicine Rehab   Complete by: As directed    1-2 weeks transitional care     Allergies as of 05/04/2019      Reactions   Metronidazole Anaphylaxis, Other (See Comments)   Seizure      Medication List    STOP taking these medications   cyclobenzaprine 10 MG tablet Commonly known as: FLEXERIL   HYDROcodone-acetaminophen 10-325 MG tablet Commonly known as: NORCO   HYDROcodone-acetaminophen 5-325 MG tablet Commonly known as: NORCO/VICODIN     TAKE these medications   acetaminophen 325 MG tablet Commonly known as: TYLENOL Take 1-2 tablets (325-650 mg total) by mouth every 4 (four) hours as needed for mild pain.   amLODipine 2.5 MG tablet Commonly known as: NORVASC Take 2 tablets (5 mg total) by mouth daily. What changed: how much to take Notes to patient: Note that dose has been increased   ARIPiprazole 2 MG tablet Commonly known as: ABILIFY Take 2 mg by mouth daily.   buPROPion 300 MG 24 hr tablet Commonly known as: WELLBUTRIN XL Take 300 mg by mouth daily.   clonazePAM 0.5 MG tablet Commonly known as: KLONOPIN Take 0.5 mg by mouth 3 (three) times daily as needed for anxiety. Notes to patient: We had cut dose to 1/2 pill three times a day during her stay. Adjust as  needed   Deplin 15 15-90.314 MG Caps Take 15 mg by mouth daily.   DULoxetine 60 MG capsule Commonly known as: CYMBALTA Take 60 mg by mouth daily.   ibandronate 150 MG tablet Commonly known as: BONIVA Take 150 mg by mouth every 30 (thirty) days.   iron polysaccharides 150 MG capsule Commonly known as: NIFEREX Take 1 capsule (150 mg total) by mouth 2 (two) times daily.   lidocaine 5 % Commonly known as: LIDODERM Apply two patches to back at 7 am and remove at 7 pm daily. Notes to patient: Insurance does not usually cover this. Can purchase patches OTC.   losartan 50 MG tablet Commonly known as: COZAAR Take 50 mg by mouth daily.   Melatonin 3 MG Tabs Take 1 tablet (3 mg total) by mouth at bedtime.  Notes to patient: Can use this for insomnia in place of Ambien. OTC   Oxycodone HCl 10 MG Tabs--Rx# 21 pills Take 1 tablet (10 mg total) by mouth every 6 (six) hours as needed for severe pain. Notes to patient: Need to wean off Oxycodone. Can cut pill in half. Do not use hydrocodone for now.    pantoprazole 40 MG tablet Commonly known as: PROTONIX Take 1 tablet (40 mg total) by mouth at bedtime.   pravastatin 40 MG tablet Commonly known as: PRAVACHOL Take 40 mg by mouth every evening.   predniSONE 5 MG tablet Commonly known as: DELTASONE Take 1.5 tablets (7.5 mg total) by mouth daily with breakfast. What changed:   medication strength  how much to take Notes to patient: Dr. Saintclair Halsted to advise you on steroid wean.    Synthroid 100 MCG tablet Generic drug: levothyroxine Take 100 mcg by mouth daily before breakfast.   Vitamin D3 25 MCG (1000 UT) Caps Take 1,000 Units by mouth daily.   zolpidem 5 MG tablet Commonly known as: AMBIEN Take 5 mg by mouth at bedtime as needed for sleep.      Follow-up Information    Hulan Fess, MD. Go on 05/28/2019.   Specialty: Family Medicine Why: Post hospital follow up on 2:30 PM Contact information: [place] [address] [phone]        Kary Kos, MD Follow up on 05/05/2019.   Specialty: Neurosurgery Why: for post op appointment Contact information: [address] [place] [address] [phone]        Jamse Arn, MD Follow up.   Specialty: Physical Medicine and Rehabilitation Why: Office will call you with follow up appointment Contact information: [address] [place] [address] [phone]           Signed: Bary Leriche 05/08/2019, 11:23 PM

## 2019-05-02 NOTE — Progress Notes (Signed)
[facility] PHYSICAL MEDICINE & REHABILITATION PROGRESS NOTE  Subjective/Complaints: Patient seen laying in bed this morning.  She states she slept well overnight.  She denies pain, but is more confused this morning.  ROS: Denies CP SOB, N/V/D  Objective:   No results found. No results for input(s): WBC, HGB, HCT, PLT in the last 72 hours. Recent Labs    04/30/19 0648 05/02/19 0400  NA 140 135  K 4.8 4.2  CL 108 103  CO2 23 23  GLUCOSE 128* 108*  BUN 36* 19  CREATININE 1.13* 0.87  CALCIUM 9.7 9.5    Intake/Output Summary (Last 24 hours) at 05/02/2019 1156 Last data filed at 05/02/2019 0901 Gross per 24 hour  Intake 248 ml  Output -  Net 248 ml     Physical Exam: Vital Signs Blood pressure (!) 153/81, pulse 92, temperature 98 F (36.7 C), resp. rate 17, height 5' (1.524 m), weight 53.5 kg, SpO2 97 %. Constitutional: NAD.  Vitals signs reviewed. HENT: Normocephalic.  Atraumatic. Eyes: EOMI.  No discharge. Cardiovascular: No JVD. Respiratory: Normal effort.  No stridor. GI: Non-distended. Skin: Warm and dry.  Intact. Psych: Calm this a.m. Musc: No edema or tenderness in extremities Neurologic: Alert Motor: 4-4+/5 grossly throughout Skin: Distal dehiscence of incision, not examined today  Assessment/Plan: 1. Functional deficits secondary to Lumbar radiculopathy with LLE weakness which require 3+ hours per day of interdisciplinary therapy in a comprehensive inpatient rehab setting.  Physiatrist is providing close team supervision and 24 hour management of active medical problems listed below.  Physiatrist and rehab team continue to assess barriers to discharge/monitor patient progress toward functional and medical goals  Care Tool:  Bathing    Body parts bathed by patient: Right arm, Left arm, Chest, Abdomen, Right upper leg, Left upper leg   Body parts bathed by helper: Front perineal area, Buttocks, Right lower leg, Left lower leg     Bathing assist Assist  Level: Maximal Assistance - Patient 24 - 49%     Upper Body Dressing/Undressing Upper body dressing   What is the patient wearing?: Pull over shirt    Upper body assist Assist Level: Supervision/Verbal cueing    Lower Body Dressing/Undressing Lower body dressing      What is the patient wearing?: Pants     Lower body assist Assist for lower body dressing: Maximal Assistance - Patient 25 - 49%     Toileting Toileting    Toileting assist Assist for toileting: Maximal Assistance - Patient 25 - 49%     Transfers Chair/bed transfer  Transfers assist     Chair/bed transfer assist level: Maximal Assistance - Patient 25 - 49%     Locomotion Ambulation   Ambulation assist      Assist level: Moderate Assistance - Patient 50 - 74% Assistive device: Walker-rolling Max distance: 2'   Walk 10 feet activity   Assist     Assist level: Moderate Assistance - Patient - 50 - 74% Assistive device: Walker-rolling   Walk 50 feet activity   Assist Walk 50 feet with 2 turns activity did not occur: Safety/medical concerns(pain)         Walk 150 feet activity   Assist Walk 150 feet activity did not occur: Safety/medical concerns         Walk 10 feet on uneven surface  activity   Assist Walk 10 feet on uneven surfaces activity did not occur: Safety/medical concerns         Wheelchair  Assist Will patient use wheelchair at discharge?: Yes Type of Wheelchair: Manual    Wheelchair assist level: Supervision/Verbal cueing Max wheelchair distance: 67'    Wheelchair 50 feet with 2 turns activity    Assist        Assist Level: Minimal Assistance - Patient > 75%   Wheelchair 150 feet activity     Assist Wheelchair 150 feet activity did not occur: Safety/medical concerns          Medical Problem List and Plan: 1.Functional deficits and lower extremity weaknesssecondary to lumbar stenosis and poly-radiculopathy s/p  decompression and L3-S1 fusion with subsequent re-do, also debilitated.  Continue CIR  Back x-ray personally reviewed, stable.  Hip xray personally reviewed, unremarkable  Informed by the social worker that neurosurgery evaluated patient and recommended steroids, also discussed with husband, however no neurosurgery note  Patient started on prednisone 20 mg, but appears to have caused some steroid-induced psychosis, decrease to 7.5 starting on 8/8 (already received today's dose), 2. Antithrombotics: -DVT/anticoagulation:Pharmaceutical:Heparin -antiplatelet therapy: N/A 3. Pain Management: Oxycodone PRN  Continue lidocaine patch to back, ordered for right hip as well with benefit.  Significantly confounded by anxiety, persists at times, but overall good improvement 4. Mood:Team to provide ego support. LCSW to follow for evaluation and support when appropriate. -antipsychotic agents: Continue Abilify.  Discussed anxiety with neuropsychology-we will likely continue to have anxiety while inpatient.   Overall improvement in anxiety as well 5. Neuropsych: This patientis not fully capable of making decisions onherown behalf.  Continue tele-sitter for safety, can likely DC soon  Discussed anxiety with Neuropsych 6. Skin/Wound Care:Routine pressure relief measures.  Evaluated by neurosurgery- suggesting superficial dehiscence  Wound culture ordered, remains pending and antibiotics already started  Continue empiric Keflex given change in mental status, equivocal UA, and wound drainage (7/31-8/7) 7. Fluids/Electrolytes/Nutrition:Monitor I/O.  8. HTN: Monitor BP tid--continue Cozaar and Norvasc.  Vitals:   05/02/19 0447 05/02/19 0448  BP: 138/83 (!) 153/81  Pulse: 93 92  Resp: 17   Temp: 98 F (36.7 C)   SpO2: 97%    Labile on 8/7 9.  ?CKD:SCr was around 1.0 prior to admission.  Creatinine 0.87 on 8/7  IVF x1 night ordered on  8/6  Encourage fluids  Continue to monitor 10. Delirium: Keep lights on during the day --monitor sleep wake cycle and set schedule. Limit cognitively altering medications as much as possible 11. MDD with severe anxiety:Was undergoing ECT at [facility] every 3 months (prior to start of pandemic) Continue Abilify, Cymbaltaand Wellbutrin  Klonopin changed to scheduled 0.25 with breakfast and lunch  Discussed use of aroma therapy with nursing  See #4  Team to provide support with reassurance  12. ABLA: Monitor for signs of bleeding.   Hemoglobin 10.5 on 8/3  Hemoccult negative again  Continue to monitor 13. Leucocytosis: Resolved 14. Abnormal UA  Equivocal UA  Ucx with multiple species 15. Sleep disturbance  Melatonin started on 7/30, increased on 7/31  Improved  Consider Ambien if necessary 16. Hypokalemia  Potassium 4.2 on 8/7  Supplement d/ced  LOS: 18 days A FACE TO FACE EVALUATION WAS PERFORMED   Lorie Phenix 05/02/2019, 11:56 AM

## 2019-05-02 NOTE — Progress Notes (Signed)
Occupational Therapy Session Note  Patient Details  Name: SARYAH LOPER MRN: [id number] Date of Birth: [birth year]  Today's Date: 05/02/2019 OT Individual Time: 1100-1155 OT Individual Time Calculation (min): 55 min    Short Term Goals: Week 3:  OT Short Term Goal 1 (Week 3): STG=LTG secondary to ELOS (LTG downgraded)  Skilled Therapeutic Interventions/Progress Updates:    Pt resting in bed upon arrival.  Husband not present.  Pt agreeable to donning clothing seated EOB. Pt engaged in rolling R<>L to reposition incontinence brief.  Pt required min A for bed mobility without bed rails. Pt required min A for supine>sit EOB with max verbal cues for sequencing/technique.  Pt required min A to fasten bra before donning shirt.  Pt c/o increased pain with heightened anxiety and requested to return to supine to don pants.  Again, pt required min A for rolling R<>L. Pt's husband entered room in later half of session.  Pt hesitant to sit EOB again.  Continued education with husband.  Pt's husband has a good understanding of assist level and has demonstrated proficiency with transfers. Educated husband on technique for folding w/c. Pt remained in bed with all needs within reach.  Husband present.   Therapy Documentation Precautions:  Precautions Precautions: Back, Fall Precaution Booklet Issued: No Precaution Comments: reviewed back precautions Required Braces or Orthoses: Spinal Brace Spinal Brace: Applied in sitting position, Lumbar corset Restrictions Weight Bearing Restrictions: No  Pain: Pt c/o increased pain in back when sitting EOB and requested to return to bed  Therapy/Group: Individual Therapy  [name] 05/02/2019, 12:16 PM

## 2019-05-02 NOTE — Progress Notes (Addendum)
Physical Therapy Discharge Summary  Patient Details  Name: Tiffany Gill MRN: [id number] Date of Birth: August 24, 1941  Today's Date: 05/04/2019 PT Individual Time: 1330-1400 PT Individual Time Calculation (min): 30 min    Patient has met 1 of 6 long term goals due to improved activity tolerance, improved balance, improved postural control, increased strength, increased range of motion, decreased pain, ability to compensate for deficits, improved awareness and improved coordination.  Patient to discharge at a wheelchair level [place].   Patient's care partner is independent to provide the necessary physical and cognitive assistance at discharge.  Reasons goals not met: Patient with slow progress towards long term goals due to increased anxiety and pain during therapy. Patient's husband performed assistance with all mobility at current level of mod A for all transfers. Patient to d/c home at w/c level with ramped entrance to continue progressing toward improved functional mobility, functional ambulation, and to decrease caregiver burden with HHPT in a more conducive environment at home to manage the patient's anxiety during therapy.    Recommendation:  Patient will benefit from ongoing skilled PT services in home health setting to continue to advance safe functional mobility, address ongoing impairments in balance, strength, ROM, activity tolerance, functional mobility, patient/caregiver education, and minimize fall risk.    Equipment: 16"x16" wheelchair with back support and seat cushion, Youth RW  Reasons for discharge: lack of progress toward goals  Patient/family agrees with progress made and goals achieved: Yes  Skilled therapeutic intervention: Patient in bed with husband at bedside eating lunch upon PT arrival. Requested PT come back following lunch to continue with family education for car transfers for d/c. PT returned 30 min later, patient finished lunch and was agreeable to PT  session. Patient's husband requested to observe PT with all mobility this session, as he has demonstrated proper technique with assisting his wife in previous sessions and would like to save his strength for when they arrive home today.   Therapeutic Activity: Bed Mobility: Patient performed supine to/from sit with min A. Patient's husband provided verbal cues for log roll to the L to maintain spinal precautions. PT donned corset brace with patient sitting EOB. Transfers: Patient performed stand pivot and a car transfer with mod A. Provided verbal cues for w/c set up, scooting forward, body mechanics for assisting, and increased LE use.  Patient in personal vehicle in her husbands care for d/c at end of session. Educated patient and her husband on activating emergency response if transfer from the car to the house or any other transfers feel unsafe to prevent injury to patient and/or caregiver or in the event of a fall.     PT Discharge Precautions/Restrictions Precautions Precautions: Fall;Back Precaution Booklet Issued: No Precaution Comments: history of falls at home and during hospital stay Required Braces or Orthoses: Spinal Brace Spinal Brace: Lumbar corset Restrictions Weight Bearing Restrictions: No Vital Signs Therapy Vitals Temp: 98.4 F (36.9 C) Temp Source: Oral Pulse Rate: 96 Resp: 18 BP: 109/65 Patient Position (if appropriate): Lying Oxygen Therapy SpO2: 98 % O2 Device: Room Air Pain Pain Assessment Pain Scale: 0-10 Pain Score: 0-No pain Vision/Perception  Perception Perception: Within Functional Limits Praxis Praxis: Intact  Cognition Overall Cognitive Status: Impaired/Different from baseline Arousal/Alertness: Awake/alert Orientation Level: Oriented to person Attention: Focused;Sustained Focused Attention: Impaired Focused Attention Impairment: Verbal complex;Functional complex Sustained Attention: Impaired Sustained Attention Impairment: Verbal  complex;Functional complex Memory: Impaired Memory Impairment: Retrieval deficit;Decreased recall of new information;Decreased short term memory Decreased Short Term Memory: Verbal  basic;Functional basic Awareness: Impaired Awareness Impairment: Intellectual impairment Problem Solving: Impaired Problem Solving Impairment: Functional basic;Verbal basic Executive Function: Sequencing;Decision Making;Self Correcting Sequencing: Impaired Sequencing Impairment: Functional basic Decision Making: Impaired Decision Making Impairment: Verbal basic;Functional basic Self Correcting: Impaired Self Correcting Impairment: Functional basic Behaviors: Restless;Other (comment)(increased anxiety from baseline) Safety/Judgment: Impaired Sensation Sensation Light Touch: Appears Intact Hot/Cold: Appears Intact Proprioception: Impaired by gross assessment Stereognosis: Not tested Coordination Gross Motor Movements are Fluid and Coordinated: No Fine Motor Movements are Fluid and Coordinated: No Coordination and Movement Description: generalize weakness and poor motor planning with B LEs Motor  Motor Motor: Other (comment) Motor - Skilled Clinical Observations: generalize weakness and poor motor planning with B LEs  Mobility Bed Mobility Bed Mobility: Rolling Right;Rolling Left;Supine to Sit;Sit to Supine Rolling Right: Minimal Assistance - Patient > 75% Rolling Left: Minimal Assistance - Patient > 75% Supine to Sit: Minimal Assistance - Patient > 75% Sit to Supine: Minimal Assistance - Patient > 75% Transfers Transfers: Sit to Stand;Stand to Sit;Stand Pivot Transfers Sit to Stand: Minimal Assistance - Patient > 75% Stand to Sit: Minimal Assistance - Patient > 75% Stand Pivot Transfers: Minimal Assistance - Patient > 75% Stand Pivot Transfer Details: Manual facilitation for placement;Manual facilitation for weight shifting;Manual facilitation for weight bearing;Tactile cues for initiation;Tactile  cues for placement Transfer (Assistive device): 1 person hand held assist Locomotion  Gait Ambulation: Yes Gait Assistance: Moderate Assistance - Patient 50-74% Gait Distance (Feet): 12 Feet Assistive device: Rolling walker Gait Assistance Details: Tactile cues for sequencing;Verbal cues for gait pattern;Verbal cues for safe use of DME/AE;Verbal cues for sequencing;Tactile cues for weight shifting;Verbal cues for technique;Manual facilitation for weight shifting;Verbal cues for precautions/safety Gait Gait: Yes Gait Pattern: Decreased step length - left;Decreased stance time - right;Decreased stance time - left;Decreased stride length;Decreased step length - right;Decreased weight shift to right;Decreased weight shift to left;Trunk rotated posteriorly on right;Decreased trunk rotation;Narrow base of support;Scissoring Gait velocity: significantly decreased Stairs / Additional Locomotion Stairs: No Wheelchair Mobility Wheelchair Mobility: Yes Wheelchair Assistance: Chartered loss adjuster: Both upper extremities Wheelchair Parts Management: Needs assistance Distance: 30'  Trunk/Postural Assessment  Cervical Assessment Cervical Assessment: Exceptions to WFL(forward head) Thoracic Assessment Thoracic Assessment: Exceptions to WFL(rounded shoulders) Lumbar Assessment Lumbar Assessment: Exceptions to WFL(posterior pelvic tilt) Postural Control Postural Control: Deficits on evaluation(posterior lean, decreased overall with minimal improvement)  Balance Static Sitting Balance Static Sitting - Level of Assistance: 5: Stand by assistance Dynamic Sitting Balance Dynamic Sitting - Level of Assistance: 4: Min assist Static Standing Balance Static Standing - Level of Assistance: 4: Min assist Dynamic Standing Balance Dynamic Standing - Level of Assistance: 3: Mod assist Extremity Assessment  RUE Assessment RUE Assessment: Within Functional Limits LUE  Assessment LUE Assessment: Within Functional Limits RLE Strength Right Hip Flexion: 3+/5 Right Hip Extension: 3+/5 Right Knee Flexion: 3+/5 Right Knee Extension: 4-/5 Right Ankle Dorsiflexion: 3/5 Right Ankle Plantar Flexion: 3/5 LLE Assessment LLE Assessment: Exceptions to [facility] LLE Strength Left Hip Flexion: 3/5 Left Hip Extension: 3/5 Left Knee Flexion: 3/5 Left Knee Extension: 3+/5 Left Ankle Dorsiflexion: 3/5 Left Ankle Plantar Flexion: 3/5     L  PT, DPT  05/02/2019, 5:33 PM

## 2019-05-02 NOTE — Progress Notes (Signed)
Physical Therapy Session Note  Patient Details  Name: Tiffany Gill MRN: [id number] Date of Birth: [birth year]  Today's Date: 05/02/2019 PT Individual Time:1300-1342 and [phone] PT Individual Time Calculation (min): 42 min and 18 min   Short Term Goals: Week 3:  PT Short Term Goal 2 (Week 3): STG=LTG due to ELOS.  Skilled Therapeutic Interventions/Progress Updates:     Session 1: Patient in bed with her husband, Tiffany Gill, at bedside upon PT arrival. Patient alert and agreeable to PT session. She reported 4/10 R hip/buttock pain at beginning of session, RN provided pain medication during session. PT provided rest breaks, repositioning, and distraction as pain interventions throughout session.  Patient's personal w/c and RW arrived today. PT set up equipment during session and informed CSW that w/c back support was missing. Patient's husband reports that the ramp will be built by Sunday for d/c and that he and his son will start building it today.    Therapeutic Activity: Bed Mobility: Patient performed supine to/from sit with min-mod A from her husband. Provided verbal cues for proper set up and body mechanics. Patient's husband was able to cue patient with log roll technique to maintain spinal precautions independently.  Patient's husband donned back corset brace with patient in sitting independently. Transfers: Patient performed stand pivot transfers x2 with her husband with mod A using B HHA. Provided verbal cues for scooting forward and having her feet under her prior to transfers, proper body mechanics for patient's husband. While seated in the w/c the patient became very upset about her increased pain sitting in the w/c and requested to get back to bed. Patient's husband assisted her back to bed as above. Session ended early due to increased R hip/bottock pain. PT to return later in the afternoon when pain medicine reaches therapeutic levels.   Patient in bed with her husband at  bedside at end of session with breaks locked, bed alarm set, and all needs within reach.   Session 2: Patient in bed upon PT arrival. Patient alert and agreeable to PT session. She reported 2/10 R hip/buttock pain at beginning of session, patient was pre-medicated prior to session. PT provided rest breaks, repositioning, and distraction as pain interventions throughout session.   Therapeutic Activity: Bed Mobility: Patient performed supine to/from sit with min-mod A. Provided verbal cues for log roll technique to maintain spinal precautions. Back corset brace donned in sitting. Transfers: Patient performed stand pivot transfers x1 using RW with mod-max A with poor LE motor planning and sequencing using RW. Provided verbal cues for scooting forward and having her feet under her prior to standing, management with facilitation of turning RW and stepping with B LE during transfer. She performed sit to/from stand x1 with mod A using RW with B UEs on RW, as she was unable to follow cues to push up from the w/c. Provided verbal cues for scooting forward and having her feet under her prior to standing, and controlling descent.   Gait Training:  Patient ambulated 12 feet using RW with mod A. Ambulated with variable foot placement, decreased gait speed, decreased initiation of stepping intermittently, narrow BOS with intermittent scissoring gait, and posterior lean. Provided verbal cues for sequencing, initiation of steps, increased step length, increased BOS, and encouragement to continue throughout at patient had increased anxiety during ambulation.  Patient in bed at end of session with breaks locked, bed alarm set, and all needs within reach. Educated patient on d/c plans and POC as well as follow-up with  HHPT.    Therapy Documentation Precautions:  Precautions Precautions: Fall, Back Precaution Booklet Issued: No Precaution Comments: history of falls at home and during hospital stay Required Braces  or Orthoses: Spinal Brace Spinal Brace: Lumbar corset Restrictions Weight Bearing Restrictions: No    Therapy/Group: Individual Therapy  Shavy Beachem L Aurilla Coulibaly PT, DPT  05/02/2019, 5:44 PM

## 2019-05-02 NOTE — Progress Notes (Signed)
Occupational Therapy Discharge Summary  Patient Details  Name: Tiffany Gill MRN: [id number] Date of Birth: [birth year]  Patient has met 1 of 9 long term goals due to improved activity tolerance, improved balance, ability to compensate for deficits and improved awareness.  Pt made minimal and inconsistent progress during this admission.  Pt initially progressed with BADLs and functional transfers but experienced set backs with increased anxiety and unwillingness and inability to actively engaged in self care tasks.  Pt completes bathing at bed level and dressing at bed level/EOB depending on pt's anxiety level. Pt's husband has engaged in hands on education and transfer training.  Husband is proficient in stand pivot transfers. Patient to discharge at overall Min-mod assist level.  Patient's care partner is independent to provide the necessary physical and cognitive assistance at discharge.    Reasons goals not met: Pt's participation and progress towards goals was limited by increased anxiety and pain with increased unwillingness and inability to actively engage in self care tasks.  Pt continues to require mod assist overall for self care tasks and functional transfers.  Recommendation:  Patient will benefit from ongoing skilled OT services in home health setting to continue to advance functional skills in the area of BADL and Reduce care partner burden.  Equipment: No equipment provided  Reasons for discharge: discharge from hospital  Patient/family agrees with progress made and goals achieved: Yes  OT Discharge Vision Baseline Vision/History: No visual deficits Patient Visual Report: No change from baseline Vision Assessment?: No apparent visual deficits Perception  Perception: Within Functional Limits Praxis Praxis: Intact Cognition Overall Cognitive Status: Impaired/Different from baseline Arousal/Alertness: Awake/alert Orientation Level: Oriented to person Attention:  Focused;Sustained Focused Attention: Appears intact Sustained Attention: Appears intact Memory: Appears intact Awareness: Impaired Awareness Impairment: Intellectual impairment Problem Solving: Impaired Safety/Judgment: Impaired Sensation Sensation Light Touch: Appears Intact Hot/Cold: Appears Intact Proprioception: Impaired by gross assessment Stereognosis: Not tested Coordination Gross Motor Movements are Fluid and Coordinated: No Fine Motor Movements are Fluid and Coordinated: No Motor  Motor Motor - Skilled Clinical Observations: generalized weakness    Trunk/Postural Assessment  Cervical Assessment Cervical Assessment: Exceptions to WFL(forward head) Thoracic Assessment Thoracic Assessment: Exceptions to WFL(rounded shoulders) Lumbar Assessment Lumbar Assessment: Exceptions to WFL(posterior pelvic tilt) Postural Control Postural Control: (posterior lean)  Balance Static Sitting Balance Static Sitting - Level of Assistance: 4: Min assist Dynamic Sitting Balance Dynamic Sitting - Level of Assistance: 4: Min assist Extremity/Trunk Assessment RUE Assessment RUE Assessment: Within Functional Limits LUE Assessment LUE Assessment: Within Functional Limits   [name] 05/02/2019, 2:47 PM

## 2019-05-03 ENCOUNTER — Inpatient Hospital Stay (HOSPITAL_COMMUNITY): Payer: Medicare Other | Admitting: Occupational Therapy

## 2019-05-03 ENCOUNTER — Inpatient Hospital Stay (HOSPITAL_COMMUNITY): Payer: Medicare Other | Admitting: Physical Therapy

## 2019-05-03 NOTE — Progress Notes (Signed)
Occupational Therapy Session Note  Patient Details  Name: Tiffany Gill MRN: [id number] Date of Birth: May 16, 1941  Today's Date: 05/03/2019 OT Individual Time: 1100-1154 OT Individual Time Calculation (min): 54 min    Short Term Goals: Week 3:  OT Short Term Goal 1 (Week 3): STG=LTG secondary to ELOS (LTG downgraded)  Skilled Therapeutic Interventions/Progress Updates:    Treatment session with focus on LB dressing at sit > stand level and BSC transfers. Pt's husband not present for therapy session, per pt he is at home "installing the ramp".  Pt agreeable to addressing sit > stand, LB dressing at sit > stand level, and BSC transfers.  Pt completed bed mobility with min assist.  Min cues and assist for donning LSO.  Completed sit > stand from EOB with mod assist.  Stand pivot transfer with RW bed > BSC with max cues for sequencing and stepping pattern.  Pt able to complete sit <> stand from [facility] with min assist with cues for hand placement, however still requiring mod assist for transfer.  Engaged in [place] while seated on BSC with pt requiring assistance when threading pant legs and when pulling pants over hips.  However pt pleased with ability to participate in LB dressing at sit > stand level this session.  Pt continues to be limited by anxiety and pain.  Engaged in discussion regarding pain management prior to therapy sessions to increase activity tolerance and participation.  Pt returned to bed as above and left semi-reclined in bed.    RN notified of pain at beginning of session and provided pain meds during session.  Therapy Documentation Precautions:  Precautions Precautions: Fall, Back Precaution Booklet Issued: No Precaution Comments: history of falls at home and during hospital stay Required Braces or Orthoses: Spinal Brace Spinal Brace: Lumbar corset Restrictions Weight Bearing Restrictions: No Pain: Pain Assessment Pain Scale: 0-10 Pain Score: 9  Pain Type: Acute  pain Pain Location: Hip Pain Orientation: Right Pain Descriptors / Indicators: Aching;Discomfort Pain Frequency: Constant Pain Onset: On-going Pain Intervention(s): Medication (See eMAR)    Therapy/Group: Individual Therapy  Simonne Come 05/03/2019, 1:27 PM

## 2019-05-03 NOTE — Progress Notes (Signed)
Physical Therapy Session Note  Patient Details  Name: Tiffany Gill MRN: [id number] Date of Birth: 1940-10-17  Today's Date: 05/03/2019 PT Individual Time: 1300-1325 AND 1510-1520 PT Individual Time Calculation (min): 25 min AND 10 min  Short Term Goals: Week 3:  PT Short Term Goal 2 (Week 3): STG=LTG due to ELOS.  Skilled Therapeutic Interventions/Progress Updates:   Session 1:  Husband not present for first session. Pt sitting up in bed eating lunch, pain 4/10 in bottom, does not describe further. Pt pleasant and agreeable to therapy. Bed mobility w/ min assist and min assist stand pivot to w/c via HHA bilaterally. Verbal, tactile, and manual cues to reach full upright posture. Worked on w/c mobility in hallway, needed min assist to self-propel x25'. Verbal, tactile, and manual cues for technique and semicircular motion w/ UEs. Pt w/ increasing work of breathing (suspect 2/2 anxiety) and requesting to return to bed. Returned to room total assist. Ended session in supine, all needs in reach.   Session 2:  Husband present later in afternoon and asking to speak to therapist. Husband stated he finished building the ramp this afternoon. Updated husband regarding earlier session and asked him to review transfers. Husband politely declined, stating he would not be able to practice as his back was hurting from building ramp. Discussed tips to protect his back during transfers w/ his wife including verbal and tactile cues to give her to power up herself and importance of anterior weight shifting/unweighting bottom. Husband verbalized understanding and in agreement.   Therapy Documentation Precautions:  Precautions Precautions: Fall, Back Precaution Booklet Issued: No Precaution Comments: history of falls at home and during hospital stay Required Braces or Orthoses: Spinal Brace Spinal Brace: Lumbar corset Restrictions Weight Bearing Restrictions: No Pain: Pain Assessment Pain Scale:  0-10 Pain Score: 4  Pain Type: Acute pain Pain Location: Hip Pain Orientation: Right Pain Descriptors / Indicators: Discomfort Pain Frequency: Constant Pain Onset: On-going Pain Intervention(s): Rest  Therapy/Group: Individual Therapy  Mychelle Kendra K Carlton Sweaney 05/03/2019, 4:08 PM

## 2019-05-03 NOTE — Progress Notes (Signed)
[place] PHYSICAL MEDICINE & REHABILITATION PROGRESS NOTE  Subjective/Complaints: Pt lying in bed. No new complaints. Anxious to get home  ROS: Patient denies fever, rash, sore throat, blurred vision, nausea, vomiting, diarrhea, cough, shortness of breath or chest pain, joint or back pain, headache, or mood change.    Objective:   No results found. No results for input(s): WBC, HGB, HCT, PLT in the last 72 hours. Recent Labs    05/02/19 0400  NA 135  K 4.2  CL 103  CO2 23  GLUCOSE 108*  BUN 19  CREATININE 0.87  CALCIUM 9.5    Intake/Output Summary (Last 24 hours) at 05/03/2019 0911 Last data filed at 05/03/2019 0830 Gross per 24 hour  Intake 60 ml  Output -  Net 60 ml     Physical Exam: Vital Signs Blood pressure 123/77, pulse 90, temperature 97.8 F (36.6 C), temperature source Oral, resp. rate 18, height 5' (1.524 m), weight 53.5 kg, SpO2 99 %. Constitutional: No distress . Vital signs reviewed. HEENT: EOMI, oral membranes moist Neck: supple Cardiovascular: RRR without murmur. No JVD    Respiratory: CTA Bilaterally without wheezes or rales. Normal effort    GI: BS +, non-tender, non-distended  Skin: Warm and dry.  Intact. Psych: Calm this a.m. Musc: No edema or tenderness in extremities Neurologic: Alert Motor: 4-4+/5 grossly throughout Skin: incision/wound not examined  Assessment/Plan: 1. Functional deficits secondary to Lumbar radiculopathy with LLE weakness which require 3+ hours per day of interdisciplinary therapy in a comprehensive inpatient rehab setting.  Physiatrist is providing close team supervision and 24 hour management of active medical problems listed below.  Physiatrist and rehab team continue to assess barriers to discharge/monitor patient progress toward functional and medical goals  Care Tool:  Bathing    Body parts bathed by patient: Right arm, Left arm, Chest, Abdomen, Right upper leg, Left upper leg   Body parts bathed by helper:  Front perineal area, Buttocks, Right lower leg, Left lower leg     Bathing assist Assist Level: Moderate Assistance - Patient 50 - 74%     Upper Body Dressing/Undressing Upper body dressing   What is the patient wearing?: Pull over shirt, Bra    Upper body assist Assist Level: Minimal Assistance - Patient > 75%    Lower Body Dressing/Undressing Lower body dressing      What is the patient wearing?: Pants, Incontinence brief     Lower body assist Assist for lower body dressing: Maximal Assistance - Patient 25 - 49%     Toileting Toileting    Toileting assist Assist for toileting: Maximal Assistance - Patient 25 - 49%     Transfers Chair/bed transfer  Transfers assist     Chair/bed transfer assist level: Moderate Assistance - Patient 50 - 74%     Locomotion Ambulation   Ambulation assist      Assist level: Moderate Assistance - Patient 50 - 74% Assistive device: Walker-rolling Max distance: 12'   Walk 10 feet activity   Assist     Assist level: Moderate Assistance - Patient - 50 - 74% Assistive device: Walker-rolling   Walk 50 feet activity   Assist Walk 50 feet with 2 turns activity did not occur: Safety/medical concerns(pain)         Walk 150 feet activity   Assist Walk 150 feet activity did not occur: Safety/medical concerns         Walk 10 feet on uneven surface  activity   Assist Walk 10  feet on uneven surfaces activity did not occur: Safety/medical concerns         Wheelchair     Assist Will patient use wheelchair at discharge?: Yes Type of Wheelchair: Manual    Wheelchair assist level: Supervision/Verbal cueing Max wheelchair distance: 30'    Wheelchair 50 feet with 2 turns activity    Assist        Assist Level: Minimal Assistance - Patient > 75%   Wheelchair 150 feet activity     Assist Wheelchair 150 feet activity did not occur: Safety/medical concerns          Medical Problem List  and Plan: 1.Functional deficits and lower extremity weaknesssecondary to 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  Continue CIR  Back x-ray personally reviewed, stable.  Hip xray personally reviewed, unremarkable  Prednisone per NS. decreased to 7.5 starting on 8/8 d/t psychosis on 20mg  dose   -wean as outpt?  -dc home tomorrow, family ed completed, ramp being built?  -Patient to see Rehab MD/provider in the office for transitional care encounter in 1-2 weeks.  2. Antithrombotics: -DVT/anticoagulation:Pharmaceutical:Heparin -antiplatelet therapy: N/A 3. Pain Management: Oxycodone PRN  Continue lidocaine patch to back, ordered for right hip as well with benefit.  Significantly confounded by anxiety, persists at times but improving 4. Mood:Team to provide ego support. LCSW to follow for evaluation and support when appropriate. -antipsychotic agents: Continue Abilify.  Discussed anxiety with neuropsychology-we will likely continue to have anxiety while inpatient.   Overall improvement in anxiety as well--should be even better once home 5. Neuropsych: This patientis not fully capable of making decisions onherown behalf.  Continue tele-sitter for safety, ? DC    Discussed anxiety with Neuropsych 6. Skin/Wound Care:Routine pressure relief measures.  Evaluated by neurosurgery- suggesting superficial dehiscence  Wound culture negative  Continue empiric Keflex completed 7. Fluids/Electrolytes/Nutrition:Monitor I/O.  8. HTN: Monitor BP tid--continue Cozaar and Norvasc.  Vitals:   05/03/19 0452 05/03/19 0453  BP:    Pulse:    Resp: 18   Temp:  97.8 F (36.6 C)  SpO2:  99%   Controlled 8/8 9.  ?CKD:SCr was around 1.0 prior to admission.  Creatinine 0.87 on 8/7  IVF x1 night ordered on 8/6  Encourage fluids  Continue to monitor 10. Delirium: Keep  lights on during the day --monitor sleep wake cycle and set schedule. Limit cognitively altering medications as much as possible  -decreased prednisone 11. MDD with severe anxiety:Was undergoing ECT at [facility] every 3 months (prior to start of pandemic) Continue Abilify, Cymbaltaand Wellbutrin  Klonopin changed to scheduled 0.25 with breakfast and lunch  Discussed use of aroma therapy with nursing  See #4  Team providing support with reassurance  12. ABLA: Monitor for signs of bleeding.   Hemoglobin 10.5 on 8/3  Hemoccult negative again  Continue to monitor 13. Leucocytosis: Resolved 14. Abnormal UA  Equivocal UA  Ucx with multiple species 15. Sleep disturbance  Melatonin started on 7/30, increased on 7/31  Improved  Consider Ambien if necessary 16. Hypokalemia  Potassium 4.2 on 8/7  Supplement d/ced  LOS: 19 days A FACE TO [place] 05/03/2019, 9:11 AM

## 2019-05-04 MED ORDER — HEPARIN SOD (PORK) LOCK FLUSH 100 UNIT/ML IV SOLN
500.0000 [IU] | INTRAVENOUS | Status: AC | PRN
  Administered 2019-05-04: 11:00:00 500 [IU]

## 2019-05-04 NOTE — Progress Notes (Signed)
[place] PHYSICAL MEDICINE & REHABILITATION PROGRESS NOTE  Subjective/Complaints: No new issues. Slept well   ROS: Patient denies fever, rash, sore throat, blurred vision, nausea, vomiting, diarrhea, cough, shortness of breath or chest pain, joint or back pain, headache, or mood change.     Objective:   No results found. No results for input(s): WBC, HGB, HCT, PLT in the last 72 hours. Recent Labs    05/02/19 0400  NA 135  K 4.2  CL 103  CO2 23  GLUCOSE 108*  BUN 19  CREATININE 0.87  CALCIUM 9.5    Intake/Output Summary (Last 24 hours) at 05/04/2019 0918 Last data filed at 05/04/2019 0840 Gross per 24 hour  Intake 195 ml  Output 0 ml  Net 195 ml     Physical Exam: Vital Signs Blood pressure 125/64, pulse 89, temperature 97.6 F (36.4 C), temperature source Oral, resp. rate 17, height 5' (1.524 m), weight 53.5 kg, SpO2 97 %. Constitutional: No distress . Vital signs reviewed. HEENT: EOMI, oral membranes moist Neck: supple Cardiovascular: RRR without murmur. No JVD    Respiratory: CTA Bilaterally without wheezes or rales. Normal effort    GI: BS +, non-tender, non-distended   Skin: Warm and dry.  Intact. Psych: Calm this a.m. Musc: No edema or tenderness in extremities Neurologic: Alert Motor: 4-4+/5 grossly throughout Skin: incision/wound not examined  Assessment/Plan: 1. Functional deficits secondary to Lumbar radiculopathy with LLE weakness which require 3+ hours per day of interdisciplinary therapy in a comprehensive inpatient rehab setting.  Physiatrist is providing close team supervision and 24 hour management of active medical problems listed below.  Physiatrist and rehab team continue to assess barriers to discharge/monitor patient progress toward functional and medical goals  Care Tool:  Bathing    Body parts bathed by patient: Right arm, Left arm, Chest, Abdomen, Right upper leg, Left upper leg   Body parts bathed by helper: Front perineal area,  Buttocks, Right lower leg, Left lower leg     Bathing assist Assist Level: Moderate Assistance - Patient 50 - 74%     Upper Body Dressing/Undressing Upper body dressing   What is the patient wearing?: Pull over shirt, Bra    Upper body assist Assist Level: Minimal Assistance - Patient > 75%    Lower Body Dressing/Undressing Lower body dressing      What is the patient wearing?: Pants, Incontinence brief     Lower body assist Assist for lower body dressing: Maximal Assistance - Patient 25 - 49%     Toileting Toileting    Toileting assist Assist for toileting: Maximal Assistance - Patient 25 - 49%     Transfers Chair/bed transfer  Transfers assist     Chair/bed transfer assist level: Minimal Assistance - Patient > 75%     Locomotion Ambulation   Ambulation assist      Assist level: Moderate Assistance - Patient 50 - 74% Assistive device: Walker-rolling Max distance: 12'   Walk 10 feet activity   Assist     Assist level: Moderate Assistance - Patient - 50 - 74% Assistive device: Walker-rolling   Walk 50 feet activity   Assist Walk 50 feet with 2 turns activity did not occur: Safety/medical concerns         Walk 150 feet activity   Assist Walk 150 feet activity did not occur: Safety/medical concerns         Walk 10 feet on uneven surface  activity   Assist Walk 10 feet on uneven  surfaces activity did not occur: Safety/medical concerns         Wheelchair     Assist Will patient use wheelchair at discharge?: Yes Type of Wheelchair: Manual    Wheelchair assist level: Minimal Assistance - Patient > 75% Max wheelchair distance: 59'    Wheelchair 50 feet with 2 turns activity    Assist    Wheelchair 50 feet with 2 turns activity did not occur: Safety/medical concerns   Assist Level: Minimal Assistance - Patient > 75%   Wheelchair 150 feet activity     Assist Wheelchair 150 feet activity did not occur:  Safety/medical concerns          Medical Problem List and Plan: 1.Functional deficits and lower extremity weaknesssecondary to 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  Continue CIR  Back x-ray personally reviewed, stable.  Hip xray personally reviewed, unremarkable  Prednisone per NS. decreased to 7.5 starting on 8/8 d/t psychosis on 20mg  dose   -wean as outpt---can discuss at [facility] visit  -dc home today  -Patient to see Rehab MD/provider in the office for transitional care encounter in 1-2 weeks.  2. Antithrombotics: -DVT/anticoagulation:Pharmaceutical:Heparin -antiplatelet therapy: N/A 3. Pain Management: Oxycodone PRN  Continue lidocaine patch to back, ordered for right hip as well with benefit.    4. Mood:Team to provide ego support. LCSW to follow for evaluation and support when appropriate. -antipsychotic agents: Continue Abilify.  Discussed anxiety with neuropsychology-we will likely continue to have anxiety while inpatient.   Overall improvement in anxiety as well--should be even better once home 5. Neuropsych: This patientis not fully capable of making decisions onherown behalf.     Discussed anxiety with Neuropsych 6. Skin/Wound Care:Routine pressure relief measures.  Evaluated by neurosurgery- suggesting superficial dehiscence  Wound culture negative  Continue empiric Keflex completed 7. Fluids/Electrolytes/Nutrition:Monitor I/O.  8. HTN: Monitor BP tid--continue Cozaar and Norvasc.  Vitals:   05/03/19 2030 05/04/19 0434  BP: 126/75 125/64  Pulse: 93 89  Resp: 18 17  Temp: 98 F (36.7 C) 97.6 F (36.4 C)  SpO2: 98% 97%   Controlled 8/8 9.  ?CKD:SCr was around 1.0 prior to admission.  Creatinine 0.87 on 8/7  IVF x1 night ordered on 8/6  Encourage fluids  Continue to monitor 10. Delirium: improving  -decreased  prednisone 11. MDD with severe anxiety:Was undergoing ECT at [facility] every 3 months (prior to start of pandemic) Continue Abilify, Cymbaltaand Wellbutrin  Klonopin changed to scheduled 0.25 with breakfast and lunch  -improved. Should be better at home 12. ABLA: Monitor for signs of bleeding.   Hemoglobin 10.5 on 8/3  Hemoccult negative again  Continue to monitor 13. Leucocytosis: Resolved 14. Abnormal UA  Equivocal UA  Ucx with multiple species 15. Sleep disturbance  Melatonin started on 7/30, increased on 7/31  Improved  Consider Ambien if necessary 16. Hypokalemia  Potassium 4.2 on 8/7     LOS: 20 days A FACE TO [place] 05/04/2019, 9:18 AM

## 2019-05-04 NOTE — Progress Notes (Signed)
Patient discharged from unit today. Patient  accompanied by husband. Discharge instructions given to patient and husband by Charge nurse Whitney RN. All belongings sent home with patient. No questions/concerns noted.

## 2019-05-05 DIAGNOSIS — M160 Bilateral primary osteoarthritis of hip: Secondary | ICD-10-CM | POA: Diagnosis not present

## 2019-05-05 DIAGNOSIS — F418 Other specified anxiety disorders: Secondary | ICD-10-CM | POA: Diagnosis not present

## 2019-05-05 DIAGNOSIS — M4716 Other spondylosis with myelopathy, lumbar region: Secondary | ICD-10-CM | POA: Diagnosis not present

## 2019-05-05 DIAGNOSIS — M4316 Spondylolisthesis, lumbar region: Secondary | ICD-10-CM | POA: Diagnosis not present

## 2019-05-05 DIAGNOSIS — D62 Acute posthemorrhagic anemia: Secondary | ICD-10-CM | POA: Diagnosis not present

## 2019-05-05 DIAGNOSIS — Z7952 Long term (current) use of systemic steroids: Secondary | ICD-10-CM | POA: Diagnosis not present

## 2019-05-05 DIAGNOSIS — Z9181 History of falling: Secondary | ICD-10-CM | POA: Diagnosis not present

## 2019-05-05 DIAGNOSIS — R413 Other amnesia: Secondary | ICD-10-CM | POA: Diagnosis not present

## 2019-05-05 DIAGNOSIS — D72829 Elevated white blood cell count, unspecified: Secondary | ICD-10-CM | POA: Diagnosis not present

## 2019-05-05 DIAGNOSIS — E039 Hypothyroidism, unspecified: Secondary | ICD-10-CM | POA: Diagnosis not present

## 2019-05-05 DIAGNOSIS — M5416 Radiculopathy, lumbar region: Secondary | ICD-10-CM | POA: Diagnosis not present

## 2019-05-05 DIAGNOSIS — Z9089 Acquired absence of other organs: Secondary | ICD-10-CM | POA: Diagnosis not present

## 2019-05-05 DIAGNOSIS — E785 Hyperlipidemia, unspecified: Secondary | ICD-10-CM | POA: Diagnosis not present

## 2019-05-05 DIAGNOSIS — I10 Essential (primary) hypertension: Secondary | ICD-10-CM | POA: Diagnosis not present

## 2019-05-05 DIAGNOSIS — H9190 Unspecified hearing loss, unspecified ear: Secondary | ICD-10-CM | POA: Diagnosis not present

## 2019-05-05 DIAGNOSIS — M47817 Spondylosis without myelopathy or radiculopathy, lumbosacral region: Secondary | ICD-10-CM | POA: Diagnosis not present

## 2019-05-05 DIAGNOSIS — E7800 Pure hypercholesterolemia, unspecified: Secondary | ICD-10-CM | POA: Diagnosis not present

## 2019-05-05 DIAGNOSIS — M19071 Primary osteoarthritis, right ankle and foot: Secondary | ICD-10-CM | POA: Diagnosis not present

## 2019-05-05 DIAGNOSIS — M19072 Primary osteoarthritis, left ankle and foot: Secondary | ICD-10-CM | POA: Diagnosis not present

## 2019-05-05 DIAGNOSIS — M48061 Spinal stenosis, lumbar region without neurogenic claudication: Secondary | ICD-10-CM | POA: Diagnosis not present

## 2019-05-05 DIAGNOSIS — M170 Bilateral primary osteoarthritis of knee: Secondary | ICD-10-CM | POA: Diagnosis not present

## 2019-05-05 DIAGNOSIS — Z4789 Encounter for other orthopedic aftercare: Secondary | ICD-10-CM | POA: Diagnosis not present

## 2019-05-05 DIAGNOSIS — Z79891 Long term (current) use of opiate analgesic: Secondary | ICD-10-CM | POA: Diagnosis not present

## 2019-05-05 DIAGNOSIS — Z981 Arthrodesis status: Secondary | ICD-10-CM | POA: Diagnosis not present

## 2019-05-06 DIAGNOSIS — M48061 Spinal stenosis, lumbar region without neurogenic claudication: Secondary | ICD-10-CM | POA: Diagnosis not present

## 2019-05-06 DIAGNOSIS — Z4789 Encounter for other orthopedic aftercare: Secondary | ICD-10-CM | POA: Diagnosis not present

## 2019-05-06 DIAGNOSIS — M47817 Spondylosis without myelopathy or radiculopathy, lumbosacral region: Secondary | ICD-10-CM | POA: Diagnosis not present

## 2019-05-06 DIAGNOSIS — M4316 Spondylolisthesis, lumbar region: Secondary | ICD-10-CM | POA: Diagnosis not present

## 2019-05-06 DIAGNOSIS — M5416 Radiculopathy, lumbar region: Secondary | ICD-10-CM | POA: Diagnosis not present

## 2019-05-06 DIAGNOSIS — D62 Acute posthemorrhagic anemia: Secondary | ICD-10-CM | POA: Diagnosis not present

## 2019-05-08 DIAGNOSIS — D62 Acute posthemorrhagic anemia: Secondary | ICD-10-CM | POA: Diagnosis not present

## 2019-05-08 DIAGNOSIS — M4316 Spondylolisthesis, lumbar region: Secondary | ICD-10-CM | POA: Diagnosis not present

## 2019-05-08 DIAGNOSIS — M48061 Spinal stenosis, lumbar region without neurogenic claudication: Secondary | ICD-10-CM | POA: Diagnosis not present

## 2019-05-08 DIAGNOSIS — Z4789 Encounter for other orthopedic aftercare: Secondary | ICD-10-CM | POA: Diagnosis not present

## 2019-05-08 DIAGNOSIS — M5416 Radiculopathy, lumbar region: Secondary | ICD-10-CM | POA: Diagnosis not present

## 2019-05-08 DIAGNOSIS — M47817 Spondylosis without myelopathy or radiculopathy, lumbosacral region: Secondary | ICD-10-CM | POA: Diagnosis not present

## 2019-05-12 ENCOUNTER — Encounter: Payer: Self-pay | Admitting: Physical Medicine & Rehabilitation

## 2019-05-12 ENCOUNTER — Encounter: Payer: Medicare Other | Attending: Physical Medicine & Rehabilitation | Admitting: Physical Medicine & Rehabilitation

## 2019-05-12 ENCOUNTER — Other Ambulatory Visit: Payer: Self-pay

## 2019-05-12 VITALS — BP 135/83 | HR 109 | Temp 98.0°F | Ht 60.0 in | Wt 125.0 lb

## 2019-05-12 DIAGNOSIS — G8918 Other acute postprocedural pain: Secondary | ICD-10-CM | POA: Insufficient documentation

## 2019-05-12 DIAGNOSIS — N189 Chronic kidney disease, unspecified: Secondary | ICD-10-CM | POA: Diagnosis not present

## 2019-05-12 DIAGNOSIS — M79604 Pain in right leg: Secondary | ICD-10-CM | POA: Diagnosis not present

## 2019-05-12 DIAGNOSIS — M4316 Spondylolisthesis, lumbar region: Secondary | ICD-10-CM | POA: Insufficient documentation

## 2019-05-12 DIAGNOSIS — D500 Iron deficiency anemia secondary to blood loss (chronic): Secondary | ICD-10-CM | POA: Diagnosis not present

## 2019-05-12 DIAGNOSIS — F329 Major depressive disorder, single episode, unspecified: Secondary | ICD-10-CM | POA: Diagnosis not present

## 2019-05-12 DIAGNOSIS — E039 Hypothyroidism, unspecified: Secondary | ICD-10-CM | POA: Diagnosis not present

## 2019-05-12 DIAGNOSIS — M549 Dorsalgia, unspecified: Secondary | ICD-10-CM | POA: Diagnosis not present

## 2019-05-12 DIAGNOSIS — M47816 Spondylosis without myelopathy or radiculopathy, lumbar region: Secondary | ICD-10-CM | POA: Diagnosis not present

## 2019-05-12 DIAGNOSIS — F419 Anxiety disorder, unspecified: Secondary | ICD-10-CM | POA: Diagnosis not present

## 2019-05-12 DIAGNOSIS — M8580 Other specified disorders of bone density and structure, unspecified site: Secondary | ICD-10-CM | POA: Diagnosis not present

## 2019-05-12 DIAGNOSIS — I10 Essential (primary) hypertension: Secondary | ICD-10-CM | POA: Diagnosis not present

## 2019-05-12 DIAGNOSIS — G894 Chronic pain syndrome: Secondary | ICD-10-CM

## 2019-05-12 DIAGNOSIS — F339 Major depressive disorder, recurrent, unspecified: Secondary | ICD-10-CM | POA: Diagnosis not present

## 2019-05-12 DIAGNOSIS — E785 Hyperlipidemia, unspecified: Secondary | ICD-10-CM | POA: Diagnosis not present

## 2019-05-12 NOTE — Progress Notes (Signed)
Subjective:    Patient ID: Tiffany Gill, female    DOB: [birth year], 78 y.o.   MRN: [id number]  HPI Female with history of HTN, MDD--ongoing ECT, memory loss, LBP radiating to LLE greater than RLE with spondylolisthesis and facet arthropathy with severe spinal stenosis affecting L4 and L5 nerve roots presents for transitional care management after receiving CIR for debility.  Admit date: 04/14/2019 Discharge date: 05/04/2019  Husband supplements history. At discharge, she was instructed to follow back precautions, which she has been. She was told to follow up with PCP, but does not have appointment. She sees Librarian, academic. She is taking prednisone.  She complains of back pain today. She is still anxious. She has not heard back from Psychiatry. Sleep has improved.  Therapies: ~2/week Mobility: Wheelchair at all times DME: Bedside commode  Pain Inventory Average Pain 7 Pain Right Now 8 My pain is sharp and aching  In the last 24 hours, has pain interfered with the following? General activity 8 Relation with others 8 Enjoyment of life 8 What TIME of day is your pain at its worst? all Sleep (in general) Fair  Pain is worse with: standing Pain improves with: rest and heat/ice Relief from Meds: 3  Mobility walk without assistance ability to climb steps?  no do you drive?  no use a wheelchair needs help with transfers  Function retired I need assistance with the following:  dressing, bathing, toileting, meal prep, household duties and shopping  Neuro/Psych weakness trouble walking anxiety  Prior Studies Any changes since last visit?  no  Physicians involved in your care Any changes since last visit?  no   Family History  Problem Relation Age of Onset  . Stroke Mother   . Heart disease Father   . Heart disease Brother    Social History   Socioeconomic History  . Marital status: Married    Spouse name: Not on file  . Number of children: 2  . Years of  education: 58  . Highest education level: Bachelor's degree (e.g., BA, AB, BS)  Occupational History  . Not on file  Social Needs  . Financial resource strain: Not hard at all  . Food insecurity    Worry: Never true    Inability: Never true  . Transportation needs    Medical: No    Non-medical: No  Tobacco Use  . Smoking status: Never Smoker  . Smokeless tobacco: Never Used  Substance and Sexual Activity  . Alcohol use: Yes    Alcohol/week: 3.0 standard drinks    Types: 3 Glasses of wine per week  . Drug use: Never  . Sexual activity: Not on file  Lifestyle  . Physical activity    Days per week: 0 days    Minutes per session: 0 min  . Stress: Not at all  Relationships  . Social Herbalist on phone: Patient refused    Gets together: Patient refused    Attends religious service: Patient refused    Active member of club or organization: Patient refused    Attends meetings of clubs or organizations: Patient refused    Relationship status: Patient refused  Other Topics Concern  . Not on file  Social History Narrative  . Not on file   Past Surgical History:  Procedure Laterality Date  . DECOMPRESSIVE LUMBAR LAMINECTOMY LEVEL 4  02/2019  . PARATHYROIDECTOMY     Past Medical History:  Diagnosis Date  . Anxiety   .  Gait disorder   . High cholesterol   . Hypertension   . Hypothyroidism   . Major depression in partial remission ([place])   . Memory loss   . Osteoarthritis    BP 135/83   Pulse (!) 109   Temp 98 F (36.7 C)   Ht 5' (1.524 m)   Wt 125 lb (56.7 kg)   SpO2 97%   BMI 24.41 kg/m   Opioid Risk Score:   Fall Risk Score:  `1  Depression screen PHQ 2/9  No flowsheet data found.   Review of Systems  Constitutional: Negative.   HENT: Negative.   Eyes: Negative.   Respiratory: Negative.   Cardiovascular: Negative.   Gastrointestinal: Positive for constipation.  Endocrine: Negative.   Genitourinary: Negative.   Musculoskeletal: Positive  for arthralgias, back pain, gait problem and myalgias.  Skin: Negative.   Allergic/Immunologic: Negative.   Neurological: Positive for weakness.  Hematological: Negative.   Psychiatric/Behavioral: Negative for sleep disturbance. The patient is nervous/anxious.       Objective:   Physical Exam Constitutional: No distress . Vital signs reviewed. HENT: Normocephalic.  Atraumatic. Eyes: EOMI. No discharge. Cardiovascular: No JVD. Respiratory: Normal effort.  No stridor. GI: Non-distended. Psych: Anxious Musc: No edema.  No tenderness. Neurologic: Alert Motor: 4-/5 grossly throughout (pain inhibition)    Assessment & Plan:  Female with history of HTN, MDD--ongoing ECT, memory loss, LBP radiating to LLE greater than RLE with spondylolisthesis and facet arthropathy with severe spinal stenosis affecting L4 and L5 nerve roots presents for transitional care management after receiving CIR for debility.  1.  Functional deficits and lower extremity weakness  secondary to 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             Continue therapies  Follow up with Neurosurg  2. Pain Management:  Cont meds  Pt to follow up with Neurosurg tomorrow, encouraged followed up to determine if further workup indicated  Will consider Gabapentin 100 qhs  3. Mood:   Cont antipsychotics  Follow up with Psychiatry - needs appointment  4. MDD with severe anxiety:   Was undergoing ECT at [facility] every 3 months  (prior to start of pandemic) Continue Abilify, Cymbalta and Wellbutrin  She is not taking Klonopin - husband does not feel she needs this  See #3  5. Gait abnormality  Cont therapies  Cont wheelchair for safety  Meds reviewed Referrals reviewed All questions answered

## 2019-05-13 DIAGNOSIS — M7061 Trochanteric bursitis, right hip: Secondary | ICD-10-CM | POA: Diagnosis not present

## 2019-05-13 DIAGNOSIS — M47817 Spondylosis without myelopathy or radiculopathy, lumbosacral region: Secondary | ICD-10-CM | POA: Diagnosis not present

## 2019-05-13 DIAGNOSIS — M5416 Radiculopathy, lumbar region: Secondary | ICD-10-CM | POA: Diagnosis not present

## 2019-05-13 DIAGNOSIS — Z4789 Encounter for other orthopedic aftercare: Secondary | ICD-10-CM | POA: Diagnosis not present

## 2019-05-13 DIAGNOSIS — D62 Acute posthemorrhagic anemia: Secondary | ICD-10-CM | POA: Diagnosis not present

## 2019-05-13 DIAGNOSIS — M48061 Spinal stenosis, lumbar region without neurogenic claudication: Secondary | ICD-10-CM | POA: Diagnosis not present

## 2019-05-13 DIAGNOSIS — M4316 Spondylolisthesis, lumbar region: Secondary | ICD-10-CM | POA: Diagnosis not present

## 2019-05-14 DIAGNOSIS — M48061 Spinal stenosis, lumbar region without neurogenic claudication: Secondary | ICD-10-CM | POA: Diagnosis not present

## 2019-05-14 DIAGNOSIS — M47817 Spondylosis without myelopathy or radiculopathy, lumbosacral region: Secondary | ICD-10-CM | POA: Diagnosis not present

## 2019-05-14 DIAGNOSIS — D62 Acute posthemorrhagic anemia: Secondary | ICD-10-CM | POA: Diagnosis not present

## 2019-05-14 DIAGNOSIS — M4316 Spondylolisthesis, lumbar region: Secondary | ICD-10-CM | POA: Diagnosis not present

## 2019-05-14 DIAGNOSIS — Z4789 Encounter for other orthopedic aftercare: Secondary | ICD-10-CM | POA: Diagnosis not present

## 2019-05-14 DIAGNOSIS — M5416 Radiculopathy, lumbar region: Secondary | ICD-10-CM | POA: Diagnosis not present

## 2019-05-15 ENCOUNTER — Other Ambulatory Visit: Payer: Self-pay | Admitting: Neurosurgery

## 2019-05-15 DIAGNOSIS — M47817 Spondylosis without myelopathy or radiculopathy, lumbosacral region: Secondary | ICD-10-CM | POA: Diagnosis not present

## 2019-05-15 DIAGNOSIS — M4316 Spondylolisthesis, lumbar region: Secondary | ICD-10-CM | POA: Diagnosis not present

## 2019-05-15 DIAGNOSIS — M5416 Radiculopathy, lumbar region: Secondary | ICD-10-CM | POA: Diagnosis not present

## 2019-05-15 DIAGNOSIS — M4317 Spondylolisthesis, lumbosacral region: Secondary | ICD-10-CM

## 2019-05-15 DIAGNOSIS — M48061 Spinal stenosis, lumbar region without neurogenic claudication: Secondary | ICD-10-CM | POA: Diagnosis not present

## 2019-05-15 DIAGNOSIS — D62 Acute posthemorrhagic anemia: Secondary | ICD-10-CM | POA: Diagnosis not present

## 2019-05-15 DIAGNOSIS — Z4789 Encounter for other orthopedic aftercare: Secondary | ICD-10-CM | POA: Diagnosis not present

## 2019-05-19 DIAGNOSIS — M48061 Spinal stenosis, lumbar region without neurogenic claudication: Secondary | ICD-10-CM | POA: Diagnosis not present

## 2019-05-19 DIAGNOSIS — Z4789 Encounter for other orthopedic aftercare: Secondary | ICD-10-CM | POA: Diagnosis not present

## 2019-05-19 DIAGNOSIS — M4316 Spondylolisthesis, lumbar region: Secondary | ICD-10-CM | POA: Diagnosis not present

## 2019-05-19 DIAGNOSIS — D62 Acute posthemorrhagic anemia: Secondary | ICD-10-CM | POA: Diagnosis not present

## 2019-05-19 DIAGNOSIS — M5416 Radiculopathy, lumbar region: Secondary | ICD-10-CM | POA: Diagnosis not present

## 2019-05-19 DIAGNOSIS — M47817 Spondylosis without myelopathy or radiculopathy, lumbosacral region: Secondary | ICD-10-CM | POA: Diagnosis not present

## 2019-05-20 DIAGNOSIS — M5416 Radiculopathy, lumbar region: Secondary | ICD-10-CM | POA: Diagnosis not present

## 2019-05-20 DIAGNOSIS — Z4789 Encounter for other orthopedic aftercare: Secondary | ICD-10-CM | POA: Diagnosis not present

## 2019-05-20 DIAGNOSIS — M47817 Spondylosis without myelopathy or radiculopathy, lumbosacral region: Secondary | ICD-10-CM | POA: Diagnosis not present

## 2019-05-20 DIAGNOSIS — D62 Acute posthemorrhagic anemia: Secondary | ICD-10-CM | POA: Diagnosis not present

## 2019-05-20 DIAGNOSIS — M48061 Spinal stenosis, lumbar region without neurogenic claudication: Secondary | ICD-10-CM | POA: Diagnosis not present

## 2019-05-20 DIAGNOSIS — M4316 Spondylolisthesis, lumbar region: Secondary | ICD-10-CM | POA: Diagnosis not present

## 2019-05-22 DIAGNOSIS — M47817 Spondylosis without myelopathy or radiculopathy, lumbosacral region: Secondary | ICD-10-CM | POA: Diagnosis not present

## 2019-05-22 DIAGNOSIS — Z4789 Encounter for other orthopedic aftercare: Secondary | ICD-10-CM | POA: Diagnosis not present

## 2019-05-22 DIAGNOSIS — M5416 Radiculopathy, lumbar region: Secondary | ICD-10-CM | POA: Diagnosis not present

## 2019-05-22 DIAGNOSIS — M48061 Spinal stenosis, lumbar region without neurogenic claudication: Secondary | ICD-10-CM | POA: Diagnosis not present

## 2019-05-22 DIAGNOSIS — D62 Acute posthemorrhagic anemia: Secondary | ICD-10-CM | POA: Diagnosis not present

## 2019-05-22 DIAGNOSIS — M4316 Spondylolisthesis, lumbar region: Secondary | ICD-10-CM | POA: Diagnosis not present

## 2019-05-23 ENCOUNTER — Ambulatory Visit
Admission: RE | Admit: 2019-05-23 | Discharge: 2019-05-23 | Disposition: A | Discharge status: Home / Self Care | Payer: Medicare Other | Source: Ambulatory Visit | Attending: Neurosurgery | Admitting: Neurosurgery

## 2019-05-23 DIAGNOSIS — M545 Low back pain: Secondary | ICD-10-CM | POA: Diagnosis not present

## 2019-05-23 DIAGNOSIS — M4317 Spondylolisthesis, lumbosacral region: Secondary | ICD-10-CM

## 2019-05-26 DIAGNOSIS — M47817 Spondylosis without myelopathy or radiculopathy, lumbosacral region: Secondary | ICD-10-CM | POA: Diagnosis not present

## 2019-05-26 DIAGNOSIS — M48061 Spinal stenosis, lumbar region without neurogenic claudication: Secondary | ICD-10-CM | POA: Diagnosis not present

## 2019-05-26 DIAGNOSIS — Z4789 Encounter for other orthopedic aftercare: Secondary | ICD-10-CM | POA: Diagnosis not present

## 2019-05-26 DIAGNOSIS — M4316 Spondylolisthesis, lumbar region: Secondary | ICD-10-CM | POA: Diagnosis not present

## 2019-05-26 DIAGNOSIS — M5416 Radiculopathy, lumbar region: Secondary | ICD-10-CM | POA: Diagnosis not present

## 2019-05-26 DIAGNOSIS — D62 Acute posthemorrhagic anemia: Secondary | ICD-10-CM | POA: Diagnosis not present

## 2019-05-27 DIAGNOSIS — D62 Acute posthemorrhagic anemia: Secondary | ICD-10-CM | POA: Diagnosis not present

## 2019-05-27 DIAGNOSIS — M48061 Spinal stenosis, lumbar region without neurogenic claudication: Secondary | ICD-10-CM | POA: Diagnosis not present

## 2019-05-27 DIAGNOSIS — M5416 Radiculopathy, lumbar region: Secondary | ICD-10-CM | POA: Diagnosis not present

## 2019-05-27 DIAGNOSIS — Z4789 Encounter for other orthopedic aftercare: Secondary | ICD-10-CM | POA: Diagnosis not present

## 2019-05-27 DIAGNOSIS — M47817 Spondylosis without myelopathy or radiculopathy, lumbosacral region: Secondary | ICD-10-CM | POA: Diagnosis not present

## 2019-05-27 DIAGNOSIS — M4316 Spondylolisthesis, lumbar region: Secondary | ICD-10-CM | POA: Diagnosis not present

## 2019-05-28 DIAGNOSIS — M48061 Spinal stenosis, lumbar region without neurogenic claudication: Secondary | ICD-10-CM | POA: Diagnosis not present

## 2019-05-28 DIAGNOSIS — Z4789 Encounter for other orthopedic aftercare: Secondary | ICD-10-CM | POA: Diagnosis not present

## 2019-05-28 DIAGNOSIS — M47817 Spondylosis without myelopathy or radiculopathy, lumbosacral region: Secondary | ICD-10-CM | POA: Diagnosis not present

## 2019-05-28 DIAGNOSIS — M5416 Radiculopathy, lumbar region: Secondary | ICD-10-CM | POA: Diagnosis not present

## 2019-05-28 DIAGNOSIS — M47816 Spondylosis without myelopathy or radiculopathy, lumbar region: Secondary | ICD-10-CM | POA: Diagnosis not present

## 2019-05-28 DIAGNOSIS — M8580 Other specified disorders of bone density and structure, unspecified site: Secondary | ICD-10-CM | POA: Diagnosis not present

## 2019-05-28 DIAGNOSIS — Z87440 Personal history of urinary (tract) infections: Secondary | ICD-10-CM | POA: Diagnosis not present

## 2019-05-28 DIAGNOSIS — D62 Acute posthemorrhagic anemia: Secondary | ICD-10-CM | POA: Diagnosis not present

## 2019-05-28 DIAGNOSIS — Z23 Encounter for immunization: Secondary | ICD-10-CM | POA: Diagnosis not present

## 2019-05-28 DIAGNOSIS — M4316 Spondylolisthesis, lumbar region: Secondary | ICD-10-CM | POA: Diagnosis not present

## 2019-05-28 DIAGNOSIS — F339 Major depressive disorder, recurrent, unspecified: Secondary | ICD-10-CM | POA: Diagnosis not present

## 2019-05-28 DIAGNOSIS — N189 Chronic kidney disease, unspecified: Secondary | ICD-10-CM | POA: Diagnosis not present

## 2019-05-28 DIAGNOSIS — D500 Iron deficiency anemia secondary to blood loss (chronic): Secondary | ICD-10-CM | POA: Diagnosis not present

## 2019-05-29 DIAGNOSIS — M5416 Radiculopathy, lumbar region: Secondary | ICD-10-CM | POA: Diagnosis not present

## 2019-05-29 DIAGNOSIS — D62 Acute posthemorrhagic anemia: Secondary | ICD-10-CM | POA: Diagnosis not present

## 2019-05-29 DIAGNOSIS — M48061 Spinal stenosis, lumbar region without neurogenic claudication: Secondary | ICD-10-CM | POA: Diagnosis not present

## 2019-05-29 DIAGNOSIS — M47817 Spondylosis without myelopathy or radiculopathy, lumbosacral region: Secondary | ICD-10-CM | POA: Diagnosis not present

## 2019-05-29 DIAGNOSIS — M4316 Spondylolisthesis, lumbar region: Secondary | ICD-10-CM | POA: Diagnosis not present

## 2019-05-29 DIAGNOSIS — F3342 Major depressive disorder, recurrent, in full remission: Secondary | ICD-10-CM | POA: Diagnosis not present

## 2019-05-29 DIAGNOSIS — Z4789 Encounter for other orthopedic aftercare: Secondary | ICD-10-CM | POA: Diagnosis not present

## 2019-05-30 ENCOUNTER — Other Ambulatory Visit: Payer: Self-pay | Admitting: Neurosurgery

## 2019-05-30 DIAGNOSIS — M4316 Spondylolisthesis, lumbar region: Secondary | ICD-10-CM

## 2019-06-02 DIAGNOSIS — M48061 Spinal stenosis, lumbar region without neurogenic claudication: Secondary | ICD-10-CM | POA: Diagnosis not present

## 2019-06-02 DIAGNOSIS — M47817 Spondylosis without myelopathy or radiculopathy, lumbosacral region: Secondary | ICD-10-CM | POA: Diagnosis not present

## 2019-06-02 DIAGNOSIS — Z4789 Encounter for other orthopedic aftercare: Secondary | ICD-10-CM | POA: Diagnosis not present

## 2019-06-02 DIAGNOSIS — M5416 Radiculopathy, lumbar region: Secondary | ICD-10-CM | POA: Diagnosis not present

## 2019-06-02 DIAGNOSIS — M4316 Spondylolisthesis, lumbar region: Secondary | ICD-10-CM | POA: Diagnosis not present

## 2019-06-02 DIAGNOSIS — D62 Acute posthemorrhagic anemia: Secondary | ICD-10-CM | POA: Diagnosis not present

## 2019-06-04 DIAGNOSIS — D62 Acute posthemorrhagic anemia: Secondary | ICD-10-CM | POA: Diagnosis not present

## 2019-06-04 DIAGNOSIS — M47817 Spondylosis without myelopathy or radiculopathy, lumbosacral region: Secondary | ICD-10-CM | POA: Diagnosis not present

## 2019-06-04 DIAGNOSIS — E7800 Pure hypercholesterolemia, unspecified: Secondary | ICD-10-CM | POA: Diagnosis not present

## 2019-06-04 DIAGNOSIS — H9190 Unspecified hearing loss, unspecified ear: Secondary | ICD-10-CM | POA: Diagnosis not present

## 2019-06-04 DIAGNOSIS — M5416 Radiculopathy, lumbar region: Secondary | ICD-10-CM | POA: Diagnosis not present

## 2019-06-04 DIAGNOSIS — M160 Bilateral primary osteoarthritis of hip: Secondary | ICD-10-CM | POA: Diagnosis not present

## 2019-06-04 DIAGNOSIS — I10 Essential (primary) hypertension: Secondary | ICD-10-CM | POA: Diagnosis not present

## 2019-06-04 DIAGNOSIS — Z79891 Long term (current) use of opiate analgesic: Secondary | ICD-10-CM | POA: Diagnosis not present

## 2019-06-04 DIAGNOSIS — M19071 Primary osteoarthritis, right ankle and foot: Secondary | ICD-10-CM | POA: Diagnosis not present

## 2019-06-04 DIAGNOSIS — M4316 Spondylolisthesis, lumbar region: Secondary | ICD-10-CM | POA: Diagnosis not present

## 2019-06-04 DIAGNOSIS — Z9181 History of falling: Secondary | ICD-10-CM | POA: Diagnosis not present

## 2019-06-04 DIAGNOSIS — M19072 Primary osteoarthritis, left ankle and foot: Secondary | ICD-10-CM | POA: Diagnosis not present

## 2019-06-04 DIAGNOSIS — M170 Bilateral primary osteoarthritis of knee: Secondary | ICD-10-CM | POA: Diagnosis not present

## 2019-06-04 DIAGNOSIS — R413 Other amnesia: Secondary | ICD-10-CM | POA: Diagnosis not present

## 2019-06-04 DIAGNOSIS — M48061 Spinal stenosis, lumbar region without neurogenic claudication: Secondary | ICD-10-CM | POA: Diagnosis not present

## 2019-06-04 DIAGNOSIS — E785 Hyperlipidemia, unspecified: Secondary | ICD-10-CM | POA: Diagnosis not present

## 2019-06-04 DIAGNOSIS — Z7952 Long term (current) use of systemic steroids: Secondary | ICD-10-CM | POA: Diagnosis not present

## 2019-06-04 DIAGNOSIS — Z9089 Acquired absence of other organs: Secondary | ICD-10-CM | POA: Diagnosis not present

## 2019-06-04 DIAGNOSIS — D72829 Elevated white blood cell count, unspecified: Secondary | ICD-10-CM | POA: Diagnosis not present

## 2019-06-04 DIAGNOSIS — Z4789 Encounter for other orthopedic aftercare: Secondary | ICD-10-CM | POA: Diagnosis not present

## 2019-06-04 DIAGNOSIS — Z981 Arthrodesis status: Secondary | ICD-10-CM | POA: Diagnosis not present

## 2019-06-04 DIAGNOSIS — E039 Hypothyroidism, unspecified: Secondary | ICD-10-CM | POA: Diagnosis not present

## 2019-06-04 DIAGNOSIS — F418 Other specified anxiety disorders: Secondary | ICD-10-CM | POA: Diagnosis not present

## 2019-06-05 DIAGNOSIS — M4316 Spondylolisthesis, lumbar region: Secondary | ICD-10-CM | POA: Diagnosis not present

## 2019-06-05 DIAGNOSIS — M5416 Radiculopathy, lumbar region: Secondary | ICD-10-CM | POA: Diagnosis not present

## 2019-06-05 DIAGNOSIS — M48061 Spinal stenosis, lumbar region without neurogenic claudication: Secondary | ICD-10-CM | POA: Diagnosis not present

## 2019-06-05 DIAGNOSIS — D62 Acute posthemorrhagic anemia: Secondary | ICD-10-CM | POA: Diagnosis not present

## 2019-06-05 DIAGNOSIS — Z4789 Encounter for other orthopedic aftercare: Secondary | ICD-10-CM | POA: Diagnosis not present

## 2019-06-05 DIAGNOSIS — M47817 Spondylosis without myelopathy or radiculopathy, lumbosacral region: Secondary | ICD-10-CM | POA: Diagnosis not present

## 2019-06-11 ENCOUNTER — Other Ambulatory Visit: Payer: Self-pay

## 2019-06-11 ENCOUNTER — Ambulatory Visit
Admission: RE | Admit: 2019-06-11 | Discharge: 2019-06-11 | Disposition: A | Discharge status: Home / Self Care | Payer: Medicare Other | Source: Ambulatory Visit | Attending: Neurosurgery | Admitting: Neurosurgery

## 2019-06-11 DIAGNOSIS — M4316 Spondylolisthesis, lumbar region: Secondary | ICD-10-CM

## 2019-06-11 DIAGNOSIS — Z780 Asymptomatic menopausal state: Secondary | ICD-10-CM | POA: Diagnosis not present

## 2019-06-11 DIAGNOSIS — M8589 Other specified disorders of bone density and structure, multiple sites: Secondary | ICD-10-CM | POA: Diagnosis not present

## 2019-06-13 DIAGNOSIS — D62 Acute posthemorrhagic anemia: Secondary | ICD-10-CM | POA: Diagnosis not present

## 2019-06-13 DIAGNOSIS — Z4789 Encounter for other orthopedic aftercare: Secondary | ICD-10-CM | POA: Diagnosis not present

## 2019-06-13 DIAGNOSIS — M4316 Spondylolisthesis, lumbar region: Secondary | ICD-10-CM | POA: Diagnosis not present

## 2019-06-13 DIAGNOSIS — M47817 Spondylosis without myelopathy or radiculopathy, lumbosacral region: Secondary | ICD-10-CM | POA: Diagnosis not present

## 2019-06-13 DIAGNOSIS — M48061 Spinal stenosis, lumbar region without neurogenic claudication: Secondary | ICD-10-CM | POA: Diagnosis not present

## 2019-06-13 DIAGNOSIS — M5416 Radiculopathy, lumbar region: Secondary | ICD-10-CM | POA: Diagnosis not present

## 2019-06-17 DIAGNOSIS — M5440 Lumbago with sciatica, unspecified side: Secondary | ICD-10-CM | POA: Diagnosis not present

## 2019-06-19 DIAGNOSIS — M5416 Radiculopathy, lumbar region: Secondary | ICD-10-CM | POA: Diagnosis not present

## 2019-06-19 DIAGNOSIS — Z4789 Encounter for other orthopedic aftercare: Secondary | ICD-10-CM | POA: Diagnosis not present

## 2019-06-19 DIAGNOSIS — D62 Acute posthemorrhagic anemia: Secondary | ICD-10-CM | POA: Diagnosis not present

## 2019-06-19 DIAGNOSIS — M47817 Spondylosis without myelopathy or radiculopathy, lumbosacral region: Secondary | ICD-10-CM | POA: Diagnosis not present

## 2019-06-19 DIAGNOSIS — M4316 Spondylolisthesis, lumbar region: Secondary | ICD-10-CM | POA: Diagnosis not present

## 2019-06-19 DIAGNOSIS — M48061 Spinal stenosis, lumbar region without neurogenic claudication: Secondary | ICD-10-CM | POA: Diagnosis not present

## 2019-06-25 DIAGNOSIS — M47817 Spondylosis without myelopathy or radiculopathy, lumbosacral region: Secondary | ICD-10-CM | POA: Diagnosis not present

## 2019-06-25 DIAGNOSIS — M48061 Spinal stenosis, lumbar region without neurogenic claudication: Secondary | ICD-10-CM | POA: Diagnosis not present

## 2019-06-25 DIAGNOSIS — Z4789 Encounter for other orthopedic aftercare: Secondary | ICD-10-CM | POA: Diagnosis not present

## 2019-06-25 DIAGNOSIS — M4316 Spondylolisthesis, lumbar region: Secondary | ICD-10-CM | POA: Diagnosis not present

## 2019-06-25 DIAGNOSIS — M5416 Radiculopathy, lumbar region: Secondary | ICD-10-CM | POA: Diagnosis not present

## 2019-06-25 DIAGNOSIS — D62 Acute posthemorrhagic anemia: Secondary | ICD-10-CM | POA: Diagnosis not present

## 2019-06-26 DIAGNOSIS — M5416 Radiculopathy, lumbar region: Secondary | ICD-10-CM | POA: Diagnosis not present

## 2019-06-26 DIAGNOSIS — M47817 Spondylosis without myelopathy or radiculopathy, lumbosacral region: Secondary | ICD-10-CM | POA: Diagnosis not present

## 2019-06-26 DIAGNOSIS — M4316 Spondylolisthesis, lumbar region: Secondary | ICD-10-CM | POA: Diagnosis not present

## 2019-06-26 DIAGNOSIS — D62 Acute posthemorrhagic anemia: Secondary | ICD-10-CM | POA: Diagnosis not present

## 2019-06-26 DIAGNOSIS — M48061 Spinal stenosis, lumbar region without neurogenic claudication: Secondary | ICD-10-CM | POA: Diagnosis not present

## 2019-06-26 DIAGNOSIS — Z4789 Encounter for other orthopedic aftercare: Secondary | ICD-10-CM | POA: Diagnosis not present

## 2019-06-30 DIAGNOSIS — E785 Hyperlipidemia, unspecified: Secondary | ICD-10-CM | POA: Diagnosis not present

## 2019-06-30 DIAGNOSIS — I10 Essential (primary) hypertension: Secondary | ICD-10-CM | POA: Diagnosis not present

## 2019-06-30 DIAGNOSIS — M47816 Spondylosis without myelopathy or radiculopathy, lumbar region: Secondary | ICD-10-CM | POA: Diagnosis not present

## 2019-06-30 DIAGNOSIS — E039 Hypothyroidism, unspecified: Secondary | ICD-10-CM | POA: Diagnosis not present

## 2019-06-30 DIAGNOSIS — M8580 Other specified disorders of bone density and structure, unspecified site: Secondary | ICD-10-CM | POA: Diagnosis not present

## 2019-06-30 DIAGNOSIS — D500 Iron deficiency anemia secondary to blood loss (chronic): Secondary | ICD-10-CM | POA: Diagnosis not present

## 2019-06-30 DIAGNOSIS — N189 Chronic kidney disease, unspecified: Secondary | ICD-10-CM | POA: Diagnosis not present

## 2019-06-30 DIAGNOSIS — F339 Major depressive disorder, recurrent, unspecified: Secondary | ICD-10-CM | POA: Diagnosis not present

## 2019-07-01 DIAGNOSIS — Z4789 Encounter for other orthopedic aftercare: Secondary | ICD-10-CM | POA: Diagnosis not present

## 2019-07-01 DIAGNOSIS — M47817 Spondylosis without myelopathy or radiculopathy, lumbosacral region: Secondary | ICD-10-CM | POA: Diagnosis not present

## 2019-07-01 DIAGNOSIS — D62 Acute posthemorrhagic anemia: Secondary | ICD-10-CM | POA: Diagnosis not present

## 2019-07-01 DIAGNOSIS — M4316 Spondylolisthesis, lumbar region: Secondary | ICD-10-CM | POA: Diagnosis not present

## 2019-07-01 DIAGNOSIS — M5416 Radiculopathy, lumbar region: Secondary | ICD-10-CM | POA: Diagnosis not present

## 2019-07-01 DIAGNOSIS — M48061 Spinal stenosis, lumbar region without neurogenic claudication: Secondary | ICD-10-CM | POA: Diagnosis not present

## 2019-07-02 DIAGNOSIS — H2513 Age-related nuclear cataract, bilateral: Secondary | ICD-10-CM | POA: Diagnosis not present

## 2019-07-10 ENCOUNTER — Encounter: Payer: Medicare Other | Admitting: Physical Medicine & Rehabilitation

## 2019-07-14 ENCOUNTER — Ambulatory Visit (INDEPENDENT_AMBULATORY_CARE_PROVIDER_SITE_OTHER): Payer: Medicare Other | Admitting: Psychology

## 2019-07-14 DIAGNOSIS — F332 Major depressive disorder, recurrent severe without psychotic features: Secondary | ICD-10-CM | POA: Diagnosis not present

## 2019-07-22 DIAGNOSIS — M4316 Spondylolisthesis, lumbar region: Secondary | ICD-10-CM | POA: Diagnosis not present

## 2019-08-19 DIAGNOSIS — M4316 Spondylolisthesis, lumbar region: Secondary | ICD-10-CM | POA: Diagnosis not present

## 2019-08-19 DIAGNOSIS — M5440 Lumbago with sciatica, unspecified side: Secondary | ICD-10-CM | POA: Diagnosis not present

## 2019-08-27 DIAGNOSIS — F3342 Major depressive disorder, recurrent, in full remission: Secondary | ICD-10-CM | POA: Diagnosis not present

## 2019-10-21 DIAGNOSIS — M4316 Spondylolisthesis, lumbar region: Secondary | ICD-10-CM | POA: Diagnosis not present

## 2019-10-21 DIAGNOSIS — I10 Essential (primary) hypertension: Secondary | ICD-10-CM | POA: Diagnosis not present

## 2019-10-29 DIAGNOSIS — R269 Unspecified abnormalities of gait and mobility: Secondary | ICD-10-CM | POA: Diagnosis not present

## 2019-10-29 DIAGNOSIS — M4316 Spondylolisthesis, lumbar region: Secondary | ICD-10-CM | POA: Diagnosis not present

## 2019-10-29 DIAGNOSIS — I10 Essential (primary) hypertension: Secondary | ICD-10-CM | POA: Diagnosis not present

## 2019-10-29 DIAGNOSIS — Z9181 History of falling: Secondary | ICD-10-CM | POA: Diagnosis not present

## 2019-10-30 DIAGNOSIS — K5900 Constipation, unspecified: Secondary | ICD-10-CM | POA: Diagnosis not present

## 2019-11-04 DIAGNOSIS — R269 Unspecified abnormalities of gait and mobility: Secondary | ICD-10-CM | POA: Diagnosis not present

## 2019-11-04 DIAGNOSIS — I10 Essential (primary) hypertension: Secondary | ICD-10-CM | POA: Diagnosis not present

## 2019-11-04 DIAGNOSIS — M4316 Spondylolisthesis, lumbar region: Secondary | ICD-10-CM | POA: Diagnosis not present

## 2019-11-04 DIAGNOSIS — Z9181 History of falling: Secondary | ICD-10-CM | POA: Diagnosis not present

## 2019-11-06 DIAGNOSIS — M4316 Spondylolisthesis, lumbar region: Secondary | ICD-10-CM | POA: Diagnosis not present

## 2019-11-06 DIAGNOSIS — R269 Unspecified abnormalities of gait and mobility: Secondary | ICD-10-CM | POA: Diagnosis not present

## 2019-11-06 DIAGNOSIS — Z9181 History of falling: Secondary | ICD-10-CM | POA: Diagnosis not present

## 2019-11-06 DIAGNOSIS — I10 Essential (primary) hypertension: Secondary | ICD-10-CM | POA: Diagnosis not present

## 2019-11-18 DIAGNOSIS — R269 Unspecified abnormalities of gait and mobility: Secondary | ICD-10-CM | POA: Diagnosis not present

## 2019-11-18 DIAGNOSIS — I10 Essential (primary) hypertension: Secondary | ICD-10-CM | POA: Diagnosis not present

## 2019-11-18 DIAGNOSIS — Z9181 History of falling: Secondary | ICD-10-CM | POA: Diagnosis not present

## 2019-11-18 DIAGNOSIS — M4316 Spondylolisthesis, lumbar region: Secondary | ICD-10-CM | POA: Diagnosis not present

## 2019-11-19 DIAGNOSIS — Z9181 History of falling: Secondary | ICD-10-CM | POA: Diagnosis not present

## 2019-11-19 DIAGNOSIS — I10 Essential (primary) hypertension: Secondary | ICD-10-CM | POA: Diagnosis not present

## 2019-11-19 DIAGNOSIS — M4316 Spondylolisthesis, lumbar region: Secondary | ICD-10-CM | POA: Diagnosis not present

## 2019-11-19 DIAGNOSIS — R269 Unspecified abnormalities of gait and mobility: Secondary | ICD-10-CM | POA: Diagnosis not present

## 2019-11-21 DIAGNOSIS — M8580 Other specified disorders of bone density and structure, unspecified site: Secondary | ICD-10-CM | POA: Diagnosis not present

## 2019-11-21 DIAGNOSIS — M47816 Spondylosis without myelopathy or radiculopathy, lumbar region: Secondary | ICD-10-CM | POA: Diagnosis not present

## 2019-11-21 DIAGNOSIS — D500 Iron deficiency anemia secondary to blood loss (chronic): Secondary | ICD-10-CM | POA: Diagnosis not present

## 2019-11-21 DIAGNOSIS — F339 Major depressive disorder, recurrent, unspecified: Secondary | ICD-10-CM | POA: Diagnosis not present

## 2019-11-21 DIAGNOSIS — E785 Hyperlipidemia, unspecified: Secondary | ICD-10-CM | POA: Diagnosis not present

## 2019-11-21 DIAGNOSIS — E039 Hypothyroidism, unspecified: Secondary | ICD-10-CM | POA: Diagnosis not present

## 2019-11-21 DIAGNOSIS — N189 Chronic kidney disease, unspecified: Secondary | ICD-10-CM | POA: Diagnosis not present

## 2019-11-21 DIAGNOSIS — I10 Essential (primary) hypertension: Secondary | ICD-10-CM | POA: Diagnosis not present

## 2019-11-24 DIAGNOSIS — F3342 Major depressive disorder, recurrent, in full remission: Secondary | ICD-10-CM | POA: Diagnosis not present

## 2019-11-25 DIAGNOSIS — S3992XA Unspecified injury of lower back, initial encounter: Secondary | ICD-10-CM | POA: Diagnosis not present

## 2019-11-25 DIAGNOSIS — I700 Atherosclerosis of aorta: Secondary | ICD-10-CM | POA: Diagnosis not present

## 2019-11-25 DIAGNOSIS — I10 Essential (primary) hypertension: Secondary | ICD-10-CM | POA: Diagnosis not present

## 2019-11-25 DIAGNOSIS — S22059A Unspecified fracture of T5-T6 vertebra, initial encounter for closed fracture: Secondary | ICD-10-CM | POA: Diagnosis not present

## 2019-11-25 DIAGNOSIS — R0902 Hypoxemia: Secondary | ICD-10-CM | POA: Diagnosis not present

## 2019-11-25 DIAGNOSIS — I712 Thoracic aortic aneurysm, without rupture: Secondary | ICD-10-CM | POA: Diagnosis not present

## 2019-11-25 DIAGNOSIS — S3991XA Unspecified injury of abdomen, initial encounter: Secondary | ICD-10-CM | POA: Diagnosis not present

## 2019-11-25 DIAGNOSIS — S22050A Wedge compression fracture of T5-T6 vertebra, initial encounter for closed fracture: Secondary | ICD-10-CM | POA: Diagnosis not present

## 2019-11-25 DIAGNOSIS — S299XXA Unspecified injury of thorax, initial encounter: Secondary | ICD-10-CM | POA: Diagnosis not present

## 2019-11-25 DIAGNOSIS — M5489 Other dorsalgia: Secondary | ICD-10-CM | POA: Diagnosis not present

## 2019-11-25 DIAGNOSIS — R52 Pain, unspecified: Secondary | ICD-10-CM | POA: Diagnosis not present

## 2019-11-25 DIAGNOSIS — W01198A Fall on same level from slipping, tripping and stumbling with subsequent striking against other object, initial encounter: Secondary | ICD-10-CM | POA: Diagnosis not present

## 2019-11-25 DIAGNOSIS — Y998 Other external cause status: Secondary | ICD-10-CM | POA: Diagnosis not present

## 2019-11-25 DIAGNOSIS — M542 Cervicalgia: Secondary | ICD-10-CM | POA: Diagnosis not present

## 2019-11-27 DIAGNOSIS — S22058A Other fracture of T5-T6 vertebra, initial encounter for closed fracture: Secondary | ICD-10-CM | POA: Diagnosis not present

## 2019-11-28 DIAGNOSIS — N189 Chronic kidney disease, unspecified: Secondary | ICD-10-CM | POA: Diagnosis not present

## 2019-11-28 DIAGNOSIS — E785 Hyperlipidemia, unspecified: Secondary | ICD-10-CM | POA: Diagnosis not present

## 2019-11-28 DIAGNOSIS — E039 Hypothyroidism, unspecified: Secondary | ICD-10-CM | POA: Diagnosis not present

## 2019-11-28 DIAGNOSIS — I10 Essential (primary) hypertension: Secondary | ICD-10-CM | POA: Diagnosis not present

## 2019-11-28 DIAGNOSIS — F339 Major depressive disorder, recurrent, unspecified: Secondary | ICD-10-CM | POA: Diagnosis not present

## 2019-11-28 DIAGNOSIS — D500 Iron deficiency anemia secondary to blood loss (chronic): Secondary | ICD-10-CM | POA: Diagnosis not present

## 2019-11-28 DIAGNOSIS — M8580 Other specified disorders of bone density and structure, unspecified site: Secondary | ICD-10-CM | POA: Diagnosis not present

## 2019-11-28 DIAGNOSIS — M47816 Spondylosis without myelopathy or radiculopathy, lumbar region: Secondary | ICD-10-CM | POA: Diagnosis not present

## 2019-12-02 DIAGNOSIS — S22059A Unspecified fracture of T5-T6 vertebra, initial encounter for closed fracture: Secondary | ICD-10-CM | POA: Diagnosis not present

## 2019-12-03 DIAGNOSIS — K5901 Slow transit constipation: Secondary | ICD-10-CM | POA: Diagnosis not present

## 2019-12-03 DIAGNOSIS — R109 Unspecified abdominal pain: Secondary | ICD-10-CM | POA: Diagnosis not present

## 2019-12-03 DIAGNOSIS — Z9049 Acquired absence of other specified parts of digestive tract: Secondary | ICD-10-CM | POA: Diagnosis not present

## 2019-12-03 DIAGNOSIS — M546 Pain in thoracic spine: Secondary | ICD-10-CM | POA: Diagnosis not present

## 2019-12-08 DIAGNOSIS — R300 Dysuria: Secondary | ICD-10-CM | POA: Diagnosis not present

## 2019-12-08 DIAGNOSIS — K5900 Constipation, unspecified: Secondary | ICD-10-CM | POA: Diagnosis not present

## 2019-12-09 DIAGNOSIS — S22058G Other fracture of T5-T6 vertebra, subsequent encounter for fracture with delayed healing: Secondary | ICD-10-CM | POA: Diagnosis not present

## 2019-12-09 DIAGNOSIS — Z7189 Other specified counseling: Secondary | ICD-10-CM | POA: Diagnosis not present

## 2019-12-09 DIAGNOSIS — M810 Age-related osteoporosis without current pathological fracture: Secondary | ICD-10-CM | POA: Diagnosis not present

## 2019-12-12 DIAGNOSIS — I712 Thoracic aortic aneurysm, without rupture: Secondary | ICD-10-CM | POA: Diagnosis not present

## 2019-12-12 DIAGNOSIS — R079 Chest pain, unspecified: Secondary | ICD-10-CM | POA: Diagnosis not present

## 2019-12-12 DIAGNOSIS — M545 Low back pain: Secondary | ICD-10-CM | POA: Diagnosis not present

## 2019-12-17 DIAGNOSIS — N390 Urinary tract infection, site not specified: Secondary | ICD-10-CM | POA: Diagnosis not present

## 2019-12-17 DIAGNOSIS — K5909 Other constipation: Secondary | ICD-10-CM | POA: Diagnosis not present

## 2019-12-17 DIAGNOSIS — R1310 Dysphagia, unspecified: Secondary | ICD-10-CM | POA: Diagnosis not present

## 2019-12-25 DIAGNOSIS — M4316 Spondylolisthesis, lumbar region: Secondary | ICD-10-CM | POA: Diagnosis not present

## 2019-12-25 DIAGNOSIS — S22058G Other fracture of T5-T6 vertebra, subsequent encounter for fracture with delayed healing: Secondary | ICD-10-CM | POA: Diagnosis not present

## 2019-12-25 DIAGNOSIS — M5136 Other intervertebral disc degeneration, lumbar region: Secondary | ICD-10-CM | POA: Diagnosis not present

## 2019-12-25 DIAGNOSIS — G894 Chronic pain syndrome: Secondary | ICD-10-CM | POA: Diagnosis not present

## 2019-12-25 DIAGNOSIS — M47816 Spondylosis without myelopathy or radiculopathy, lumbar region: Secondary | ICD-10-CM | POA: Diagnosis not present

## 2020-01-03 DIAGNOSIS — M546 Pain in thoracic spine: Secondary | ICD-10-CM | POA: Diagnosis not present

## 2020-01-03 DIAGNOSIS — S22058G Other fracture of T5-T6 vertebra, subsequent encounter for fracture with delayed healing: Secondary | ICD-10-CM | POA: Diagnosis not present

## 2020-01-05 DIAGNOSIS — R1310 Dysphagia, unspecified: Secondary | ICD-10-CM | POA: Diagnosis not present

## 2020-01-05 DIAGNOSIS — Z01818 Encounter for other preprocedural examination: Secondary | ICD-10-CM | POA: Diagnosis not present

## 2020-01-05 DIAGNOSIS — Z1211 Encounter for screening for malignant neoplasm of colon: Secondary | ICD-10-CM | POA: Diagnosis not present

## 2020-01-08 DIAGNOSIS — S22059A Unspecified fracture of T5-T6 vertebra, initial encounter for closed fracture: Secondary | ICD-10-CM | POA: Diagnosis not present

## 2020-01-08 DIAGNOSIS — M4316 Spondylolisthesis, lumbar region: Secondary | ICD-10-CM | POA: Diagnosis not present

## 2020-01-12 DIAGNOSIS — A048 Other specified bacterial intestinal infections: Secondary | ICD-10-CM | POA: Diagnosis not present

## 2020-01-12 DIAGNOSIS — K2970 Gastritis, unspecified, without bleeding: Secondary | ICD-10-CM | POA: Diagnosis not present

## 2020-01-22 DIAGNOSIS — F339 Major depressive disorder, recurrent, unspecified: Secondary | ICD-10-CM | POA: Diagnosis not present

## 2020-01-22 DIAGNOSIS — D500 Iron deficiency anemia secondary to blood loss (chronic): Secondary | ICD-10-CM | POA: Diagnosis not present

## 2020-01-22 DIAGNOSIS — M8580 Other specified disorders of bone density and structure, unspecified site: Secondary | ICD-10-CM | POA: Diagnosis not present

## 2020-01-22 DIAGNOSIS — E039 Hypothyroidism, unspecified: Secondary | ICD-10-CM | POA: Diagnosis not present

## 2020-01-22 DIAGNOSIS — I10 Essential (primary) hypertension: Secondary | ICD-10-CM | POA: Diagnosis not present

## 2020-01-22 DIAGNOSIS — M47816 Spondylosis without myelopathy or radiculopathy, lumbar region: Secondary | ICD-10-CM | POA: Diagnosis not present

## 2020-01-22 DIAGNOSIS — E785 Hyperlipidemia, unspecified: Secondary | ICD-10-CM | POA: Diagnosis not present

## 2020-01-22 DIAGNOSIS — N189 Chronic kidney disease, unspecified: Secondary | ICD-10-CM | POA: Diagnosis not present

## 2020-01-29 DIAGNOSIS — M2560 Stiffness of unspecified joint, not elsewhere classified: Secondary | ICD-10-CM | POA: Diagnosis not present

## 2020-01-29 DIAGNOSIS — M5440 Lumbago with sciatica, unspecified side: Secondary | ICD-10-CM | POA: Diagnosis not present

## 2020-01-29 DIAGNOSIS — R262 Difficulty in walking, not elsewhere classified: Secondary | ICD-10-CM | POA: Diagnosis not present

## 2020-01-29 DIAGNOSIS — M6281 Muscle weakness (generalized): Secondary | ICD-10-CM | POA: Diagnosis not present

## 2020-02-02 DIAGNOSIS — M5416 Radiculopathy, lumbar region: Secondary | ICD-10-CM | POA: Diagnosis not present

## 2020-02-02 DIAGNOSIS — S22058G Other fracture of T5-T6 vertebra, subsequent encounter for fracture with delayed healing: Secondary | ICD-10-CM | POA: Diagnosis not present

## 2020-02-02 DIAGNOSIS — M5136 Other intervertebral disc degeneration, lumbar region: Secondary | ICD-10-CM | POA: Diagnosis not present

## 2020-02-02 DIAGNOSIS — M47816 Spondylosis without myelopathy or radiculopathy, lumbar region: Secondary | ICD-10-CM | POA: Diagnosis not present

## 2020-02-05 DIAGNOSIS — M6281 Muscle weakness (generalized): Secondary | ICD-10-CM | POA: Diagnosis not present

## 2020-02-05 DIAGNOSIS — M2560 Stiffness of unspecified joint, not elsewhere classified: Secondary | ICD-10-CM | POA: Diagnosis not present

## 2020-02-05 DIAGNOSIS — M5440 Lumbago with sciatica, unspecified side: Secondary | ICD-10-CM | POA: Diagnosis not present

## 2020-02-05 DIAGNOSIS — R262 Difficulty in walking, not elsewhere classified: Secondary | ICD-10-CM | POA: Diagnosis not present

## 2020-02-09 DIAGNOSIS — F3342 Major depressive disorder, recurrent, in full remission: Secondary | ICD-10-CM | POA: Diagnosis not present

## 2020-02-10 DIAGNOSIS — R262 Difficulty in walking, not elsewhere classified: Secondary | ICD-10-CM | POA: Diagnosis not present

## 2020-02-10 DIAGNOSIS — M5440 Lumbago with sciatica, unspecified side: Secondary | ICD-10-CM | POA: Diagnosis not present

## 2020-02-10 DIAGNOSIS — M2560 Stiffness of unspecified joint, not elsewhere classified: Secondary | ICD-10-CM | POA: Diagnosis not present

## 2020-02-10 DIAGNOSIS — M6281 Muscle weakness (generalized): Secondary | ICD-10-CM | POA: Diagnosis not present

## 2020-02-12 DIAGNOSIS — M6281 Muscle weakness (generalized): Secondary | ICD-10-CM | POA: Diagnosis not present

## 2020-02-12 DIAGNOSIS — R262 Difficulty in walking, not elsewhere classified: Secondary | ICD-10-CM | POA: Diagnosis not present

## 2020-02-12 DIAGNOSIS — M5440 Lumbago with sciatica, unspecified side: Secondary | ICD-10-CM | POA: Diagnosis not present

## 2020-02-12 DIAGNOSIS — M2560 Stiffness of unspecified joint, not elsewhere classified: Secondary | ICD-10-CM | POA: Diagnosis not present

## 2020-02-16 DIAGNOSIS — Z7189 Other specified counseling: Secondary | ICD-10-CM | POA: Diagnosis not present

## 2020-02-16 DIAGNOSIS — M810 Age-related osteoporosis without current pathological fracture: Secondary | ICD-10-CM | POA: Diagnosis not present

## 2020-02-18 DIAGNOSIS — K259 Gastric ulcer, unspecified as acute or chronic, without hemorrhage or perforation: Secondary | ICD-10-CM | POA: Diagnosis not present

## 2020-02-18 DIAGNOSIS — K209 Esophagitis, unspecified without bleeding: Secondary | ICD-10-CM | POA: Diagnosis not present

## 2020-02-19 DIAGNOSIS — M2560 Stiffness of unspecified joint, not elsewhere classified: Secondary | ICD-10-CM | POA: Diagnosis not present

## 2020-02-19 DIAGNOSIS — R262 Difficulty in walking, not elsewhere classified: Secondary | ICD-10-CM | POA: Diagnosis not present

## 2020-02-19 DIAGNOSIS — M6281 Muscle weakness (generalized): Secondary | ICD-10-CM | POA: Diagnosis not present

## 2020-02-19 DIAGNOSIS — M5440 Lumbago with sciatica, unspecified side: Secondary | ICD-10-CM | POA: Diagnosis not present

## 2020-02-24 DIAGNOSIS — M5440 Lumbago with sciatica, unspecified side: Secondary | ICD-10-CM | POA: Diagnosis not present

## 2020-02-24 DIAGNOSIS — M6281 Muscle weakness (generalized): Secondary | ICD-10-CM | POA: Diagnosis not present

## 2020-02-24 DIAGNOSIS — M2560 Stiffness of unspecified joint, not elsewhere classified: Secondary | ICD-10-CM | POA: Diagnosis not present

## 2020-02-24 DIAGNOSIS — R262 Difficulty in walking, not elsewhere classified: Secondary | ICD-10-CM | POA: Diagnosis not present

## 2020-02-26 DIAGNOSIS — M6281 Muscle weakness (generalized): Secondary | ICD-10-CM | POA: Diagnosis not present

## 2020-02-26 DIAGNOSIS — M2560 Stiffness of unspecified joint, not elsewhere classified: Secondary | ICD-10-CM | POA: Diagnosis not present

## 2020-02-26 DIAGNOSIS — R262 Difficulty in walking, not elsewhere classified: Secondary | ICD-10-CM | POA: Diagnosis not present

## 2020-02-26 DIAGNOSIS — M5440 Lumbago with sciatica, unspecified side: Secondary | ICD-10-CM | POA: Diagnosis not present

## 2020-02-27 DIAGNOSIS — M47816 Spondylosis without myelopathy or radiculopathy, lumbar region: Secondary | ICD-10-CM | POA: Diagnosis not present

## 2020-02-27 DIAGNOSIS — N189 Chronic kidney disease, unspecified: Secondary | ICD-10-CM | POA: Diagnosis not present

## 2020-02-27 DIAGNOSIS — D500 Iron deficiency anemia secondary to blood loss (chronic): Secondary | ICD-10-CM | POA: Diagnosis not present

## 2020-02-27 DIAGNOSIS — I10 Essential (primary) hypertension: Secondary | ICD-10-CM | POA: Diagnosis not present

## 2020-02-27 DIAGNOSIS — E785 Hyperlipidemia, unspecified: Secondary | ICD-10-CM | POA: Diagnosis not present

## 2020-02-27 DIAGNOSIS — F339 Major depressive disorder, recurrent, unspecified: Secondary | ICD-10-CM | POA: Diagnosis not present

## 2020-02-27 DIAGNOSIS — M8580 Other specified disorders of bone density and structure, unspecified site: Secondary | ICD-10-CM | POA: Diagnosis not present

## 2020-02-27 DIAGNOSIS — E039 Hypothyroidism, unspecified: Secondary | ICD-10-CM | POA: Diagnosis not present

## 2020-03-04 DIAGNOSIS — R262 Difficulty in walking, not elsewhere classified: Secondary | ICD-10-CM | POA: Diagnosis not present

## 2020-03-04 DIAGNOSIS — M2560 Stiffness of unspecified joint, not elsewhere classified: Secondary | ICD-10-CM | POA: Diagnosis not present

## 2020-03-04 DIAGNOSIS — M6281 Muscle weakness (generalized): Secondary | ICD-10-CM | POA: Diagnosis not present

## 2020-03-04 DIAGNOSIS — M5440 Lumbago with sciatica, unspecified side: Secondary | ICD-10-CM | POA: Diagnosis not present

## 2020-03-09 DIAGNOSIS — M6281 Muscle weakness (generalized): Secondary | ICD-10-CM | POA: Diagnosis not present

## 2020-03-09 DIAGNOSIS — R262 Difficulty in walking, not elsewhere classified: Secondary | ICD-10-CM | POA: Diagnosis not present

## 2020-03-09 DIAGNOSIS — M2560 Stiffness of unspecified joint, not elsewhere classified: Secondary | ICD-10-CM | POA: Diagnosis not present

## 2020-03-09 DIAGNOSIS — M5440 Lumbago with sciatica, unspecified side: Secondary | ICD-10-CM | POA: Diagnosis not present

## 2020-03-13 DIAGNOSIS — S6992XA Unspecified injury of left wrist, hand and finger(s), initial encounter: Secondary | ICD-10-CM | POA: Diagnosis not present

## 2020-03-13 DIAGNOSIS — W228XXA Striking against or struck by other objects, initial encounter: Secondary | ICD-10-CM | POA: Diagnosis not present

## 2020-03-13 DIAGNOSIS — S62617A Displaced fracture of proximal phalanx of left little finger, initial encounter for closed fracture: Secondary | ICD-10-CM | POA: Diagnosis not present

## 2020-03-13 DIAGNOSIS — Y998 Other external cause status: Secondary | ICD-10-CM | POA: Diagnosis not present

## 2020-03-15 ENCOUNTER — Ambulatory Visit (INDEPENDENT_AMBULATORY_CARE_PROVIDER_SITE_OTHER): Payer: Medicare Other | Admitting: Psychology

## 2020-03-15 DIAGNOSIS — F411 Generalized anxiety disorder: Secondary | ICD-10-CM | POA: Diagnosis not present

## 2020-03-15 DIAGNOSIS — F331 Major depressive disorder, recurrent, moderate: Secondary | ICD-10-CM

## 2020-03-15 DIAGNOSIS — M79645 Pain in left finger(s): Secondary | ICD-10-CM | POA: Diagnosis not present

## 2020-03-15 DIAGNOSIS — S62617A Displaced fracture of proximal phalanx of left little finger, initial encounter for closed fracture: Secondary | ICD-10-CM | POA: Diagnosis not present

## 2020-03-22 DIAGNOSIS — S62617A Displaced fracture of proximal phalanx of left little finger, initial encounter for closed fracture: Secondary | ICD-10-CM | POA: Diagnosis not present

## 2020-04-05 DIAGNOSIS — S62617A Displaced fracture of proximal phalanx of left little finger, initial encounter for closed fracture: Secondary | ICD-10-CM | POA: Diagnosis not present

## 2020-04-08 DIAGNOSIS — K259 Gastric ulcer, unspecified as acute or chronic, without hemorrhage or perforation: Secondary | ICD-10-CM | POA: Diagnosis not present

## 2020-04-08 DIAGNOSIS — Z01818 Encounter for other preprocedural examination: Secondary | ICD-10-CM | POA: Diagnosis not present

## 2020-04-08 DIAGNOSIS — Z1211 Encounter for screening for malignant neoplasm of colon: Secondary | ICD-10-CM | POA: Diagnosis not present

## 2020-04-09 DIAGNOSIS — Z1211 Encounter for screening for malignant neoplasm of colon: Secondary | ICD-10-CM | POA: Diagnosis not present

## 2020-04-09 DIAGNOSIS — M79672 Pain in left foot: Secondary | ICD-10-CM | POA: Diagnosis not present

## 2020-04-09 DIAGNOSIS — Z01818 Encounter for other preprocedural examination: Secondary | ICD-10-CM | POA: Diagnosis not present

## 2020-04-09 DIAGNOSIS — K259 Gastric ulcer, unspecified as acute or chronic, without hemorrhage or perforation: Secondary | ICD-10-CM | POA: Diagnosis not present

## 2020-04-09 DIAGNOSIS — S62617G Displaced fracture of proximal phalanx of left little finger, subsequent encounter for fracture with delayed healing: Secondary | ICD-10-CM | POA: Diagnosis not present

## 2020-04-09 DIAGNOSIS — M7989 Other specified soft tissue disorders: Secondary | ICD-10-CM | POA: Diagnosis not present

## 2020-04-09 DIAGNOSIS — S92512A Displaced fracture of proximal phalanx of left lesser toe(s), initial encounter for closed fracture: Secondary | ICD-10-CM | POA: Diagnosis not present

## 2020-04-09 DIAGNOSIS — S62617A Displaced fracture of proximal phalanx of left little finger, initial encounter for closed fracture: Secondary | ICD-10-CM | POA: Diagnosis not present

## 2020-04-12 ENCOUNTER — Ambulatory Visit: Payer: Medicare Other | Admitting: Psychology

## 2020-04-13 DIAGNOSIS — S62617G Displaced fracture of proximal phalanx of left little finger, subsequent encounter for fracture with delayed healing: Secondary | ICD-10-CM | POA: Diagnosis not present

## 2020-04-15 DIAGNOSIS — S62617A Displaced fracture of proximal phalanx of left little finger, initial encounter for closed fracture: Secondary | ICD-10-CM | POA: Diagnosis not present

## 2020-04-15 DIAGNOSIS — M8580 Other specified disorders of bone density and structure, unspecified site: Secondary | ICD-10-CM | POA: Diagnosis not present

## 2020-04-20 DIAGNOSIS — D500 Iron deficiency anemia secondary to blood loss (chronic): Secondary | ICD-10-CM | POA: Diagnosis not present

## 2020-04-20 DIAGNOSIS — N189 Chronic kidney disease, unspecified: Secondary | ICD-10-CM | POA: Diagnosis not present

## 2020-04-20 DIAGNOSIS — F339 Major depressive disorder, recurrent, unspecified: Secondary | ICD-10-CM | POA: Diagnosis not present

## 2020-04-20 DIAGNOSIS — M47816 Spondylosis without myelopathy or radiculopathy, lumbar region: Secondary | ICD-10-CM | POA: Diagnosis not present

## 2020-04-20 DIAGNOSIS — E785 Hyperlipidemia, unspecified: Secondary | ICD-10-CM | POA: Diagnosis not present

## 2020-04-20 DIAGNOSIS — E039 Hypothyroidism, unspecified: Secondary | ICD-10-CM | POA: Diagnosis not present

## 2020-04-20 DIAGNOSIS — I10 Essential (primary) hypertension: Secondary | ICD-10-CM | POA: Diagnosis not present

## 2020-04-20 DIAGNOSIS — M8580 Other specified disorders of bone density and structure, unspecified site: Secondary | ICD-10-CM | POA: Diagnosis not present

## 2020-04-30 DIAGNOSIS — S62617G Displaced fracture of proximal phalanx of left little finger, subsequent encounter for fracture with delayed healing: Secondary | ICD-10-CM | POA: Diagnosis not present

## 2020-05-06 DIAGNOSIS — S62617G Displaced fracture of proximal phalanx of left little finger, subsequent encounter for fracture with delayed healing: Secondary | ICD-10-CM | POA: Diagnosis not present

## 2020-05-10 IMAGING — CT CT LUMBAR SPINE WITHOUT CONTRAST
3 of 4 series · 14 of 33 positions shown, 17 images · non-contrast
Comparison: Lumbar radiographs March 24, 2019, lumbar MRI 10/28/2018

CLINICAL DATA: Back pain.  Lumbar fusion 1 week ago

EXAM:
CT LUMBAR SPINE WITHOUT CONTRAST
TECHNIQUE: Multidetector CT imaging of the lumbar spine was performed without
intravenous contrast administration. Multiplanar CT image
reconstructions were also generated.

[Series 6: l spine 2.0 (person_name) (person_name) · axial · 0.38mm/px · z∈[-512,-318]mm · 8 of 115 slices shown, 10 images]
[im 9/115  soft-tissue]
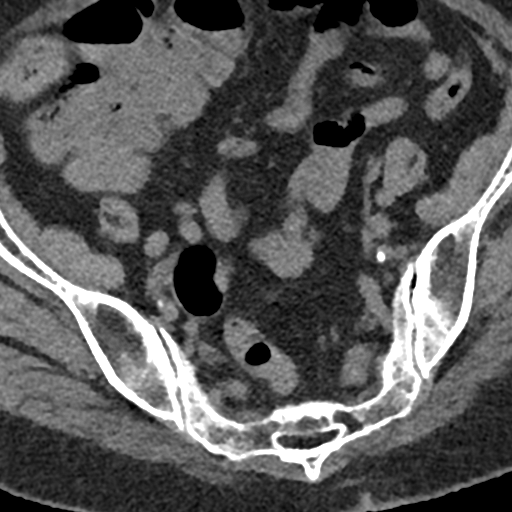
[im 9/115  bone]
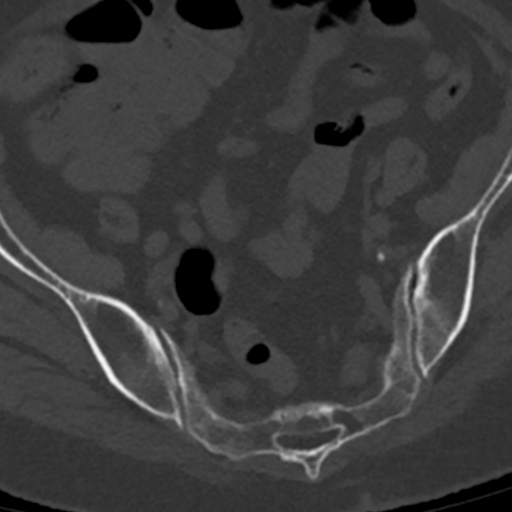
[im 27/115  bone]
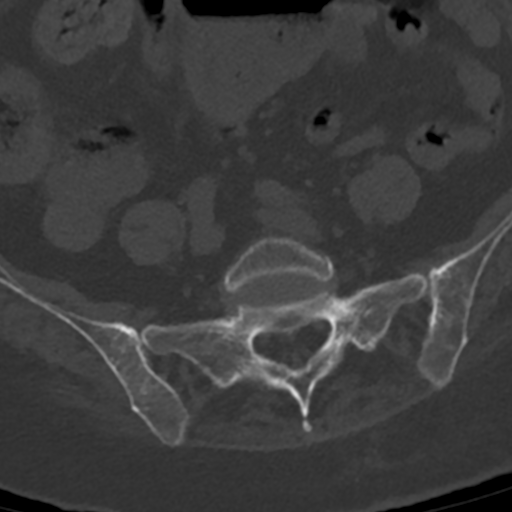
[im 36/115  bone]
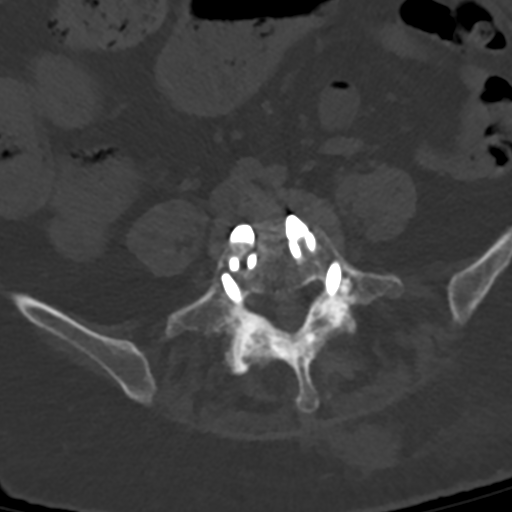
[im 53/115  bone]
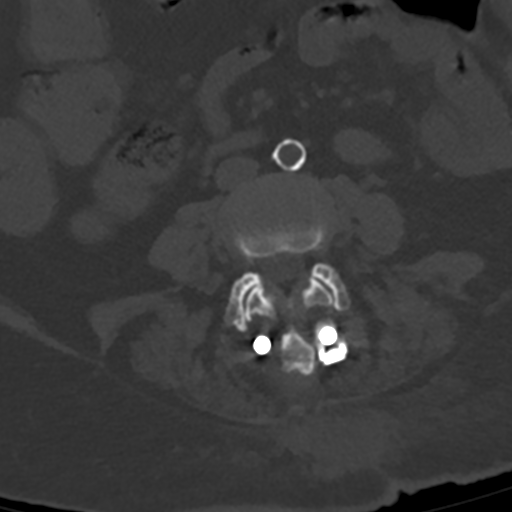
[im 62/115  soft-tissue]
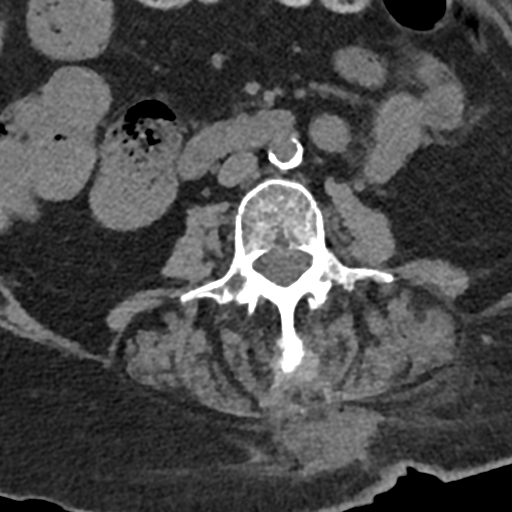
[im 62/115  bone]
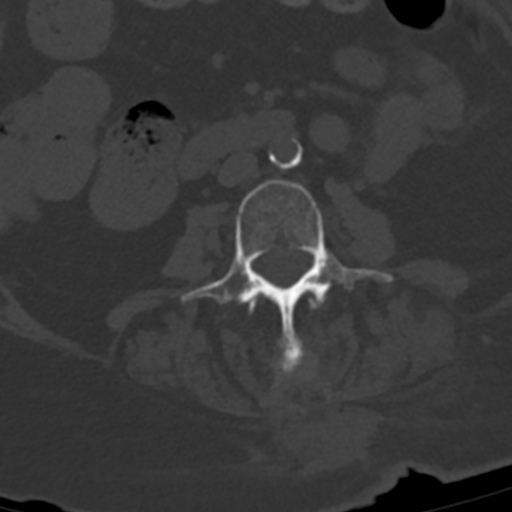
[im 79/115  bone]
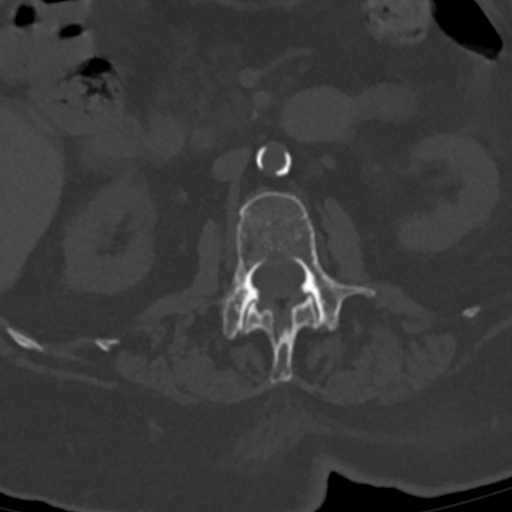
[im 88/115  bone]
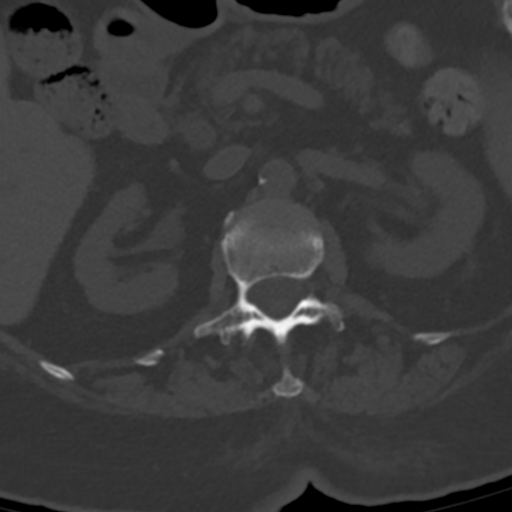
[im 106/115  bone]
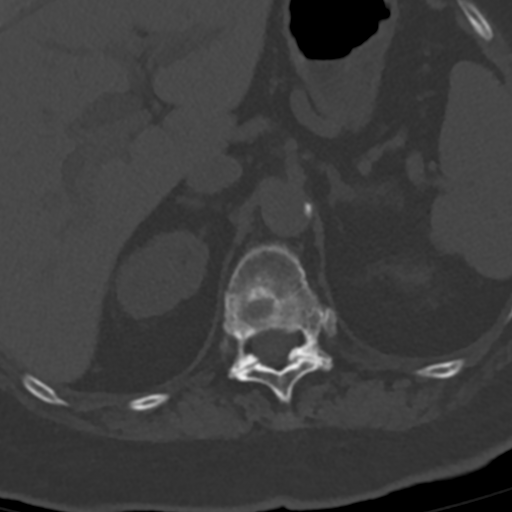

[Series 7: coronal bone · coronal · 0.36mm/px · 1 of 66 slices shown]
[im 33/66  bone]
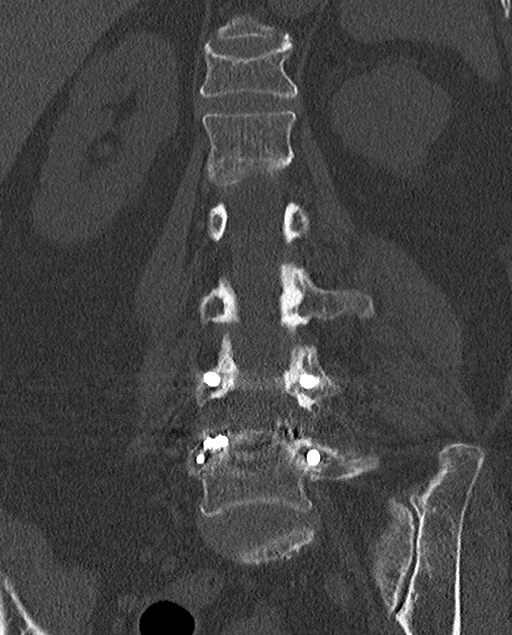

[Series 10: sagittal st · sagittal · 0.36mm/px · 5 of 61 slices shown, 6 images]
[im 21/61  bone]
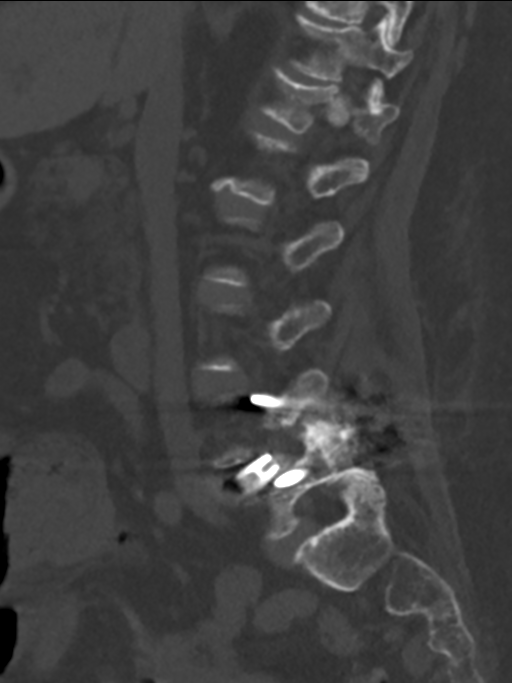
[im 26/61  bone]
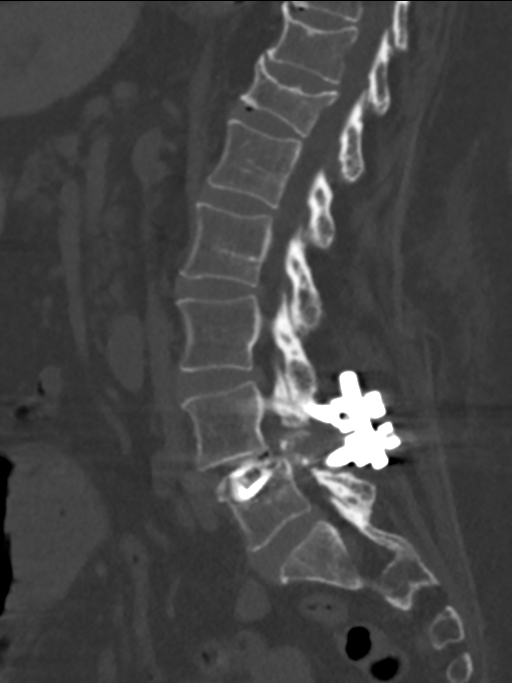
[im 31/61  soft-tissue]
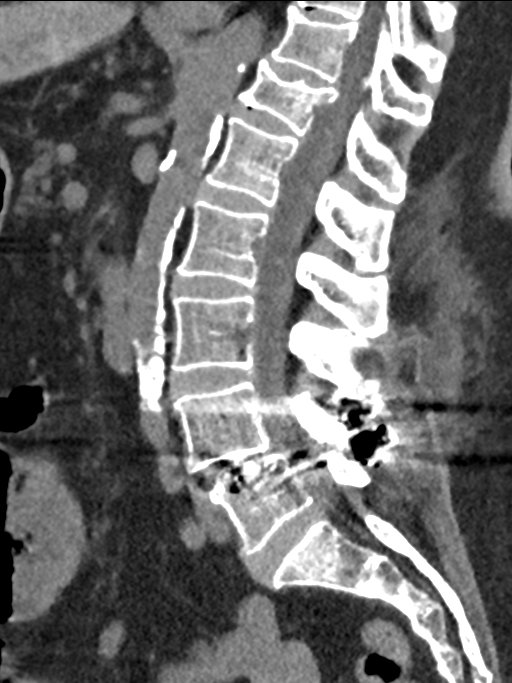
[im 31/61  bone]
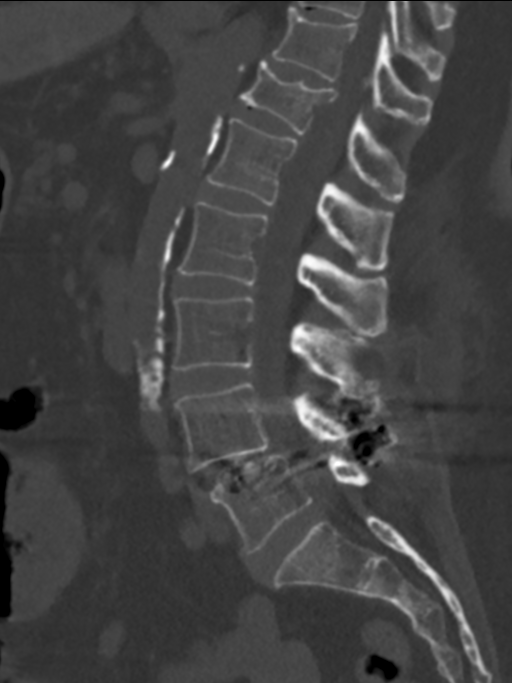
[im 36/61  bone]
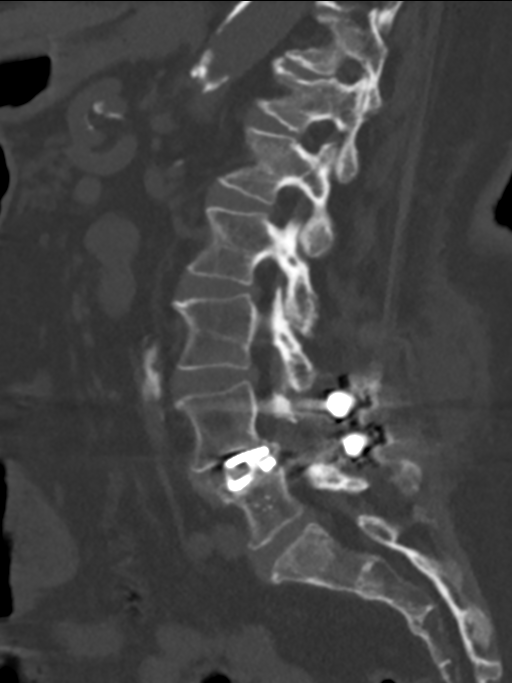
[im 41/61  bone]
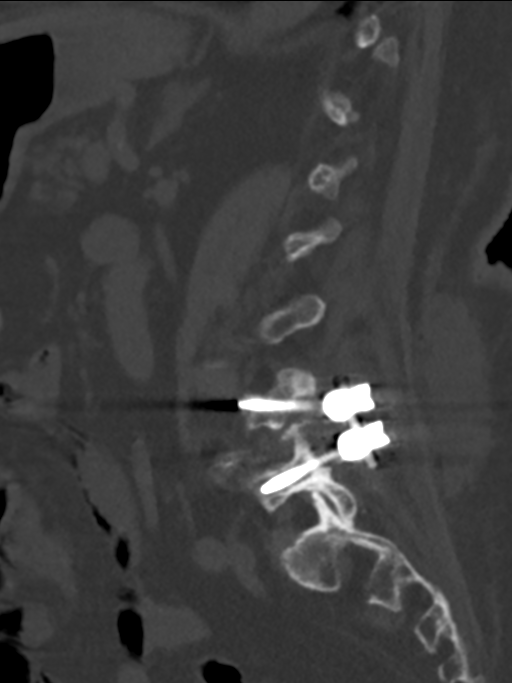

[14 of 33 positions shown; findings below may reference images not displayed]

FINDINGS: Segmentation: Normal

Alignment: 4 mm anterolisthesis L4-5

Vertebrae: Chronic compression fracture T12. Mild chronic
compression fracture T11.

Paraspinal and other soft tissues: Atherosclerotic aorta

Negative for paraspinous mass or edema.

Disc levels: T12-L1: Negative

L1-2: Negative

L2-3: Mild disc bulging without spinal stenosis. Mild facet
degeneration.

L3-4: Mild disc bulging and mild facet degeneration without
significant stenosis

L4-5: 4 mm anterolisthesis. Bilateral pedicle screw fusion in good
position. Bilateral interbody spacers. The right spacer is depressed
into the superior endplate of L5. No other postoperative imaging is
available to determine if this is new.

L5-S1: Mild disc bulging without stenosis.
IMPRESSION: PLIF at L4-5. Subsidence of the right spacer into the superior
endplate of L5. Remaining hardware in satisfactory position. No
fluid collection.

## 2020-05-17 DIAGNOSIS — S62617G Displaced fracture of proximal phalanx of left little finger, subsequent encounter for fracture with delayed healing: Secondary | ICD-10-CM | POA: Diagnosis not present

## 2020-05-24 DIAGNOSIS — E039 Hypothyroidism, unspecified: Secondary | ICD-10-CM | POA: Diagnosis not present

## 2020-05-24 DIAGNOSIS — I10 Essential (primary) hypertension: Secondary | ICD-10-CM | POA: Diagnosis not present

## 2020-05-24 DIAGNOSIS — M47816 Spondylosis without myelopathy or radiculopathy, lumbar region: Secondary | ICD-10-CM | POA: Diagnosis not present

## 2020-05-24 DIAGNOSIS — D500 Iron deficiency anemia secondary to blood loss (chronic): Secondary | ICD-10-CM | POA: Diagnosis not present

## 2020-05-24 DIAGNOSIS — N183 Chronic kidney disease, stage 3 unspecified: Secondary | ICD-10-CM | POA: Diagnosis not present

## 2020-05-24 DIAGNOSIS — M8580 Other specified disorders of bone density and structure, unspecified site: Secondary | ICD-10-CM | POA: Diagnosis not present

## 2020-05-24 DIAGNOSIS — F339 Major depressive disorder, recurrent, unspecified: Secondary | ICD-10-CM | POA: Diagnosis not present

## 2020-05-24 DIAGNOSIS — E785 Hyperlipidemia, unspecified: Secondary | ICD-10-CM | POA: Diagnosis not present

## 2020-05-24 DIAGNOSIS — N189 Chronic kidney disease, unspecified: Secondary | ICD-10-CM | POA: Diagnosis not present

## 2020-05-26 DIAGNOSIS — E785 Hyperlipidemia, unspecified: Secondary | ICD-10-CM | POA: Diagnosis not present

## 2020-05-26 DIAGNOSIS — R7301 Impaired fasting glucose: Secondary | ICD-10-CM | POA: Diagnosis not present

## 2020-05-26 DIAGNOSIS — Z23 Encounter for immunization: Secondary | ICD-10-CM | POA: Diagnosis not present

## 2020-05-26 DIAGNOSIS — I10 Essential (primary) hypertension: Secondary | ICD-10-CM | POA: Diagnosis not present

## 2020-05-26 DIAGNOSIS — N183 Chronic kidney disease, stage 3 unspecified: Secondary | ICD-10-CM | POA: Diagnosis not present

## 2020-05-26 DIAGNOSIS — R8290 Unspecified abnormal findings in urine: Secondary | ICD-10-CM | POA: Diagnosis not present

## 2020-05-26 DIAGNOSIS — Z0000 Encounter for general adult medical examination without abnormal findings: Secondary | ICD-10-CM | POA: Diagnosis not present

## 2020-05-26 DIAGNOSIS — R2681 Unsteadiness on feet: Secondary | ICD-10-CM | POA: Diagnosis not present

## 2020-05-26 DIAGNOSIS — I712 Thoracic aortic aneurysm, without rupture: Secondary | ICD-10-CM | POA: Diagnosis not present

## 2020-05-26 DIAGNOSIS — F339 Major depressive disorder, recurrent, unspecified: Secondary | ICD-10-CM | POA: Diagnosis not present

## 2020-05-26 DIAGNOSIS — Z1211 Encounter for screening for malignant neoplasm of colon: Secondary | ICD-10-CM | POA: Diagnosis not present

## 2020-05-26 DIAGNOSIS — E039 Hypothyroidism, unspecified: Secondary | ICD-10-CM | POA: Diagnosis not present

## 2020-05-28 DIAGNOSIS — S62617G Displaced fracture of proximal phalanx of left little finger, subsequent encounter for fracture with delayed healing: Secondary | ICD-10-CM | POA: Diagnosis not present

## 2020-06-02 DIAGNOSIS — E785 Hyperlipidemia, unspecified: Secondary | ICD-10-CM | POA: Diagnosis not present

## 2020-06-02 DIAGNOSIS — M47816 Spondylosis without myelopathy or radiculopathy, lumbar region: Secondary | ICD-10-CM | POA: Diagnosis not present

## 2020-06-02 DIAGNOSIS — R2681 Unsteadiness on feet: Secondary | ICD-10-CM | POA: Diagnosis not present

## 2020-06-02 DIAGNOSIS — I10 Essential (primary) hypertension: Secondary | ICD-10-CM | POA: Diagnosis not present

## 2020-06-02 DIAGNOSIS — Z23 Encounter for immunization: Secondary | ICD-10-CM | POA: Diagnosis not present

## 2020-06-02 DIAGNOSIS — N183 Chronic kidney disease, stage 3 unspecified: Secondary | ICD-10-CM | POA: Diagnosis not present

## 2020-06-02 DIAGNOSIS — R7301 Impaired fasting glucose: Secondary | ICD-10-CM | POA: Diagnosis not present

## 2020-06-02 DIAGNOSIS — E039 Hypothyroidism, unspecified: Secondary | ICD-10-CM | POA: Diagnosis not present

## 2020-06-02 DIAGNOSIS — F339 Major depressive disorder, recurrent, unspecified: Secondary | ICD-10-CM | POA: Diagnosis not present

## 2020-06-02 DIAGNOSIS — I712 Thoracic aortic aneurysm, without rupture: Secondary | ICD-10-CM | POA: Diagnosis not present

## 2020-06-02 DIAGNOSIS — Z0000 Encounter for general adult medical examination without abnormal findings: Secondary | ICD-10-CM | POA: Diagnosis not present

## 2020-06-02 DIAGNOSIS — Z1211 Encounter for screening for malignant neoplasm of colon: Secondary | ICD-10-CM | POA: Diagnosis not present

## 2020-06-07 DIAGNOSIS — F3342 Major depressive disorder, recurrent, in full remission: Secondary | ICD-10-CM | POA: Diagnosis not present

## 2020-06-08 DIAGNOSIS — M6281 Muscle weakness (generalized): Secondary | ICD-10-CM | POA: Diagnosis not present

## 2020-06-08 DIAGNOSIS — R2681 Unsteadiness on feet: Secondary | ICD-10-CM | POA: Diagnosis not present

## 2020-06-08 DIAGNOSIS — R262 Difficulty in walking, not elsewhere classified: Secondary | ICD-10-CM | POA: Diagnosis not present

## 2020-06-08 DIAGNOSIS — R2689 Other abnormalities of gait and mobility: Secondary | ICD-10-CM | POA: Diagnosis not present

## 2020-06-11 DIAGNOSIS — R2681 Unsteadiness on feet: Secondary | ICD-10-CM | POA: Diagnosis not present

## 2020-06-11 DIAGNOSIS — R262 Difficulty in walking, not elsewhere classified: Secondary | ICD-10-CM | POA: Diagnosis not present

## 2020-06-11 DIAGNOSIS — R2689 Other abnormalities of gait and mobility: Secondary | ICD-10-CM | POA: Diagnosis not present

## 2020-06-11 DIAGNOSIS — M6281 Muscle weakness (generalized): Secondary | ICD-10-CM | POA: Diagnosis not present

## 2020-06-14 DIAGNOSIS — S62617G Displaced fracture of proximal phalanx of left little finger, subsequent encounter for fracture with delayed healing: Secondary | ICD-10-CM | POA: Diagnosis not present

## 2020-06-15 DIAGNOSIS — R2689 Other abnormalities of gait and mobility: Secondary | ICD-10-CM | POA: Diagnosis not present

## 2020-06-15 DIAGNOSIS — R2681 Unsteadiness on feet: Secondary | ICD-10-CM | POA: Diagnosis not present

## 2020-06-15 DIAGNOSIS — N189 Chronic kidney disease, unspecified: Secondary | ICD-10-CM | POA: Diagnosis not present

## 2020-06-15 DIAGNOSIS — I10 Essential (primary) hypertension: Secondary | ICD-10-CM | POA: Diagnosis not present

## 2020-06-15 DIAGNOSIS — F339 Major depressive disorder, recurrent, unspecified: Secondary | ICD-10-CM | POA: Diagnosis not present

## 2020-06-15 DIAGNOSIS — N183 Chronic kidney disease, stage 3 unspecified: Secondary | ICD-10-CM | POA: Diagnosis not present

## 2020-06-15 DIAGNOSIS — E785 Hyperlipidemia, unspecified: Secondary | ICD-10-CM | POA: Diagnosis not present

## 2020-06-15 DIAGNOSIS — R262 Difficulty in walking, not elsewhere classified: Secondary | ICD-10-CM | POA: Diagnosis not present

## 2020-06-15 DIAGNOSIS — M8580 Other specified disorders of bone density and structure, unspecified site: Secondary | ICD-10-CM | POA: Diagnosis not present

## 2020-06-15 DIAGNOSIS — M47816 Spondylosis without myelopathy or radiculopathy, lumbar region: Secondary | ICD-10-CM | POA: Diagnosis not present

## 2020-06-15 DIAGNOSIS — E039 Hypothyroidism, unspecified: Secondary | ICD-10-CM | POA: Diagnosis not present

## 2020-06-15 DIAGNOSIS — D500 Iron deficiency anemia secondary to blood loss (chronic): Secondary | ICD-10-CM | POA: Diagnosis not present

## 2020-06-15 DIAGNOSIS — M6281 Muscle weakness (generalized): Secondary | ICD-10-CM | POA: Diagnosis not present

## 2020-06-18 DIAGNOSIS — R2689 Other abnormalities of gait and mobility: Secondary | ICD-10-CM | POA: Diagnosis not present

## 2020-06-18 DIAGNOSIS — R262 Difficulty in walking, not elsewhere classified: Secondary | ICD-10-CM | POA: Diagnosis not present

## 2020-06-18 DIAGNOSIS — M6281 Muscle weakness (generalized): Secondary | ICD-10-CM | POA: Diagnosis not present

## 2020-06-18 DIAGNOSIS — R2681 Unsteadiness on feet: Secondary | ICD-10-CM | POA: Diagnosis not present

## 2020-06-22 DIAGNOSIS — R2689 Other abnormalities of gait and mobility: Secondary | ICD-10-CM | POA: Diagnosis not present

## 2020-06-22 DIAGNOSIS — R262 Difficulty in walking, not elsewhere classified: Secondary | ICD-10-CM | POA: Diagnosis not present

## 2020-06-22 DIAGNOSIS — M6281 Muscle weakness (generalized): Secondary | ICD-10-CM | POA: Diagnosis not present

## 2020-06-22 DIAGNOSIS — R2681 Unsteadiness on feet: Secondary | ICD-10-CM | POA: Diagnosis not present

## 2020-06-25 DIAGNOSIS — R2689 Other abnormalities of gait and mobility: Secondary | ICD-10-CM | POA: Diagnosis not present

## 2020-06-25 DIAGNOSIS — R262 Difficulty in walking, not elsewhere classified: Secondary | ICD-10-CM | POA: Diagnosis not present

## 2020-06-25 DIAGNOSIS — R2681 Unsteadiness on feet: Secondary | ICD-10-CM | POA: Diagnosis not present

## 2020-06-25 DIAGNOSIS — M6281 Muscle weakness (generalized): Secondary | ICD-10-CM | POA: Diagnosis not present

## 2020-06-29 DIAGNOSIS — R2681 Unsteadiness on feet: Secondary | ICD-10-CM | POA: Diagnosis not present

## 2020-06-29 DIAGNOSIS — R2689 Other abnormalities of gait and mobility: Secondary | ICD-10-CM | POA: Diagnosis not present

## 2020-06-29 DIAGNOSIS — M6281 Muscle weakness (generalized): Secondary | ICD-10-CM | POA: Diagnosis not present

## 2020-06-29 DIAGNOSIS — R262 Difficulty in walking, not elsewhere classified: Secondary | ICD-10-CM | POA: Diagnosis not present

## 2020-07-02 DIAGNOSIS — M6281 Muscle weakness (generalized): Secondary | ICD-10-CM | POA: Diagnosis not present

## 2020-07-02 DIAGNOSIS — R2681 Unsteadiness on feet: Secondary | ICD-10-CM | POA: Diagnosis not present

## 2020-07-02 DIAGNOSIS — R262 Difficulty in walking, not elsewhere classified: Secondary | ICD-10-CM | POA: Diagnosis not present

## 2020-07-02 DIAGNOSIS — R2689 Other abnormalities of gait and mobility: Secondary | ICD-10-CM | POA: Diagnosis not present

## 2020-07-06 DIAGNOSIS — R262 Difficulty in walking, not elsewhere classified: Secondary | ICD-10-CM | POA: Diagnosis not present

## 2020-07-06 DIAGNOSIS — R2681 Unsteadiness on feet: Secondary | ICD-10-CM | POA: Diagnosis not present

## 2020-07-06 DIAGNOSIS — M6281 Muscle weakness (generalized): Secondary | ICD-10-CM | POA: Diagnosis not present

## 2020-07-06 DIAGNOSIS — R2689 Other abnormalities of gait and mobility: Secondary | ICD-10-CM | POA: Diagnosis not present

## 2020-07-09 IMAGING — CT CT LUMBAR SPINE WITHOUT CONTRAST
3 of 5 series · 9 of 33 positions shown, 11 images · non-contrast
Comparison: CT lumbar spine 04/07/2019
COMPARISON: CT lumbar spine 04/07/2019

Addendum:
CLINICAL DATA: 78-year-old female with low back pain.
TECHNIQUE: Multidetector CT imaging of the lumbar spine was performed without
intravenous contrast administration. Multiplanar CT image
reconstructions were also generated.

[Series 3: l-spine 2.00 br40 s3 lspine st · axial · 0.32mm/px · z∈[+1346,+1486]mm · 3 of 105 slices shown, 4 images]
[im 18/105  soft-tissue]
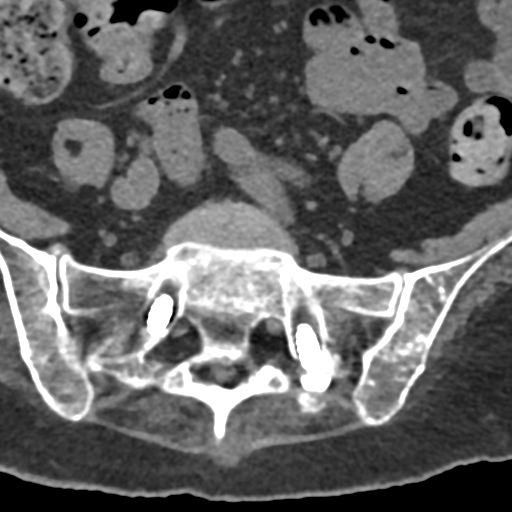
[im 18/105  bone]
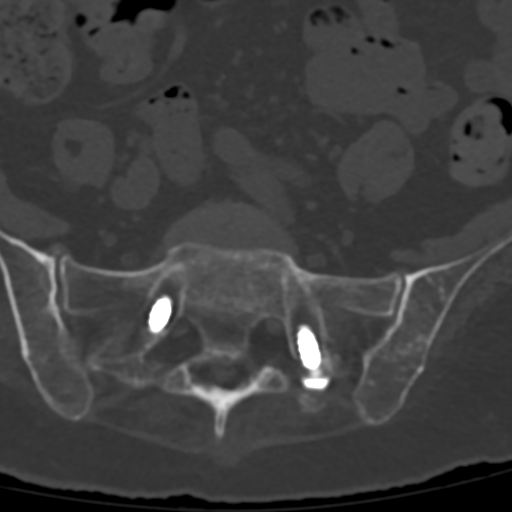
[im 53/105  bone]
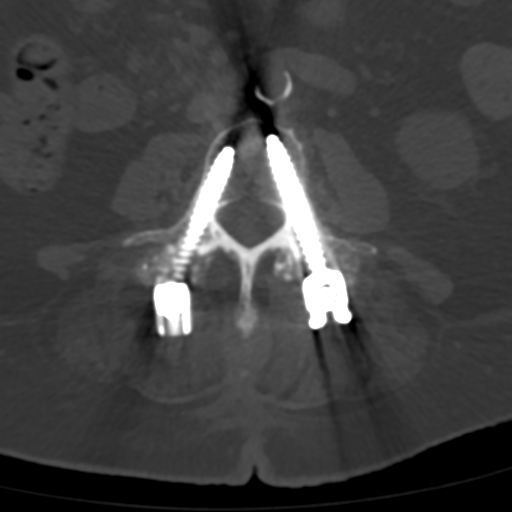
[im 87/105  bone]
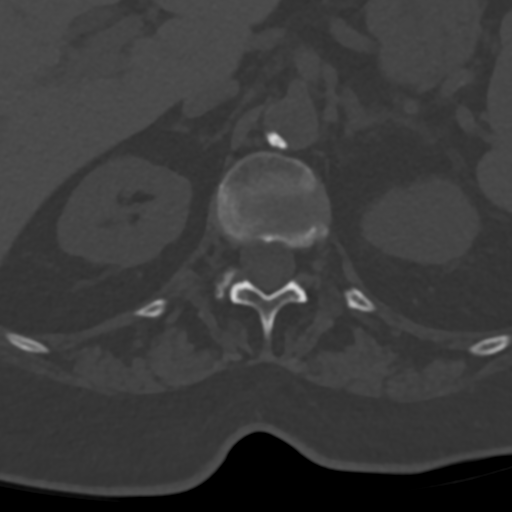

[Series 5: l-spine 2.00 br60 s3 sag bone · sagittal · 0.28mm/px · 5 of 73 slices shown, 6 images]
[im 25/73  bone]
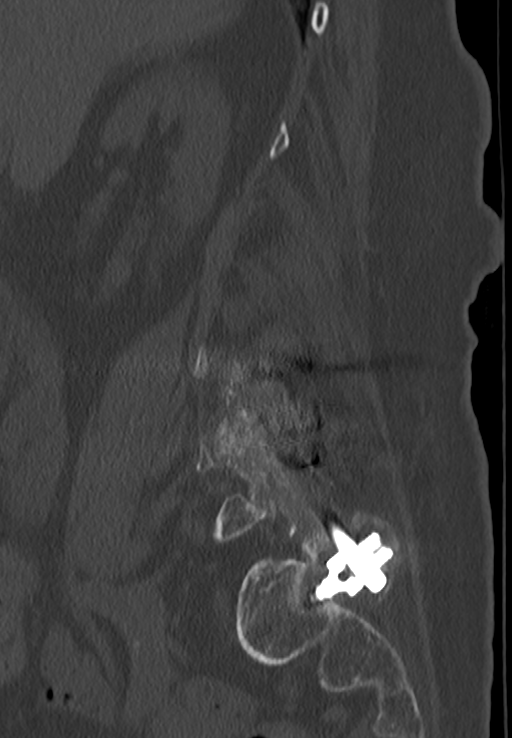
[im 31/73  bone]
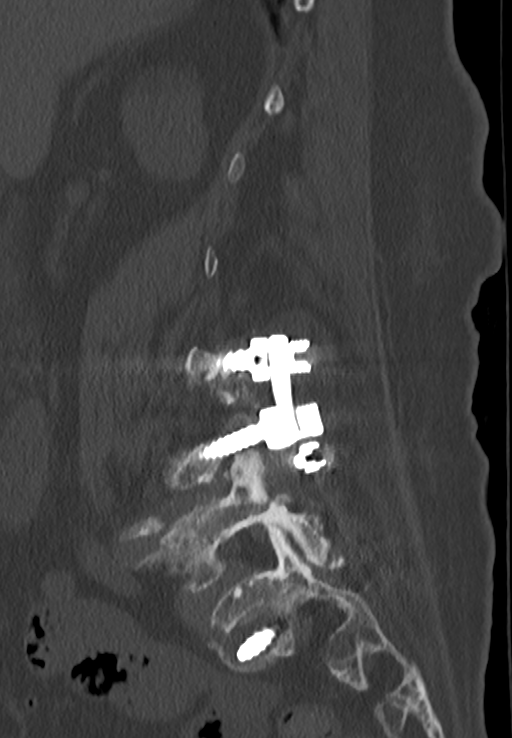
[im 37/73  soft-tissue]
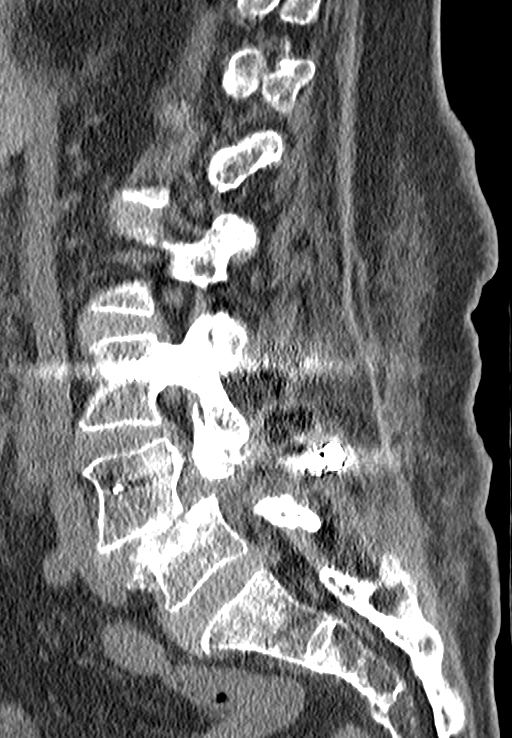
[im 37/73  bone]
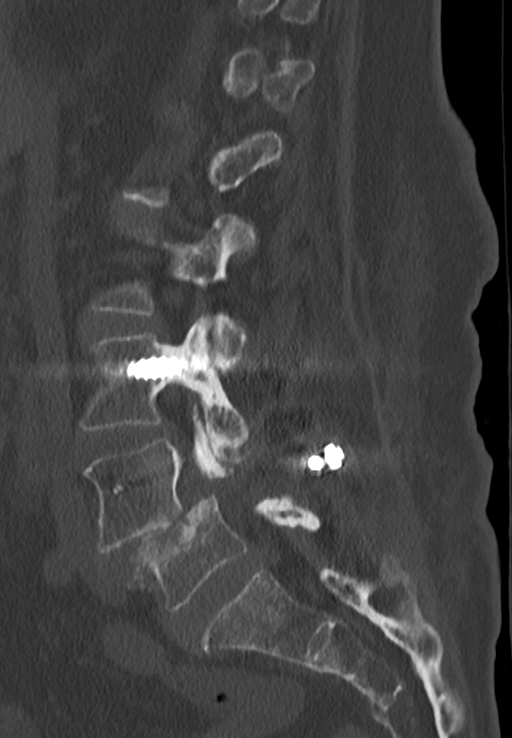
[im 43/73  bone]
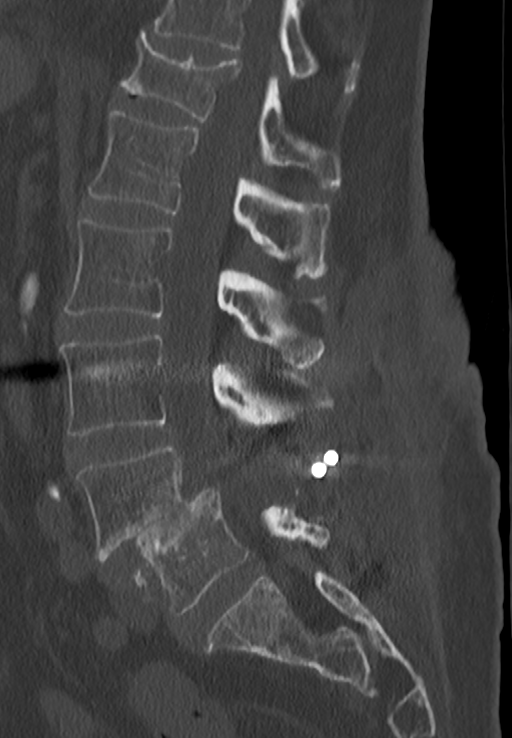
[im 49/73  bone]
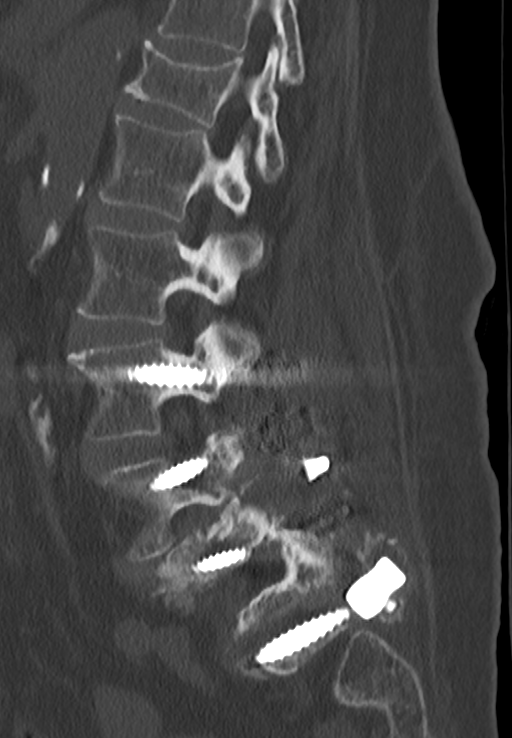

[Series 7: l-spine 2.00 br60 s3 cor bone · coronal · 0.30mm/px · 1 of 72 slices shown]
[im 36/72  bone]
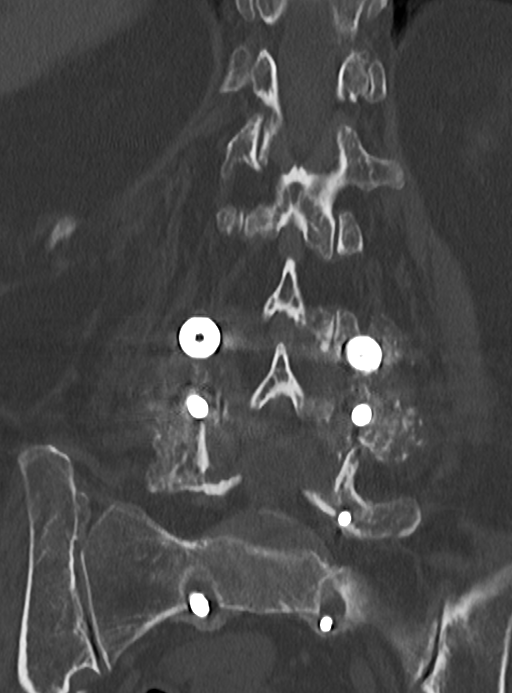

[9 of 33 positions shown; findings below may reference images not displayed]

L5 superior endplate fracture/implant subsidence and bilateral L5
and L4 posterior element fractures in [REDACTED].

Subsequent 04/08/2019 removal of L4-L5 cortical screws and interbody
cages, Redo decompressive laminectomy, pedicle screw fixation L3
through S1.

EXAM:
CT LUMBAR SPINE WITHOUT CONTRAST
FINDINGS: Segmentation: Normal, the same numbering system used on the [facility] CT.

Alignment: 8 millimeters of anterolisthesis of L4 on L5 is stable
from the 04/07/2019 CT. Stable lumbar lordosis elsewhere.

Vertebrae: Osteopenia. Chronic T12 compression. Postoperative
changes from L3 to the sacrum are detailed below.

No acute osseous abnormality at the other levels. The SI joints
remain intact.

Paraspinal and other soft tissues: Calcified aortic atherosclerosis
re-demonstrated. Stable noncontrast abdominal viscera. Postoperative
changes to the posterior paraspinal soft tissues with no adverse
features.

Disc levels:

T11-T12 through L2-L3 are stable.

L3-L4: New bilateral pedicle screws are in place. Hardware appears
intact. No evidence of loosening.

Mild disc bulge. Mild posterior element hypertrophy. No significant
stenosis. No arthrodesis is evident.

L4-L5: New L4 pedicle screws without evidence of loosening. There is
a new unilateral left L5 pedicle screw with lucency along the course
of the screw and a comminuted fracture of the anterior L5 vertebral
body at the tip of the screw. See coronal image 24.

The left L4 pedicle fracture remains evident on coronal image 29.
The left L5 pedicle fracture persists and may have propagated in a
different direction as seen on series 4, image 79 and coronal image
31. The right L5 pedicle fracture is less apparent now on coronal
image 30.

L4 and L5 endplate subsidence has probably not significantly
changed. The posteroinferior L4 body is perched on the L5 superior
endplate as seen on sagittal image 40.

No arthrodesis is evident.

The thecal sac is difficult to delineate at this level, but no
high-grade spinal stenosis is suspected. There does appear to be
left L4 foraminal stenosis due to some small bone fragment or graft
material on sagittal image 49.

L5-S1: Left L5 pedicle screw described above. There is marked
lucency about the new bilateral S1 pedicle screws as seen on coronal
image 37 and series 4, image 91. Mild disc bulge. There might be
developing left side facet ankylosis on sagittal image 48,
uncertain. There is architectural distortion about the proximal left
L5 neural foramen, but no definite stenosis at this level.
IMPRESSION: 1. Loosening of the bilateral S1 pedicle screws, and loosening of
the unilateral left L5 pedicle screw with a new comminuted fracture
of the anterior left L5 body at the tip of the screw (coronal image
24). Also the left L5 pedicle fracture might have propagated in a
different direction since [REDACTED] (coronal image 31). Right L5 pedicle
fracture is less apparent. Questionable developing left side facet
ankylosis at this level (sagittal image 48).
2. New L4 pedicle screws without evidence of loosening. The left L4
pedicle fracture persists. No L4-L5 arthrodesis.
3. New L3 pedicle screws without loosening.
4. Stable L4-L5 anterolisthesis and subsidence of those endplates,
which now contact as seen on sagittal image 41.
5.  Aortic Atherosclerosis ([id number].D).

ADDENDUM:
Study discussed by telephone with medical assistant for Dr. RIKK
DEEQA RAYAAN Ms. Ziming Aizah on 05/23/2019 at 5772 hours, and also Dr.
Turgoose (covering for Dr. Tiger) at [DATE] .

*** End of Addendum ***
L5 superior endplate fracture/implant subsidence and bilateral L5
and L4 posterior element fractures in [REDACTED].

Subsequent 04/08/2019 removal of L4-L5 cortical screws and interbody
cages, Redo decompressive laminectomy, pedicle screw fixation L3
through S1.

EXAM:
CT LUMBAR SPINE WITHOUT CONTRAST
FINDINGS: Segmentation: Normal, the same numbering system used on the [facility] CT.

Alignment: 8 millimeters of anterolisthesis of L4 on L5 is stable
from the 04/07/2019 CT. Stable lumbar lordosis elsewhere.

Vertebrae: Osteopenia. Chronic T12 compression. Postoperative
changes from L3 to the sacrum are detailed below.

No acute osseous abnormality at the other levels. The SI joints
remain intact.

Paraspinal and other soft tissues: Calcified aortic atherosclerosis
re-demonstrated. Stable noncontrast abdominal viscera. Postoperative
changes to the posterior paraspinal soft tissues with no adverse
features.

Disc levels:

T11-T12 through L2-L3 are stable.

L3-L4: New bilateral pedicle screws are in place. Hardware appears
intact. No evidence of loosening.

Mild disc bulge. Mild posterior element hypertrophy. No significant
stenosis. No arthrodesis is evident.

L4-L5: New L4 pedicle screws without evidence of loosening. There is
a new unilateral left L5 pedicle screw with lucency along the course
of the screw and a comminuted fracture of the anterior L5 vertebral
body at the tip of the screw. See coronal image 24.

The left L4 pedicle fracture remains evident on coronal image 29.
The left L5 pedicle fracture persists and may have propagated in a
different direction as seen on series 4, image 79 and coronal image
31. The right L5 pedicle fracture is less apparent now on coronal
image 30.

L4 and L5 endplate subsidence has probably not significantly
changed. The posteroinferior L4 body is perched on the L5 superior
endplate as seen on sagittal image 40.

No arthrodesis is evident.

The thecal sac is difficult to delineate at this level, but no
high-grade spinal stenosis is suspected. There does appear to be
left L4 foraminal stenosis due to some small bone fragment or graft
material on sagittal image 49.

L5-S1: Left L5 pedicle screw described above. There is marked
lucency about the new bilateral S1 pedicle screws as seen on coronal
image 37 and series 4, image 91. Mild disc bulge. There might be
developing left side facet ankylosis on sagittal image 48,
uncertain. There is architectural distortion about the proximal left
L5 neural foramen, but no definite stenosis at this level.
IMPRESSION: 1. Loosening of the bilateral S1 pedicle screws, and loosening of
the unilateral left L5 pedicle screw with a new comminuted fracture
of the anterior left L5 body at the tip of the screw (coronal image
24). Also the left L5 pedicle fracture might have propagated in a
different direction since [REDACTED] (coronal image 31). Right L5 pedicle
fracture is less apparent. Questionable developing left side facet
ankylosis at this level (sagittal image 48).
2. New L4 pedicle screws without evidence of loosening. The left L4
pedicle fracture persists. No L4-L5 arthrodesis.
3. New L3 pedicle screws without loosening.
4. Stable L4-L5 anterolisthesis and subsidence of those endplates,
which now contact as seen on sagittal image 41.
5.  Aortic Atherosclerosis ([id number].D).

## 2020-07-12 DIAGNOSIS — F339 Major depressive disorder, recurrent, unspecified: Secondary | ICD-10-CM | POA: Diagnosis not present

## 2020-07-12 DIAGNOSIS — N189 Chronic kidney disease, unspecified: Secondary | ICD-10-CM | POA: Diagnosis not present

## 2020-07-12 DIAGNOSIS — M47816 Spondylosis without myelopathy or radiculopathy, lumbar region: Secondary | ICD-10-CM | POA: Diagnosis not present

## 2020-07-12 DIAGNOSIS — D500 Iron deficiency anemia secondary to blood loss (chronic): Secondary | ICD-10-CM | POA: Diagnosis not present

## 2020-07-12 DIAGNOSIS — E039 Hypothyroidism, unspecified: Secondary | ICD-10-CM | POA: Diagnosis not present

## 2020-07-12 DIAGNOSIS — N183 Chronic kidney disease, stage 3 unspecified: Secondary | ICD-10-CM | POA: Diagnosis not present

## 2020-07-12 DIAGNOSIS — E785 Hyperlipidemia, unspecified: Secondary | ICD-10-CM | POA: Diagnosis not present

## 2020-07-12 DIAGNOSIS — I10 Essential (primary) hypertension: Secondary | ICD-10-CM | POA: Diagnosis not present

## 2020-07-12 DIAGNOSIS — M8580 Other specified disorders of bone density and structure, unspecified site: Secondary | ICD-10-CM | POA: Diagnosis not present

## 2020-07-13 DIAGNOSIS — M6281 Muscle weakness (generalized): Secondary | ICD-10-CM | POA: Diagnosis not present

## 2020-07-13 DIAGNOSIS — R2681 Unsteadiness on feet: Secondary | ICD-10-CM | POA: Diagnosis not present

## 2020-07-13 DIAGNOSIS — R2689 Other abnormalities of gait and mobility: Secondary | ICD-10-CM | POA: Diagnosis not present

## 2020-07-13 DIAGNOSIS — R262 Difficulty in walking, not elsewhere classified: Secondary | ICD-10-CM | POA: Diagnosis not present

## 2020-07-23 DIAGNOSIS — R2689 Other abnormalities of gait and mobility: Secondary | ICD-10-CM | POA: Diagnosis not present

## 2020-07-23 DIAGNOSIS — M6281 Muscle weakness (generalized): Secondary | ICD-10-CM | POA: Diagnosis not present

## 2020-07-23 DIAGNOSIS — M545 Low back pain, unspecified: Secondary | ICD-10-CM | POA: Diagnosis not present

## 2020-07-23 DIAGNOSIS — Z723 Lack of physical exercise: Secondary | ICD-10-CM | POA: Diagnosis not present

## 2020-07-27 DIAGNOSIS — R2689 Other abnormalities of gait and mobility: Secondary | ICD-10-CM | POA: Diagnosis not present

## 2020-07-27 DIAGNOSIS — M545 Low back pain, unspecified: Secondary | ICD-10-CM | POA: Diagnosis not present

## 2020-07-27 DIAGNOSIS — Z723 Lack of physical exercise: Secondary | ICD-10-CM | POA: Diagnosis not present

## 2020-07-27 DIAGNOSIS — M6281 Muscle weakness (generalized): Secondary | ICD-10-CM | POA: Diagnosis not present

## 2020-07-28 DIAGNOSIS — H02839 Dermatochalasis of unspecified eye, unspecified eyelid: Secondary | ICD-10-CM | POA: Diagnosis not present

## 2020-07-28 DIAGNOSIS — H2513 Age-related nuclear cataract, bilateral: Secondary | ICD-10-CM | POA: Diagnosis not present

## 2020-08-12 DIAGNOSIS — Z23 Encounter for immunization: Secondary | ICD-10-CM | POA: Diagnosis not present

## 2020-08-12 DIAGNOSIS — E785 Hyperlipidemia, unspecified: Secondary | ICD-10-CM | POA: Diagnosis not present

## 2020-08-12 DIAGNOSIS — I10 Essential (primary) hypertension: Secondary | ICD-10-CM | POA: Diagnosis not present

## 2020-08-12 DIAGNOSIS — F339 Major depressive disorder, recurrent, unspecified: Secondary | ICD-10-CM | POA: Diagnosis not present

## 2020-08-12 DIAGNOSIS — N183 Chronic kidney disease, stage 3 unspecified: Secondary | ICD-10-CM | POA: Diagnosis not present

## 2020-08-12 DIAGNOSIS — E039 Hypothyroidism, unspecified: Secondary | ICD-10-CM | POA: Diagnosis not present

## 2020-08-12 DIAGNOSIS — D500 Iron deficiency anemia secondary to blood loss (chronic): Secondary | ICD-10-CM | POA: Diagnosis not present

## 2020-08-12 DIAGNOSIS — M8580 Other specified disorders of bone density and structure, unspecified site: Secondary | ICD-10-CM | POA: Diagnosis not present

## 2020-08-12 DIAGNOSIS — K219 Gastro-esophageal reflux disease without esophagitis: Secondary | ICD-10-CM | POA: Diagnosis not present

## 2020-08-12 DIAGNOSIS — G4700 Insomnia, unspecified: Secondary | ICD-10-CM | POA: Diagnosis not present

## 2020-08-12 DIAGNOSIS — M47816 Spondylosis without myelopathy or radiculopathy, lumbar region: Secondary | ICD-10-CM | POA: Diagnosis not present

## 2020-08-12 DIAGNOSIS — N189 Chronic kidney disease, unspecified: Secondary | ICD-10-CM | POA: Diagnosis not present

## 2020-08-18 ENCOUNTER — Ambulatory Visit (INDEPENDENT_AMBULATORY_CARE_PROVIDER_SITE_OTHER): Payer: Medicare Other | Admitting: Psychology

## 2020-08-18 DIAGNOSIS — F331 Major depressive disorder, recurrent, moderate: Secondary | ICD-10-CM | POA: Diagnosis not present

## 2020-08-18 DIAGNOSIS — F419 Anxiety disorder, unspecified: Secondary | ICD-10-CM

## 2020-08-27 DIAGNOSIS — Z7189 Other specified counseling: Secondary | ICD-10-CM | POA: Diagnosis not present

## 2020-08-27 DIAGNOSIS — M810 Age-related osteoporosis without current pathological fracture: Secondary | ICD-10-CM | POA: Diagnosis not present

## 2020-08-30 DIAGNOSIS — F3342 Major depressive disorder, recurrent, in full remission: Secondary | ICD-10-CM | POA: Diagnosis not present

## 2020-09-03 ENCOUNTER — Ambulatory Visit (INDEPENDENT_AMBULATORY_CARE_PROVIDER_SITE_OTHER): Payer: Medicare Other | Admitting: Psychology

## 2020-09-03 DIAGNOSIS — F331 Major depressive disorder, recurrent, moderate: Secondary | ICD-10-CM | POA: Diagnosis not present

## 2020-09-03 DIAGNOSIS — F419 Anxiety disorder, unspecified: Secondary | ICD-10-CM | POA: Diagnosis not present

## 2020-09-07 DIAGNOSIS — Z23 Encounter for immunization: Secondary | ICD-10-CM | POA: Diagnosis not present

## 2020-09-14 DIAGNOSIS — E039 Hypothyroidism, unspecified: Secondary | ICD-10-CM | POA: Diagnosis not present

## 2020-09-14 DIAGNOSIS — E785 Hyperlipidemia, unspecified: Secondary | ICD-10-CM | POA: Diagnosis not present

## 2020-09-14 DIAGNOSIS — K219 Gastro-esophageal reflux disease without esophagitis: Secondary | ICD-10-CM | POA: Diagnosis not present

## 2020-09-14 DIAGNOSIS — I10 Essential (primary) hypertension: Secondary | ICD-10-CM | POA: Diagnosis not present

## 2020-09-14 DIAGNOSIS — N183 Chronic kidney disease, stage 3 unspecified: Secondary | ICD-10-CM | POA: Diagnosis not present

## 2020-09-14 DIAGNOSIS — F339 Major depressive disorder, recurrent, unspecified: Secondary | ICD-10-CM | POA: Diagnosis not present

## 2020-09-14 DIAGNOSIS — N189 Chronic kidney disease, unspecified: Secondary | ICD-10-CM | POA: Diagnosis not present

## 2020-09-14 DIAGNOSIS — M8580 Other specified disorders of bone density and structure, unspecified site: Secondary | ICD-10-CM | POA: Diagnosis not present

## 2020-09-14 DIAGNOSIS — M47816 Spondylosis without myelopathy or radiculopathy, lumbar region: Secondary | ICD-10-CM | POA: Diagnosis not present

## 2020-09-14 DIAGNOSIS — G4700 Insomnia, unspecified: Secondary | ICD-10-CM | POA: Diagnosis not present

## 2020-09-14 DIAGNOSIS — D500 Iron deficiency anemia secondary to blood loss (chronic): Secondary | ICD-10-CM | POA: Diagnosis not present

## 2020-10-01 DIAGNOSIS — N1831 Chronic kidney disease, stage 3a: Secondary | ICD-10-CM | POA: Diagnosis not present

## 2020-10-01 DIAGNOSIS — M1712 Unilateral primary osteoarthritis, left knee: Secondary | ICD-10-CM | POA: Diagnosis not present

## 2020-10-01 DIAGNOSIS — I129 Hypertensive chronic kidney disease with stage 1 through stage 4 chronic kidney disease, or unspecified chronic kidney disease: Secondary | ICD-10-CM | POA: Diagnosis not present

## 2020-10-01 DIAGNOSIS — M25531 Pain in right wrist: Secondary | ICD-10-CM | POA: Diagnosis not present

## 2020-10-21 DIAGNOSIS — F339 Major depressive disorder, recurrent, unspecified: Secondary | ICD-10-CM | POA: Diagnosis not present

## 2020-10-21 DIAGNOSIS — N189 Chronic kidney disease, unspecified: Secondary | ICD-10-CM | POA: Diagnosis not present

## 2020-10-21 DIAGNOSIS — M8580 Other specified disorders of bone density and structure, unspecified site: Secondary | ICD-10-CM | POA: Diagnosis not present

## 2020-10-21 DIAGNOSIS — E039 Hypothyroidism, unspecified: Secondary | ICD-10-CM | POA: Diagnosis not present

## 2020-10-21 DIAGNOSIS — I10 Essential (primary) hypertension: Secondary | ICD-10-CM | POA: Diagnosis not present

## 2020-10-21 DIAGNOSIS — E785 Hyperlipidemia, unspecified: Secondary | ICD-10-CM | POA: Diagnosis not present

## 2020-10-21 DIAGNOSIS — G4700 Insomnia, unspecified: Secondary | ICD-10-CM | POA: Diagnosis not present

## 2020-10-21 DIAGNOSIS — D500 Iron deficiency anemia secondary to blood loss (chronic): Secondary | ICD-10-CM | POA: Diagnosis not present

## 2020-10-21 DIAGNOSIS — N183 Chronic kidney disease, stage 3 unspecified: Secondary | ICD-10-CM | POA: Diagnosis not present

## 2020-10-21 DIAGNOSIS — M47816 Spondylosis without myelopathy or radiculopathy, lumbar region: Secondary | ICD-10-CM | POA: Diagnosis not present

## 2020-10-21 DIAGNOSIS — K219 Gastro-esophageal reflux disease without esophagitis: Secondary | ICD-10-CM | POA: Diagnosis not present

## 2020-11-03 DIAGNOSIS — M1712 Unilateral primary osteoarthritis, left knee: Secondary | ICD-10-CM | POA: Diagnosis not present

## 2020-11-03 DIAGNOSIS — I10 Essential (primary) hypertension: Secondary | ICD-10-CM | POA: Diagnosis not present

## 2020-11-03 DIAGNOSIS — G8929 Other chronic pain: Secondary | ICD-10-CM | POA: Diagnosis not present

## 2020-11-03 DIAGNOSIS — M25562 Pain in left knee: Secondary | ICD-10-CM | POA: Diagnosis not present

## 2020-11-03 DIAGNOSIS — N1831 Chronic kidney disease, stage 3a: Secondary | ICD-10-CM | POA: Diagnosis not present

## 2020-11-08 DIAGNOSIS — F3342 Major depressive disorder, recurrent, in full remission: Secondary | ICD-10-CM | POA: Diagnosis not present

## 2020-11-16 DIAGNOSIS — N183 Chronic kidney disease, stage 3 unspecified: Secondary | ICD-10-CM | POA: Diagnosis not present

## 2020-11-16 DIAGNOSIS — K219 Gastro-esophageal reflux disease without esophagitis: Secondary | ICD-10-CM | POA: Diagnosis not present

## 2020-11-16 DIAGNOSIS — D500 Iron deficiency anemia secondary to blood loss (chronic): Secondary | ICD-10-CM | POA: Diagnosis not present

## 2020-11-16 DIAGNOSIS — N189 Chronic kidney disease, unspecified: Secondary | ICD-10-CM | POA: Diagnosis not present

## 2020-11-16 DIAGNOSIS — G4700 Insomnia, unspecified: Secondary | ICD-10-CM | POA: Diagnosis not present

## 2020-11-16 DIAGNOSIS — F339 Major depressive disorder, recurrent, unspecified: Secondary | ICD-10-CM | POA: Diagnosis not present

## 2020-11-16 DIAGNOSIS — I10 Essential (primary) hypertension: Secondary | ICD-10-CM | POA: Diagnosis not present

## 2020-11-16 DIAGNOSIS — M8580 Other specified disorders of bone density and structure, unspecified site: Secondary | ICD-10-CM | POA: Diagnosis not present

## 2020-11-16 DIAGNOSIS — E039 Hypothyroidism, unspecified: Secondary | ICD-10-CM | POA: Diagnosis not present

## 2020-11-16 DIAGNOSIS — M47816 Spondylosis without myelopathy or radiculopathy, lumbar region: Secondary | ICD-10-CM | POA: Diagnosis not present

## 2020-11-16 DIAGNOSIS — E785 Hyperlipidemia, unspecified: Secondary | ICD-10-CM | POA: Diagnosis not present

## 2020-11-23 DIAGNOSIS — M1712 Unilateral primary osteoarthritis, left knee: Secondary | ICD-10-CM | POA: Diagnosis not present

## 2020-11-23 DIAGNOSIS — N183 Chronic kidney disease, stage 3 unspecified: Secondary | ICD-10-CM | POA: Diagnosis not present

## 2020-12-06 DIAGNOSIS — M1712 Unilateral primary osteoarthritis, left knee: Secondary | ICD-10-CM | POA: Diagnosis not present

## 2020-12-14 DIAGNOSIS — M25532 Pain in left wrist: Secondary | ICD-10-CM | POA: Diagnosis not present

## 2020-12-14 DIAGNOSIS — M1712 Unilateral primary osteoarthritis, left knee: Secondary | ICD-10-CM | POA: Diagnosis not present

## 2020-12-14 DIAGNOSIS — N1831 Chronic kidney disease, stage 3a: Secondary | ICD-10-CM | POA: Diagnosis not present

## 2020-12-15 DIAGNOSIS — D500 Iron deficiency anemia secondary to blood loss (chronic): Secondary | ICD-10-CM | POA: Diagnosis not present

## 2020-12-15 DIAGNOSIS — M8580 Other specified disorders of bone density and structure, unspecified site: Secondary | ICD-10-CM | POA: Diagnosis not present

## 2020-12-15 DIAGNOSIS — G4700 Insomnia, unspecified: Secondary | ICD-10-CM | POA: Diagnosis not present

## 2020-12-15 DIAGNOSIS — E785 Hyperlipidemia, unspecified: Secondary | ICD-10-CM | POA: Diagnosis not present

## 2020-12-15 DIAGNOSIS — N183 Chronic kidney disease, stage 3 unspecified: Secondary | ICD-10-CM | POA: Diagnosis not present

## 2020-12-15 DIAGNOSIS — E039 Hypothyroidism, unspecified: Secondary | ICD-10-CM | POA: Diagnosis not present

## 2020-12-15 DIAGNOSIS — K219 Gastro-esophageal reflux disease without esophagitis: Secondary | ICD-10-CM | POA: Diagnosis not present

## 2020-12-15 DIAGNOSIS — F339 Major depressive disorder, recurrent, unspecified: Secondary | ICD-10-CM | POA: Diagnosis not present

## 2020-12-15 DIAGNOSIS — I10 Essential (primary) hypertension: Secondary | ICD-10-CM | POA: Diagnosis not present

## 2020-12-24 DIAGNOSIS — M1712 Unilateral primary osteoarthritis, left knee: Secondary | ICD-10-CM | POA: Diagnosis not present

## 2020-12-24 DIAGNOSIS — N1831 Chronic kidney disease, stage 3a: Secondary | ICD-10-CM | POA: Diagnosis not present

## 2020-12-27 DIAGNOSIS — N1831 Chronic kidney disease, stage 3a: Secondary | ICD-10-CM | POA: Diagnosis not present

## 2020-12-27 DIAGNOSIS — M1712 Unilateral primary osteoarthritis, left knee: Secondary | ICD-10-CM | POA: Diagnosis not present

## 2020-12-31 DIAGNOSIS — N1831 Chronic kidney disease, stage 3a: Secondary | ICD-10-CM | POA: Diagnosis not present

## 2020-12-31 DIAGNOSIS — M1712 Unilateral primary osteoarthritis, left knee: Secondary | ICD-10-CM | POA: Diagnosis not present

## 2020-12-31 DIAGNOSIS — M48061 Spinal stenosis, lumbar region without neurogenic claudication: Secondary | ICD-10-CM | POA: Diagnosis not present

## 2021-01-05 DIAGNOSIS — D500 Iron deficiency anemia secondary to blood loss (chronic): Secondary | ICD-10-CM | POA: Diagnosis not present

## 2021-01-05 DIAGNOSIS — E039 Hypothyroidism, unspecified: Secondary | ICD-10-CM | POA: Diagnosis not present

## 2021-01-05 DIAGNOSIS — M8580 Other specified disorders of bone density and structure, unspecified site: Secondary | ICD-10-CM | POA: Diagnosis not present

## 2021-01-05 DIAGNOSIS — G4700 Insomnia, unspecified: Secondary | ICD-10-CM | POA: Diagnosis not present

## 2021-01-05 DIAGNOSIS — N183 Chronic kidney disease, stage 3 unspecified: Secondary | ICD-10-CM | POA: Diagnosis not present

## 2021-01-05 DIAGNOSIS — E785 Hyperlipidemia, unspecified: Secondary | ICD-10-CM | POA: Diagnosis not present

## 2021-01-05 DIAGNOSIS — N189 Chronic kidney disease, unspecified: Secondary | ICD-10-CM | POA: Diagnosis not present

## 2021-01-05 DIAGNOSIS — M47816 Spondylosis without myelopathy or radiculopathy, lumbar region: Secondary | ICD-10-CM | POA: Diagnosis not present

## 2021-01-05 DIAGNOSIS — F339 Major depressive disorder, recurrent, unspecified: Secondary | ICD-10-CM | POA: Diagnosis not present

## 2021-01-05 DIAGNOSIS — I10 Essential (primary) hypertension: Secondary | ICD-10-CM | POA: Diagnosis not present

## 2021-01-05 DIAGNOSIS — K219 Gastro-esophageal reflux disease without esophagitis: Secondary | ICD-10-CM | POA: Diagnosis not present

## 2021-01-12 DIAGNOSIS — M5416 Radiculopathy, lumbar region: Secondary | ICD-10-CM | POA: Diagnosis not present

## 2021-01-12 DIAGNOSIS — S22080D Wedge compression fracture of T11-T12 vertebra, subsequent encounter for fracture with routine healing: Secondary | ICD-10-CM | POA: Diagnosis not present

## 2021-01-12 DIAGNOSIS — M47816 Spondylosis without myelopathy or radiculopathy, lumbar region: Secondary | ICD-10-CM | POA: Diagnosis not present

## 2021-01-12 DIAGNOSIS — M48061 Spinal stenosis, lumbar region without neurogenic claudication: Secondary | ICD-10-CM | POA: Diagnosis not present

## 2021-01-18 DIAGNOSIS — N1831 Chronic kidney disease, stage 3a: Secondary | ICD-10-CM | POA: Diagnosis not present

## 2021-01-18 DIAGNOSIS — M1712 Unilateral primary osteoarthritis, left knee: Secondary | ICD-10-CM | POA: Diagnosis not present

## 2021-01-18 DIAGNOSIS — M5136 Other intervertebral disc degeneration, lumbar region: Secondary | ICD-10-CM | POA: Diagnosis not present

## 2021-01-21 DIAGNOSIS — K219 Gastro-esophageal reflux disease without esophagitis: Secondary | ICD-10-CM | POA: Diagnosis not present

## 2021-02-07 DIAGNOSIS — F3342 Major depressive disorder, recurrent, in full remission: Secondary | ICD-10-CM | POA: Diagnosis not present

## 2021-02-16 DIAGNOSIS — I10 Essential (primary) hypertension: Secondary | ICD-10-CM | POA: Diagnosis not present

## 2021-02-16 DIAGNOSIS — D500 Iron deficiency anemia secondary to blood loss (chronic): Secondary | ICD-10-CM | POA: Diagnosis not present

## 2021-02-16 DIAGNOSIS — N189 Chronic kidney disease, unspecified: Secondary | ICD-10-CM | POA: Diagnosis not present

## 2021-02-16 DIAGNOSIS — N183 Chronic kidney disease, stage 3 unspecified: Secondary | ICD-10-CM | POA: Diagnosis not present

## 2021-02-16 DIAGNOSIS — E039 Hypothyroidism, unspecified: Secondary | ICD-10-CM | POA: Diagnosis not present

## 2021-02-16 DIAGNOSIS — K219 Gastro-esophageal reflux disease without esophagitis: Secondary | ICD-10-CM | POA: Diagnosis not present

## 2021-02-16 DIAGNOSIS — F339 Major depressive disorder, recurrent, unspecified: Secondary | ICD-10-CM | POA: Diagnosis not present

## 2021-02-16 DIAGNOSIS — M8580 Other specified disorders of bone density and structure, unspecified site: Secondary | ICD-10-CM | POA: Diagnosis not present

## 2021-02-16 DIAGNOSIS — G4700 Insomnia, unspecified: Secondary | ICD-10-CM | POA: Diagnosis not present

## 2021-02-16 DIAGNOSIS — E785 Hyperlipidemia, unspecified: Secondary | ICD-10-CM | POA: Diagnosis not present

## 2021-03-02 DIAGNOSIS — N1831 Chronic kidney disease, stage 3a: Secondary | ICD-10-CM | POA: Diagnosis not present

## 2021-03-02 DIAGNOSIS — M1712 Unilateral primary osteoarthritis, left knee: Secondary | ICD-10-CM | POA: Diagnosis not present

## 2021-03-02 DIAGNOSIS — M5136 Other intervertebral disc degeneration, lumbar region: Secondary | ICD-10-CM | POA: Diagnosis not present

## 2021-03-07 DIAGNOSIS — Z7189 Other specified counseling: Secondary | ICD-10-CM | POA: Diagnosis not present

## 2021-03-07 DIAGNOSIS — M810 Age-related osteoporosis without current pathological fracture: Secondary | ICD-10-CM | POA: Diagnosis not present

## 2021-03-07 DIAGNOSIS — N1831 Chronic kidney disease, stage 3a: Secondary | ICD-10-CM | POA: Diagnosis not present

## 2021-03-08 DIAGNOSIS — Z7189 Other specified counseling: Secondary | ICD-10-CM | POA: Diagnosis not present

## 2021-03-08 DIAGNOSIS — M810 Age-related osteoporosis without current pathological fracture: Secondary | ICD-10-CM | POA: Diagnosis not present

## 2021-03-08 DIAGNOSIS — N1831 Chronic kidney disease, stage 3a: Secondary | ICD-10-CM | POA: Diagnosis not present

## 2021-03-15 DIAGNOSIS — M4316 Spondylolisthesis, lumbar region: Secondary | ICD-10-CM | POA: Diagnosis not present

## 2021-03-15 DIAGNOSIS — M545 Low back pain, unspecified: Secondary | ICD-10-CM | POA: Diagnosis not present

## 2021-03-15 DIAGNOSIS — M4727 Other spondylosis with radiculopathy, lumbosacral region: Secondary | ICD-10-CM | POA: Diagnosis not present

## 2021-03-15 DIAGNOSIS — M48061 Spinal stenosis, lumbar region without neurogenic claudication: Secondary | ICD-10-CM | POA: Diagnosis not present

## 2021-03-15 DIAGNOSIS — Z9889 Other specified postprocedural states: Secondary | ICD-10-CM | POA: Diagnosis not present

## 2021-03-15 DIAGNOSIS — M5116 Intervertebral disc disorders with radiculopathy, lumbar region: Secondary | ICD-10-CM | POA: Diagnosis not present

## 2021-03-29 DIAGNOSIS — N1831 Chronic kidney disease, stage 3a: Secondary | ICD-10-CM | POA: Diagnosis not present

## 2021-03-29 DIAGNOSIS — R799 Abnormal finding of blood chemistry, unspecified: Secondary | ICD-10-CM | POA: Diagnosis not present

## 2021-03-29 DIAGNOSIS — Z01818 Encounter for other preprocedural examination: Secondary | ICD-10-CM | POA: Diagnosis not present

## 2021-04-03 DIAGNOSIS — Z20822 Contact with and (suspected) exposure to covid-19: Secondary | ICD-10-CM | POA: Diagnosis not present

## 2021-04-06 DIAGNOSIS — N183 Chronic kidney disease, stage 3 unspecified: Secondary | ICD-10-CM | POA: Diagnosis not present

## 2021-04-06 DIAGNOSIS — Z01812 Encounter for preprocedural laboratory examination: Secondary | ICD-10-CM | POA: Diagnosis not present

## 2021-04-06 DIAGNOSIS — I129 Hypertensive chronic kidney disease with stage 1 through stage 4 chronic kidney disease, or unspecified chronic kidney disease: Secondary | ICD-10-CM | POA: Diagnosis not present

## 2021-04-06 DIAGNOSIS — M1712 Unilateral primary osteoarthritis, left knee: Secondary | ICD-10-CM | POA: Diagnosis not present

## 2021-04-06 DIAGNOSIS — R9431 Abnormal electrocardiogram [ECG] [EKG]: Secondary | ICD-10-CM | POA: Diagnosis not present

## 2021-04-06 DIAGNOSIS — Z01810 Encounter for preprocedural cardiovascular examination: Secondary | ICD-10-CM | POA: Diagnosis not present

## 2021-04-07 DIAGNOSIS — Z7189 Other specified counseling: Secondary | ICD-10-CM | POA: Diagnosis not present

## 2021-04-18 DIAGNOSIS — E213 Hyperparathyroidism, unspecified: Secondary | ICD-10-CM | POA: Diagnosis not present

## 2021-04-18 DIAGNOSIS — I129 Hypertensive chronic kidney disease with stage 1 through stage 4 chronic kidney disease, or unspecified chronic kidney disease: Secondary | ICD-10-CM | POA: Diagnosis not present

## 2021-04-18 DIAGNOSIS — R011 Cardiac murmur, unspecified: Secondary | ICD-10-CM | POA: Diagnosis not present

## 2021-04-18 DIAGNOSIS — F419 Anxiety disorder, unspecified: Secondary | ICD-10-CM | POA: Diagnosis not present

## 2021-04-18 DIAGNOSIS — E039 Hypothyroidism, unspecified: Secondary | ICD-10-CM | POA: Diagnosis not present

## 2021-04-18 DIAGNOSIS — N1831 Chronic kidney disease, stage 3a: Secondary | ICD-10-CM | POA: Diagnosis not present

## 2021-04-18 DIAGNOSIS — M1712 Unilateral primary osteoarthritis, left knee: Secondary | ICD-10-CM | POA: Diagnosis not present

## 2021-04-18 DIAGNOSIS — R279 Unspecified lack of coordination: Secondary | ICD-10-CM | POA: Diagnosis not present

## 2021-04-18 DIAGNOSIS — Z743 Need for continuous supervision: Secondary | ICD-10-CM | POA: Diagnosis not present

## 2021-04-18 DIAGNOSIS — Z9181 History of falling: Secondary | ICD-10-CM | POA: Diagnosis not present

## 2021-04-18 DIAGNOSIS — F329 Major depressive disorder, single episode, unspecified: Secondary | ICD-10-CM | POA: Diagnosis not present

## 2021-04-18 DIAGNOSIS — M810 Age-related osteoporosis without current pathological fracture: Secondary | ICD-10-CM | POA: Diagnosis not present

## 2021-04-18 DIAGNOSIS — Z8249 Family history of ischemic heart disease and other diseases of the circulatory system: Secondary | ICD-10-CM | POA: Diagnosis not present

## 2021-04-18 DIAGNOSIS — I10 Essential (primary) hypertension: Secondary | ICD-10-CM | POA: Diagnosis not present

## 2021-04-18 DIAGNOSIS — M6281 Muscle weakness (generalized): Secondary | ICD-10-CM | POA: Diagnosis not present

## 2021-04-18 DIAGNOSIS — F418 Other specified anxiety disorders: Secondary | ICD-10-CM | POA: Diagnosis not present

## 2021-04-18 DIAGNOSIS — Z471 Aftercare following joint replacement surgery: Secondary | ICD-10-CM | POA: Diagnosis not present

## 2021-04-18 DIAGNOSIS — R41841 Cognitive communication deficit: Secondary | ICD-10-CM | POA: Diagnosis not present

## 2021-04-18 DIAGNOSIS — K219 Gastro-esophageal reflux disease without esophagitis: Secondary | ICD-10-CM | POA: Diagnosis not present

## 2021-04-18 DIAGNOSIS — N183 Chronic kidney disease, stage 3 unspecified: Secondary | ICD-10-CM | POA: Diagnosis not present

## 2021-04-18 DIAGNOSIS — R2689 Other abnormalities of gait and mobility: Secondary | ICD-10-CM | POA: Diagnosis not present

## 2021-04-18 DIAGNOSIS — E7800 Pure hypercholesterolemia, unspecified: Secondary | ICD-10-CM | POA: Diagnosis not present

## 2021-04-18 DIAGNOSIS — Z96652 Presence of left artificial knee joint: Secondary | ICD-10-CM | POA: Diagnosis not present

## 2021-04-18 DIAGNOSIS — R0902 Hypoxemia: Secondary | ICD-10-CM | POA: Diagnosis not present

## 2021-04-18 DIAGNOSIS — G8918 Other acute postprocedural pain: Secondary | ICD-10-CM | POA: Diagnosis not present

## 2021-04-21 DIAGNOSIS — E785 Hyperlipidemia, unspecified: Secondary | ICD-10-CM | POA: Diagnosis not present

## 2021-04-21 DIAGNOSIS — Z96652 Presence of left artificial knee joint: Secondary | ICD-10-CM | POA: Diagnosis not present

## 2021-04-21 DIAGNOSIS — M1712 Unilateral primary osteoarthritis, left knee: Secondary | ICD-10-CM | POA: Diagnosis not present

## 2021-04-21 DIAGNOSIS — R41841 Cognitive communication deficit: Secondary | ICD-10-CM | POA: Diagnosis not present

## 2021-04-21 DIAGNOSIS — R0902 Hypoxemia: Secondary | ICD-10-CM | POA: Diagnosis not present

## 2021-04-21 DIAGNOSIS — Z743 Need for continuous supervision: Secondary | ICD-10-CM | POA: Diagnosis not present

## 2021-04-21 DIAGNOSIS — M4316 Spondylolisthesis, lumbar region: Secondary | ICD-10-CM | POA: Diagnosis not present

## 2021-04-21 DIAGNOSIS — R0602 Shortness of breath: Secondary | ICD-10-CM | POA: Diagnosis not present

## 2021-04-21 DIAGNOSIS — N1831 Chronic kidney disease, stage 3a: Secondary | ICD-10-CM | POA: Diagnosis not present

## 2021-04-21 DIAGNOSIS — I10 Essential (primary) hypertension: Secondary | ICD-10-CM | POA: Diagnosis not present

## 2021-04-21 DIAGNOSIS — F418 Other specified anxiety disorders: Secondary | ICD-10-CM | POA: Diagnosis not present

## 2021-04-21 DIAGNOSIS — U071 COVID-19: Secondary | ICD-10-CM | POA: Diagnosis not present

## 2021-04-21 DIAGNOSIS — I129 Hypertensive chronic kidney disease with stage 1 through stage 4 chronic kidney disease, or unspecified chronic kidney disease: Secondary | ICD-10-CM | POA: Diagnosis not present

## 2021-04-21 DIAGNOSIS — F3342 Major depressive disorder, recurrent, in full remission: Secondary | ICD-10-CM | POA: Diagnosis not present

## 2021-04-21 DIAGNOSIS — Z471 Aftercare following joint replacement surgery: Secondary | ICD-10-CM | POA: Diagnosis not present

## 2021-04-21 DIAGNOSIS — M810 Age-related osteoporosis without current pathological fracture: Secondary | ICD-10-CM | POA: Diagnosis not present

## 2021-04-21 DIAGNOSIS — R2689 Other abnormalities of gait and mobility: Secondary | ICD-10-CM | POA: Diagnosis not present

## 2021-04-21 DIAGNOSIS — R059 Cough, unspecified: Secondary | ICD-10-CM | POA: Diagnosis not present

## 2021-04-21 DIAGNOSIS — F32A Depression, unspecified: Secondary | ICD-10-CM | POA: Diagnosis not present

## 2021-04-21 DIAGNOSIS — F419 Anxiety disorder, unspecified: Secondary | ICD-10-CM | POA: Diagnosis not present

## 2021-04-21 DIAGNOSIS — R279 Unspecified lack of coordination: Secondary | ICD-10-CM | POA: Diagnosis not present

## 2021-04-21 DIAGNOSIS — E039 Hypothyroidism, unspecified: Secondary | ICD-10-CM | POA: Diagnosis not present

## 2021-04-21 DIAGNOSIS — M6281 Muscle weakness (generalized): Secondary | ICD-10-CM | POA: Diagnosis not present

## 2021-04-28 DIAGNOSIS — E039 Hypothyroidism, unspecified: Secondary | ICD-10-CM | POA: Diagnosis not present

## 2021-04-28 DIAGNOSIS — E785 Hyperlipidemia, unspecified: Secondary | ICD-10-CM | POA: Diagnosis not present

## 2021-04-28 DIAGNOSIS — I10 Essential (primary) hypertension: Secondary | ICD-10-CM | POA: Diagnosis not present

## 2021-04-28 DIAGNOSIS — Z96652 Presence of left artificial knee joint: Secondary | ICD-10-CM | POA: Diagnosis not present

## 2021-04-28 DIAGNOSIS — Z471 Aftercare following joint replacement surgery: Secondary | ICD-10-CM | POA: Diagnosis not present

## 2021-04-28 DIAGNOSIS — F419 Anxiety disorder, unspecified: Secondary | ICD-10-CM | POA: Diagnosis not present

## 2021-04-28 DIAGNOSIS — M1712 Unilateral primary osteoarthritis, left knee: Secondary | ICD-10-CM | POA: Diagnosis not present

## 2021-04-28 DIAGNOSIS — F32A Depression, unspecified: Secondary | ICD-10-CM | POA: Diagnosis not present

## 2021-04-28 DIAGNOSIS — M4316 Spondylolisthesis, lumbar region: Secondary | ICD-10-CM | POA: Diagnosis not present

## 2021-05-03 DIAGNOSIS — R0602 Shortness of breath: Secondary | ICD-10-CM | POA: Diagnosis not present

## 2021-05-03 DIAGNOSIS — R059 Cough, unspecified: Secondary | ICD-10-CM | POA: Diagnosis not present

## 2021-05-09 DIAGNOSIS — F3342 Major depressive disorder, recurrent, in full remission: Secondary | ICD-10-CM | POA: Diagnosis not present

## 2021-05-12 DIAGNOSIS — M6281 Muscle weakness (generalized): Secondary | ICD-10-CM | POA: Diagnosis not present

## 2021-05-12 DIAGNOSIS — M25562 Pain in left knee: Secondary | ICD-10-CM | POA: Diagnosis not present

## 2021-05-12 DIAGNOSIS — M25662 Stiffness of left knee, not elsewhere classified: Secondary | ICD-10-CM | POA: Diagnosis not present

## 2021-05-12 DIAGNOSIS — Z96652 Presence of left artificial knee joint: Secondary | ICD-10-CM | POA: Diagnosis not present

## 2021-05-18 DIAGNOSIS — E782 Mixed hyperlipidemia: Secondary | ICD-10-CM | POA: Diagnosis not present

## 2021-05-18 DIAGNOSIS — H6123 Impacted cerumen, bilateral: Secondary | ICD-10-CM | POA: Diagnosis not present

## 2021-05-18 DIAGNOSIS — Z6824 Body mass index (BMI) 24.0-24.9, adult: Secondary | ICD-10-CM | POA: Diagnosis not present

## 2021-05-18 DIAGNOSIS — H9202 Otalgia, left ear: Secondary | ICD-10-CM | POA: Diagnosis not present

## 2021-05-18 DIAGNOSIS — H6122 Impacted cerumen, left ear: Secondary | ICD-10-CM | POA: Diagnosis not present

## 2021-05-18 DIAGNOSIS — I10 Essential (primary) hypertension: Secondary | ICD-10-CM | POA: Diagnosis not present

## 2021-05-23 DIAGNOSIS — M25662 Stiffness of left knee, not elsewhere classified: Secondary | ICD-10-CM | POA: Diagnosis not present

## 2021-05-23 DIAGNOSIS — Z96652 Presence of left artificial knee joint: Secondary | ICD-10-CM | POA: Diagnosis not present

## 2021-05-23 DIAGNOSIS — M25562 Pain in left knee: Secondary | ICD-10-CM | POA: Diagnosis not present

## 2021-05-23 DIAGNOSIS — M6281 Muscle weakness (generalized): Secondary | ICD-10-CM | POA: Diagnosis not present

## 2021-05-24 DIAGNOSIS — S8992XA Unspecified injury of left lower leg, initial encounter: Secondary | ICD-10-CM | POA: Diagnosis not present

## 2021-05-27 DIAGNOSIS — M25562 Pain in left knee: Secondary | ICD-10-CM | POA: Diagnosis not present

## 2021-05-27 DIAGNOSIS — Z96652 Presence of left artificial knee joint: Secondary | ICD-10-CM | POA: Diagnosis not present

## 2021-05-27 DIAGNOSIS — M25662 Stiffness of left knee, not elsewhere classified: Secondary | ICD-10-CM | POA: Diagnosis not present

## 2021-05-27 DIAGNOSIS — M6281 Muscle weakness (generalized): Secondary | ICD-10-CM | POA: Diagnosis not present

## 2021-05-30 DIAGNOSIS — Z96652 Presence of left artificial knee joint: Secondary | ICD-10-CM | POA: Diagnosis not present

## 2021-05-30 DIAGNOSIS — M25662 Stiffness of left knee, not elsewhere classified: Secondary | ICD-10-CM | POA: Diagnosis not present

## 2021-05-30 DIAGNOSIS — M25562 Pain in left knee: Secondary | ICD-10-CM | POA: Diagnosis not present

## 2021-05-30 DIAGNOSIS — M6281 Muscle weakness (generalized): Secondary | ICD-10-CM | POA: Diagnosis not present

## 2021-06-01 DIAGNOSIS — Z23 Encounter for immunization: Secondary | ICD-10-CM | POA: Diagnosis not present

## 2021-06-02 DIAGNOSIS — M6281 Muscle weakness (generalized): Secondary | ICD-10-CM | POA: Diagnosis not present

## 2021-06-02 DIAGNOSIS — M25562 Pain in left knee: Secondary | ICD-10-CM | POA: Diagnosis not present

## 2021-06-02 DIAGNOSIS — M25662 Stiffness of left knee, not elsewhere classified: Secondary | ICD-10-CM | POA: Diagnosis not present

## 2021-06-02 DIAGNOSIS — Z96652 Presence of left artificial knee joint: Secondary | ICD-10-CM | POA: Diagnosis not present

## 2021-06-07 DIAGNOSIS — F3342 Major depressive disorder, recurrent, in full remission: Secondary | ICD-10-CM | POA: Diagnosis not present

## 2021-06-08 DIAGNOSIS — Z20822 Contact with and (suspected) exposure to covid-19: Secondary | ICD-10-CM | POA: Diagnosis not present

## 2021-06-09 DIAGNOSIS — M25662 Stiffness of left knee, not elsewhere classified: Secondary | ICD-10-CM | POA: Diagnosis not present

## 2021-06-09 DIAGNOSIS — M6281 Muscle weakness (generalized): Secondary | ICD-10-CM | POA: Diagnosis not present

## 2021-06-09 DIAGNOSIS — Z96652 Presence of left artificial knee joint: Secondary | ICD-10-CM | POA: Diagnosis not present

## 2021-06-09 DIAGNOSIS — M25562 Pain in left knee: Secondary | ICD-10-CM | POA: Diagnosis not present

## 2021-06-16 DIAGNOSIS — Z96652 Presence of left artificial knee joint: Secondary | ICD-10-CM | POA: Diagnosis not present

## 2021-06-16 DIAGNOSIS — M6281 Muscle weakness (generalized): Secondary | ICD-10-CM | POA: Diagnosis not present

## 2021-06-16 DIAGNOSIS — M25662 Stiffness of left knee, not elsewhere classified: Secondary | ICD-10-CM | POA: Diagnosis not present

## 2021-06-16 DIAGNOSIS — M25562 Pain in left knee: Secondary | ICD-10-CM | POA: Diagnosis not present

## 2021-06-21 DIAGNOSIS — N183 Chronic kidney disease, stage 3 unspecified: Secondary | ICD-10-CM | POA: Diagnosis not present

## 2021-06-21 DIAGNOSIS — F339 Major depressive disorder, recurrent, unspecified: Secondary | ICD-10-CM | POA: Diagnosis not present

## 2021-06-21 DIAGNOSIS — M8580 Other specified disorders of bone density and structure, unspecified site: Secondary | ICD-10-CM | POA: Diagnosis not present

## 2021-06-21 DIAGNOSIS — K219 Gastro-esophageal reflux disease without esophagitis: Secondary | ICD-10-CM | POA: Diagnosis not present

## 2021-06-21 DIAGNOSIS — D500 Iron deficiency anemia secondary to blood loss (chronic): Secondary | ICD-10-CM | POA: Diagnosis not present

## 2021-06-21 DIAGNOSIS — I10 Essential (primary) hypertension: Secondary | ICD-10-CM | POA: Diagnosis not present

## 2021-06-21 DIAGNOSIS — E039 Hypothyroidism, unspecified: Secondary | ICD-10-CM | POA: Diagnosis not present

## 2021-06-21 DIAGNOSIS — G4700 Insomnia, unspecified: Secondary | ICD-10-CM | POA: Diagnosis not present

## 2021-06-21 DIAGNOSIS — E785 Hyperlipidemia, unspecified: Secondary | ICD-10-CM | POA: Diagnosis not present

## 2021-06-22 DIAGNOSIS — M25562 Pain in left knee: Secondary | ICD-10-CM | POA: Diagnosis not present

## 2021-06-22 DIAGNOSIS — M6281 Muscle weakness (generalized): Secondary | ICD-10-CM | POA: Diagnosis not present

## 2021-06-22 DIAGNOSIS — Z96652 Presence of left artificial knee joint: Secondary | ICD-10-CM | POA: Diagnosis not present

## 2021-06-22 DIAGNOSIS — M25662 Stiffness of left knee, not elsewhere classified: Secondary | ICD-10-CM | POA: Diagnosis not present

## 2021-06-28 DIAGNOSIS — Z96652 Presence of left artificial knee joint: Secondary | ICD-10-CM | POA: Diagnosis not present

## 2021-06-28 DIAGNOSIS — M6281 Muscle weakness (generalized): Secondary | ICD-10-CM | POA: Diagnosis not present

## 2021-06-28 DIAGNOSIS — M25662 Stiffness of left knee, not elsewhere classified: Secondary | ICD-10-CM | POA: Diagnosis not present

## 2021-06-28 DIAGNOSIS — M25562 Pain in left knee: Secondary | ICD-10-CM | POA: Diagnosis not present

## 2021-06-30 DIAGNOSIS — M25562 Pain in left knee: Secondary | ICD-10-CM | POA: Diagnosis not present

## 2021-06-30 DIAGNOSIS — M6281 Muscle weakness (generalized): Secondary | ICD-10-CM | POA: Diagnosis not present

## 2021-06-30 DIAGNOSIS — M25662 Stiffness of left knee, not elsewhere classified: Secondary | ICD-10-CM | POA: Diagnosis not present

## 2021-06-30 DIAGNOSIS — Z96652 Presence of left artificial knee joint: Secondary | ICD-10-CM | POA: Diagnosis not present

## 2021-07-05 DIAGNOSIS — M25662 Stiffness of left knee, not elsewhere classified: Secondary | ICD-10-CM | POA: Diagnosis not present

## 2021-07-05 DIAGNOSIS — Z96652 Presence of left artificial knee joint: Secondary | ICD-10-CM | POA: Diagnosis not present

## 2021-07-05 DIAGNOSIS — M25562 Pain in left knee: Secondary | ICD-10-CM | POA: Diagnosis not present

## 2021-07-05 DIAGNOSIS — M6281 Muscle weakness (generalized): Secondary | ICD-10-CM | POA: Diagnosis not present

## 2021-07-07 DIAGNOSIS — M25662 Stiffness of left knee, not elsewhere classified: Secondary | ICD-10-CM | POA: Diagnosis not present

## 2021-07-07 DIAGNOSIS — Z96652 Presence of left artificial knee joint: Secondary | ICD-10-CM | POA: Diagnosis not present

## 2021-07-07 DIAGNOSIS — M6281 Muscle weakness (generalized): Secondary | ICD-10-CM | POA: Diagnosis not present

## 2021-07-07 DIAGNOSIS — M25562 Pain in left knee: Secondary | ICD-10-CM | POA: Diagnosis not present

## 2021-07-11 DIAGNOSIS — R7301 Impaired fasting glucose: Secondary | ICD-10-CM | POA: Diagnosis not present

## 2021-07-11 DIAGNOSIS — Z23 Encounter for immunization: Secondary | ICD-10-CM | POA: Diagnosis not present

## 2021-07-11 DIAGNOSIS — I10 Essential (primary) hypertension: Secondary | ICD-10-CM | POA: Diagnosis not present

## 2021-07-11 DIAGNOSIS — E039 Hypothyroidism, unspecified: Secondary | ICD-10-CM | POA: Diagnosis not present

## 2021-07-11 DIAGNOSIS — E785 Hyperlipidemia, unspecified: Secondary | ICD-10-CM | POA: Diagnosis not present

## 2021-07-11 DIAGNOSIS — N183 Chronic kidney disease, stage 3 unspecified: Secondary | ICD-10-CM | POA: Diagnosis not present

## 2021-07-12 DIAGNOSIS — Z4789 Encounter for other orthopedic aftercare: Secondary | ICD-10-CM | POA: Diagnosis not present

## 2021-07-14 DIAGNOSIS — M25662 Stiffness of left knee, not elsewhere classified: Secondary | ICD-10-CM | POA: Diagnosis not present

## 2021-07-14 DIAGNOSIS — M6281 Muscle weakness (generalized): Secondary | ICD-10-CM | POA: Diagnosis not present

## 2021-07-14 DIAGNOSIS — M25562 Pain in left knee: Secondary | ICD-10-CM | POA: Diagnosis not present

## 2021-07-14 DIAGNOSIS — Z96652 Presence of left artificial knee joint: Secondary | ICD-10-CM | POA: Diagnosis not present

## 2021-07-19 DIAGNOSIS — M25562 Pain in left knee: Secondary | ICD-10-CM | POA: Diagnosis not present

## 2021-07-19 DIAGNOSIS — M6281 Muscle weakness (generalized): Secondary | ICD-10-CM | POA: Diagnosis not present

## 2021-07-19 DIAGNOSIS — M25662 Stiffness of left knee, not elsewhere classified: Secondary | ICD-10-CM | POA: Diagnosis not present

## 2021-07-19 DIAGNOSIS — Z96652 Presence of left artificial knee joint: Secondary | ICD-10-CM | POA: Diagnosis not present

## 2021-07-20 DIAGNOSIS — F331 Major depressive disorder, recurrent, moderate: Secondary | ICD-10-CM | POA: Diagnosis not present

## 2021-07-20 DIAGNOSIS — F419 Anxiety disorder, unspecified: Secondary | ICD-10-CM | POA: Diagnosis not present

## 2021-07-21 DIAGNOSIS — M6281 Muscle weakness (generalized): Secondary | ICD-10-CM | POA: Diagnosis not present

## 2021-07-21 DIAGNOSIS — M25662 Stiffness of left knee, not elsewhere classified: Secondary | ICD-10-CM | POA: Diagnosis not present

## 2021-07-21 DIAGNOSIS — M25562 Pain in left knee: Secondary | ICD-10-CM | POA: Diagnosis not present

## 2021-07-21 DIAGNOSIS — Z96652 Presence of left artificial knee joint: Secondary | ICD-10-CM | POA: Diagnosis not present

## 2021-07-28 DIAGNOSIS — Z96652 Presence of left artificial knee joint: Secondary | ICD-10-CM | POA: Diagnosis not present

## 2021-07-28 DIAGNOSIS — F331 Major depressive disorder, recurrent, moderate: Secondary | ICD-10-CM | POA: Diagnosis not present

## 2021-07-28 DIAGNOSIS — M25662 Stiffness of left knee, not elsewhere classified: Secondary | ICD-10-CM | POA: Diagnosis not present

## 2021-07-28 DIAGNOSIS — M6281 Muscle weakness (generalized): Secondary | ICD-10-CM | POA: Diagnosis not present

## 2021-07-28 DIAGNOSIS — M25562 Pain in left knee: Secondary | ICD-10-CM | POA: Diagnosis not present

## 2021-08-02 DIAGNOSIS — Z96652 Presence of left artificial knee joint: Secondary | ICD-10-CM | POA: Diagnosis not present

## 2021-08-02 DIAGNOSIS — M6281 Muscle weakness (generalized): Secondary | ICD-10-CM | POA: Diagnosis not present

## 2021-08-02 DIAGNOSIS — M25562 Pain in left knee: Secondary | ICD-10-CM | POA: Diagnosis not present

## 2021-08-02 DIAGNOSIS — M25662 Stiffness of left knee, not elsewhere classified: Secondary | ICD-10-CM | POA: Diagnosis not present

## 2021-08-10 DIAGNOSIS — Z96652 Presence of left artificial knee joint: Secondary | ICD-10-CM | POA: Diagnosis not present

## 2021-08-10 DIAGNOSIS — M6281 Muscle weakness (generalized): Secondary | ICD-10-CM | POA: Diagnosis not present

## 2021-08-10 DIAGNOSIS — M25562 Pain in left knee: Secondary | ICD-10-CM | POA: Diagnosis not present

## 2021-08-10 DIAGNOSIS — M25662 Stiffness of left knee, not elsewhere classified: Secondary | ICD-10-CM | POA: Diagnosis not present

## 2021-08-11 DIAGNOSIS — F331 Major depressive disorder, recurrent, moderate: Secondary | ICD-10-CM | POA: Diagnosis not present

## 2021-08-11 DIAGNOSIS — F419 Anxiety disorder, unspecified: Secondary | ICD-10-CM | POA: Diagnosis not present

## 2021-08-15 DIAGNOSIS — F331 Major depressive disorder, recurrent, moderate: Secondary | ICD-10-CM | POA: Diagnosis not present

## 2021-09-01 DIAGNOSIS — Z20822 Contact with and (suspected) exposure to covid-19: Secondary | ICD-10-CM | POA: Diagnosis not present

## 2021-09-08 DIAGNOSIS — M7551 Bursitis of right shoulder: Secondary | ICD-10-CM | POA: Diagnosis not present

## 2021-09-08 DIAGNOSIS — M810 Age-related osteoporosis without current pathological fracture: Secondary | ICD-10-CM | POA: Diagnosis not present

## 2021-09-08 DIAGNOSIS — M79601 Pain in right arm: Secondary | ICD-10-CM | POA: Diagnosis not present

## 2021-09-08 DIAGNOSIS — Z7189 Other specified counseling: Secondary | ICD-10-CM | POA: Diagnosis not present

## 2021-10-17 DIAGNOSIS — F419 Anxiety disorder, unspecified: Secondary | ICD-10-CM | POA: Diagnosis not present

## 2021-10-17 DIAGNOSIS — F331 Major depressive disorder, recurrent, moderate: Secondary | ICD-10-CM | POA: Diagnosis not present

## 2021-10-19 DIAGNOSIS — F331 Major depressive disorder, recurrent, moderate: Secondary | ICD-10-CM | POA: Diagnosis not present

## 2021-10-25 DIAGNOSIS — Z03818 Encounter for observation for suspected exposure to other biological agents ruled out: Secondary | ICD-10-CM | POA: Diagnosis not present

## 2021-10-25 DIAGNOSIS — B349 Viral infection, unspecified: Secondary | ICD-10-CM | POA: Diagnosis not present

## 2021-10-28 DIAGNOSIS — M5136 Other intervertebral disc degeneration, lumbar region: Secondary | ICD-10-CM | POA: Diagnosis not present

## 2021-10-28 DIAGNOSIS — W19XXXS Unspecified fall, sequela: Secondary | ICD-10-CM | POA: Diagnosis not present

## 2021-10-28 DIAGNOSIS — M5134 Other intervertebral disc degeneration, thoracic region: Secondary | ICD-10-CM | POA: Diagnosis not present

## 2021-10-28 DIAGNOSIS — N1831 Chronic kidney disease, stage 3a: Secondary | ICD-10-CM | POA: Diagnosis not present

## 2021-10-28 DIAGNOSIS — M4854XA Collapsed vertebra, not elsewhere classified, thoracic region, initial encounter for fracture: Secondary | ICD-10-CM | POA: Diagnosis not present

## 2021-10-28 DIAGNOSIS — R0781 Pleurodynia: Secondary | ICD-10-CM | POA: Diagnosis not present

## 2021-10-28 DIAGNOSIS — Y92009 Unspecified place in unspecified non-institutional (private) residence as the place of occurrence of the external cause: Secondary | ICD-10-CM | POA: Diagnosis not present

## 2021-11-11 DIAGNOSIS — H52223 Regular astigmatism, bilateral: Secondary | ICD-10-CM | POA: Diagnosis not present

## 2021-11-11 DIAGNOSIS — H02839 Dermatochalasis of unspecified eye, unspecified eyelid: Secondary | ICD-10-CM | POA: Diagnosis not present

## 2021-11-21 DIAGNOSIS — Z20822 Contact with and (suspected) exposure to covid-19: Secondary | ICD-10-CM | POA: Diagnosis not present

## 2021-11-30 DIAGNOSIS — Z23 Encounter for immunization: Secondary | ICD-10-CM | POA: Diagnosis not present

## 2021-12-07 DIAGNOSIS — F419 Anxiety disorder, unspecified: Secondary | ICD-10-CM | POA: Diagnosis not present

## 2021-12-07 DIAGNOSIS — F331 Major depressive disorder, recurrent, moderate: Secondary | ICD-10-CM | POA: Diagnosis not present

## 2021-12-20 DIAGNOSIS — Z20822 Contact with and (suspected) exposure to covid-19: Secondary | ICD-10-CM | POA: Diagnosis not present

## 2021-12-24 DIAGNOSIS — Z20822 Contact with and (suspected) exposure to covid-19: Secondary | ICD-10-CM | POA: Diagnosis not present

## 2021-12-29 DIAGNOSIS — Z20822 Contact with and (suspected) exposure to covid-19: Secondary | ICD-10-CM | POA: Diagnosis not present

## 2022-01-03 DIAGNOSIS — Z23 Encounter for immunization: Secondary | ICD-10-CM | POA: Diagnosis not present

## 2022-01-09 DIAGNOSIS — F331 Major depressive disorder, recurrent, moderate: Secondary | ICD-10-CM | POA: Diagnosis not present

## 2022-01-09 DIAGNOSIS — Z20822 Contact with and (suspected) exposure to covid-19: Secondary | ICD-10-CM | POA: Diagnosis not present

## 2022-01-15 ENCOUNTER — Encounter (HOSPITAL_BASED_OUTPATIENT_CLINIC_OR_DEPARTMENT_OTHER): Payer: Self-pay | Admitting: Emergency Medicine

## 2022-01-15 ENCOUNTER — Other Ambulatory Visit: Payer: Self-pay

## 2022-01-15 ENCOUNTER — Emergency Department (HOSPITAL_BASED_OUTPATIENT_CLINIC_OR_DEPARTMENT_OTHER)
Admission: EM | Admit: 2022-01-15 | Discharge: 2022-01-15 | Disposition: A | Discharge status: Home / Self Care | Payer: Medicare Other | Attending: Emergency Medicine | Admitting: Emergency Medicine

## 2022-01-15 DIAGNOSIS — R109 Unspecified abdominal pain: Secondary | ICD-10-CM | POA: Diagnosis not present

## 2022-01-15 DIAGNOSIS — Z5321 Procedure and treatment not carried out due to patient leaving prior to being seen by health care provider: Secondary | ICD-10-CM | POA: Insufficient documentation

## 2022-01-15 DIAGNOSIS — W19XXXA Unspecified fall, initial encounter: Secondary | ICD-10-CM | POA: Insufficient documentation

## 2022-01-15 DIAGNOSIS — M545 Low back pain, unspecified: Secondary | ICD-10-CM | POA: Insufficient documentation

## 2022-01-15 NOTE — ED Triage Notes (Signed)
Pt arrives pov, to triage in wheelchair, reports mechanical fall early am. Denies thinners. C/o left lower back pain r/t fall and abdominal pain since arriving to ED. ?

## 2022-01-15 NOTE — ED Notes (Signed)
Called for rooming, no answer by patient in waiting room. ?

## 2022-01-17 DIAGNOSIS — E039 Hypothyroidism, unspecified: Secondary | ICD-10-CM | POA: Diagnosis not present

## 2022-01-17 DIAGNOSIS — I10 Essential (primary) hypertension: Secondary | ICD-10-CM | POA: Diagnosis not present

## 2022-01-17 DIAGNOSIS — E785 Hyperlipidemia, unspecified: Secondary | ICD-10-CM | POA: Diagnosis not present

## 2022-01-17 DIAGNOSIS — F339 Major depressive disorder, recurrent, unspecified: Secondary | ICD-10-CM | POA: Diagnosis not present

## 2022-01-19 DIAGNOSIS — H2513 Age-related nuclear cataract, bilateral: Secondary | ICD-10-CM | POA: Diagnosis not present

## 2022-01-19 DIAGNOSIS — H25013 Cortical age-related cataract, bilateral: Secondary | ICD-10-CM | POA: Diagnosis not present

## 2022-01-19 DIAGNOSIS — H25043 Posterior subcapsular polar age-related cataract, bilateral: Secondary | ICD-10-CM | POA: Diagnosis not present

## 2022-01-19 DIAGNOSIS — H18413 Arcus senilis, bilateral: Secondary | ICD-10-CM | POA: Diagnosis not present

## 2022-01-19 DIAGNOSIS — H2512 Age-related nuclear cataract, left eye: Secondary | ICD-10-CM | POA: Diagnosis not present

## 2022-01-20 DIAGNOSIS — R0781 Pleurodynia: Secondary | ICD-10-CM | POA: Diagnosis not present

## 2022-01-20 DIAGNOSIS — M546 Pain in thoracic spine: Secondary | ICD-10-CM | POA: Diagnosis not present

## 2022-01-20 DIAGNOSIS — Y92009 Unspecified place in unspecified non-institutional (private) residence as the place of occurrence of the external cause: Secondary | ICD-10-CM | POA: Diagnosis not present

## 2022-01-20 DIAGNOSIS — S2232XA Fracture of one rib, left side, initial encounter for closed fracture: Secondary | ICD-10-CM | POA: Diagnosis not present

## 2022-01-20 DIAGNOSIS — W19XXXS Unspecified fall, sequela: Secondary | ICD-10-CM | POA: Diagnosis not present

## 2022-01-28 DIAGNOSIS — Z20822 Contact with and (suspected) exposure to covid-19: Secondary | ICD-10-CM | POA: Diagnosis not present

## 2022-01-30 DIAGNOSIS — Z20822 Contact with and (suspected) exposure to covid-19: Secondary | ICD-10-CM | POA: Diagnosis not present

## 2022-01-30 DIAGNOSIS — Z23 Encounter for immunization: Secondary | ICD-10-CM | POA: Diagnosis not present

## 2022-02-01 DIAGNOSIS — F331 Major depressive disorder, recurrent, moderate: Secondary | ICD-10-CM | POA: Diagnosis not present

## 2022-02-01 DIAGNOSIS — F419 Anxiety disorder, unspecified: Secondary | ICD-10-CM | POA: Diagnosis not present

## 2022-03-30 DIAGNOSIS — F419 Anxiety disorder, unspecified: Secondary | ICD-10-CM | POA: Diagnosis not present

## 2022-03-30 DIAGNOSIS — F3342 Major depressive disorder, recurrent, in full remission: Secondary | ICD-10-CM | POA: Diagnosis not present

## 2022-04-03 DIAGNOSIS — F331 Major depressive disorder, recurrent, moderate: Secondary | ICD-10-CM | POA: Diagnosis not present

## 2022-06-12 DIAGNOSIS — S91201A Unspecified open wound of right great toe with damage to nail, initial encounter: Secondary | ICD-10-CM | POA: Diagnosis not present

## 2022-06-29 DIAGNOSIS — F331 Major depressive disorder, recurrent, moderate: Secondary | ICD-10-CM | POA: Diagnosis not present

## 2022-07-11 DIAGNOSIS — E782 Mixed hyperlipidemia: Secondary | ICD-10-CM | POA: Diagnosis not present

## 2022-07-11 DIAGNOSIS — I10 Essential (primary) hypertension: Secondary | ICD-10-CM | POA: Diagnosis not present

## 2022-07-11 DIAGNOSIS — E039 Hypothyroidism, unspecified: Secondary | ICD-10-CM | POA: Diagnosis not present

## 2022-07-11 DIAGNOSIS — K219 Gastro-esophageal reflux disease without esophagitis: Secondary | ICD-10-CM | POA: Diagnosis not present

## 2022-07-11 DIAGNOSIS — Z6825 Body mass index (BMI) 25.0-25.9, adult: Secondary | ICD-10-CM | POA: Diagnosis not present

## 2022-07-11 DIAGNOSIS — H6122 Impacted cerumen, left ear: Secondary | ICD-10-CM | POA: Diagnosis not present

## 2022-07-11 DIAGNOSIS — F339 Major depressive disorder, recurrent, unspecified: Secondary | ICD-10-CM | POA: Diagnosis not present

## 2022-07-14 DIAGNOSIS — Z23 Encounter for immunization: Secondary | ICD-10-CM | POA: Diagnosis not present

## 2022-10-02 DIAGNOSIS — F331 Major depressive disorder, recurrent, moderate: Secondary | ICD-10-CM | POA: Diagnosis not present

## 2022-11-30 DIAGNOSIS — F331 Major depressive disorder, recurrent, moderate: Secondary | ICD-10-CM | POA: Diagnosis not present

## 2022-12-11 DIAGNOSIS — H52223 Regular astigmatism, bilateral: Secondary | ICD-10-CM | POA: Diagnosis not present

## 2022-12-11 DIAGNOSIS — H5213 Myopia, bilateral: Secondary | ICD-10-CM | POA: Diagnosis not present

## 2022-12-11 DIAGNOSIS — H2513 Age-related nuclear cataract, bilateral: Secondary | ICD-10-CM | POA: Diagnosis not present

## 2022-12-11 DIAGNOSIS — H524 Presbyopia: Secondary | ICD-10-CM | POA: Diagnosis not present

## 2022-12-19 DIAGNOSIS — R531 Weakness: Secondary | ICD-10-CM | POA: Diagnosis not present

## 2022-12-19 DIAGNOSIS — R262 Difficulty in walking, not elsewhere classified: Secondary | ICD-10-CM | POA: Diagnosis not present

## 2023-01-02 DIAGNOSIS — M5440 Lumbago with sciatica, unspecified side: Secondary | ICD-10-CM | POA: Diagnosis not present

## 2023-01-04 ENCOUNTER — Other Ambulatory Visit (HOSPITAL_BASED_OUTPATIENT_CLINIC_OR_DEPARTMENT_OTHER): Payer: Self-pay | Admitting: Neurosurgery

## 2023-01-04 DIAGNOSIS — M5440 Lumbago with sciatica, unspecified side: Secondary | ICD-10-CM

## 2023-01-06 ENCOUNTER — Ambulatory Visit (HOSPITAL_BASED_OUTPATIENT_CLINIC_OR_DEPARTMENT_OTHER): Payer: Medicare Other

## 2023-01-10 ENCOUNTER — Other Ambulatory Visit (HOSPITAL_BASED_OUTPATIENT_CLINIC_OR_DEPARTMENT_OTHER): Payer: Medicare Other

## 2023-01-18 DIAGNOSIS — H25013 Cortical age-related cataract, bilateral: Secondary | ICD-10-CM | POA: Diagnosis not present

## 2023-01-18 DIAGNOSIS — H2512 Age-related nuclear cataract, left eye: Secondary | ICD-10-CM | POA: Diagnosis not present

## 2023-01-18 DIAGNOSIS — H18413 Arcus senilis, bilateral: Secondary | ICD-10-CM | POA: Diagnosis not present

## 2023-01-18 DIAGNOSIS — H25043 Posterior subcapsular polar age-related cataract, bilateral: Secondary | ICD-10-CM | POA: Diagnosis not present

## 2023-01-18 DIAGNOSIS — H2513 Age-related nuclear cataract, bilateral: Secondary | ICD-10-CM | POA: Diagnosis not present

## 2023-01-20 ENCOUNTER — Ambulatory Visit (HOSPITAL_BASED_OUTPATIENT_CLINIC_OR_DEPARTMENT_OTHER): Payer: Medicare Other

## 2023-01-28 ENCOUNTER — Ambulatory Visit (HOSPITAL_BASED_OUTPATIENT_CLINIC_OR_DEPARTMENT_OTHER)
Admission: RE | Admit: 2023-01-28 | Discharge: 2023-01-28 | Disposition: A | Discharge status: Home / Self Care | Payer: Medicare Other | Source: Ambulatory Visit | Attending: Neurosurgery | Admitting: Neurosurgery

## 2023-01-28 ENCOUNTER — Other Ambulatory Visit (HOSPITAL_BASED_OUTPATIENT_CLINIC_OR_DEPARTMENT_OTHER): Payer: Medicare Other

## 2023-01-28 DIAGNOSIS — M4316 Spondylolisthesis, lumbar region: Secondary | ICD-10-CM | POA: Diagnosis not present

## 2023-01-28 DIAGNOSIS — M5440 Lumbago with sciatica, unspecified side: Secondary | ICD-10-CM | POA: Insufficient documentation

## 2023-01-28 DIAGNOSIS — M545 Low back pain, unspecified: Secondary | ICD-10-CM | POA: Diagnosis not present

## 2023-01-30 DIAGNOSIS — M5440 Lumbago with sciatica, unspecified side: Secondary | ICD-10-CM | POA: Diagnosis not present

## 2023-02-21 DIAGNOSIS — F331 Major depressive disorder, recurrent, moderate: Secondary | ICD-10-CM | POA: Diagnosis not present

## 2023-04-16 DIAGNOSIS — H52209 Unspecified astigmatism, unspecified eye: Secondary | ICD-10-CM | POA: Diagnosis not present

## 2023-04-16 DIAGNOSIS — H2512 Age-related nuclear cataract, left eye: Secondary | ICD-10-CM | POA: Diagnosis not present

## 2023-04-17 DIAGNOSIS — H2511 Age-related nuclear cataract, right eye: Secondary | ICD-10-CM | POA: Diagnosis not present

## 2023-04-30 DIAGNOSIS — H2511 Age-related nuclear cataract, right eye: Secondary | ICD-10-CM | POA: Diagnosis not present

## 2023-05-22 DIAGNOSIS — F339 Major depressive disorder, recurrent, unspecified: Secondary | ICD-10-CM | POA: Diagnosis not present

## 2023-05-22 DIAGNOSIS — E782 Mixed hyperlipidemia: Secondary | ICD-10-CM | POA: Diagnosis not present

## 2023-05-22 DIAGNOSIS — I10 Essential (primary) hypertension: Secondary | ICD-10-CM | POA: Diagnosis not present

## 2023-05-22 DIAGNOSIS — S8012XA Contusion of left lower leg, initial encounter: Secondary | ICD-10-CM | POA: Diagnosis not present

## 2023-05-22 DIAGNOSIS — S8992XA Unspecified injury of left lower leg, initial encounter: Secondary | ICD-10-CM | POA: Diagnosis not present

## 2023-05-22 DIAGNOSIS — E039 Hypothyroidism, unspecified: Secondary | ICD-10-CM | POA: Diagnosis not present

## 2023-05-22 DIAGNOSIS — Z6825 Body mass index (BMI) 25.0-25.9, adult: Secondary | ICD-10-CM | POA: Diagnosis not present

## 2023-05-22 DIAGNOSIS — K219 Gastro-esophageal reflux disease without esophagitis: Secondary | ICD-10-CM | POA: Diagnosis not present

## 2023-07-27 DIAGNOSIS — R5381 Other malaise: Secondary | ICD-10-CM | POA: Diagnosis not present

## 2023-07-27 DIAGNOSIS — I10 Essential (primary) hypertension: Secondary | ICD-10-CM | POA: Diagnosis not present

## 2023-07-27 DIAGNOSIS — E559 Vitamin D deficiency, unspecified: Secondary | ICD-10-CM | POA: Diagnosis not present

## 2023-07-27 DIAGNOSIS — N183 Chronic kidney disease, stage 3 unspecified: Secondary | ICD-10-CM | POA: Diagnosis not present

## 2023-07-27 DIAGNOSIS — F339 Major depressive disorder, recurrent, unspecified: Secondary | ICD-10-CM | POA: Diagnosis not present

## 2023-07-27 DIAGNOSIS — E785 Hyperlipidemia, unspecified: Secondary | ICD-10-CM | POA: Diagnosis not present

## 2023-07-27 DIAGNOSIS — M899 Disorder of bone, unspecified: Secondary | ICD-10-CM | POA: Diagnosis not present

## 2023-07-27 DIAGNOSIS — G4700 Insomnia, unspecified: Secondary | ICD-10-CM | POA: Diagnosis not present

## 2023-07-27 DIAGNOSIS — E039 Hypothyroidism, unspecified: Secondary | ICD-10-CM | POA: Diagnosis not present

## 2023-07-27 DIAGNOSIS — Z23 Encounter for immunization: Secondary | ICD-10-CM | POA: Diagnosis not present

## 2023-07-27 DIAGNOSIS — R7301 Impaired fasting glucose: Secondary | ICD-10-CM | POA: Diagnosis not present

## 2023-07-27 DIAGNOSIS — Z6827 Body mass index (BMI) 27.0-27.9, adult: Secondary | ICD-10-CM | POA: Diagnosis not present

## 2023-08-08 DIAGNOSIS — R5381 Other malaise: Secondary | ICD-10-CM | POA: Diagnosis not present

## 2023-08-08 DIAGNOSIS — R262 Difficulty in walking, not elsewhere classified: Secondary | ICD-10-CM | POA: Diagnosis not present

## 2023-08-08 DIAGNOSIS — M6281 Muscle weakness (generalized): Secondary | ICD-10-CM | POA: Diagnosis not present

## 2023-08-21 DIAGNOSIS — F331 Major depressive disorder, recurrent, moderate: Secondary | ICD-10-CM | POA: Diagnosis not present

## 2024-02-28 ENCOUNTER — Other Ambulatory Visit: Payer: Self-pay

## 2024-02-28 ENCOUNTER — Emergency Department (HOSPITAL_COMMUNITY)
Admission: EM | Admit: 2024-02-28 | Discharge: 2024-02-28 | Disposition: A | Discharge status: Skilled Nursing Facility | Attending: Emergency Medicine | Admitting: Emergency Medicine

## 2024-02-28 ENCOUNTER — Emergency Department (HOSPITAL_COMMUNITY)

## 2024-02-28 ENCOUNTER — Encounter (HOSPITAL_COMMUNITY): Payer: Self-pay | Admitting: Emergency Medicine

## 2024-02-28 DIAGNOSIS — S32019A Unspecified fracture of first lumbar vertebra, initial encounter for closed fracture: Secondary | ICD-10-CM

## 2024-02-28 DIAGNOSIS — S300XXA Contusion of lower back and pelvis, initial encounter: Secondary | ICD-10-CM | POA: Diagnosis not present

## 2024-02-28 DIAGNOSIS — W19XXXA Unspecified fall, initial encounter: Secondary | ICD-10-CM | POA: Diagnosis not present

## 2024-02-28 DIAGNOSIS — S3992XA Unspecified injury of lower back, initial encounter: Secondary | ICD-10-CM | POA: Diagnosis present

## 2024-02-28 LAB — CBC WITH DIFFERENTIAL/PLATELET
Abs Immature Granulocytes: 0.04 10*3/uL (ref 0.00–0.07)
Basophils Absolute: 0.1 10*3/uL (ref 0.0–0.1)
Basophils Relative: 0 %
Eosinophils Absolute: 0.3 10*3/uL (ref 0.0–0.5)
Eosinophils Relative: 3 %
HCT: 34.3 % — ABNORMAL LOW (ref 36.0–46.0)
Hemoglobin: 11.3 g/dL — ABNORMAL LOW (ref 12.0–15.0)
Immature Granulocytes: 0 %
Lymphocytes Relative: 6 %
Lymphs Abs: 0.6 10*3/uL — ABNORMAL LOW (ref 0.7–4.0)
MCH: 31.9 pg (ref 26.0–34.0)
MCHC: 32.9 g/dL (ref 30.0–36.0)
MCV: 96.9 fL (ref 80.0–100.0)
Monocytes Absolute: 0.5 10*3/uL (ref 0.1–1.0)
Monocytes Relative: 4 %
Neutro Abs: 9.8 10*3/uL — ABNORMAL HIGH (ref 1.7–7.7)
Neutrophils Relative %: 87 %
Platelets: 182 10*3/uL (ref 150–400)
RBC: 3.54 MIL/uL — ABNORMAL LOW (ref 3.87–5.11)
RDW: 13.1 % (ref 11.5–15.5)
WBC: 11.3 10*3/uL — ABNORMAL HIGH (ref 4.0–10.5)
nRBC: 0 % (ref 0.0–0.2)

## 2024-02-28 LAB — BASIC METABOLIC PANEL WITH GFR
Anion gap: 5 (ref 5–15)
BUN: 20 mg/dL (ref 8–23)
CO2: 26 mmol/L (ref 22–32)
Calcium: 9.4 mg/dL (ref 8.9–10.3)
Chloride: 106 mmol/L (ref 98–111)
Creatinine, Ser: 1.19 mg/dL — ABNORMAL HIGH (ref 0.44–1.00)
GFR, Estimated: 46 mL/min — ABNORMAL LOW (ref >60)
Glucose, Bld: 119 mg/dL — ABNORMAL HIGH (ref 70–99)
Potassium: 4.1 mmol/L (ref 3.5–5.1)
Sodium: 137 mmol/L (ref 135–145)

## 2024-02-28 MED ORDER — FENTANYL CITRATE PF 50 MCG/ML IJ SOSY
50.0000 ug | PREFILLED_SYRINGE | Freq: Once | INTRAMUSCULAR | Status: AC
  Administered 2024-02-28: 50 ug via INTRAVENOUS
  Filled 2024-02-28: qty 1

## 2024-02-28 MED ORDER — OXYCODONE HCL 5 MG PO TABS: 5.0000 mg | ORAL_TABLET | Freq: Four times a day (QID) | ORAL | 0 refills | Status: AC | PRN

## 2024-02-28 MED ORDER — OXYCODONE-ACETAMINOPHEN 5-325 MG PO TABS
2.0000 | ORAL_TABLET | Freq: Once | ORAL | Status: AC
  Administered 2024-02-28: 2 via ORAL
  Filled 2024-02-28: qty 2

## 2024-02-28 NOTE — Progress Notes (Signed)
 Orthopedic Tech Progress Note Patient Details:  Tiffany Gill Aug 07, 1941 [id number] [facility] TLSO applied to patient Patient ID: Tiffany Gill, female   DOB: 09-03-1941, 83 y.o.   MRN: [id number]  Tiffany Gill 02/28/2024, 10:24 AM

## 2024-02-28 NOTE — ED Provider Notes (Signed)
 Care assumed from [place], [place] at shift change.  Please see their note for further information. Physical Exam  BP 101/62   Pulse 92   Temp 97.7 F (36.5 C) (Axillary)   Resp (!) 29   Ht 5' (1.524 m)   Wt 55.8 kg   SpO2 91%   BMI 24.03 kg/m   Physical Exam Vitals and nursing note reviewed.  Constitutional:      General: She is not in acute distress.    Appearance: Normal appearance. She is normal weight. She is not toxic-appearing or diaphoretic.     Comments: Chronically ill appearing  HENT:     Head: Normocephalic and atraumatic.  Cardiovascular:     Rate and Rhythm: Normal rate.  Pulmonary:     Effort: Pulmonary effort is normal. No respiratory distress.  Musculoskeletal:        General: Normal range of motion.     Cervical back: Normal range of motion.  Skin:    General: Skin is warm and dry.     Comments: Large hematoma noted to the left buttock  Neurological:     General: No focal deficit present.     Mental Status: She is alert.  Psychiatric:        Mood and Affect: Mood normal.        Behavior: Behavior normal.     Procedures  Procedures  ED Course / MDM    Medical Decision Making Amount and/or Complexity of Data Reviewed Labs: ordered. Radiology: ordered.  Risk Prescription drug management.   Briefly: Patient from a skilled nursing facility for mechanical fall x 2 days ago.  Not on anticoagulation.  Reportedly has not been able to get out of bed since the fall and therefore was transported here for evaluation.  They report that her facility did an x-ray, however it was apparently not in the area that was hurting.  She has low back pain.  Plan: CT lumbar spine has resulted, shows   1. New inferior endplate fracture at L1 with 20% loss of height and a posteriorly projecting fragment, without focal stenosis.  Also had portable pelvis ordered, however patient did not feel her pain was controlled enough at that time to tolerate this imaging.  This  x-ray is pending at shift change.  Also given concern for difficulties with mobility potentially requiring admission, labs were ordered and are pending.  8:00 AM: X-ray has resulted and reveals no acute findings.  I personally reviewed and interpreted this imaging and agree with radiology interpretation.  Patients labs show WBC 11.3, likely reactive due to trauma. Creatinine at baseline.   TLSO ordered for L1 fracture. Will attempt ambulation after this has been placed  11:00 AM: Patient has been placed in TLSO, does note this helps her pain. She was able to stand but was unable to walk. However, she is in skilled nursing, they should be able to manage like this and facilitate PT/OT and other needs she has. Discussed same with patient who is understanding, she would like to go home. She does have a neurosurgeon Dr. Lamon Pillow that she has followed with previously for her other spinal wounds.  Recommend that she reach back out to them for follow-up management of this new fracture.  Will send for a few doses of oxycodone  as needed for pain.  Emphasized importance of using this for breakthrough pain only and discussed thoroughly increased fall risk associated with this medicine. Evaluation and diagnostic testing in the emergency department does  not suggest an emergent condition requiring admission or immediate intervention beyond what has been performed at this time.  Plan for discharge with close PCP follow-up.  Patient is understanding and amenable with plan, educated on red flag symptoms that would prompt immediate return.  Patient discharged in stable condition.    Sherra Dk, PA-C 02/28/24 1129    Rolinda Climes, DO 02/28/24 1529

## 2024-02-28 NOTE — ED Triage Notes (Signed)
 BIB [facility] from "[facility]" facility. Fell x 2 days ago, was unwitnessed. Rt hip/sacral pain. Xray low back/spine. Bruising noted to rt buttock.   5/10 pain when not moving. 10/10 pain when moved. EMS reports NV checks WNL. No thinners.

## 2024-02-28 NOTE — ED Notes (Signed)
 Tested patients mobility, patient able to sit up with 2 person assist on either side with limited pain. Patient not able to go any farther than a sitting position

## 2024-02-28 NOTE — Discharge Instructions (Signed)
 As we discussed, CT imaging of your spine showed that you have a fracture in your first lumbar vertebrae of your lower back.  This is likely why you are having so much pain.  We have placed you in a back brace that you can wear for support.  You may take it off to shower.  I recommend that you follow-up with your neurosurgeon Dr. Lamon Pillow for continued evaluation and management of this injury.  I recommend that you rest in a position of comfort and take Tylenol  as needed for pain.  I have also given you a prescription for a few doses of oxycodone  which is a narcotic pain medication you can take as prescribed as needed for management of severe breakthrough pain.  Do not drive or operate heavy machinery while taking this medication and please be advised of the increased fall risk associated with these medicines and exercise extreme caution when taking them.  Follow-up with your PCP as well.  Return if development of any new or worsening symptoms.

## 2024-02-28 NOTE — ED Provider Notes (Signed)
 [facility] Emergency Department Provider Note MRN:  [id number]  Arrival date & time: 02/28/24     Chief Complaint   Fall   History of Present Illness   Tiffany Gill is a 83 y.o. year-old female presents to the ED with chief complaint of fall.  She states that she fell 2 days ago.  She was evaluated by the nurses at the facility.  Had x-rays, but does not have the reports back.  She denies any head injury, chest pain, shortness of breath, or abdominal pain.  States that she has not been able to walk because of the pain.  She states that she has big bruises on her buttocks. She is not anticoagulated.Aaron Aas  History provided by patient.   Review of Systems  Pertinent positive and negative review of systems noted in HPI.    Physical Exam   Vitals:   02/28/24 0449  BP: (!) 150/79  Pulse: 98  Resp: 18  Temp: 97.9 F (36.6 C)  SpO2: 98%    CONSTITUTIONAL:  non toxic-appearing, NAD NEURO:  Alert and oriented x 3, CN 3-12 grossly intact EYES:  eyes equal and reactive ENT/NECK:  Supple, no stridor  CARDIO:  normal rate, regular rhythm, appears well-perfused  PULM:  No respiratory distress, CTAB GI/GU:  non-distended,  MSK/SPINE:  No gross deformities, no edema, moves all extremities  SKIN:  no rash, hematoma to buttock   *Additional and/or pertinent findings included in MDM below  Diagnostic and Interventional Summary    EKG Interpretation Date/Time:    Ventricular Rate:    PR Interval:    QRS Duration:    QT Interval:    QTC Calculation:   R Axis:      Text Interpretation:         Labs Reviewed - No data to display  DG Pelvis Portable    (Results Pending)  CT Lumbar Spine Wo Contrast    (Results Pending)    Medications - No data to display   Procedures  /  Critical Care Procedures  ED Course and Medical Decision Making  I have reviewed the triage vital signs, the nursing notes, and pertinent available records from the  EMR.  Social Determinants Affecting Complexity of Care: Patient has no clinically significant social determinants affecting this chief complaint..   ED Course:    Medical Decision Making Patient here after a fall 2 days ago.  She fell and landed on her buttocks.  She complains of low back and butt pain.  She does have a hematoma and some midline spine tenderness.  Will check CT and plain films of the pelvis.  Will get basic labs.   Disposition pending imaging and ambulation.  Patient signed out to oncoming team, who will continue care.  Amount and/or Complexity of Data Reviewed Labs: ordered. Radiology: ordered.  Risk Prescription drug management.         Consultants:    Treatment and Plan: Patient signed out to oncoming team, who will continue care.   Plan: Follow-up on CT/xrays and labs.  Will need to ambulate.  If unable to do so, many need admission vs PT/OT.    Final Clinical Impressions(s) / ED Diagnoses  No diagnosis found.  ED Discharge Orders     None         Discharge Instructions Discussed with and Provided to Patient:   Discharge Instructions   None      Sherel Dikes, PA-C 02/28/24 1914  Edson Graces, MD 02/28/24 [phone]

## 2024-04-04 ENCOUNTER — Other Ambulatory Visit (HOSPITAL_BASED_OUTPATIENT_CLINIC_OR_DEPARTMENT_OTHER): Payer: Self-pay | Admitting: Student

## 2024-04-04 DIAGNOSIS — S32010A Wedge compression fracture of first lumbar vertebra, initial encounter for closed fracture: Secondary | ICD-10-CM

## 2024-05-02 ENCOUNTER — Other Ambulatory Visit (HOSPITAL_BASED_OUTPATIENT_CLINIC_OR_DEPARTMENT_OTHER): Admitting: Radiology

## 2024-05-02 ENCOUNTER — Ambulatory Visit (HOSPITAL_BASED_OUTPATIENT_CLINIC_OR_DEPARTMENT_OTHER)
Admission: RE | Admit: 2024-05-02 | Discharge: 2024-05-02 | Disposition: A | Discharge status: Home / Self Care | Source: Ambulatory Visit | Attending: Student | Admitting: Student

## 2024-05-02 DIAGNOSIS — S32010A Wedge compression fracture of first lumbar vertebra, initial encounter for closed fracture: Secondary | ICD-10-CM | POA: Diagnosis present
